# Patient Record
Sex: Male | Born: 1950 | Race: White | Hispanic: No | Marital: Married | Smoking: Never smoker
Health system: Southern US, Community
[De-identification: ages and names within clinical notes are randomized; demographics above are authoritative.]

## PROBLEM LIST (undated history)

## (undated) DIAGNOSIS — I712 Thoracic aortic aneurysm, without rupture: Secondary | ICD-10-CM

## (undated) DIAGNOSIS — K648 Other hemorrhoids: Secondary | ICD-10-CM

## (undated) DIAGNOSIS — M1990 Unspecified osteoarthritis, unspecified site: Secondary | ICD-10-CM

## (undated) DIAGNOSIS — N529 Male erectile dysfunction, unspecified: Secondary | ICD-10-CM

## (undated) DIAGNOSIS — H269 Unspecified cataract: Secondary | ICD-10-CM

## (undated) DIAGNOSIS — K5730 Diverticulosis of large intestine without perforation or abscess without bleeding: Secondary | ICD-10-CM

## (undated) DIAGNOSIS — I10 Essential (primary) hypertension: Secondary | ICD-10-CM

## (undated) DIAGNOSIS — I4891 Unspecified atrial fibrillation: Secondary | ICD-10-CM

## (undated) DIAGNOSIS — M19019 Primary osteoarthritis, unspecified shoulder: Secondary | ICD-10-CM

## (undated) DIAGNOSIS — N200 Calculus of kidney: Secondary | ICD-10-CM

## (undated) DIAGNOSIS — J61 Pneumoconiosis due to asbestos and other mineral fibers: Secondary | ICD-10-CM

## (undated) DIAGNOSIS — N492 Inflammatory disorders of scrotum: Secondary | ICD-10-CM

## (undated) DIAGNOSIS — R600 Localized edema: Secondary | ICD-10-CM

## (undated) DIAGNOSIS — I7121 Aneurysm of the ascending aorta, without rupture: Secondary | ICD-10-CM

## (undated) HISTORY — DX: Male erectile dysfunction, unspecified: N52.9

## (undated) HISTORY — DX: Diverticulosis of large intestine without perforation or abscess without bleeding: K57.30

## (undated) HISTORY — DX: Pneumoconiosis due to asbestos and other mineral fibers: J61

## (undated) HISTORY — PX: TONSILLECTOMY: SHX5217

## (undated) HISTORY — DX: Essential (primary) hypertension: I10

## (undated) HISTORY — DX: Calculus of kidney: N20.0

## (undated) HISTORY — DX: Inflammatory disorders of scrotum: N49.2

## (undated) HISTORY — DX: Unspecified atrial fibrillation: I48.91

## (undated) HISTORY — DX: Localized edema: R60.0

## (undated) HISTORY — DX: Other hemorrhoids: K64.8

---

## 1969-12-03 HISTORY — PX: CHOLECYSTECTOMY: SHX55

## 1989-08-03 HISTORY — PX: OTHER SURGICAL HISTORY: SHX169

## 1998-12-30 ENCOUNTER — Ambulatory Visit (HOSPITAL_COMMUNITY): Admission: RE | Admit: 1998-12-30 | Discharge: 1998-12-30 | Payer: Self-pay | Admitting: Internal Medicine

## 2002-01-31 DIAGNOSIS — N492 Inflammatory disorders of scrotum: Secondary | ICD-10-CM

## 2002-01-31 HISTORY — DX: Inflammatory disorders of scrotum: N49.2

## 2007-11-03 HISTORY — PX: OTHER SURGICAL HISTORY: SHX169

## 2008-01-05 ENCOUNTER — Ambulatory Visit: Payer: Self-pay | Admitting: Family Medicine

## 2008-01-05 DIAGNOSIS — L0390 Cellulitis, unspecified: Secondary | ICD-10-CM

## 2008-01-05 DIAGNOSIS — L0291 Cutaneous abscess, unspecified: Secondary | ICD-10-CM

## 2008-01-05 DIAGNOSIS — I872 Venous insufficiency (chronic) (peripheral): Secondary | ICD-10-CM

## 2008-01-05 DIAGNOSIS — I10 Essential (primary) hypertension: Secondary | ICD-10-CM

## 2008-01-06 ENCOUNTER — Encounter: Payer: Self-pay | Admitting: Family Medicine

## 2008-01-06 DIAGNOSIS — F528 Other sexual dysfunction not due to a substance or known physiological condition: Secondary | ICD-10-CM

## 2008-01-09 ENCOUNTER — Ambulatory Visit: Payer: Self-pay | Admitting: Family Medicine

## 2008-01-29 ENCOUNTER — Ambulatory Visit: Payer: Self-pay | Admitting: Family Medicine

## 2008-02-03 ENCOUNTER — Encounter: Payer: Self-pay | Admitting: Family Medicine

## 2008-02-13 ENCOUNTER — Telehealth: Payer: Self-pay | Admitting: Family Medicine

## 2008-03-16 ENCOUNTER — Ambulatory Visit: Payer: Self-pay | Admitting: Family Medicine

## 2008-03-19 ENCOUNTER — Telehealth: Payer: Self-pay | Admitting: Family Medicine

## 2008-03-26 ENCOUNTER — Telehealth: Payer: Self-pay | Admitting: Family Medicine

## 2008-04-12 ENCOUNTER — Ambulatory Visit: Payer: Self-pay | Admitting: Family Medicine

## 2008-04-27 ENCOUNTER — Ambulatory Visit: Payer: Self-pay | Admitting: Family Medicine

## 2008-04-27 ENCOUNTER — Encounter: Admission: RE | Admit: 2008-04-27 | Discharge: 2008-04-27 | Payer: Self-pay | Admitting: Family Medicine

## 2008-04-27 DIAGNOSIS — M25579 Pain in unspecified ankle and joints of unspecified foot: Secondary | ICD-10-CM

## 2008-04-29 LAB — CONVERTED CEMR LAB
ALT: 28 units/L (ref 0–53)
AST: 25 units/L (ref 0–37)
Cholesterol: 126 mg/dL (ref 0–200)
Creatinine, Ser: 0.93 mg/dL (ref 0.40–1.50)
HCT: 44 % (ref 39.0–52.0)
HDL: 40 mg/dL (ref >39)
MCV: 92.2 fL (ref 78.0–100.0)
Platelets: 249 10*3/uL (ref 150–400)
RDW: 13.1 % (ref 11.5–15.5)
TSH: 2.703 microintl units/mL (ref 0.350–5.50)
Total Bilirubin: 1.8 mg/dL — ABNORMAL HIGH (ref 0.3–1.2)
Total CHOL/HDL Ratio: 3.2
VLDL: 14 mg/dL (ref 0–40)

## 2008-05-10 ENCOUNTER — Ambulatory Visit: Payer: Self-pay | Admitting: Family Medicine

## 2008-05-10 DIAGNOSIS — E348 Other specified endocrine disorders: Secondary | ICD-10-CM | POA: Insufficient documentation

## 2008-05-25 ENCOUNTER — Telehealth: Payer: Self-pay | Admitting: Family Medicine

## 2008-05-26 ENCOUNTER — Ambulatory Visit: Payer: Self-pay | Admitting: Family Medicine

## 2008-05-26 DIAGNOSIS — I499 Cardiac arrhythmia, unspecified: Secondary | ICD-10-CM | POA: Insufficient documentation

## 2008-05-26 DIAGNOSIS — I498 Other specified cardiac arrhythmias: Secondary | ICD-10-CM

## 2008-05-27 ENCOUNTER — Encounter: Payer: Self-pay | Admitting: Family Medicine

## 2008-06-01 ENCOUNTER — Ambulatory Visit: Payer: Self-pay | Admitting: Family Medicine

## 2008-06-08 ENCOUNTER — Ambulatory Visit: Payer: Self-pay | Admitting: Family Medicine

## 2008-07-19 ENCOUNTER — Telehealth: Payer: Self-pay | Admitting: Family Medicine

## 2008-08-12 ENCOUNTER — Telehealth (INDEPENDENT_AMBULATORY_CARE_PROVIDER_SITE_OTHER): Payer: Self-pay | Admitting: *Deleted

## 2008-08-17 ENCOUNTER — Ambulatory Visit: Payer: Self-pay | Admitting: Family Medicine

## 2008-08-23 ENCOUNTER — Telehealth: Payer: Self-pay | Admitting: Family Medicine

## 2008-08-24 ENCOUNTER — Telehealth (INDEPENDENT_AMBULATORY_CARE_PROVIDER_SITE_OTHER): Payer: Self-pay | Admitting: *Deleted

## 2008-08-27 ENCOUNTER — Ambulatory Visit: Payer: Self-pay | Admitting: Family Medicine

## 2008-09-20 ENCOUNTER — Telehealth: Payer: Self-pay | Admitting: Family Medicine

## 2008-09-24 ENCOUNTER — Ambulatory Visit: Payer: Self-pay | Admitting: Family Medicine

## 2008-12-10 ENCOUNTER — Ambulatory Visit: Payer: Self-pay | Admitting: Family Medicine

## 2009-02-01 ENCOUNTER — Telehealth (INDEPENDENT_AMBULATORY_CARE_PROVIDER_SITE_OTHER): Payer: Self-pay | Admitting: Internal Medicine

## 2009-02-01 ENCOUNTER — Ambulatory Visit: Payer: Self-pay | Admitting: Family Medicine

## 2009-02-18 ENCOUNTER — Ambulatory Visit: Payer: Self-pay | Admitting: Family Medicine

## 2009-03-22 ENCOUNTER — Telehealth (INDEPENDENT_AMBULATORY_CARE_PROVIDER_SITE_OTHER): Payer: Self-pay | Admitting: *Deleted

## 2009-03-25 ENCOUNTER — Ambulatory Visit: Payer: Self-pay | Admitting: Family Medicine

## 2009-05-24 ENCOUNTER — Ambulatory Visit: Payer: Self-pay | Admitting: Family Medicine

## 2009-06-14 ENCOUNTER — Encounter: Payer: Self-pay | Admitting: Family Medicine

## 2009-06-15 ENCOUNTER — Ambulatory Visit: Payer: Self-pay | Admitting: Family Medicine

## 2009-06-15 LAB — CONVERTED CEMR LAB
Albumin: 3.8 g/dL (ref 3.5–5.2)
CO2: 24 meq/L (ref 19–32)
Calcium: 8.7 mg/dL (ref 8.4–10.5)
Chloride: 108 meq/L (ref 96–112)
Cholesterol: 117 mg/dL (ref 0–200)
Glucose, Bld: 108 mg/dL — ABNORMAL HIGH (ref 70–99)
Sodium: 144 meq/L (ref 135–145)
Total Bilirubin: 1.7 mg/dL — ABNORMAL HIGH (ref 0.3–1.2)
Total Protein: 6.4 g/dL (ref 6.0–8.3)
Triglycerides: 55 mg/dL (ref <150)
VLDL: 11 mg/dL (ref 0–40)

## 2009-11-07 ENCOUNTER — Ambulatory Visit: Payer: Self-pay | Admitting: Family Medicine

## 2009-11-14 ENCOUNTER — Ambulatory Visit: Payer: Self-pay | Admitting: Family Medicine

## 2009-11-22 ENCOUNTER — Ambulatory Visit: Payer: Self-pay | Admitting: Family Medicine

## 2010-03-07 ENCOUNTER — Ambulatory Visit: Payer: Self-pay | Admitting: Family Medicine

## 2010-03-08 ENCOUNTER — Telehealth: Payer: Self-pay | Admitting: Family Medicine

## 2010-03-10 ENCOUNTER — Encounter: Payer: Self-pay | Admitting: Family Medicine

## 2010-03-11 LAB — CONVERTED CEMR LAB
BUN: 22 mg/dL (ref 6–23)
CO2: 25 meq/L (ref 19–32)
Chloride: 107 meq/L (ref 96–112)
Glucose, Bld: 127 mg/dL — ABNORMAL HIGH (ref 70–99)
Potassium: 4.4 meq/L (ref 3.5–5.3)
Sodium: 140 meq/L (ref 135–145)

## 2010-07-27 ENCOUNTER — Telehealth (INDEPENDENT_AMBULATORY_CARE_PROVIDER_SITE_OTHER): Payer: Self-pay | Admitting: *Deleted

## 2010-07-27 ENCOUNTER — Ambulatory Visit: Payer: Self-pay | Admitting: Family Medicine

## 2010-07-31 ENCOUNTER — Telehealth: Payer: Self-pay | Admitting: Family Medicine

## 2010-09-11 ENCOUNTER — Telehealth: Payer: Self-pay | Admitting: Family Medicine

## 2010-10-18 ENCOUNTER — Ambulatory Visit: Payer: Self-pay | Admitting: Family Medicine

## 2010-10-19 ENCOUNTER — Encounter: Payer: Self-pay | Admitting: Family Medicine

## 2010-10-30 ENCOUNTER — Encounter (INDEPENDENT_AMBULATORY_CARE_PROVIDER_SITE_OTHER): Payer: Self-pay | Admitting: *Deleted

## 2011-01-02 ENCOUNTER — Encounter: Payer: Self-pay | Admitting: Family Medicine

## 2011-01-02 NOTE — Assessment & Plan Note (Signed)
Summary: Abscess, Venous stasis   Vital Signs:  Patient profile:   60 year old male Height:      73.3 inches Weight:      304 pounds Pulse rate:   60 / minute BP sitting:   129 / 69  (left arm) Cuff size:   large  Vitals Entered By: Kathlene November (March 07, 2010 3:24 PM) CC: red area on lower left side of abdomen present for 1 week   Primary Care Provider:  Nani Gasser MD  CC:  red area on lower left side of abdomen present for 1 week.  History of Present Illness: red area on lower left side of abdomen present for 1 week. Has drained some adn has squeezed it some. No fever.  Also his right leg has been very swollena nd now getting ulcerations. Thinks he may need an UNA boot again.  Has been taking his diuretic everyday.    Current Medications (verified): 1)  Fexofenadine Hcl 180 Mg  Tabs (Fexofenadine Hcl) .... Take 1 Tablet By Mouth Once A Day 2)  Furosemide 20 Mg  Tabs (Furosemide) .... Take 1 Tablet By Mouth Two Times A Day 3)  Tramadol Hcl 50 Mg  Tabs (Tramadol Hcl) .... Take 1 Tablet By Mouth Three Times A Day As Needed Pain 4)  Lisinopril 40 Mg  Tabs (Lisinopril) .... Take 1 Tablet By Mouth Once A Day 5)  Triamcinolone Acetonide 0.5 %  Crea (Triamcinolone Acetonide) .... Apply Two Times A Day To Affected Area 6)  Levitra 10 Mg  Tabs (Vardenafil Hcl) .... Take 1 Tablet By Mouth Once A Day 7)  Azor 10-40 Mg Tabs (Amlodipine-Olmesartan) .... Take 1 Tablet By Mouth Once A Day 8)  Coreg Cr 40 Mg Xr24h-Cap (Carvedilol Phosphate) .... Take 1 Tablet By Mouth Once A Day  Allergies (verified): 1)  ! Pcn  Comments:  Nurse/Medical Assistant: The patient's medications and allergies were reviewed with the patient and were updated in the Medication and Allergy Lists. Kathlene November (March 07, 2010 3:25 PM)  Physical Exam  General:  Well-developed,well-nourished,in no acute distress; alert,appropriate and cooperative throughout examination Msk:  Right leg wtih thick induration of  the lwer leg 1+ pitting edema adn a few approx 1cm ulceration.  Skin:  Right lower abdomen wiht 1 cm abscess with surrounding erythema about 10 cm around the border of eh abscess.    Impression & Recommendations:  Problem # 1:  ABSCESS, SKIN (ICD-682.9) I & Ds.  F/U wound car reveiwed. STart bactrim tonight. REmove packing 1 inch per day.  His updated medication list for this problem includes:    Bactrim Ds 800-160 Mg Tabs (Sulfamethoxazole-trimethoprim) .Marland Kitchen... Take 2 tablet by mouth two times a day for 10 days.  Orders: I&D Abscess, Simple / Single (10060)  Problem # 2:  UNSPECIFIED VENOUS INSUFFICIENCY (ICD-459.81) Will increase his diuretic but will need ti go in 2-3 days after increaseing to check his electrolytes to make sure stable. UNA boot applied today in the office. F/u in 1 week.  Orders: Henriette Combs, Application of ([address]) T-Basic Metabolic Panel [phone])  Complete Medication List: 1)  Fexofenadine Hcl 180 Mg Tabs (Fexofenadine hcl) .... Take 1 tablet by mouth once a day 2)  Furosemide 40 Mg Tabs (Furosemide) .... Take one a day or up to 2 x a day if needed for swelling. 3)  Tramadol Hcl 50 Mg Tabs (Tramadol hcl) .... Take 1 tablet by mouth three times a day as needed pain  4)  Lisinopril 40 Mg Tabs (Lisinopril) .... Take 1 tablet by mouth once a day 5)  Triamcinolone Acetonide 0.5 % Crea (Triamcinolone acetonide) .... Apply two times a day to affected area 6)  Levitra 10 Mg Tabs (Vardenafil hcl) .... Take 1 tablet by mouth once a day 7)  Azor 10-40 Mg Tabs (Amlodipine-olmesartan) .... Take 1 tablet by mouth once a day 8)  Coreg Cr 40 Mg Xr24h-cap (Carvedilol phosphate) .... Take 1 tablet by mouth once a day 9)  Bactrim Ds 800-160 Mg Tabs (Sulfamethoxazole-trimethoprim) .... Take 2 tablet by mouth two times a day for 10 days. 10)  Potassium Chloride Cr 10 Meq Cr-caps (Potassium chloride) .... Take 1 tablet by mouth once a day with fluid pill.  Patient Instructions: 1)   Increased your fluid pill but will need to start taking potassium pill with this. BOth sent to your pharmacy. Then recheck lab at the end of the week to make sure not losing too much potassium.   2)  Antibiotic for your abscess sent to the pharmacy.  3)  Follow up in one week to recheck your leg.  Prescriptions: POTASSIUM CHLORIDE CR 10 MEQ CR-CAPS (POTASSIUM CHLORIDE) Take 1 tablet by mouth once a day with fluid pill.  #30 x 0   Entered and Authorized by:   Nani Gasser MD   Signed by:   Nani Gasser MD on 03/07/2010   Method used:   Electronically to        [facility] (retail)       [address]       [place], [place]  [address]       Ph: [phone]       Fax: [phone]   RxID:   [id number] BACTRIM DS 800-160 MG TABS (SULFAMETHOXAZOLE-TRIMETHOPRIM) Take 2 tablet by mouth two times a day for 10 days.  #40 x 0   Entered and Authorized by:   Nani Gasser MD   Signed by:   Nani Gasser MD on 03/07/2010   Method used:   Electronically to        [facility] ([facility]ail)       [address]       [place], [place]  [address]       Ph: [phone]       Fax: [phone]   RxID:   [id number] FUROSEMIDE 40 MG TABS (FUROSEMIDE) Take one a day or up to 2 x a day if needed for swelling.  #60 x 1   Entered and Authorized by:   Nani Gasser MD   Signed by:   Nani Gasser MD on 03/07/2010   Method used:   Electronically to        [facility] ([facility]       [address]       [place], [place]  [address]       Ph: [phone]       Fax: [phone]   RxID:   [id number]    Procedure Note Last Tetanus: Tdap (06/15/2009)  Incision & Drainage: Date of onset: 03/02/2010 Indication: infected lesion  Procedure # 1: I & D with packing    Size (in cm): 2.0  x 3.0    Region: Left lower abdomen     Comment: Lidocaine with epi [id number]  Exp.03/2011    Instrument used: #11 blade    Anesthesia: 1% lidocaine  w/epinephrine  Cleaned and prepped with: sterile H20 and betadine Wound dressing:  bacitracin and bulky gauze dressing Additional Instructions: F/u wound care reviewed.

## 2011-01-02 NOTE — Assessment & Plan Note (Signed)
Summary: Legs are swollen, hurt and have sores   Vital Signs:  Patient profile:   60 year old male Height:      73.3 inches Weight:      285 pounds Pulse rate:   78 / minute BP sitting:   147 / 75  (left arm) Cuff size:   large  Vitals Entered By: Avon Gully CMA, Duncan Dull) (July 27, 2010 11:40 AM) CC: legs swollen    Primary Care Jerome Hicks:  Nani Gasser MD  CC:  legs swollen.  History of Present Illness: Has lost 20 lbs. Swelling worse as the day goes ont. 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  2 lesion on his left leg are drianing. Taking his lasix daily.   Current Medications (verified): 1)  Fexofenadine Hcl 180 Mg  Tabs (Fexofenadine Hcl) .... Take 1 Tablet By Mouth Once A Day 2)  Furosemide 40 Mg Tabs (Furosemide) .... Take One A Day or Up To 2 X A Day If Needed For Swelling. 3)  Tramadol Hcl 50 Mg  Tabs (Tramadol Hcl) .... Take 1 Tablet By Mouth Three Times A Day As Needed Pain 4)  Lisinopril 40 Mg  Tabs (Lisinopril) .... Take 1 Tablet By Mouth Once A Day 5)  Triamcinolone Acetonide 0.5 %  Crea (Triamcinolone Acetonide) .... Apply Two Times A Day To Affected Area 6)  Levitra 10 Mg  Tabs (Vardenafil Hcl) .... Take 1 Tablet By Mouth Once A Day 7)  Azor 10-40 Mg Tabs (Amlodipine-Olmesartan) .... Take 1 Tablet By Mouth Once A Day 8)  Coreg Cr 40 Mg Xr24h-Cap (Carvedilol Phosphate) .... Take 1 Tablet By Mouth Once A Day 9)  Potassium Chloride Cr 10 Meq Cr-Caps (Potassium Chloride) .... Take 1 Tablet By Mouth Once A Day With Fluid Pill.  Allergies (verified): 1)  ! Pcn  Comments:  Nurse/Medical Assistant: The patient's medications and allergies were reviewed with the patient and were updated in the Medication and Allergy Lists. Avon Gully CMA, Duncan Dull) (July 27, 2010 11:41 AM)  Physical Exam  General:  Well-developed,well-nourished,in no acute distress; alert,appropriate and cooperative  throughout examination Head:  Normocephalic and atraumatic without obvious abnormalities. No apparent alopecia or balding. Extremities:  2+ edema to belwoe the knee bilat. Has 2 draingin lesion on hisleft lower leg with spreading erythema.  No actual abscess to drain.    Impression & Recommendations:  Problem # 1:  CELLULITIS, LEG, LEFT (ICD-682.6) Will tx wtih bactrim.  Continue lasix and elevate legs as much as possible. Congratulated him on his weigh loss as this should help his venous stasis.  F/u if not resolving. If not healing then may need UNA boots.  Also discussed getting some compression socks since he really doesn't want to wear the compression stockings. He says he meay try them.  His updated medication list for this problem includes:    Bactrim Ds 800-160 Mg Tabs (Sulfamethoxazole-trimethoprim) .Marland Kitchen... Take 2 tablet by mouth two times a day for 10 days.  Problem # 2:  UNSPECIFIED VENOUS INSUFFICIENCY (ICD-459.81) Continue lasix and elevate legs as much as possible. Congratulated him on his weigh loss as this should help his venous stasis.  F/u if not resolving. Also discussed getting some compression socks since he really doesn't want to wear the compression stockings. He says he meay try them.   Problem # 3:  ESSENTIAL HYPERTENSION, BENIGN (ICD-401.1) Azor is now too expensive. Will try  changint to exforge. If still expensive then recommend change to losaratana and amlodipine separately.  His updated medication list for this problem includes:    Furosemide 40 Mg Tabs (Furosemide) .Marland Kitchen... Take one a day or up to 2 x a day if needed for swelling.    Lisinopril 40 Mg Tabs (Lisinopril) .Marland Kitchen... Take 1 tablet by mouth once a day    Exforge 10-160 Mg Tabs (Amlodipine besylate-valsartan) .Marland Kitchen... Take 1 tablet by mouth once a day    Coreg Cr 40 Mg Xr24h-cap (Carvedilol phosphate) .Marland Kitchen... Take 1 tablet by mouth once a day  Complete Medication List: 1)  Fexofenadine Hcl 180 Mg Tabs (Fexofenadine  hcl) .... Take 1 tablet by mouth once a day 2)  Furosemide 40 Mg Tabs (Furosemide) .... Take one a day or up to 2 x a day if needed for swelling. 3)  Tramadol Hcl 50 Mg Tabs (Tramadol hcl) .... Take 1 tablet by mouth three times a day as needed pain 4)  Lisinopril 40 Mg Tabs (Lisinopril) .... Take 1 tablet by mouth once a day 5)  Triamcinolone Acetonide 0.5 % Crea (Triamcinolone acetonide) .... Apply two times a day to affected area 6)  Levitra 10 Mg Tabs (Vardenafil hcl) .... Take 1 tablet by mouth once a day 7)  Exforge 10-160 Mg Tabs (Amlodipine besylate-valsartan) .... Take 1 tablet by mouth once a day 8)  Coreg Cr 40 Mg Xr24h-cap (Carvedilol phosphate) .... Take 1 tablet by mouth once a day 9)  Bactrim Ds 800-160 Mg Tabs (Sulfamethoxazole-trimethoprim) .... Take 2 tablet by mouth two times a day for 10 days. 10)  Potassium Chloride Cr 10 Meq Cr-caps (Potassium chloride) .... Take 1 tablet by mouth once a day with fluid pill.  Patient Instructions: 1)    Follow up for 2 montns for blood pressure.  2)  Call if the exforge is too expensive 3)  Can try the compression stockings at [facility].  Prescriptions: EXFORGE 10-160 MG TABS (AMLODIPINE BESYLATE-VALSARTAN) Take 1 tablet by mouth once a day  #30 x 0   Entered and Authorized by:   Nani Gasser MD   Signed by:   Nani Gasser MD on 07/27/2010   Method used:   Electronically to        [facility]* (retail)       [address] [place], [place]  [address]       Ph: [phone]       Fax: [phone]   RxID:   [id number] BACTRIM DS 800-160 MG TABS (SULFAMETHOXAZOLE-TRIMETHOPRIM) Take 2 tablet by mouth two times a day for 10 days.  #40 x 0   Entered and Authorized by:   Nani Gasser MD   Signed by:   Nani Gasser MD on 07/27/2010   Method used:   Electronically to        [facility]* (retail)       [address] [place], [place]  [address]       Ph: [phone]       Fax: [phone]    RxID:   [id number]

## 2011-01-02 NOTE — Letter (Signed)
Summary: Generic Letter  [facility]  [address]   [place], [place] [address]   Phone: [phone]  Fax: [phone]    10/30/2010  Jerome Hicks [address] [place], [place]  [address]  Dear Jerome Hicks,    We have been unable to reach you by phone. Your results showed that you have MRSA. Please start using your Hibiclens in the shower.       Sincerely,   Nani Gasser, MD

## 2011-01-02 NOTE — Progress Notes (Signed)
Summary: meds  Phone Note Call from Patient   Caller: Patient Summary of Call: Dr.Metheney   Call Back  [phone]  Patient was told to call back if the new meds that was called in...he said it cost to much and he want to know what to do.  Initial call taken by: Vanessa Swaziland,  July 31, 2010 1:42 PM  Follow-up for Phone Call        OK, split the med up so will have 2 rx.  Follow-up by: Nani Gasser MD,  July 31, 2010 2:16 PM  Additional Follow-up for Phone Call Additional follow up Details #1::        Pt notifeid med sent to pharmacy Additional Follow-up by: Kathlene November,  July 31, 2010 2:19 PM    New/Updated Medications: AMLODIPINE BESYLATE 10 MG TABS (AMLODIPINE BESYLATE) Take 1 tablet by mouth once a day LOSARTAN POTASSIUM 100 MG TABS (LOSARTAN POTASSIUM) Take 1 tablet by mouth once a day Prescriptions: LOSARTAN POTASSIUM 100 MG TABS (LOSARTAN POTASSIUM) Take 1 tablet by mouth once a day  #30 x 1   Entered and Authorized by:   Nani Gasser MD   Signed by:   Nani Gasser MD on 07/31/2010   Method used:   Electronically to        [facility]* (retail)       [address] [place], [place]  [address]       Ph: [phone]       Fax: [phone]   RxID:   [id number] AMLODIPINE BESYLATE 10 MG TABS (AMLODIPINE BESYLATE) Take 1 tablet by mouth once a day  #30 x 1   Entered and Authorized by:   Nani Gasser MD   Signed by:   Nani Gasser MD on 07/31/2010   Method used:   Electronically to        [facility]* (retail)       [address] [place], [place]  [address]       Ph: [phone]       Fax: [phone]   RxID:   [id number]

## 2011-01-02 NOTE — Progress Notes (Signed)
Summary: form  Phone Note Call from Patient Call back at [phone]   Caller: Daughter Summary of Call: called and states they dropped off a form for pt and it was not complete. no signature was on it wanted to know if there was any reason why it wasnt filled out or did it get overlooked Initial call taken by: Avon Gully CMA, Duncan Dull),  September 11, 2010 10:14 AM  Follow-up for Phone Call        I can't complete the form.  Follow-up by: Nani Gasser MD,  September 11, 2010 10:25 AM  Additional Follow-up for Phone Call Additional follow up Details #1::        this is the form that says on the front do not have your provider fill this out.called and left message for daughter Additional Follow-up by: Avon Gully CMA, Duncan Dull),  September 11, 2010 10:37 AM

## 2011-01-02 NOTE — Assessment & Plan Note (Signed)
Summary: Edema, abscess   Vital Signs:  Patient profile:   60 year old male Height:      73.3 inches Weight:      295 pounds Pulse rate:   70 / minute BP sitting:   138 / 68  (right arm) Cuff size:   large  Vitals Entered By: Avon Gully CMA, Duncan Dull) (October 18, 2010 8:47 AM) CC: MRSA rt inner leg   Primary Care Cyana Shook:  Nani Gasser MD  CC:  MRSA rt inner leg.  History of Present Illness: Legs have been much more swollen lately.  Has been taking his lasix two times a day and feels he has been getting a good response from it. Denies any fever. Now the skin is red and there are some lesions that are draining.  Teh area is tender.    Allergies: 1)  ! Pcn  Physical Exam  General:  Well-developed,well-nourished,in no acute distress; alert,appropriate and cooperative throughout examination Skin:  2+ pitting edema from the knees down.  His lower legs have a peau d'orange appearance and has some scattered pustules over this area.  A culture was obtained.    Impression & Recommendations:  Problem # 1:  UNSPECIFIED VENOUS INSUFFICIENCY (ICD-459.81) Assessment Deteriorated Give 80mg  of IM lasix today in the office. Increase oral lasix to three times a day for 4 days and then go back to two times a day REally watch the salt in your diet.  F/U in one week to recheck.   Problem # 2:  ABSCESS, SKIN (ICD-682.9) Assessment: Deteriorated  Culture was obtained. Tx with bactrim. Also given bactroban to use on areas of broken skin.  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  His updated medication list for this problem includes:    Bactrim Ds 800-160 Mg Tabs (Sulfamethoxazole-trimethoprim) .Marland Kitchen... Take 2 tablet by mouth two times a day for 10 days.  Orders: T-Culture,Abcess (87070/[id number])  Complete Medication List: 1)  Fexofenadine Hcl 180 Mg Tabs (Fexofenadine hcl) .... Take 1 tablet by mouth once a day 2)  Furosemide 40 Mg Tabs (Furosemide) .... Take one a day or  up to 2 x a day if needed for swelling. 3)  Tramadol Hcl 50 Mg Tabs (Tramadol hcl) .... Take 1 tablet by mouth three times a day as needed pain 4)  Lisinopril 40 Mg Tabs (Lisinopril) .... Take 1 tablet by mouth once a day 5)  Triamcinolone Acetonide 0.5 % Crea (Triamcinolone acetonide) .... Apply two times a day to affected area 6)  Levitra 10 Mg Tabs (Vardenafil hcl) .... Take 1 tablet by mouth once a day 7)  Amlodipine Besylate 10 Mg Tabs (Amlodipine besylate) .... Take 1 tablet by mouth once a day 8)  Coreg Cr 40 Mg Xr24h-cap (Carvedilol phosphate) .... Take 1 tablet by mouth once a day 9)  Bactrim Ds 800-160 Mg Tabs (Sulfamethoxazole-trimethoprim) .... Take 2 tablet by mouth two times a day for 10 days. 10)  Potassium Chloride Cr 10 Meq Cr-caps (Potassium chloride) .... Take 1 tablet by mouth once a day with fluid pill. 11)  Losartan Potassium 100 Mg Tabs (Losartan potassium) .... Take 1 tablet by mouth once a day 12)  Bactroban 2 % Oint (Mupirocin) .... Apply two times a day to affected area on skin  Other Orders: Admin of Therapeutic Inj ([facility] or [facility]) ([address]) Flu Vaccine 47yrs + ([address]) Admin 1st Vaccine ([address])  Patient Instructions: 1)   Increase oral lasix to three times a day for 4 days  and then go back to two times a day. 2)  REally what the salt in your diet.   3)  Call if not improving by Friday.  4)  Can use the ointment on teh areas of broken skin. 5)  Can also use ACE wraps for compression to help promote healing.  6)  Follow up in one week.  Prescriptions: BACTROBAN 2 % OINT (MUPIROCIN) Apply two times a day to affected area on skin  #1 tube x 1   Entered and Authorized by:   Nani Gasser MD   Signed by:   Nani Gasser MD on 10/18/2010   Method used:   Electronically to        [facility]* (retail)       [address] [place], [place]  [address]       Ph: [phone]       Fax: [phone]   RxID:   [id number] BACTRIM DS 800-160 MG TABS  (SULFAMETHOXAZOLE-TRIMETHOPRIM) Take 2 tablet by mouth two times a day for 10 days.  #40 x 0   Entered and Authorized by:   Nani Gasser MD   Signed by:   Nani Gasser MD on 10/18/2010   Method used:   Electronically to        [facility]* (retail)       [address] [place], [place]  [address]       Ph: [phone]       Fax: [phone]   RxID:   [id number]    Medication Administration  Injection # 1:    Medication: Furosemide- Lasix Injection    Route: IM    Site: RUOQ gluteus    Exp Date: 06/03/2011    Lot #: [id number]    Mfr: Hospira    Comments: 80 mg/ml given    Patient tolerated injection without complications    Given by: Avon Gully CMA, Duncan Dull) (October 18, 2010 8:49 AM)  Orders Added: 1)  T-Culture,Abcess [87070/[id number]] 2)  Admin of Therapeutic Inj ([facility] or [facility]) [[address]] 3)  Flu Vaccine 85yrs + [[address]] 4)  Admin 1st Vaccine [90471] 5)  Est. Patient Level IV [26948]   Immunizations Administered:  Influenza Vaccine # 1:    Vaccine Type: Fluvax 3+    Site: left deltoid    Mfr: GlaxoSmithKline    Dose: 0.5 ml    Route: IM    Given by: Sue Lush McCrimmon CMA, (AAMA)    Exp. Date: 06/02/2011    Lot #: [id number]    VIS given: 06/27/10 version given October 18, 2010.  Flu Vaccine Consent Questions:    Do you have a history of severe allergic reactions to this vaccine? no    Any prior history of allergic reactions to egg and/or gelatin? no    Do you have a sensitivity to the preservative Thimersol? no    Do you have a past history of Guillan-Barre Syndrome? no    Do you currently have an acute febrile illness? no    Have you ever had a severe reaction to latex? no    Vaccine information given and explained to patient? no   Immunizations Administered:  Influenza Vaccine # 1:    Vaccine Type: Fluvax 3+    Site: left deltoid    Mfr: GlaxoSmithKline    Dose: 0.5 ml    Route: IM    Given by: Avon Gully CMA, (AAMA)  Exp. Date: 06/02/2011    Lot #: [id number]    VIS given: 06/27/10 version given October 18, 2010.  Appended Document: Edema, abscess HPI: No trauma. No CP or SOB.

## 2011-01-02 NOTE — Progress Notes (Signed)
  Request recieved from [facility]DS sent to [facility]. Cala Bradford Mesiemore  July 27, 2010 8:44 AM

## 2011-01-02 NOTE — Progress Notes (Signed)
Summary: Remove packing?  Phone Note Call from Patient   Caller: Patient Summary of Call: Pt is doing well and having no problems. He would just like to know when he should remove packing.  Initial call taken by: Payton Spark CMA,  March 08, 2010 4:11 PM  Follow-up for Phone Call        REmove 1 inch a day until completely out.  Remind him to leave a tail on the string so he can easily pull on it the next day.  Follow-up by: Nani Gasser MD,  March 09, 2010 7:57 AM  Additional Follow-up for Phone Call Additional follow up Details #1::        Pt notified Additional Follow-up by: Kathlene November,  March 09, 2010 8:05 AM

## 2011-01-12 DIAGNOSIS — J61 Pneumoconiosis due to asbestos and other mineral fibers: Secondary | ICD-10-CM | POA: Insufficient documentation

## 2011-01-12 DIAGNOSIS — J984 Other disorders of lung: Secondary | ICD-10-CM | POA: Insufficient documentation

## 2011-01-30 NOTE — Letter (Signed)
Summary: [name]. Valorie Roosevelt, MD  [name]. Valorie Roosevelt, MD   Imported By: Maryln Gottron 01/22/2011 12:53:23  _____________________________________________________________________  External Attachment:    Type:   Image     Comment:   External Document

## 2011-03-16 ENCOUNTER — Encounter: Payer: Self-pay | Admitting: Family Medicine

## 2011-03-16 ENCOUNTER — Ambulatory Visit: Payer: Self-pay | Admitting: Family Medicine

## 2011-03-22 ENCOUNTER — Telehealth: Payer: Self-pay | Admitting: Family Medicine

## 2011-03-22 ENCOUNTER — Encounter: Payer: Self-pay | Admitting: Family Medicine

## 2011-03-22 ENCOUNTER — Ambulatory Visit (INDEPENDENT_AMBULATORY_CARE_PROVIDER_SITE_OTHER): Payer: Self-pay | Admitting: Family Medicine

## 2011-03-22 VITALS — BP 169/82 | HR 61 | Ht 73.3 in | Wt 290.0 lb

## 2011-03-22 DIAGNOSIS — E348 Other specified endocrine disorders: Secondary | ICD-10-CM

## 2011-03-22 DIAGNOSIS — L0390 Cellulitis, unspecified: Secondary | ICD-10-CM

## 2011-03-22 DIAGNOSIS — I878 Other specified disorders of veins: Secondary | ICD-10-CM

## 2011-03-22 DIAGNOSIS — I10 Essential (primary) hypertension: Secondary | ICD-10-CM

## 2011-03-22 DIAGNOSIS — L0291 Cutaneous abscess, unspecified: Secondary | ICD-10-CM

## 2011-03-22 DIAGNOSIS — I872 Venous insufficiency (chronic) (peripheral): Secondary | ICD-10-CM

## 2011-03-22 MED ORDER — AMLODIPINE-OLMESARTAN 10-40 MG PO TABS: 1.0000 | ORAL_TABLET | Freq: Every day | ORAL | Status: DC

## 2011-03-22 MED ORDER — TRAMADOL HCL 50 MG PO TABS: 50.0000 mg | ORAL_TABLET | Freq: Three times a day (TID) | ORAL | Status: DC | PRN

## 2011-03-22 MED ORDER — DOXAZOSIN MESYLATE 4 MG PO TABS: 4.0000 mg | ORAL_TABLET | Freq: Every day | ORAL | Status: DC

## 2011-03-22 MED ORDER — SULFAMETHOXAZOLE-TMP DS 800-160 MG PO TABS: 1.0000 | ORAL_TABLET | Freq: Two times a day (BID) | ORAL | Status: DC

## 2011-03-22 MED ORDER — FUROSEMIDE 40 MG PO TABS: 80.0000 mg | ORAL_TABLET | ORAL | Status: DC

## 2011-03-22 MED ORDER — POTASSIUM CHLORIDE ER 10 MEQ PO TBCR: 10.0000 meq | EXTENDED_RELEASE_TABLET | Freq: Every day | ORAL | Status: DC

## 2011-03-22 MED ORDER — CARVEDILOL PHOSPHATE ER 40 MG PO CP24: 40.0000 mg | ORAL_CAPSULE | Freq: Every day | ORAL | Status: DC

## 2011-03-22 NOTE — Telephone Encounter (Signed)
[facility] called request to know if Coreg medication can be changed to generic Coreg because the original request is too expensive.

## 2011-03-22 NOTE — Assessment & Plan Note (Addendum)
He is unsure about exactly what he is taking so I gave him an updated list today and he will call me tomorrow nad let me know exactly what he is taking so we can adjust his meds as he is clearly not well controlled today. Will have him f/u in one month for a CPE. Consider adding cardura.

## 2011-03-22 NOTE — Assessment & Plan Note (Signed)
Recheck. He has lost weight so hopefully this feels better.

## 2011-03-22 NOTE — Assessment & Plan Note (Signed)
Continue 80mg  of lasix daily. I don't feel his is volume overloaded. He is doing well and has actually lost weight.  Recommend ACE wrap for compression on the right lower leg for 10 days.

## 2011-03-22 NOTE — Telephone Encounter (Signed)
Yes Ok for generic.

## 2011-03-22 NOTE — Progress Notes (Signed)
  Subjective:    Patient ID: Jerome Hicks, male    DOB: 11-09-1951, 60 y.o.   MRN: [id number]  Hypertension This is a chronic problem. The current episode started more than 1 year ago. The problem has been gradually worsening since onset. Associated symptoms include peripheral edema. Pertinent negatives include no blurred vision, chest pain, palpitations or shortness of breath. There are no associated agents to hypertension. Risk factors for coronary artery disease include sedentary lifestyle, male gender and obesity. Past treatments include angiotensin blockers, diuretics and calcium channel blockers. The current treatment provides no improvement. There are no compliance problems.      RAsh and drainaing lesion on right lower legs for months  He says the leg is now very tender and sore  Also more swollen than his other legs.   + hx of mRSA.  Has been trying to lose weight. Using neosporin on it every other day for the last week.  Says he thinks it may be helping. No worsening sxs.  He is taking his meds including his lasix regularly.     Review of Systems  Eyes: Negative for blurred vision.  Respiratory: Negative for shortness of breath.   Cardiovascular: Negative for chest pain and palpitations.       Objective:   Physical Exam  Constitutional: He appears well-developed and well-nourished.  HENT:  Head: Normocephalic.  Cardiovascular: Normal rate, regular rhythm and normal heart sounds.   Pulmonary/Chest: Effort normal and breath sounds normal.  Musculoskeletal:       Right lower extremity with 1+ edema to the knee.  He has several white papules with surrounding erythema.  He left lower leg has 1+ edema to the mid -tibia with slight puprle discoloration.           Assessment & Plan:

## 2011-03-22 NOTE — Patient Instructions (Signed)
Stop th losartan and lisinopril  Take the Azor 10/40 and I will send over a new medication called cardura to help lower your blood pressure.

## 2011-03-23 ENCOUNTER — Telehealth: Payer: Self-pay | Admitting: *Deleted

## 2011-03-23 LAB — LIPID PANEL
HDL: 40 mg/dL (ref >39)
LDL Cholesterol: 74 mg/dL (ref 0–99)
Triglycerides: 46 mg/dL (ref <150)

## 2011-03-23 MED ORDER — CARVEDILOL 25 MG PO TABS: 25.0000 mg | ORAL_TABLET | Freq: Two times a day (BID) | ORAL | Status: DC

## 2011-03-23 NOTE — Telephone Encounter (Signed)
Outpatient: The wound culture was negative but please continue the antibiotic as well as wearing Ace wrap.

## 2011-03-23 NOTE — Telephone Encounter (Signed)
Change to Coreg 25mg  bid.

## 2011-03-23 NOTE — Telephone Encounter (Signed)
[facility] called and states the pt cannot afford coreg cr and the reg coreg comes in 12.5-25, 3.125 and 6.25 but not a 40mg  table which is what was rx'ed.Please [phone])

## 2011-03-24 LAB — COMPLETE METABOLIC PANEL WITH GFR
BUN: 12 mg/dL (ref 6–23)
CO2: 24 mEq/L (ref 19–32)
Calcium: 9.1 mg/dL (ref 8.4–10.5)
Chloride: 105 mEq/L (ref 96–112)
Creat: 0.9 mg/dL (ref 0.40–1.50)
GFR, Est African American: >60 mL/min (ref >60)
GFR, Est Non African American: >60 mL/min (ref >60)
Glucose, Bld: 116 mg/dL — ABNORMAL HIGH (ref 70–99)
Total Bilirubin: 2.2 mg/dL — ABNORMAL HIGH (ref 0.3–1.2)

## 2011-03-25 ENCOUNTER — Telehealth: Payer: Self-pay | Admitting: Family Medicine

## 2011-03-25 LAB — WOUND CULTURE
Gram Stain: NONE SEEN
Gram Stain: NONE SEEN
Gram Stain: NONE SEEN

## 2011-03-25 NOTE — Telephone Encounter (Signed)
Call pt: Wound grew out MRSA>

## 2011-03-26 NOTE — Telephone Encounter (Signed)
rx has been called in

## 2011-03-26 NOTE — Telephone Encounter (Signed)
rx has been changed to 25mg 

## 2011-03-26 NOTE — Telephone Encounter (Signed)
Pt notified via vm

## 2011-03-29 ENCOUNTER — Ambulatory Visit (INDEPENDENT_AMBULATORY_CARE_PROVIDER_SITE_OTHER): Payer: Self-pay | Admitting: Family Medicine

## 2011-03-29 DIAGNOSIS — R42 Dizziness and giddiness: Secondary | ICD-10-CM

## 2011-03-29 DIAGNOSIS — I10 Essential (primary) hypertension: Secondary | ICD-10-CM

## 2011-03-29 MED ORDER — MECLIZINE HCL 25 MG PO TABS: 25.0000 mg | ORAL_TABLET | Freq: Three times a day (TID) | ORAL | Status: DC | PRN

## 2011-03-29 MED ORDER — FUROSEMIDE 40 MG PO TABS: 40.0000 mg | ORAL_TABLET | ORAL | Status: DC

## 2011-03-29 NOTE — Patient Instructions (Signed)
Let me know if you are not better in 2 weeks Can start the exercises.   Skip your lasix today, then drop to only one a day.

## 2011-03-29 NOTE — Assessment & Plan Note (Signed)
Decrease Lasix to one tab and decrease Coreg to once a day.

## 2011-03-29 NOTE — Progress Notes (Signed)
  Subjective:    Patient ID: Jerome Hicks, male    DOB: 23-Nov-1951, 60 y.o.   MRN: [id number]  HPI It started about 5 days ago and feels "dizzy headed".  BEtter when he sits still. Worse when he moves. Yesterday broke out in a cold sweats. No fever or ear pain.  No sinus pressure or cold symptomsl. Tried an OTC product that he feels did help his sxs some.    Review of Systems     Objective:   Physical Exam  Constitutional: He is oriented to person, place, and time. He appears well-developed and well-nourished.  HENT:  Head: Normocephalic and atraumatic.  Right Ear: External ear normal.  Left Ear: External ear normal.  Nose: Nose normal.  Mouth/Throat: Oropharynx is clear and moist.  Eyes: Conjunctivae and EOM are normal. Pupils are equal, round, and reactive to light.  Neck: Neck supple. No thyromegaly present.  Cardiovascular: Normal rate, regular rhythm and normal heart sounds.   Pulmonary/Chest: Effort normal and breath sounds normal.  Musculoskeletal: He exhibits edema.  Lymphadenopathy:    He has no cervical adenopathy.  Neurological: He is oriented to person, place, and time. He has normal reflexes. No cranial nerve deficit. Coordination normal.       Positive Dix-Hallpike to the right  Skin: Skin is warm and dry.  Psychiatric: He has a normal mood and affect.          Assessment & Plan:  Vertigo. - reviewed dx.  Given H.O. Demonstrated exercises. Meclizine sent to pharm. Call if not better in 2 weeks.   BP is low. Dec lasix to once a day and dec coreg to once a day. Has f/u in 2 weeks.

## 2011-04-14 ENCOUNTER — Encounter: Payer: Self-pay | Admitting: Family Medicine

## 2011-04-16 ENCOUNTER — Ambulatory Visit (INDEPENDENT_AMBULATORY_CARE_PROVIDER_SITE_OTHER): Payer: Self-pay | Admitting: Family Medicine

## 2011-04-16 ENCOUNTER — Encounter: Payer: Self-pay | Admitting: Family Medicine

## 2011-04-16 DIAGNOSIS — L0291 Cutaneous abscess, unspecified: Secondary | ICD-10-CM

## 2011-04-16 DIAGNOSIS — Z0000 Encounter for general adult medical examination without abnormal findings: Secondary | ICD-10-CM

## 2011-04-16 DIAGNOSIS — L0390 Cellulitis, unspecified: Secondary | ICD-10-CM

## 2011-04-16 MED ORDER — CEPHALEXIN 500 MG PO CAPS: 500.0000 mg | ORAL_CAPSULE | Freq: Three times a day (TID) | ORAL | Status: AC

## 2011-04-16 NOTE — Assessment & Plan Note (Signed)
No active or draining wounds but ulcerations on her lower legs are just not healing. Will tx with keflex , home compression wraps and inc lasix ot 2 tabs daily for a couple of days an then drop back down to 1.  Continue to monitor salt in your diet.

## 2011-04-16 NOTE — Progress Notes (Signed)
Subjective:    Patient ID: Jerome Hicks, male    DOB: 1951-05-15, 60 y.o.   MRN: [id number]  HPI He has really cut back on his salt intake. He noticed his leg swelling has been worse for about a weeks.  He says his vertigo has resolved.     Review of Systems  Constitutional: Negative for fever, fatigue and unexpected weight change.  Respiratory: Negative for chest tightness, shortness of breath and wheezing.   Cardiovascular: Negative for chest pain.  Gastrointestinal: Negative for abdominal distention.  Musculoskeletal: Positive for joint swelling and arthralgias. Negative for back pain.  Neurological: Negative for dizziness and light-headedness.  Hematological: Negative for adenopathy.   BP 128/72  Ht 6' 1.3" (1.862 m)  Wt 297 lb (134.718 kg)  BMI 38.86 kg/m2    Allergies  Allergen Reactions  . Penicillins     Past Medical History  Diagnosis Date  . Kidney stones   . Asbestosis     get yearly PFT's  . ED (erectile dysfunction)     has been prescribed Levitra and Viagrain in the past  . Scrotal abscess 3-03  . Leg edema     recurrent cellulitis  . Internal hemorrhoids   . Sigmoid diverticulosis     Past Surgical History  Procedure Date  . Cholecystectomy 1971  . Kidney stones 1990's    Ureteroscopic stone extraction  .  mrsa infection in left groin requiring surgical debridement 12-08  . Tonsillectomy     History   Social History  . Marital Status: Married    Spouse Name: N/A    Number of Children: N/A  . Years of Education: N/A   Occupational History  . Not on file.   Social History Main Topics  . Smoking status: Never Smoker   . Smokeless tobacco: Not on file  . Alcohol Use: No  . Drug Use: No  . Sexually Active:    Other Topics Concern  . Not on file   Social History Narrative  . No narrative on file    Family History  Problem Relation Age of Onset  . Heart attack Mother 33  . Heart attack Father 46  . Heart attack Sister 2  .  Heart attack Brother 67    Current outpatient prescriptions:amLODipine-olmesartan (AZOR) 10-40 MG per tablet, Take 1 tablet by mouth daily., Disp: 30 tablet, Rfl: 1;  doxazosin (CARDURA) 4 MG tablet, Take 1 tablet (4 mg total) by mouth daily., Disp: 30 tablet, Rfl: 11;  furosemide (LASIX) 40 MG tablet, Take 1 tablet (40 mg total) by mouth as directed. Take one a day or up to 2 x a day prn for swelling, Disp: 90 tablet, Rfl: 3 meclizine (ANTIVERT) 25 MG tablet, Take 1 tablet (25 mg total) by mouth 3 (three) times daily as needed for dizziness or nausea., Disp: 30 tablet, Rfl: 1;  potassium chloride (K-DUR) 10 MEQ tablet, Take 1 tablet (10 mEq total) by mouth daily. Take w/ fluid pill, Disp: 30 tablet, Rfl: 11;  traMADol (ULTRAM) 50 MG tablet, Take 1 tablet (50 mg total) by mouth 3 (three) times daily as needed., Disp: 90 tablet, Rfl: 1 vardenafil (LEVITRA) 10 MG tablet, Take 10 mg by mouth daily.  , Disp: , Rfl: ;  DISCONTD: carvedilol (COREG) 25 MG tablet, Take 1 tablet (25 mg total) by mouth 2 (two) times daily., Disp: 60 tablet, Rfl: 11;  cephALEXin (KEFLEX) 500 MG capsule, Take 1 capsule (500 mg total) by mouth 3 (three) times  daily., Disp: 30 capsule, Rfl: 0     Objective:   Physical Exam  Constitutional: He is oriented to person, place, and time. He appears well-developed and well-nourished.  HENT:  Head: Normocephalic and atraumatic.  Right Ear: External ear normal.  Left Ear: External ear normal.  Nose: Nose normal.  Mouth/Throat: Oropharynx is clear and moist.  Eyes: Conjunctivae and EOM are normal. Pupils are equal, round, and reactive to light.  Neck: Normal range of motion. Neck supple.  Cardiovascular: Normal rate, regular rhythm and normal heart sounds.        No carotid or abdominal bruits.   Pulmonary/Chest: Effort normal and breath sounds normal.  Abdominal: Bowel sounds are normal. He exhibits no distension and no mass. There is no tenderness. There is no rebound and no  guarding.  Musculoskeletal: Normal range of motion. He exhibits no edema.  Neurological: He is alert and oriented to person, place, and time.  Skin: Skin is warm and dry.  Psychiatric: He has a normal mood and affect. His behavior is normal. Judgment and thought content normal.          Assessment & Plan:  CPE- Exam is normal today except for obesity.  He had done great with his weight loss and dietary changes. Hold the coreg and continue regular meds. Follow up in 2 months for repeat BP check. Already had labs drawn. Repeat in CMP in 6 months. Repeat lipids in 1 year.  Reviewed lab result with him. Colonoscopy, Tdap are up to date .  Discussed shingles vaccines. He agreed to it but we forgot to admin it before he left.   Still using his tramadol on average about twice  A day.

## 2011-04-16 NOTE — Patient Instructions (Signed)
Stop the coreg (carvedilol).  Recheck blood pressure in 2 months.

## 2011-06-18 ENCOUNTER — Encounter: Payer: Self-pay | Admitting: Family Medicine

## 2011-06-18 ENCOUNTER — Ambulatory Visit (INDEPENDENT_AMBULATORY_CARE_PROVIDER_SITE_OTHER): Payer: Self-pay | Admitting: Family Medicine

## 2011-06-18 VITALS — BP 152/90 | HR 62 | Ht 75.0 in | Wt 287.0 lb

## 2011-06-18 DIAGNOSIS — Z2911 Encounter for prophylactic immunotherapy for respiratory syncytial virus (RSV): Secondary | ICD-10-CM

## 2011-06-18 DIAGNOSIS — I10 Essential (primary) hypertension: Secondary | ICD-10-CM

## 2011-06-18 MED ORDER — CARVEDILOL 25 MG PO TABS: 25.0000 mg | ORAL_TABLET | Freq: Two times a day (BID) | ORAL | Status: DC

## 2011-06-18 MED ORDER — AMLODIPINE-OLMESARTAN 10-40 MG PO TABS: 1.0000 | ORAL_TABLET | Freq: Every day | ORAL | Status: DC

## 2011-06-18 NOTE — Progress Notes (Signed)
  Subjective:    Patient ID: Jerome Hicks, male    DOB: October 21, 1951, 60 y.o.   MRN: [id number]  HPI  He is still losing weight. Too meds this AM.  Working in his yard.  He has been trying to eat more healthfully. He has decreased his Lasix to once a day and only uses extra if he feels his leg swelling is worse.   Wants to know if have samples on the azor. She CP or dizziness, or HA.  Occ SOB from his asbetosis which is usually from climbing stairs. He is compliant with his medications.  Review of Systems     Objective:   Physical Exam  Constitutional: He is oriented to person, place, and time. He appears well-developed and well-nourished.  HENT:  Head: Normocephalic and atraumatic.  Eyes: Conjunctivae are normal. Pupils are equal, round, and reactive to light.  Cardiovascular: Normal rate, regular rhythm and normal heart sounds.   Pulmonary/Chest: Effort normal and breath sounds normal.  Musculoskeletal:       1+ ankle edema bilaterally.   Neurological: He is alert and oriented to person, place, and time.  Skin: Skin is warm and dry.  Psychiatric: He has a normal mood and affect.          Assessment & Plan:

## 2011-06-18 NOTE — Patient Instructions (Signed)
Restart your coreg (carvedilol).  Follow up in 2 months.

## 2011-06-18 NOTE — Assessment & Plan Note (Signed)
I would like him to restart the carvedilol. We stopped it when his last visit his blood pressure was so well controlled and he was losing weight fairly rapidly. I think he needs to restart it at this point and we will recheck his blood pressure in 4-6 weeks. Note he was given a shingles vaccine today.

## 2011-08-10 ENCOUNTER — Encounter: Payer: Self-pay | Admitting: Family Medicine

## 2011-08-20 ENCOUNTER — Ambulatory Visit (INDEPENDENT_AMBULATORY_CARE_PROVIDER_SITE_OTHER): Payer: Self-pay | Admitting: Family Medicine

## 2011-08-20 VITALS — BP 169/85 | HR 48 | Wt 293.0 lb

## 2011-08-20 DIAGNOSIS — Z23 Encounter for immunization: Secondary | ICD-10-CM

## 2011-08-20 MED ORDER — DOXAZOSIN MESYLATE 4 MG PO TABS: 4.0000 mg | ORAL_TABLET | Freq: Every day | ORAL | Status: DC

## 2011-08-20 MED ORDER — AMLODIPINE-OLMESARTAN 10-40 MG PO TABS: 1.0000 | ORAL_TABLET | Freq: Every day | ORAL | Status: DC

## 2011-08-20 NOTE — Progress Notes (Signed)
  Subjective:    Patient ID: Jerome Hicks, male    DOB: [birth year], 60 y.o.   MRN: [id number]  Hypertension This is a new problem. The current episode started more than 1 year ago. The problem is uncontrolled. Pertinent negatives include no chest pain or shortness of breath. There are no associated agents to hypertension. Risk factors for coronary artery disease include no known risk factors. Past treatments include angiotensin blockers, calcium channel blockers, diuretics and central alpha agonists. The current treatment provides moderate improvement. There are no compliance problems.   Ran out of Azor 2 weeks ago.      Review of Systems  Respiratory: Negative for shortness of breath.   Cardiovascular: Negative for chest pain.       Objective:   Physical Exam  Constitutional: He is oriented to person, place, and time. He appears well-developed and well-nourished.  HENT:  Head: Normocephalic and atraumatic.  Cardiovascular: Normal rate, regular rhythm and normal heart sounds.   Pulmonary/Chest: Effort normal and breath sounds normal.  Musculoskeletal: He exhibits edema.       1+ on the right, trace on the left. Overall looks much better.   Neurological: He is alert and oriented to person, place, and time.  Skin: Skin is warm and dry.  Psychiatric: He has a normal mood and affect. His behavior is normal.          Assessment & Plan:  HTN - Restart Azor. Samples given. His says his home BPs have been running in the 130/60s at home.  F.U in 4 months. LE edema looks better.    Flu vac given today.

## 2011-09-21 ENCOUNTER — Other Ambulatory Visit: Payer: Self-pay | Admitting: Family Medicine

## 2011-11-15 ENCOUNTER — Encounter: Payer: Self-pay | Admitting: Family Medicine

## 2011-11-20 ENCOUNTER — Ambulatory Visit (INDEPENDENT_AMBULATORY_CARE_PROVIDER_SITE_OTHER): Payer: Self-pay | Admitting: Family Medicine

## 2011-11-20 ENCOUNTER — Encounter: Payer: Self-pay | Admitting: Family Medicine

## 2011-11-20 DIAGNOSIS — I10 Essential (primary) hypertension: Secondary | ICD-10-CM

## 2011-11-20 DIAGNOSIS — M722 Plantar fascial fibromatosis: Secondary | ICD-10-CM

## 2011-11-20 MED ORDER — AMLODIPINE-OLMESARTAN 10-40 MG PO TABS: 1.0000 | ORAL_TABLET | Freq: Every day | ORAL | Status: DC

## 2011-11-20 NOTE — Progress Notes (Signed)
  Subjective:    Patient ID: Jerome Hicks, male    DOB: [birth year], 60 y.o.   MRN: [id number]  HPI Right heel pain x6 months. Worse in the a.m. when he first stands on it. He takes tramadol occasionally for his back and says he has been using this but has not really helped. He has not tried NSAID. He has a history of chronic venous stasis. He says that his lower extremity swelling has been a little worse lately so he has had to start his Lasix again. She says the pain and tenderness is rare that he'll. He tends to get better as the day progresses and then worse again at night. No known trauma.  Hypertension-he is doing well and taking his medications. He has managed to keep most of the weight off that he has lost. No chest pain or short of breath.   Review of Systems     Objective:   Physical Exam  Constitutional: He is oriented to person, place, and time. He appears well-developed and well-nourished.  HENT:  Head: Normocephalic and atraumatic.  Cardiovascular: Normal rate, regular rhythm and normal heart sounds.   Pulmonary/Chest: Effort normal and breath sounds normal.  Musculoskeletal: He exhibits edema.       2 + pittig ankel edema bilat.  Right ankel with NROM and strength 5/5. Tender over the base of teh calcaneus. No pain into the arch. No pain with dorsiflexion or plantar flexion. No erythema or bruising.   Neurological: He is alert and oriented to person, place, and time.  Skin: Skin is warm and dry.  Psychiatric: He has a normal mood and affect. His behavior is normal.          Assessment & Plan:  Plantar fasciitis versus heel spur. I gave him a handout on some gentle stretches to do. I also recommend an over-the-counter NSAID. He says he has ibuprofen at home. Encouraged him to take this with food and water and stop immediately if any GI upset. It is not a significant improvement in  4 weeks then please to call the office and we can refer him to podiatry for possible  injections and splinting. I did not perform an x-ray today.   Hypertension-well controlled today. Keep up the good work. Continue Lasix as needed for some extremity swelling. Continue to make sure he is eating a low salt diet. Hx of chronic venous stasis before starting amlodipine.

## 2011-12-20 ENCOUNTER — Other Ambulatory Visit: Payer: Self-pay | Admitting: Family Medicine

## 2012-02-26 ENCOUNTER — Other Ambulatory Visit: Payer: Self-pay | Admitting: Family Medicine

## 2012-04-16 ENCOUNTER — Other Ambulatory Visit: Payer: Self-pay | Admitting: Family Medicine

## 2012-04-16 NOTE — Telephone Encounter (Signed)
OK to refill. Needs to f/u for BP soon.

## 2012-04-16 NOTE — Telephone Encounter (Signed)
Please advise if we can refill 

## 2012-06-18 ENCOUNTER — Encounter: Payer: Self-pay | Admitting: Family Medicine

## 2012-06-18 LAB — PULMONARY FUNCTION TEST

## 2012-06-19 ENCOUNTER — Other Ambulatory Visit: Payer: Self-pay | Admitting: Family Medicine

## 2012-06-19 ENCOUNTER — Ambulatory Visit (INDEPENDENT_AMBULATORY_CARE_PROVIDER_SITE_OTHER): Payer: Self-pay | Admitting: Family Medicine

## 2012-06-19 ENCOUNTER — Encounter: Payer: Self-pay | Admitting: Family Medicine

## 2012-06-19 VITALS — BP 123/77 | HR 78 | Ht 75.0 in | Wt 297.0 lb

## 2012-06-19 DIAGNOSIS — Z6836 Body mass index (BMI) 36.0-36.9, adult: Secondary | ICD-10-CM

## 2012-06-19 DIAGNOSIS — Z0000 Encounter for general adult medical examination without abnormal findings: Secondary | ICD-10-CM

## 2012-06-19 DIAGNOSIS — I4891 Unspecified atrial fibrillation: Secondary | ICD-10-CM

## 2012-06-19 DIAGNOSIS — R0789 Other chest pain: Secondary | ICD-10-CM

## 2012-06-19 MED ORDER — WARFARIN SODIUM 5 MG PO TABS: 5.0000 mg | ORAL_TABLET | Freq: Every day | ORAL | Status: DC

## 2012-06-19 NOTE — Progress Notes (Signed)
Subjective:    Patient ID: Jerome Hicks, male    DOB: [birth year], 61 y.o.   MRN: [id number]  HPI Here for CPE. Today.  Has had ankle swelling recently.   Ulcer on the side of the tongue x 3-4 months.  Doesn't see sa dentist.  Got his yearly CT of chest and PFTs yesterday.  He has a history of pulmonary asbestosis.  Chest Pain - Says has noticed some SOB with walking on and off for about 6 months. Fredna Dow notices after about 10-20 feet. Occ will experience chest tightness in the center of his chest when this happens. No radiation of pain into the neck or to the arms. No numbness or tingling. If he stops and rest says he feels better in 5-10 min.   No diaphoresis.  Says occ feels dizzy when this happens.   He is not very active but does still met his own grass. He has no prior history of heart disease but does have a history of insulin resistance high blood pressure and obesity. He does take ASA 81mg  daily.     Review of Systems Comprehensive ROS is neg.    BP 123/77  Pulse 78  Ht 6\' 3"  (1.905 m)  Wt 297 lb (134.718 kg)  BMI 37.12 kg/m2  SpO2 94%    Allergies  Allergen Reactions  . Penicillins     Past Medical History  Diagnosis Date  . Kidney stones   . Asbestosis     get yearly PFT's  . ED (erectile dysfunction)     has been prescribed Levitra and Viagrain in the past  . Scrotal abscess 3-03  . Leg edema     recurrent cellulitis  . Internal hemorrhoids   . Sigmoid diverticulosis     Past Surgical History  Procedure Date  . Cholecystectomy 1971  . Kidney stones 1990's    Ureteroscopic stone extraction  .  mrsa infection in left groin requiring surgical debridement 12-08  . Tonsillectomy     History   Social History  . Marital Status: Widowed    Spouse Name: N/A    Number of Children: 2  . Years of Education: N/A   Occupational History  . On disability     Social History Main Topics  . Smoking status: Never Smoker   . Smokeless tobacco: Not on file  .  Alcohol Use: No  . Drug Use: No  . Sexually Active:    Other Topics Concern  . Not on file   Social History Narrative   Staying active    Family History  Problem Relation Age of Onset  . Heart attack Mother 20  . Heart attack Father 44  . Heart attack Sister 84  . Heart attack Brother 43  . Multiple sclerosis Daughter     Outpatient Encounter Prescriptions as of 06/19/2012  Medication Sig Dispense Refill  . amLODipine-olmesartan (AZOR) 10-40 MG per tablet Take 1 tablet by mouth daily.  30 tablet  6  . aspirin 81 MG tablet Take 81 mg by mouth daily.        Marland Kitchen doxazosin (CARDURA) 4 MG tablet Take 1 tablet (4 mg total) by mouth daily.  90 tablet  2  . furosemide (LASIX) 40 MG tablet Take 80 mg by mouth daily.      . meclizine (ANTIVERT) 25 MG tablet TAKE ONE TABLET BY MOUTH THREE TIMES DAILY AS NEEDED FOR DIZZINESS OR NAUSEA AS DIRECTED  30 tablet  0  .  potassium chloride (K-DUR) 10 MEQ tablet Take 1 tablet (10 mEq total) by mouth daily. Take w/ fluid pill  30 tablet  11  . traMADol (ULTRAM) 50 MG tablet TAKE ONE TABLET BY MOUTH THREE TIMES DAILY AS NEEDED  90 tablet  0  . vardenafil (LEVITRA) 10 MG tablet Take 10 mg by mouth daily.        Marland Kitchen DISCONTD: AZOR 10-40 MG per tablet TAKE 1 TABLET BY MOUTH EVERY DAY  90 tablet  0  . DISCONTD: furosemide (LASIX) 40 MG tablet Take 1 tablet (40 mg total) by mouth as directed. Take one a day or up to 2 x a day prn for swelling  90 tablet  3  . carvedilol (COREG) 25 MG tablet Take 1 tablet (25 mg total) by mouth 2 (two) times daily.  60 tablet  2          Objective:   Physical Exam  Constitutional: He is oriented to person, place, and time. He appears well-developed and well-nourished.  HENT:  Head: Normocephalic and atraumatic.  Right Ear: External ear normal.  Left Ear: External ear normal.  Nose: Nose normal.  Mouth/Throat: Oropharynx is clear and moist.  Eyes: Conjunctivae and EOM are normal. Pupils are equal, round, and reactive to  light.  Neck: Normal range of motion. Neck supple. No thyromegaly present.  Cardiovascular: Normal rate, regular rhythm, normal heart sounds and intact distal pulses.        No caortid or abodminal bruits.   Pulmonary/Chest: Effort normal and breath sounds normal.  Abdominal: Soft. Bowel sounds are normal. He exhibits no distension and no mass. There is no tenderness. There is no rebound and no guarding.  Musculoskeletal: Normal range of motion. He exhibits edema.       2+ ankle edema bilaterally.   Lymphadenopathy:    He has no cervical adenopathy.  Neurological: He is alert and oriented to person, place, and time. He has normal reflexes.  Skin: Skin is warm and dry.  Psychiatric: He has a normal mood and affect. His behavior is normal. Judgment and thought content normal.          Assessment & Plan:  CPE-  Start a regular exercise program and make sure you are eating a healthy diet Try to eat 4 servings of dairy a day  Your vaccines are up to date.  Due for escreening labs  Hypertension-well-controlled. Continue current regimen. Samples of Azor given.    Chest pain-could be coronary artery disease with his risk factors of high blood pressure, obesity impaired fasting glucose. We'll check complete metabolic panel, thyroid, CBC to rule out anemia. I also check cardiac enzymes. He's not having any active discomfort or pain or shortness of breath today. No history of heart disease. EKG today shows rate of 71 bpm, Normal axila.  Q wave in lead 3. A-fib.  Will refer to cards for further work up and evaluation.    Venous stasis-he has not been taking his Lasix daily I encouraged him to do so to help maintain low fluid levels around his ankles  Pulmonary asbestosis-he just saw the pulmonologist and had his yearly workup. I will keep an eye out for these results.  Paroxysmal Atrial fibrillation - Discussed dx and risks. It sounds like he may have had sxs for 6 months.  Since he is  symptomatic will start coumadin 5mg  and refer to cardiology. F/U in one week for coumadin check.

## 2012-06-19 NOTE — Patient Instructions (Addendum)
Start a regular exercise program and make sure you are eating a healthy diet Try to eat 4 servings of dairy a day  Your vaccines are up to date.  Start coumadin (blood thinner) once a day in the evening. Come in for coumadin clinic in one week.  We will call you with your lab results. If you don't here from [place] in about a week then please give [place] a call at [phone].

## 2012-06-20 ENCOUNTER — Encounter: Payer: Self-pay | Admitting: *Deleted

## 2012-06-20 LAB — CBC WITH DIFFERENTIAL/PLATELET
Eosinophils Absolute: 0.1 10*3/uL (ref 0.0–0.7)
Eosinophils Relative: 2 % (ref 0–5)
HCT: 41.4 % (ref 39.0–52.0)
Lymphocytes Relative: 29 % (ref 12–46)
Lymphs Abs: 2 10*3/uL (ref 0.7–4.0)
MCH: 30.2 pg (ref 26.0–34.0)
MCV: 90 fL (ref 78.0–100.0)
Monocytes Absolute: 0.6 10*3/uL (ref 0.1–1.0)
Monocytes Relative: 8 % (ref 3–12)
Platelets: 218 10*3/uL (ref 150–400)
RBC: 4.6 MIL/uL (ref 4.22–5.81)

## 2012-06-20 LAB — COMPLETE METABOLIC PANEL WITH GFR
BUN: 16 mg/dL (ref 6–23)
CO2: 26 mEq/L (ref 19–32)
Creat: 1.03 mg/dL (ref 0.50–1.35)
GFR, Est African American: >89 mL/min
GFR, Est Non African American: 77 mL/min
Glucose, Bld: 119 mg/dL — ABNORMAL HIGH (ref 70–99)
Total Bilirubin: 3.5 mg/dL — ABNORMAL HIGH (ref 0.3–1.2)

## 2012-06-20 LAB — TSH: TSH: 3.05 u[IU]/mL (ref 0.350–4.500)

## 2012-06-20 LAB — LIPID PANEL
Cholesterol: 116 mg/dL (ref 0–200)
HDL: 39 mg/dL — ABNORMAL LOW (ref >39)
Triglycerides: 61 mg/dL (ref <150)

## 2012-06-20 LAB — CK TOTAL AND CKMB (NOT AT ARMC)
CK, MB: 3.6 ng/mL (ref 0.3–4.0)
Relative Index: 1.7 (ref 0.0–2.5)

## 2012-06-24 ENCOUNTER — Encounter: Payer: Self-pay | Admitting: *Deleted

## 2012-06-25 ENCOUNTER — Encounter: Payer: Self-pay | Admitting: Cardiology

## 2012-06-25 ENCOUNTER — Ambulatory Visit (INDEPENDENT_AMBULATORY_CARE_PROVIDER_SITE_OTHER): Payer: Self-pay | Admitting: Cardiology

## 2012-06-25 VITALS — BP 138/83 | HR 84 | Wt 292.0 lb

## 2012-06-25 DIAGNOSIS — I10 Essential (primary) hypertension: Secondary | ICD-10-CM

## 2012-06-25 DIAGNOSIS — R0609 Other forms of dyspnea: Secondary | ICD-10-CM

## 2012-06-25 DIAGNOSIS — I4891 Unspecified atrial fibrillation: Secondary | ICD-10-CM | POA: Insufficient documentation

## 2012-06-25 DIAGNOSIS — R0600 Dyspnea, unspecified: Secondary | ICD-10-CM

## 2012-06-25 NOTE — Patient Instructions (Addendum)
Your physician recommends that you schedule a follow-up appointment in: 4 WEEKS WITH DR Jens Som IN [place]  STOP ASPIRIN

## 2012-06-25 NOTE — Assessment & Plan Note (Signed)
Blood pressure controlled. Continue present medications. 

## 2012-06-25 NOTE — Progress Notes (Signed)
HPI: 61 year old male with no prior cardiac history for evaluation of atrial fibrillation and dyspnea. The patient was recently seen for a routine examination and his electrocardiogram showed atrial fibrillation. Renal function, hemoglobin and TSH normal. Glucose mildly elevated. Patient does have dyspnea on exertion but there is no orthopnea or PND. He denies chest pain, palpitations or syncope. He has chronic pedal edema. He is unaware of his atrial fibrillation.  Current Outpatient Prescriptions  Medication Sig Dispense Refill  . amLODipine-olmesartan (AZOR) 10-40 MG per tablet Take 1 tablet by mouth daily.  30 tablet  6  . carvedilol (COREG) 25 MG tablet Take 1 tablet (25 mg total) by mouth 2 (two) times daily.  60 tablet  2  . doxazosin (CARDURA) 4 MG tablet Take 1 tablet (4 mg total) by mouth daily.  90 tablet  2  . furosemide (LASIX) 40 MG tablet Take 80 mg by mouth as needed.       . meclizine (ANTIVERT) 25 MG tablet TAKE ONE TABLET BY MOUTH THREE TIMES DAILY AS NEEDED FOR DIZZINESS OR NAUSEA AS DIRECTED  30 tablet  0  . potassium chloride (K-DUR) 10 MEQ tablet Take 10 mEq by mouth as needed. Take w/ fluid pill      . traMADol (ULTRAM) 50 MG tablet       . vardenafil (LEVITRA) 10 MG tablet Take 10 mg by mouth as needed.       . warfarin (COUMADIN) 5 MG tablet TAKE 1 TABLET BY MOUTH DAILY  90 tablet  0  . DISCONTD: potassium chloride (K-DUR) 10 MEQ tablet Take 1 tablet (10 mEq total) by mouth daily. Take w/ fluid pill  30 tablet  11    Allergies  Allergen Reactions  . Penicillins     Past Medical History  Diagnosis Date  . Kidney stones   . Asbestosis     get yearly PFT's  . ED (erectile dysfunction)     has been prescribed Levitra and Viagrain in the past  . Scrotal abscess 3-03  . Leg edema     recurrent cellulitis  . Internal hemorrhoids   . Sigmoid diverticulosis   . Atrial fibrillation   . Hypertension     Past Surgical History  Procedure Date  . Cholecystectomy  1971  . Kidney stones 1990's    Ureteroscopic stone extraction  .  mrsa infection in left groin requiring surgical debridement 12-08  . Tonsillectomy     History   Social History  . Marital Status: Widowed    Spouse Name: N/A    Number of Children: 2  . Years of Education: N/A   Occupational History  . On disability     Social History Main Topics  . Smoking status: Never Smoker   . Smokeless tobacco: Not on file  . Alcohol Use: No  . Drug Use: No  . Sexually Active:    Other Topics Concern  . Not on file   Social History Narrative   Staying active    Family History  Problem Relation Age of Onset  . Heart attack Mother 28  . Heart attack Father 46  . Heart attack Sister 9  . Heart attack Brother 43  . Multiple sclerosis Daughter     ROS: no fevers or chills, productive cough, hemoptysis, dysphasia, odynophagia, melena, hematochezia, dysuria, hematuria, rash, seizure activity, orthopnea, PND, claudication. Remaining systems are negative.  Physical Exam:   Blood pressure 138/83, pulse 84, weight 132.45 kg (292 lb).  General:  Well developed/well nourished in NAD Skin warm/dry Patient not depressed No peripheral clubbing Back-normal HEENT-normal/normal eyelids Neck supple/normal carotid upstroke bilaterally; no bruits; no JVD; no thyromegaly chest - CTA/ normal expansion CV - irregular/normal S1 and S2; no murmurs, rubs or gallops;  PMI nondisplaced Abdomen -NT/ND, no HSM, no mass, + bowel sounds, no bruit 2+ femoral pulses, no bruits Ext-2+ edema, varicosities noted that no chords, 2+ DP, chronic skin changes Neuro-grossly nonfocal  ECG 06/19/2012-atrial fibrillation at a rate of 71, nonspecific ST changes. Atrial fibrillation at a rate of 80. Nonspecific ST changes.

## 2012-06-25 NOTE — Assessment & Plan Note (Signed)
The patient does have dyspnea on exertion and multiple risk factors including strong family history of coronary disease. I have recommended a Lexiscan Myoview. He will defer this until he knows about his insurance. He will contact us when he is agreeable.

## 2012-06-25 NOTE — Assessment & Plan Note (Signed)
Patient has atrial fibrillation of unknown duration. He is relatively asymptomatic although certainly his rhythm disturbance could be contributing to dyspnea on exertion. However this does sound chronic. Plan to continue carvedilol for rate control. Note recent TSH normal. He has embolic risk factors of hypertension. His glucose is also mildly elevated on recent laboratories and he may be diabetic. I will discontinue aspirin and he will continue on Coumadin with goal INR 2-3. This is being followed by primary care. The patient does not have insurance at this point and cannot afford one of the newer anticoagulants. He needs an echocardiogram to evaluate LV function. He apparently has applied for Medicare and we'll find out on July 29 whether he has been successful. We will plan to proceed with the echocardiogram once we are clear that he has insurance as he states he cannot afford testing otherwise. I am not convinced we necessarily need to try to restore sinus rhythm as it is not clear to me that he is symptomatic and the duration of his atrial fibrillation is unknown. Rate control and anticoagulation may be best option.

## 2012-06-26 ENCOUNTER — Telehealth: Payer: Self-pay | Admitting: *Deleted

## 2012-06-26 ENCOUNTER — Ambulatory Visit (INDEPENDENT_AMBULATORY_CARE_PROVIDER_SITE_OTHER): Payer: Self-pay | Admitting: Family Medicine

## 2012-06-26 VITALS — BP 131/84 | HR 84

## 2012-06-26 DIAGNOSIS — E8881 Metabolic syndrome: Secondary | ICD-10-CM

## 2012-06-26 DIAGNOSIS — I4891 Unspecified atrial fibrillation: Secondary | ICD-10-CM

## 2012-06-26 NOTE — Telephone Encounter (Signed)
Pt seen by dr Jens Som in [place] on 06-25-12. He currently is waiting to get approval for insurance, he should find out next week. He is to call and let me know if he gets insurance. If he gets insurance coverage dr Jens Som wants the pt to have an echo for atrial fib and a lexiscan myoview for SOB. If he does not get insurance will discuss with dr Jens Som. Will wait to order and schedule after hearing from the pt.

## 2012-06-26 NOTE — Progress Notes (Signed)
  Subjective:    Patient ID: Jerome Hicks, male    DOB: 11-17-1951, 61 y.o.   MRN: [id number] INR and A1C check HPI    Review of Systems     Objective:   Physical Exam        Assessment & Plan:  Glucose was elevated on recent labs and he was fasting. Thus we did do an A1c today to make sure he is not diabetic. A1c which is 4.9 which is well within the normal range.

## 2012-07-03 ENCOUNTER — Ambulatory Visit (INDEPENDENT_AMBULATORY_CARE_PROVIDER_SITE_OTHER): Payer: Self-pay | Admitting: Family Medicine

## 2012-07-03 VITALS — BP 148/85 | HR 83

## 2012-07-03 DIAGNOSIS — I4891 Unspecified atrial fibrillation: Secondary | ICD-10-CM

## 2012-07-03 LAB — POCT INR: INR: 3.7

## 2012-07-03 NOTE — Progress Notes (Signed)
  Subjective:    Patient ID: Jerome Hicks, male    DOB: Apr 29, 1951, 61 y.o.   MRN: [id number]  HPI Pt denies any excessive bruising or bleeding. 5 mins spent with pt.    Review of Systems     Objective:   Physical Exam        Assessment & Plan:

## 2012-07-10 ENCOUNTER — Ambulatory Visit (INDEPENDENT_AMBULATORY_CARE_PROVIDER_SITE_OTHER): Payer: Self-pay | Admitting: Family Medicine

## 2012-07-10 VITALS — BP 131/81 | HR 91

## 2012-07-10 DIAGNOSIS — I4891 Unspecified atrial fibrillation: Secondary | ICD-10-CM

## 2012-07-10 LAB — POCT INR: INR: 3.1

## 2012-07-10 NOTE — Progress Notes (Signed)
  Subjective:    Patient ID: Jerome Hicks, male    DOB: 09/13/1951, 61 y.o.   MRN: [id number] Pt denies any excessive bruising or bleeding. 5 mins spent with pt.  HPI    Review of Systems     Objective:   Physical Exam        Assessment & Plan:   

## 2012-07-17 ENCOUNTER — Ambulatory Visit (INDEPENDENT_AMBULATORY_CARE_PROVIDER_SITE_OTHER): Payer: Self-pay | Admitting: Family Medicine

## 2012-07-17 VITALS — BP 139/86 | HR 76

## 2012-07-17 DIAGNOSIS — I4891 Unspecified atrial fibrillation: Secondary | ICD-10-CM

## 2012-07-17 LAB — POCT INR: INR: 2.1

## 2012-07-17 NOTE — Progress Notes (Signed)
Pt informed

## 2012-07-17 NOTE — Progress Notes (Signed)
  Subjective:    Patient ID: Jerome Hicks, male    DOB: 08/07/1951, 61 y.o.   MRN: [id number] Pt denies any excessive bruising or bleeding. 5 mins spent with pt.  HPI    Review of Systems     Objective:   Physical Exam        Assessment & Plan:   

## 2012-07-23 ENCOUNTER — Encounter: Payer: Self-pay | Admitting: Cardiology

## 2012-07-23 NOTE — Progress Notes (Signed)
   HPI: 61 year old male for fu of atrial fibrillation and dyspnea. The patient was previously seen for a routine examination and his electrocardiogram showed atrial fibrillation. Renal function, hemoglobin and TSH normal. Glucose mildly elevated. When I saw him in July of 2013 we recommended a nuclear study and echocardiogram but the patient declined because he could not afford the studies and had no insurance. He stated he would proceed when able. Since we last saw him,   Current Outpatient Prescriptions  Medication Sig Dispense Refill  . amLODipine-olmesartan (AZOR) 10-40 MG per tablet Take 1 tablet by mouth daily.  30 tablet  6  . carvedilol (COREG) 25 MG tablet Take 1 tablet (25 mg total) by mouth 2 (two) times daily.  60 tablet  2  . doxazosin (CARDURA) 4 MG tablet Take 1 tablet (4 mg total) by mouth daily.  90 tablet  2  . furosemide (LASIX) 40 MG tablet Take 80 mg by mouth as needed.       . meclizine (ANTIVERT) 25 MG tablet TAKE ONE TABLET BY MOUTH THREE TIMES DAILY AS NEEDED FOR DIZZINESS OR NAUSEA AS DIRECTED  30 tablet  0  . potassium chloride (K-DUR) 10 MEQ tablet Take 10 mEq by mouth as needed. Take w/ fluid pill      . traMADol (ULTRAM) 50 MG tablet       . vardenafil (LEVITRA) 10 MG tablet Take 10 mg by mouth as needed.       . warfarin (COUMADIN) 5 MG tablet TAKE 1 TABLET BY MOUTH DAILY  90 tablet  0     Past Medical History  Diagnosis Date  . Kidney stones   . Asbestosis     get yearly PFT's  . ED (erectile dysfunction)     has been prescribed Levitra and Viagrain in the past  . Scrotal abscess 3-03  . Leg edema     recurrent cellulitis  . Internal hemorrhoids   . Sigmoid diverticulosis   . Atrial fibrillation   . Hypertension     Past Surgical History  Procedure Date  . Cholecystectomy 1971  . Kidney stones 1990's    Ureteroscopic stone extraction  .  mrsa infection in left groin requiring surgical debridement 12-08  . Tonsillectomy     History   Social  History  . Marital Status: Widowed    Spouse Name: N/A    Number of Children: 2  . Years of Education: N/A   Occupational History  . On disability     Social History Main Topics  . Smoking status: Never Smoker   . Smokeless tobacco: Not on file  . Alcohol Use: No  . Drug Use: No  . Sexually Active:    Other Topics Concern  . Not on file   Social History Narrative   Staying active    ROS: no fevers or chills, productive cough, hemoptysis, dysphasia, odynophagia, melena, hematochezia, dysuria, hematuria, rash, seizure activity, orthopnea, PND, pedal edema, claudication. Remaining systems are negative.  Physical Exam: Well-developed well-nourished in no acute distress.  Skin is warm and dry.  HEENT is normal.  Neck is supple. No thyromegaly.  Chest is clear to auscultation with normal expansion.  Cardiovascular exam is regular rate and rhythm.  Abdominal exam nontender or distended. No masses palpated. Extremities show no edema. neuro grossly intact  ECG     This encounter was created in error - please disregard.

## 2012-07-25 NOTE — Telephone Encounter (Signed)
Spoke with pt, he has not gotten insurance yet. He will call to schedule testing once insurance issues get settled

## 2012-07-25 NOTE — Telephone Encounter (Signed)
Patient returning nurse call, he can be reached at hm# °

## 2012-07-25 NOTE — Telephone Encounter (Signed)
Left message for pt to call.

## 2012-07-31 ENCOUNTER — Ambulatory Visit (INDEPENDENT_AMBULATORY_CARE_PROVIDER_SITE_OTHER): Payer: Self-pay | Admitting: Family Medicine

## 2012-07-31 VITALS — BP 134/85 | HR 85

## 2012-07-31 DIAGNOSIS — I4891 Unspecified atrial fibrillation: Secondary | ICD-10-CM

## 2012-07-31 NOTE — Patient Instructions (Signed)
Warfarin, Questions and Answers Warfarin (Coumadin) is a prescription medication that delays normal blood clotting. This is called an "anticoagulant" or "blood thinner." These medications do not actually cause the blood to become less thick, only less likely to clot. Normally, when body tissues are cut or damaged, the blood clots in order to prevent blood loss. Some clots form when they are not supposed to and may travel through the bloodstream and become lodged in smaller blood vessels. Blockage of blood flow can cause serious problems including a stroke (when a blood vessel leading to a portion of the brain is blocked) or a heart attack (when a blood vessel leading to the heart is blocked). WHO SHOULD USE WARFARIN?  Warfarin is prescribed for individuals who have a risk for developing harmful blood clots. People at risk include individuals with a mechanical heart valve, individuals with the irregular heart rhythm called atrial fibrillation, and individuals with certain clotting disorders.   Warfarin is also used in individuals who have a history of developing harmful clots, including individuals who have had a stroke, heart attack, a clot which has traveled to the lung (pulmonary embolism), or a blood clot in the leg (deep venous thrombosis or DVT).   Warfarin may be used to prevent the growth of an existing clot, such as a DVT.  HOW IS THE EFFECT OF WARFARIN MONITORED? The goal of warfarin therapy is to lessen the clotting tendency of blood, but not to prevent clotting completely. Therefore, the anticoagulation effect of warfarin must be carefully watched using laboratory blood tests.  The test most commonly used to measure the effects of warfarin is the prothrombin time ("protime", or PT).   The PT is also used to compute a value known as the International Normalized Ratio (INR).   The longer it takes the blood to clot, the higher the PT and INR. Your caregiver will tell you what your "target"  INR range is. In most cases, the target range will be 2 to 3, although other ranges may be chosen. If, at any time, your INR is below the target range, there is a risk of clotting. If, on the other hand, your INR is above the target range, there is an increased risk of bleeding.  When warfarin is first prescribed, a higher "loading" dose may be given so that an effective blood level of the medication is reached quickly. The loading dose is then adjusted downward until a maintenance dose (a dose which is enough to keep the INR where it should be) is reached. Once you are on a stable maintenance dose, the PT and INR are watched less often, usually once every 2 to 4 weeks. The warfarin dose may be changed at times in response to a changing INR or to medical changes that call for an increase or decrease in warfarin therapy. It is important to keep all laboratory and caregiver follow-up appointments! Not keeping appointments could result in a chronic or permanent injury, pain, or disability. WHAT ARE THE SIDE EFFECTS OF WARFARIN?  Excessive blood thinning can cause bleeding (hemorrhage) from any part of the body. Individuals on warfarin should report any falls or accidents, or any symptoms of bleeding or unusual bruising. Signs of unusual bleeding may include bleeding from the gums, blood in the urine, bloody or dark stool, a nosebleed, coughing up blood, or vomiting blood. Because the risk of bleeding increases as the INR rises above the desired limit, the INR is closely watched during warfarin therapy. Adjustments are  made as needed to keep the INR at an acceptable level (within the target range).   Other body systems can be affected by warfarin. In addition to any signs of bleeding, patients should report skin rash or irritation, unusual fever, continual nausea or stomach upset, severe pain in the joints or back, swelling or pain at an injection site, or symptoms of a stroke.  IS WARFARIN SAFE TO USE DURING  PREGNANCY?  Warfarin is generally not advised during pregnancy, at least during the first trimester, due to an increased risk of birth defects. A woman who becomes pregnant or plans to become pregnant while on warfarin therapy should notify the caregiver immediately.   Although warfarin does not pass into breast milk, a woman who wishes to breastfeed while taking warfarin should also consult with her caregiver.  ARE THERE SPECIAL PRECAUTIONS TO FOLLOW WHILE TAKING WARFARIN? Follow the dosage schedule carefully. Warfarin should be taken exactly as directed. A missed or extra dose requires a call to the prescribing caregiver for advice. Do not change the dose of warfarin on your own to make up for missed or extra doses. It is very important to take warfarin as directed since bleeding or blood clots could result in chronic or permanent injury, pain, or disability. Warfarin tablets come in different strengths. Each tablet strength is usually a different color, with the amount of warfarin (in milligrams) clearly printed on the tablet. You should immediately report to the pharmacist or caregiver any prescription which is different than the previous one, to make sure that you are taking the correct dose. Avoid situations that cause bleeding. You may have a tendency to bleed more easily than usual while taking warfarin. Some simple changes which can limit bleeding are:  Use a softer toothbrush.   Floss with waxed floss rather than unwaxed floss.   Shave with an electric razor rather than a blade.   Limit use of sharp objects.   Avoid potentially harmful activities (such as contact sports).  Medical conditions. Medical conditions which increase your clotting time include:  Diarrhea.   Worsening of heart failure.   Fever.   Worsening of liver function.  Alcohol use. Chronic use of alcohol affects the body's ability to handle warfarin. You should avoid alcohol or consume it only in very limited  quantities. General alcohol intake guidelines are 1 drink for women and 2 drinks for men per day. (1 drink = 5 ounces of wine, 12 ounces of beer or 1 ounces of liquor). Other medications. A number of drugs can interact with warfarin. Some of the most common over-the-counter pain relievers (acetaminophen, aspirin, and nonsteroidal anti-inflammatory medications) make the anticoagulant effects of warfarin stronger. If these medicines are taken, your caregiver may need to adjust the dose of warfarin. Talk to your caregiver if you have problems or questions. Dietary considerations. Some foods and supplements may interfere with warfarin's effectiveness. Examples appear below. Once you are stabilized on a warfarin regimen, no major dietary changes should be made without consulting your caregiver. Vitamin K. Foods high in vitamin K can cause warfarin to be less effective. You should avoid eating very large amounts of foods known to be high in vitamin K. The serving size for foods high in vitamin K are  cup cooked and 1 cup raw, unless otherwise noted, including:  Beef liver (3.5 ounces).   Garbanzo beans.   Kale.   Spinach.   Nettle greens.   Swiss chard.   Pork liver (3.5 ounces).  Broccoli.   Cabbage.   Watercress.   Soybeans.   Endive.   Green tea (made with  ounce dried tea).   Brussels sprouts.   Cauliflower.   Parsley (1 Tablespoon).   Collard greens.   Seaweed (limit 2 sheets).   Turnip greens.   Soybean oil.   Green peas.  Eating large amounts of these foods may seriously reduce how well a dose of warfarin works. It is not necessary to avoid these foods, but do not change the amount of vitamin K-containing foods you currently eat. Changing to a diet low in foods containing vitamin K may lead to an excessive warfarin effect. Do not drink herbal teas containing coumarin while taking this medication. IS IT SAFE TO USE SUPPLEMENTS WHILE TAKING WARFARIN?  Vitamin E.  Vitamin E may increase the anticoagulant effects of warfarin. You should consult your caregiver before adding or changing a dose of vitamin E or any other vitamin.   Check other supplements such as multivitamins and calcium supplements. These may contain vitamin K.  FOR MORE INFORMATION Your caregiver is the best resource for important information related to your particular case. Because every person is different, it is important that your situation is looked at by someone who knows you as a whole person and also knows the specifics of your condition. Document Released: 11/19/2005 Document Revised: 11/08/2011 Document Reviewed: 02/11/2007 [facility] Patient Information 2012 [place], [place].

## 2012-07-31 NOTE — Progress Notes (Signed)
  Subjective:    Patient ID: Jerome Hicks, male    DOB: 1951-04-11, 61 y.o.   MRN: [id number] Pt denies any excessive bruising or bleeding. 5 mins spent with pt.  HPI    Review of Systems     Objective:   Physical Exam        Assessment & Plan:

## 2012-08-07 ENCOUNTER — Ambulatory Visit (INDEPENDENT_AMBULATORY_CARE_PROVIDER_SITE_OTHER): Payer: Self-pay | Admitting: Family Medicine

## 2012-08-07 VITALS — BP 156/95 | HR 75

## 2012-08-07 DIAGNOSIS — I4891 Unspecified atrial fibrillation: Secondary | ICD-10-CM

## 2012-08-07 LAB — POCT INR: INR: 2.1

## 2012-08-07 NOTE — Progress Notes (Signed)
Pt informed and appt scheduled.

## 2012-08-07 NOTE — Progress Notes (Signed)
  Subjective:    Patient ID: Jerome Hicks, male    DOB: 12/24/1950, 61 y.o.   MRN: [id number] Pt denies any excessive bruising or bleeding. 5 mins spent with pt.  HPI    Review of Systems     Objective:   Physical Exam        Assessment & Plan:   

## 2012-08-21 ENCOUNTER — Ambulatory Visit (INDEPENDENT_AMBULATORY_CARE_PROVIDER_SITE_OTHER): Payer: Self-pay | Admitting: Family Medicine

## 2012-08-21 DIAGNOSIS — Z298 Encounter for other specified prophylactic measures: Secondary | ICD-10-CM

## 2012-08-21 DIAGNOSIS — Z2989 Encounter for other specified prophylactic measures: Secondary | ICD-10-CM

## 2012-08-21 DIAGNOSIS — I4891 Unspecified atrial fibrillation: Secondary | ICD-10-CM

## 2012-08-21 DIAGNOSIS — Z23 Encounter for immunization: Secondary | ICD-10-CM

## 2012-08-21 LAB — POCT INR: INR: 2.4

## 2012-08-21 NOTE — Progress Notes (Signed)
  Subjective:    Patient ID: Jerome Hicks, male    DOB: [birth year], 61 y.o.   MRN: [id number]  HPI   Pt denies any excessive bruising or bleeding. 5 mins spent with pt.Pt also here for flu vaccine. Review of Systems     Objective:   Physical Exam        Assessment & Plan:

## 2012-08-21 NOTE — Progress Notes (Signed)
Pt informed

## 2012-08-22 ENCOUNTER — Encounter: Payer: Self-pay | Admitting: Family Medicine

## 2012-09-04 ENCOUNTER — Ambulatory Visit: Payer: Self-pay | Admitting: Family Medicine

## 2012-09-04 ENCOUNTER — Encounter: Payer: Self-pay | Admitting: Family Medicine

## 2012-09-04 ENCOUNTER — Ambulatory Visit (INDEPENDENT_AMBULATORY_CARE_PROVIDER_SITE_OTHER): Payer: Self-pay | Admitting: Family Medicine

## 2012-09-04 VITALS — BP 130/75 | HR 86 | Ht 75.0 in | Wt 298.0 lb

## 2012-09-04 DIAGNOSIS — I4891 Unspecified atrial fibrillation: Secondary | ICD-10-CM

## 2012-09-04 DIAGNOSIS — L0390 Cellulitis, unspecified: Secondary | ICD-10-CM

## 2012-09-04 DIAGNOSIS — I10 Essential (primary) hypertension: Secondary | ICD-10-CM

## 2012-09-04 DIAGNOSIS — R209 Unspecified disturbances of skin sensation: Secondary | ICD-10-CM

## 2012-09-04 DIAGNOSIS — L0291 Cutaneous abscess, unspecified: Secondary | ICD-10-CM

## 2012-09-04 DIAGNOSIS — R200 Anesthesia of skin: Secondary | ICD-10-CM

## 2012-09-04 DIAGNOSIS — E8881 Metabolic syndrome: Secondary | ICD-10-CM

## 2012-09-04 LAB — POCT GLYCOSYLATED HEMOGLOBIN (HGB A1C): Hemoglobin A1C: 4.8

## 2012-09-04 MED ORDER — SULFAMETHOXAZOLE-TMP DS 800-160 MG PO TABS: 1.0000 | ORAL_TABLET | Freq: Two times a day (BID) | ORAL | Status: AC

## 2012-09-04 NOTE — Progress Notes (Signed)
  Subjective:    Patient ID: Jerome Hicks, male    DOB: Jul 08, 1951, 61 y.o.   MRN: [id number]  HPI IFG- Fasting sugars in AM No hypoglcyemic events.  Weight is stable. No regular exercise  HTN - No CP or SOB.  Has been veyr active.    Af-fib. Here for coumadin check.    He also noticed a lot of redness on his right lower leg. He also has some scabs. Typically they do stay swollen and that has been stable it has not worsened but the redness has gotten worse and is worried about getting infected with MRSA again. He has had recurrent MRSA.  Review of Systems     Objective:   Physical Exam  Constitutional: He is oriented to person, place, and time. He appears well-developed and well-nourished.  HENT:  Head: Normocephalic and atraumatic.  Cardiovascular: Normal rate, regular rhythm and normal heart sounds.   Pulmonary/Chest: Effort normal and breath sounds normal.  Neurological: He is alert and oriented to person, place, and time.  Skin: Skin is warm and dry.  Psychiatric: He has a normal mood and affect. His behavior is normal.    1+ pitting edema of the lower extremities. He does have significant erythema around the right lower extremity with some scabs. No active oozing or drainage. Some mild erythema also on the left partially.      Assessment & Plan:  Elevated glucose - WE checked his A1c and is it completely normal. No sign of IFG. Can stop checking sugars at home. IFG removed from problem list.  HTN - repeat blood pressure was fantastic. It also sounds like his home blood pressures are well controlled. Continue current regimen. Followup in 6 months.  Cellulitis on right lower extremity-it is mild sore to go ahead and treat with Bactrim. Call if not significantly improved. May need to wear compression stockings. The he likely not. Also has chronic venous stasis.   Atrial fibrillation-see Coumadin flow sheet for adjustments to Coumadin.  He also notes numbness in the 4th and  5th digits on the left hand. Started a couple of weeks ago. Comes an dgoes.  No know triggers or alleviating factos. Will occ take IBU.  He said he did wake up a couple weeks ago with a sore neck but hasn't really bothered him since then. I encouraged him to keep an eye on it. Acute and inflammatory to a minimum because of his blood pressure and if it is not going away the next 3-4 weeks and please let me know and we'll consider further evaluation with an EMG study. Especially since he's not having any significant neck pain or elbow pain.  Vaccine is up-to-date.

## 2012-09-11 ENCOUNTER — Ambulatory Visit: Payer: Self-pay

## 2012-10-02 ENCOUNTER — Ambulatory Visit (INDEPENDENT_AMBULATORY_CARE_PROVIDER_SITE_OTHER): Payer: Self-pay | Admitting: Family Medicine

## 2012-10-02 VITALS — BP 148/88 | HR 83

## 2012-10-02 DIAGNOSIS — I4891 Unspecified atrial fibrillation: Secondary | ICD-10-CM

## 2012-10-03 ENCOUNTER — Telehealth: Payer: Self-pay | Admitting: *Deleted

## 2012-10-03 NOTE — Telephone Encounter (Signed)
Left message on vm with dr. instructions

## 2012-10-24 ENCOUNTER — Other Ambulatory Visit: Payer: Self-pay | Admitting: *Deleted

## 2012-10-24 MED ORDER — WARFARIN SODIUM 5 MG PO TABS: 5.0000 mg | ORAL_TABLET | Freq: Every day | ORAL | Status: DC

## 2012-11-03 ENCOUNTER — Ambulatory Visit (INDEPENDENT_AMBULATORY_CARE_PROVIDER_SITE_OTHER): Payer: Self-pay | Admitting: Family Medicine

## 2012-11-03 VITALS — BP 130/82 | HR 77

## 2012-11-03 DIAGNOSIS — I4891 Unspecified atrial fibrillation: Secondary | ICD-10-CM

## 2012-11-03 LAB — POCT INR: INR: 2.9

## 2012-11-04 ENCOUNTER — Telehealth: Payer: Self-pay | Admitting: *Deleted

## 2012-11-04 NOTE — Telephone Encounter (Signed)
Pt notified of coumadin instructions and advised to come for recheck in 2 weeks

## 2012-11-11 ENCOUNTER — Encounter: Payer: Self-pay | Admitting: Family Medicine

## 2012-11-11 ENCOUNTER — Ambulatory Visit (INDEPENDENT_AMBULATORY_CARE_PROVIDER_SITE_OTHER): Payer: Self-pay | Admitting: Family Medicine

## 2012-11-11 VITALS — BP 141/83 | HR 88 | Wt 299.0 lb

## 2012-11-11 DIAGNOSIS — I4891 Unspecified atrial fibrillation: Secondary | ICD-10-CM

## 2012-11-11 DIAGNOSIS — L0390 Cellulitis, unspecified: Secondary | ICD-10-CM

## 2012-11-11 MED ORDER — SULFAMETHOXAZOLE-TRIMETHOPRIM 800-160 MG PO TABS: ORAL_TABLET | ORAL | Status: AC

## 2012-11-11 MED ORDER — LIDOCAINE HCL 1 % IJ SOLN
250.0000 mg | Freq: Once | INTRAMUSCULAR | Status: AC
  Administered 2012-11-11: 250 mg via INTRAMUSCULAR

## 2012-11-11 NOTE — Progress Notes (Signed)
CC: Jerome Hicks is a 61 y.o. male is here for MRSA and Coagulation Disorder   Subjective: HPI:  INR Concerns, 2.9 last week: Coumadin 5mg  tabs Take whole tab on Tuesday, Thursday, and Saturday. Half tab all other days. INR 2.4 today.  Denies bleeding, bruising, nor recent missed doses. Denies motor or sensory disturbances, shortness of breath.   On Friday noticed redness in the medial right knee, progressing to a sharp pain with slight radiation up the proximal medial thigh. Pain is worsened with palpation, no pain with movement of the knee or walking or bearing weight. Sensation in appearance are identical to her MRSA infection in the groin months ago. No interventions as of yet. He denies fevers, chills, nausea, vomiting, groin pain, nor unilateral leg swelling.April 2012 wound culture grew out MRSA sensitive to [employer].    Review Of Systems Outlined In HPI  Past Medical History  Diagnosis Date  . Kidney stones   . Asbestosis     get yearly PFT's  . ED (erectile dysfunction)     has been prescribed Levitra and Viagrain in the past  . Scrotal abscess 3-03  . Leg edema     recurrent cellulitis  . Internal hemorrhoids   . Sigmoid diverticulosis   . Atrial fibrillation   . Hypertension      Family History  Problem Relation Age of Onset  . Heart attack Mother 75  . Heart attack Father 58  . Heart attack Sister 29  . Heart attack Brother 43  . Multiple sclerosis Daughter      History  Substance Use Topics  . Smoking status: Never Smoker   . Smokeless tobacco: Not on file  . Alcohol Use: No     Objective: Filed Vitals:   11/11/12 0827  BP: 141/83  Pulse: 88    General: Alert and Oriented, No Acute Distress HEENT: Pupils equal, round, reactive to light. Conjunctivae clear.  Moist mucous membranes, Neck supple without palpable lymphadenopathy nor abnormal masses. Lungs: Comfortable work of breathing, no accessory muscle use  Cardiac:  Regular rate and rhythm. Extremities: 1+ nonpitting ankle/shin edema  Strong peripheral pulses.  Mental Status: No depression, anxiety, nor agitation. Skin: Warm and dry. There is a teardrop sized patch of erythema measuring 4 cm x 2 cm on the left medial knee, with a central pustule and blackness.  Assessment & Plan: [name] was seen today for mrsa and coagulation disorder.  Diagnoses and associated orders for this visit:  Atrial fibrillation - POCT INR  Cellulitis - cefTRIAXone (ROCEPHIN) 250 mg in lidocaine (XYLOCAINE) 1 % IM only syringe; Inject 1 mL (250 mg total) into the muscle once. - sulfamethoxazole-trimethoprim (SEPTRA DS) 800-160 MG per tablet; One by mouth twice a day for ten days.    Performed bedside ultrasound which did not show any pus or fluid loculation requiring drainage, patient requested the lesion be unroofed which was on the 25-gauge needle after prepping the site with Betadine. No discharge or purulence was expressed after unroofing the lesion. He received a dose of Rocephin, will start Bactrim, outline the periphery of erythema and if enlarging after 48 hours I've asked him to call the office as soon as possible for antibiotic change. He's not interested in doxycycline right now as Bactrim will be much cheaper and has worked in the past. Discussed use of warm compresses to 4 times a day Atrial fibrillation:: INR therapeutic no change to regimen return in 4 weeks for repeat INR.  25 minutes  spent face-to-face during visit today of which at least 50% was counseling or coordinating care.   Return if symptoms worsen or fail to improve.

## 2012-12-09 ENCOUNTER — Ambulatory Visit: Payer: Self-pay

## 2012-12-18 ENCOUNTER — Other Ambulatory Visit: Payer: Self-pay | Admitting: *Deleted

## 2012-12-18 MED ORDER — DOXAZOSIN MESYLATE 4 MG PO TABS: 4.0000 mg | ORAL_TABLET | Freq: Every day | ORAL | Status: DC

## 2012-12-18 MED ORDER — CARVEDILOL 25 MG PO TABS: 25.0000 mg | ORAL_TABLET | Freq: Two times a day (BID) | ORAL | Status: DC

## 2012-12-18 MED ORDER — TRAMADOL HCL 50 MG PO TABS: 50.0000 mg | ORAL_TABLET | Freq: Three times a day (TID) | ORAL | Status: DC | PRN

## 2012-12-29 ENCOUNTER — Ambulatory Visit: Payer: Self-pay | Admitting: Family Medicine

## 2012-12-29 ENCOUNTER — Encounter: Payer: Self-pay | Admitting: Family Medicine

## 2012-12-29 ENCOUNTER — Ambulatory Visit (INDEPENDENT_AMBULATORY_CARE_PROVIDER_SITE_OTHER): Payer: Self-pay | Admitting: Family Medicine

## 2012-12-29 VITALS — BP 133/84 | HR 78 | Wt 298.0 lb

## 2012-12-29 DIAGNOSIS — I4891 Unspecified atrial fibrillation: Secondary | ICD-10-CM

## 2012-12-29 DIAGNOSIS — R220 Localized swelling, mass and lump, head: Secondary | ICD-10-CM

## 2012-12-29 DIAGNOSIS — A4902 Methicillin resistant Staphylococcus aureus infection, unspecified site: Secondary | ICD-10-CM

## 2012-12-29 MED ORDER — SULFAMETHOXAZOLE-TRIMETHOPRIM 800-160 MG PO TABS: 2.0000 | ORAL_TABLET | Freq: Two times a day (BID) | ORAL | Status: DC

## 2012-12-29 NOTE — Progress Notes (Signed)
  Subjective:    Patient ID: Jerome Hicks, male    DOB: [birth year], 62 y.o.   MRN: [id number]  HPI MRSA in nose- his symptoms started 4 days ago on Friday. He noticed a small tender bump on his right nostril. He said he started getting pain towards his gums and upper lip and noticed that the swelling. Today he has swelling over the right jaw line and right facial cheeks. Tender to touch. He denies any actual bone pain except right below his nose. He denies any tooth pain. No problems swallowing. He says he just feels achy all over. Not sure of having any fevers. 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 No other worsening or alleviating symptoms. He has not been really taking anything for pain control.   Review of Systems     Objective:   Physical Exam  Constitutional: He is oriented to person, place, and time. He appears well-developed and well-nourished.  HENT:       Right facial cheek with sig swelling and tenderness. No erythema.  There is a small approximately half centimeter abscess in the right nostril. No active drainage.  Neck: Neck supple. No thyromegaly present.  Lymphadenopathy:    He has no cervical adenopathy.  Neurological: He is alert and oriented to person, place, and time.  Skin: Skin is warm and dry.  Psychiatric: He has a normal mood and affect. His behavior is normal.    He does have a lot of facial hair which make it difficult to get a good exam.     Assessment & Plan:  Small abscess in right nostril-it's really not a good location to lance. We'll go ahead and place him on Bactrim double-stranded 2 tabs the morning 2 tabs in the evening. He is to call in a couple days it is not noticing a prescription that. Also the swelling in the right side of the face gets worse then please call the office back. I see no erythema but it is swollen and tender. He denies any actual tooth or dental pain.  Atrial fibrillation-see anticoagulation flowsheet. I want him to  the antibiotic will treat with his Coumadin. Recommend repeat INR check in 2 weeks.

## 2013-01-12 ENCOUNTER — Ambulatory Visit (INDEPENDENT_AMBULATORY_CARE_PROVIDER_SITE_OTHER): Payer: Self-pay | Admitting: Family Medicine

## 2013-01-12 VITALS — BP 137/79 | HR 62

## 2013-01-12 DIAGNOSIS — I4891 Unspecified atrial fibrillation: Secondary | ICD-10-CM

## 2013-01-12 LAB — POCT INR: INR: 2.4

## 2013-01-12 NOTE — Progress Notes (Signed)
  Subjective:    Patient ID: Jerome Hicks, male    DOB: [birth year], 62 y.o.   MRN: [id number] INR-2.4.  Pt could not remember his dosage.  He has it written down at home.  HPI    Review of Systems     Objective:   Physical Exam        Assessment & Plan:

## 2013-01-13 NOTE — Progress Notes (Signed)
  Subjective:    Patient ID: Jerome Hicks, male    DOB: 1951/11/24, 62 y.o.   MRN: [id number]  HPI    Review of Systems     Objective:   Physical Exam        Assessment & Plan:  Pt notified of coumadin instructions. Verbalized understanding. KG LPN

## 2013-01-27 ENCOUNTER — Ambulatory Visit (INDEPENDENT_AMBULATORY_CARE_PROVIDER_SITE_OTHER): Payer: Self-pay | Admitting: Family Medicine

## 2013-01-27 VITALS — BP 129/75 | HR 90

## 2013-01-27 DIAGNOSIS — I4891 Unspecified atrial fibrillation: Secondary | ICD-10-CM

## 2013-01-27 LAB — POCT INR: INR: 2.2

## 2013-01-27 NOTE — Progress Notes (Signed)
  Subjective:    Patient ID: MCCLAIN SHALL, male    DOB: 1951/04/30, 62 y.o.   MRN: [id number]  HPI    Review of Systems     Objective:   Physical Exam        Assessment & Plan:  Patient notified to continue same dose of Coumadin and recheck 4 weeks. Sent to schedule appt. Barry Dienes, LPN

## 2013-02-23 ENCOUNTER — Ambulatory Visit (INDEPENDENT_AMBULATORY_CARE_PROVIDER_SITE_OTHER): Payer: Self-pay | Admitting: Family Medicine

## 2013-02-23 VITALS — BP 140/85 | HR 84

## 2013-02-23 DIAGNOSIS — I4891 Unspecified atrial fibrillation: Secondary | ICD-10-CM

## 2013-02-23 LAB — POCT INR: INR: 2.8

## 2013-02-23 NOTE — Progress Notes (Signed)
  Subjective:    Patient ID: Jerome Hicks, male    DOB: Jul 16, 1951, 61 y.o.   MRN: [id number]  HPI    Review of Systems     Objective:   Physical Exam        Assessment & Plan:  Patient advised to continue same dose and recheck in 4 weeks.

## 2013-02-24 ENCOUNTER — Ambulatory Visit: Payer: Self-pay

## 2013-03-23 ENCOUNTER — Telehealth: Payer: Self-pay | Admitting: *Deleted

## 2013-03-23 ENCOUNTER — Ambulatory Visit: Payer: Self-pay | Admitting: Family Medicine

## 2013-03-23 ENCOUNTER — Ambulatory Visit (INDEPENDENT_AMBULATORY_CARE_PROVIDER_SITE_OTHER): Payer: Self-pay | Admitting: Family Medicine

## 2013-03-23 VITALS — BP 151/82 | HR 87

## 2013-03-23 DIAGNOSIS — I4891 Unspecified atrial fibrillation: Secondary | ICD-10-CM

## 2013-03-23 LAB — POCT INR: INR: 2.4

## 2013-03-23 NOTE — Progress Notes (Signed)
No bleeding or bruising.Heath Gold'

## 2013-03-23 NOTE — Telephone Encounter (Signed)
Called pt and lvm with the following instructions:  Same dose. Take whole tab on Tuesday, Thursday, and Saturday. Half tab all other days. Recheck in 4 weeks

## 2013-03-26 ENCOUNTER — Encounter: Payer: Self-pay | Admitting: Family Medicine

## 2013-03-26 ENCOUNTER — Ambulatory Visit (INDEPENDENT_AMBULATORY_CARE_PROVIDER_SITE_OTHER): Payer: Self-pay | Admitting: Family Medicine

## 2013-03-26 VITALS — BP 136/82 | HR 75 | Wt 294.0 lb

## 2013-03-26 DIAGNOSIS — I878 Other specified disorders of veins: Secondary | ICD-10-CM

## 2013-03-26 DIAGNOSIS — L0390 Cellulitis, unspecified: Secondary | ICD-10-CM

## 2013-03-26 DIAGNOSIS — I10 Essential (primary) hypertension: Secondary | ICD-10-CM

## 2013-03-26 DIAGNOSIS — A4902 Methicillin resistant Staphylococcus aureus infection, unspecified site: Secondary | ICD-10-CM

## 2013-03-26 DIAGNOSIS — I8310 Varicose veins of unspecified lower extremity with inflammation: Secondary | ICD-10-CM

## 2013-03-26 DIAGNOSIS — I872 Venous insufficiency (chronic) (peripheral): Secondary | ICD-10-CM

## 2013-03-26 DIAGNOSIS — B9562 Methicillin resistant Staphylococcus aureus infection as the cause of diseases classified elsewhere: Secondary | ICD-10-CM

## 2013-03-26 LAB — BASIC METABOLIC PANEL
CO2: 27 mEq/L (ref 19–32)
Calcium: 9 mg/dL (ref 8.4–10.5)
Creat: 0.81 mg/dL (ref 0.50–1.35)

## 2013-03-26 MED ORDER — MUPIROCIN 2 % EX OINT: TOPICAL_OINTMENT | Freq: Two times a day (BID) | CUTANEOUS | Status: DC

## 2013-03-26 MED ORDER — SULFAMETHOXAZOLE-TRIMETHOPRIM 800-160 MG PO TABS: 2.0000 | ORAL_TABLET | Freq: Two times a day (BID) | ORAL | Status: DC

## 2013-03-26 MED ORDER — TRIAMCINOLONE ACETONIDE 0.5 % EX OINT: TOPICAL_OINTMENT | Freq: Two times a day (BID) | CUTANEOUS | Status: DC

## 2013-03-26 NOTE — Progress Notes (Signed)
  Subjective:    Patient ID: Jerome Hicks, male    DOB: 1951-04-14, 62 y.o.   MRN: [id number]  HPI Lesion on bilateral thighs and , right posterior lower leg, left buttocks.    Has been SOB recently and has been more tired recently. No change in swelling.  Does have history of asbestosis.  Will get SOB with mowing his yard.  Feels better after 10 -15 min. No CP with it. Due for CT and pulm function studies in June.  He does have a -fib.   HTN-  Pt denies chest pain, SOB, dizziness, or heart palpitations.  Taking meds as directed w/o problems.  Denies medication side effects.    Review of Systems     Objective:   Physical Exam  Constitutional: He is oriented to person, place, and time. He appears well-developed and well-nourished.  HENT:  Head: Normocephalic and atraumatic.  Neck: Neck supple. No thyromegaly present.  Cardiovascular: Normal rate, regular rhythm and normal heart sounds.   No carotid brutis. Radial pulses 2+   Pulmonary/Chest: Effort normal and breath sounds normal.  Musculoskeletal: He exhibits edema.  1+ pitting edema on thr right LE, trace on the left.   Lymphadenopathy:    He has no cervical adenopathy.  Neurological: He is alert and oriented to person, place, and time.  Skin: Skin is warm and dry.  Large pustule with surrounding erythema on the post right calf.  1 lesion on each inner thigh that looks like it is starting to heal with a scab in the center.  He has venous stasis dermatitis on both LE extemities. He is weeping serous fluid on the right leg and has about 3 ulcerated areas on teh front where it looks like the scabs have been removed.    Psychiatric: He has a normal mood and affect. His behavior is normal.          Assessment & Plan:  MRSA - nothing to I & D today  Some cellulitis around the one on posterior right lower leg. Will start oral bactrim.  Call if suddenly getting worse or not better in a week. Also given a prescription for Bactroban  ointment. We did fill this for him about 2 years ago but he ran out quite some time ago. Encouraged him to use this immediately when he first sees a new lesion occur.   Venous stasis - Really needs compression stocking. He is Re: on Lasix Him he really needs compression to help keep the fluid in. He says it's like liquid in the morning but as the day goes on they get more and more swollen. Also continued to make sure watching salt intake. Continue to work on weight loss as this will make a big improvement in his swelling.  Venous stasis dermatitis-given a prescription for topical steroid cream this typically does help. Recommend use for short period time and then get a scan a break.  HTN - WEll controlled.  Continue current regimen. Continue to work on weight loss and diet. Due for BMP since he is on Lasix chronically.

## 2013-03-27 NOTE — Progress Notes (Signed)
Quick Note:  All labs are normal. ______ 

## 2013-04-21 ENCOUNTER — Telehealth: Payer: Self-pay | Admitting: *Deleted

## 2013-04-21 ENCOUNTER — Ambulatory Visit (INDEPENDENT_AMBULATORY_CARE_PROVIDER_SITE_OTHER): Payer: Self-pay | Admitting: Sports Medicine

## 2013-04-21 VITALS — BP 137/83 | HR 88 | Wt 296.0 lb

## 2013-04-21 DIAGNOSIS — I4891 Unspecified atrial fibrillation: Secondary | ICD-10-CM

## 2013-04-21 LAB — POCT INR: INR: 2.2

## 2013-04-21 NOTE — Progress Notes (Signed)
I was present for all essential parts of this visit and procedure.  Thomas J. Thekkekandam, M.D.   

## 2013-04-21 NOTE — Telephone Encounter (Signed)
Same dose. Take whole tab on Tuesday, Thursday, and Saturday. Half tab all other days. Recheck in 4 week.    Laureen Ochs, Viann Shove

## 2013-05-25 ENCOUNTER — Encounter: Payer: Self-pay | Admitting: Family Medicine

## 2013-05-26 ENCOUNTER — Encounter: Payer: Self-pay | Admitting: Family Medicine

## 2013-05-26 ENCOUNTER — Ambulatory Visit (INDEPENDENT_AMBULATORY_CARE_PROVIDER_SITE_OTHER): Payer: Medicare HMO | Admitting: Family Medicine

## 2013-05-26 ENCOUNTER — Telehealth: Payer: Self-pay | Admitting: Family Medicine

## 2013-05-26 VITALS — BP 148/86 | HR 74 | Ht 75.0 in | Wt 295.0 lb

## 2013-05-26 DIAGNOSIS — I10 Essential (primary) hypertension: Secondary | ICD-10-CM

## 2013-05-26 DIAGNOSIS — Z0000 Encounter for general adult medical examination without abnormal findings: Secondary | ICD-10-CM

## 2013-05-26 DIAGNOSIS — IMO0001 Reserved for inherently not codable concepts without codable children: Secondary | ICD-10-CM

## 2013-05-26 DIAGNOSIS — L97909 Non-pressure chronic ulcer of unspecified part of unspecified lower leg with unspecified severity: Secondary | ICD-10-CM

## 2013-05-26 DIAGNOSIS — I4891 Unspecified atrial fibrillation: Secondary | ICD-10-CM

## 2013-05-26 DIAGNOSIS — K148 Other diseases of tongue: Secondary | ICD-10-CM

## 2013-05-26 LAB — LIPID PANEL
Cholesterol: 115 mg/dL (ref 0–200)
HDL: 44 mg/dL (ref >39)
Total CHOL/HDL Ratio: 2.6 Ratio

## 2013-05-26 LAB — HEMOGLOBIN A1C: Mean Plasma Glucose: 94 mg/dL (ref <117)

## 2013-05-26 MED ORDER — FUROSEMIDE 40 MG PO TABS: 80.0000 mg | ORAL_TABLET | ORAL | Status: DC | PRN

## 2013-05-26 MED ORDER — VARDENAFIL HCL 10 MG PO TABS: 10.0000 mg | ORAL_TABLET | ORAL | Status: DC | PRN

## 2013-05-26 MED ORDER — POTASSIUM CHLORIDE ER 10 MEQ PO TBCR: 10.0000 meq | EXTENDED_RELEASE_TABLET | ORAL | Status: DC | PRN

## 2013-05-26 MED ORDER — AMLODIPINE-OLMESARTAN 10-40 MG PO TABS: 1.0000 | ORAL_TABLET | Freq: Every day | ORAL | Status: DC

## 2013-05-26 MED ORDER — TRAMADOL HCL 50 MG PO TABS: 50.0000 mg | ORAL_TABLET | Freq: Three times a day (TID) | ORAL | Status: DC | PRN

## 2013-05-26 MED ORDER — CARVEDILOL 25 MG PO TABS: 25.0000 mg | ORAL_TABLET | Freq: Two times a day (BID) | ORAL | Status: DC

## 2013-05-26 MED ORDER — DOXAZOSIN MESYLATE 4 MG PO TABS: 4.0000 mg | ORAL_TABLET | Freq: Every day | ORAL | Status: DC

## 2013-05-26 NOTE — Progress Notes (Signed)
Subjective:    Jerome Hicks is a 62 y.o. male who presents for Medicare Annual/Subsequent preventive examination.   Preventive Screening-Counseling & Management  Tobacco History  Smoking status  . Never Smoker   Smokeless tobacco  . Not on file    Problems Prior to Visit 1. lesion on left side of tongue has been there for over a year. It feels sore and says that also that part of his tongue feels numb. He does chew tobacco. No trauma or injury. He does not think it's gotten larger in the last year. 2. he has several ulcerations on his right lower leg on top of his venous stasis. We have had to wrap these in the past with inability to. He is requesting to have another  On his right lower leg.  Current Problems (verified) Patient Active Problem List   Diagnosis Date Noted  . Atrial fibrillation 06/25/2012  . BMI 36.0-36.9,adult 06/19/2012  . 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 RESTRICTIVE LUNG DISEASE 01/12/2011  . BRADYCARDIA 05/26/2008  . ERECTILE DYSFUNCTION 01/06/2008  . ESSENTIAL HYPERTENSION, BENIGN 01/05/2008  . UNSPECIFIED VENOUS INSUFFICIENCY 01/05/2008    Medications Prior to Visit Current Outpatient Prescriptions on File Prior to Visit  Medication Sig Dispense Refill  . amLODipine-olmesartan (AZOR) 10-40 MG per tablet Take 1 tablet by mouth daily.  30 tablet  6  . carvedilol (COREG) 25 MG tablet Take 1 tablet (25 mg total) by mouth 2 (two) times daily.  60 tablet  3  . doxazosin (CARDURA) 4 MG tablet Take 1 tablet (4 mg total) by mouth daily.  30 tablet  3  . furosemide (LASIX) 40 MG tablet Take 80 mg by mouth as needed.       . meclizine (ANTIVERT) 25 MG tablet TAKE ONE TABLET BY MOUTH THREE TIMES DAILY AS NEEDED FOR DIZZINESS OR NAUSEA AS DIRECTED  30 tablet  0  . potassium chloride (K-DUR) 10 MEQ tablet Take 10 mEq by mouth as needed. Take w/ fluid pill      . traMADol (ULTRAM) 50 MG tablet Take 1 tablet (50 mg total) by mouth every 8 (eight) hours as needed  for pain.  90 tablet  0  . vardenafil (LEVITRA) 10 MG tablet Take 10 mg by mouth as needed.       . warfarin (COUMADIN) 5 MG tablet Take 1 tablet (5 mg total) by mouth daily.  90 tablet  5   No current facility-administered medications on file prior to visit.    Current Medications (verified) Current Outpatient Prescriptions  Medication Sig Dispense Refill  . amLODipine-olmesartan (AZOR) 10-40 MG per tablet Take 1 tablet by mouth daily.  30 tablet  6  . carvedilol (COREG) 25 MG tablet Take 1 tablet (25 mg total) by mouth 2 (two) times daily.  60 tablet  3  . doxazosin (CARDURA) 4 MG tablet Take 1 tablet (4 mg total) by mouth daily.  30 tablet  3  . furosemide (LASIX) 40 MG tablet Take 80 mg by mouth as needed.       . meclizine (ANTIVERT) 25 MG tablet TAKE ONE TABLET BY MOUTH THREE TIMES DAILY AS NEEDED FOR DIZZINESS OR NAUSEA AS DIRECTED  30 tablet  0  . potassium chloride (K-DUR) 10 MEQ tablet Take 10 mEq by mouth as needed. Take w/ fluid pill      . traMADol (ULTRAM) 50 MG tablet Take 1 tablet (50 mg total) by mouth every 8 (eight) hours as needed for pain.  90  tablet  0  . vardenafil (LEVITRA) 10 MG tablet Take 10 mg by mouth as needed.       . warfarin (COUMADIN) 5 MG tablet Take 1 tablet (5 mg total) by mouth daily.  90 tablet  5   No current facility-administered medications for this visit.     Allergies (verified) Penicillins   PAST HISTORY  Family History Family History  Problem Relation Age of Onset  . Heart attack Mother 39  . Heart attack Father 47  . Heart attack Sister 76  . Heart attack Brother 43  . Multiple sclerosis Daughter     Social History History  Substance Use Topics  . Smoking status: Never Smoker   . Smokeless tobacco: Not on file  . Alcohol Use: No    Are there smokers in your home (other than you)?  No  Risk Factors Current exercise habits: very physically active  Dietary issues discussed: none   Cardiac risk factors: advanced age (older  than 58 for men, 55 for women), hypertension, male gender and obesity (BMI >= 30 kg/m2).  Depression Screen (Note: if answer to either of the following is "Yes", a more complete depression screening is indicated)   Q1: Over the past two weeks, have you felt down, depressed or hopeless? No  Q2: Over the past two weeks, have you felt little interest or pleasure in doing things? No  Have you lost interest or pleasure in daily life? No  Do you often feel hopeless? No  Do you cry easily over simple problems? No  Activities of Daily Living In your present state of health, do you have any difficulty performing the following activities?:  Driving? No Managing money?  No Feeding yourself? No Getting from bed to chair? No Climbing a flight of stairs? Yes Preparing food and eating?: No Bathing or showering? No Getting dressed: No Getting to the toilet? No Using the toilet:No Moving around from place to place: No In the past year have you fallen or had a near fall?:No   Are you sexually active?  No  Do you have more than one partner?  No  Hearing Difficulties: Yes Do you often ask people to speak up or repeat themselves? Yes Do you experience ringing or noises in your ears? Yes Do you have difficulty understanding soft or whispered voices? Yes   Do you feel that you have a problem with memory? No  Do you often misplace items? No  Do you feel safe at home?  Yes  Cognitive Testing  Alert? Yes  Normal Appearance?Yes  Oriented to person? Yes  Place? Yes   Time? Yes  Recall of three objects?  Yes  Can perform simple calculations? Yes  Displays appropriate judgment?Yes  Can read the correct time from a watch face?Yes   Advanced Directives have been discussed with the patient? Yes   List the Names of Other Physician/Practitioners you currently use: 1.    Indicate any recent Medical Services you may have received from other than [facility] providers in the past year (date may be  approximate).  Immunization History  Administered Date(s) Administered  . Influenza Split 08/21/2012  . Influenza Whole 08/27/2008, 11/07/2009, 10/18/2010, 08/20/2011  . Td 12/03/1994, 06/15/2009  . Zoster 06/18/2011    Screening Tests Health Maintenance  Topic Date Due  . Influenza Vaccine  08/03/2013  . Colonoscopy  05/06/2016  . Tetanus/tdap  06/16/2019  . Zostavax  Completed    All answers were reviewed with the patient and  necessary referrals were made:  METHENEY,CATHERINE, MD   05/26/2013   History reviewed: allergies, current medications, past family history, past medical history, past social history, past surgical history and problem list  Review of Systems A comprehensive review of systems was negative.    Objective:     Vision by Snellen chart: right eye:20/20, left eye:20/25 Blood pressure 148/86, pulse 74, weight 295 lb (133.811 kg). Body mass index is 36.87 kg/(m^2).  BP 148/86  Pulse 74  Ht 6\' 3"  (1.905 m)  Wt 295 lb (133.811 kg)  BMI 36.87 kg/m2  General Appearance:    Alert, cooperative, no distress, appears stated age, obese  Head:    Normocephalic, without obvious abnormality, atraumatic  Eyes:    PERRL, conjunctiva/corneas clear, EOM's intact both eyes       Ears:    Normal TM's and external ear canals, both ears  Nose:   Nares normal, septum midline, mucosa normal, no drainage    or sinus tenderness  Throat:   Lips, mucosa, and tongue normal; teeth and gums normal  Neck:   Supple, symmetrical, trachea midline, no adenopathy;       thyroid:  No enlargement/tenderness/nodules; no carotid   bruit or JVD  Back:     Symmetric, no curvature, ROM normal, no CVA tenderness  Lungs:     Clear to auscultation bilaterally, respirations unlabored  Chest wall:    No tenderness or deformity  Heart:    Regular rate and rhythm, S1 and S2 normal, no murmur, rub   or gallop  Abdomen:     Soft, non-tender, bowel sounds active all four quadrants,    no masses, no  organomegaly  Genitalia:    Not perfromed  Rectal:    Not performed   Extremities:   Extremities  With 2+ edema, worse on the right. 2 ulcers on right lower leg over the shin.  Approx 2 cm round each with shallow base.  Not active infection, cellulitis or oozing from wounds.   Pulses:   2+ and symmetric all extremities  Skin:   Skin color, texture, turgor normal, no rashes or lesions  Lymph nodes:   Cervical, supraclavicular, and axillary nodes normal  Neurologic:   CNII-XII intact. Normal strength, sensation and reflexes      throughout       Assessment:     Annual Wellness Exam      Plan:     During the course of the visit the patient was educated and counseled about appropriate screening and preventive services including:    Needs UTD eye exam  Tongue lesion-will refer to ENT for further evaluation. Because he is a tobacco user I really think this should be biopsied to make sure that it's not oral cancer.  Venous stasis ulcers-will wrap was in a boot today. Remove in 3 days. Needs to start wearing compression stocking again.    Hypertension-will check CMP, fasting lipid panel and hemoglobin A1c. Blood pressure slightly elevated today.  Encouraged him to get a full eye exam sometime this year.  EKG shows rate of 80 bpm, afib.    Diet review for nutrition referral? Yes ____  Not Indicated __x_   Patient Instructions (the written plan) was given to the patient.  Medicare Attestation I have personally reviewed: The patient's medical and social history Their use of alcohol, tobacco or illicit drugs Their current medications and supplements The patient's functional ability including ADLs,fall risks, home safety risks, cognitive, and hearing and visual impairment Diet  and physical activities Evidence for depression or mood disorders  The patient's weight, height, BMI, and visual acuity have been recorded in the chart.  I have made referrals, counseling, and provided  education to the patient based on review of the above and I have provided the patient with a written personalized care plan for preventive services.     METHENEY,CATHERINE, MD   05/26/2013

## 2013-05-26 NOTE — Telephone Encounter (Signed)
Call lab and see if they can add a PSA to his blood work that was drawn this morning.

## 2013-05-26 NOTE — Patient Instructions (Addendum)
Try get an  Eye exam this year.   Tongue lesion-will refer to ENT for further evaluation. There are 3 new drugs on the market that can be used for atrial fibrillation so help thin the blood. They are Pradaxa, Xarelto, and Eliquis. He can certainly check with your insurance company to see if these are covered or not. If they are in you would like to try one then we can always send it through to the pharmacy to find out from its the cost would be. I will see back in 6 months to recheck your blood pressure. We will call you with the lab results once they're available. He can remove the wrappings from your Hyndman boot after 3 days.

## 2013-05-27 LAB — COMPLETE METABOLIC PANEL WITH GFR
Alkaline Phosphatase: 73 U/L (ref 39–117)
BUN: 13 mg/dL (ref 6–23)
CO2: 27 mEq/L (ref 19–32)
GFR, Est African American: >89 mL/min
GFR, Est Non African American: >89 mL/min
Glucose, Bld: 119 mg/dL — ABNORMAL HIGH (ref 70–99)
Total Bilirubin: 2.4 mg/dL — ABNORMAL HIGH (ref 0.3–1.2)

## 2013-05-28 ENCOUNTER — Encounter: Payer: Self-pay | Admitting: Family Medicine

## 2013-05-28 DIAGNOSIS — N183 Chronic kidney disease, stage 3 unspecified: Secondary | ICD-10-CM | POA: Insufficient documentation

## 2013-05-29 ENCOUNTER — Ambulatory Visit (INDEPENDENT_AMBULATORY_CARE_PROVIDER_SITE_OTHER): Payer: Medicare HMO | Admitting: Family Medicine

## 2013-05-29 DIAGNOSIS — IMO0001 Reserved for inherently not codable concepts without codable children: Secondary | ICD-10-CM

## 2013-05-29 DIAGNOSIS — I872 Venous insufficiency (chronic) (peripheral): Secondary | ICD-10-CM

## 2013-05-29 DIAGNOSIS — I83009 Varicose veins of unspecified lower extremity with ulcer of unspecified site: Secondary | ICD-10-CM

## 2013-05-29 NOTE — Progress Notes (Addendum)
  Subjective:    Patient ID: Jerome Hicks, male    DOB: 10/07/1951, 62 y.o.   MRN: [id number]  HPI Here to remove and replace Unna boot today. He denies any pain, fever, chills. The Unna boot was unwrapped and the gauze was discarded. The wound was evaluated and appears to be more shallow and is healing well. No surrounding erythema, induration or drainage. Patient tolerated 3 wrap well. Remove in 3 days. F/u if not completely healing.     Review of Systems     Objective:   Physical Exam  2 ulcers on right lower leg over the shin. Approx 1.5 cm round each with shallow base. Not active infection, cellulitis or oozing from wounds.        Assessment & Plan:  Venous stasis ulcers. New Unna boot was placed today. Can remove after 3 days. If the wound is not completely healed then please return for repeat Unna boot.

## 2013-06-01 ENCOUNTER — Ambulatory Visit (INDEPENDENT_AMBULATORY_CARE_PROVIDER_SITE_OTHER): Payer: Medicare HMO | Admitting: Family Medicine

## 2013-06-01 ENCOUNTER — Encounter: Payer: Self-pay | Admitting: Family Medicine

## 2013-06-01 VITALS — BP 115/73 | HR 84 | Wt 290.0 lb

## 2013-06-01 DIAGNOSIS — B9562 Methicillin resistant Staphylococcus aureus infection as the cause of diseases classified elsewhere: Secondary | ICD-10-CM

## 2013-06-01 DIAGNOSIS — A4902 Methicillin resistant Staphylococcus aureus infection, unspecified site: Secondary | ICD-10-CM

## 2013-06-01 DIAGNOSIS — L0390 Cellulitis, unspecified: Secondary | ICD-10-CM

## 2013-06-01 MED ORDER — SULFAMETHOXAZOLE-TMP DS 800-160 MG PO TABS: 2.0000 | ORAL_TABLET | Freq: Two times a day (BID) | ORAL | Status: DC

## 2013-06-01 NOTE — Addendum Note (Signed)
Addended by: Deno Etienne on: 06/01/2013 04:07 PM   Modules accepted: Orders

## 2013-06-01 NOTE — Progress Notes (Signed)
  Subjective:    Patient ID: Jerome Hicks, male    DOB: [birth year], 62 y.o.   MRN: [id number]  HPI Here for possible mrsa. Has a new pustule that popped upp on posterior right upper thigh. Verytender and painful. No fever, aches or chills. Hx of recurrent MRSA.  Says looked much worse this AM.    Review of Systems     Objective:   Physical Exam  Constitutional: He appears well-developed and well-nourished.  HENT:  Head: Normocephalic and atraumatic.  Skin: Skin is warm and dry. Rash noted.     He has a 4 x 5 cm area of bright red erythema. Surrounding that he has a 10 x 20 cm area of lighter erythema. It was a small pustule in the center. The surface was removed and pus was easily obtained for culture. No underlying abscess.  Psychiatric: He has a normal mood and affect. His behavior is normal.          Assessment & Plan:  MRSA cellulitis - will treat with Bactrim 2 tabs twice a day. Is not noticing some improvement within 48 hours then please come back in a moderately take a look at. If he experiences fever chills or feels like he is getting worse and come in or call sooner. Culture obtained. We'll call with results are available.

## 2013-06-01 NOTE — Patient Instructions (Addendum)
Use Hibiclens scrub for 5 days and then continue twice a week.   Call if the wound is not going down over the next 48 hours.

## 2013-06-04 LAB — WOUND CULTURE

## 2013-06-09 ENCOUNTER — Encounter: Payer: Self-pay | Admitting: *Deleted

## 2013-06-23 ENCOUNTER — Ambulatory Visit (INDEPENDENT_AMBULATORY_CARE_PROVIDER_SITE_OTHER): Payer: Medicare HMO | Admitting: Family Medicine

## 2013-06-23 ENCOUNTER — Encounter: Payer: Self-pay | Admitting: *Deleted

## 2013-06-23 VITALS — BP 141/83 | HR 78 | Wt 292.0 lb

## 2013-06-23 DIAGNOSIS — I4891 Unspecified atrial fibrillation: Secondary | ICD-10-CM

## 2013-06-23 NOTE — Progress Notes (Signed)
  Subjective:    Patient ID: Jerome Hicks, male    DOB: 02/19/1951, 62 y.o.   MRN: [id number] Left detailed message on vm with new dosing instructions. Donne Anon, CMA HPI    Review of Systems     Objective:   Physical Exam        Assessment & Plan:

## 2013-07-06 ENCOUNTER — Encounter: Payer: Self-pay | Admitting: Sports Medicine

## 2013-07-06 ENCOUNTER — Ambulatory Visit (INDEPENDENT_AMBULATORY_CARE_PROVIDER_SITE_OTHER): Payer: Medicare HMO | Admitting: Family Medicine

## 2013-07-06 VITALS — BP 135/80 | HR 83 | Wt 293.0 lb

## 2013-07-06 DIAGNOSIS — I4891 Unspecified atrial fibrillation: Secondary | ICD-10-CM

## 2013-07-06 LAB — POCT INR: INR: 2.8

## 2013-07-06 NOTE — Progress Notes (Signed)
Pt.notified

## 2013-07-06 NOTE — Patient Instructions (Addendum)
Pt.notified

## 2013-07-20 ENCOUNTER — Telehealth: Payer: Self-pay | Admitting: Family Medicine

## 2013-07-20 ENCOUNTER — Ambulatory Visit (INDEPENDENT_AMBULATORY_CARE_PROVIDER_SITE_OTHER): Payer: Medicare HMO | Admitting: Family Medicine

## 2013-07-20 VITALS — BP 136/72 | HR 58 | Wt 292.0 lb

## 2013-07-20 DIAGNOSIS — I4891 Unspecified atrial fibrillation: Secondary | ICD-10-CM

## 2013-07-20 LAB — POCT INR
INR: 2.6
INR: 2.6

## 2013-07-20 NOTE — Progress Notes (Signed)
Left message on patient vm to call be back regarding coumadin dosage.

## 2013-07-20 NOTE — Progress Notes (Signed)
Patient informed of coumadin dosage.

## 2013-07-20 NOTE — Telephone Encounter (Signed)
Patient returned your call.

## 2013-08-04 ENCOUNTER — Encounter: Payer: Self-pay | Admitting: Family Medicine

## 2013-08-04 ENCOUNTER — Ambulatory Visit (INDEPENDENT_AMBULATORY_CARE_PROVIDER_SITE_OTHER): Payer: Medicare HMO | Admitting: Family Medicine

## 2013-08-04 ENCOUNTER — Encounter: Payer: Self-pay | Admitting: *Deleted

## 2013-08-04 VITALS — BP 144/84 | HR 70

## 2013-08-04 DIAGNOSIS — I4891 Unspecified atrial fibrillation: Secondary | ICD-10-CM

## 2013-08-04 LAB — POCT INR: INR: 2.9

## 2013-08-04 NOTE — Progress Notes (Signed)
  Subjective:    Patient ID: Jerome Hicks, male    DOB: [birth year], 62 y.o.   MRN: [id number] Pt notified of dosage instructions. HPI    Review of Systems     Objective:   Physical Exam        Assessment & Plan:

## 2013-08-25 ENCOUNTER — Encounter: Payer: Self-pay | Admitting: *Deleted

## 2013-08-25 ENCOUNTER — Ambulatory Visit (INDEPENDENT_AMBULATORY_CARE_PROVIDER_SITE_OTHER): Payer: Medicare HMO | Admitting: Family Medicine

## 2013-08-25 ENCOUNTER — Telehealth: Payer: Self-pay | Admitting: *Deleted

## 2013-08-25 VITALS — BP 146/91 | HR 80

## 2013-08-25 DIAGNOSIS — I4891 Unspecified atrial fibrillation: Secondary | ICD-10-CM

## 2013-08-25 DIAGNOSIS — Z23 Encounter for immunization: Secondary | ICD-10-CM

## 2013-08-25 LAB — POCT INR: INR: 2.7

## 2013-08-25 MED ORDER — POTASSIUM CHLORIDE ER 10 MEQ PO TBCR: 10.0000 meq | EXTENDED_RELEASE_TABLET | ORAL | Status: DC | PRN

## 2013-08-25 MED ORDER — LOSARTAN POTASSIUM 100 MG PO TABS: 100.0000 mg | ORAL_TABLET | Freq: Every day | ORAL | Status: DC

## 2013-08-25 MED ORDER — AMLODIPINE BESYLATE 10 MG PO TABS: 10.0000 mg | ORAL_TABLET | Freq: Every day | ORAL | Status: DC

## 2013-08-25 MED ORDER — TRAMADOL HCL 50 MG PO TABS: 50.0000 mg | ORAL_TABLET | Freq: Three times a day (TID) | ORAL | Status: DC | PRN

## 2013-08-25 MED ORDER — CARVEDILOL 25 MG PO TABS: 25.0000 mg | ORAL_TABLET | Freq: Two times a day (BID) | ORAL | Status: DC

## 2013-08-25 MED ORDER — WARFARIN SODIUM 5 MG PO TABS: 5.0000 mg | ORAL_TABLET | Freq: Every day | ORAL | Status: DC

## 2013-08-25 MED ORDER — MECLIZINE HCL 25 MG PO TABS: ORAL_TABLET | ORAL | Status: DC

## 2013-08-25 MED ORDER — DOXAZOSIN MESYLATE 4 MG PO TABS: 4.0000 mg | ORAL_TABLET | Freq: Every day | ORAL | Status: DC

## 2013-08-25 MED ORDER — FUROSEMIDE 40 MG PO TABS: 80.0000 mg | ORAL_TABLET | ORAL | Status: DC | PRN

## 2013-08-25 NOTE — Progress Notes (Signed)
  Subjective:    Patient ID: Jerome Hicks, male    DOB: [birth year], 62 y.o.   MRN: [id number] Pt notified of dosing instructions & to return in 3 weeks. Donne Anon, CMA HPI    Review of Systems     Objective:   Physical Exam        Assessment & Plan:

## 2013-08-25 NOTE — Telephone Encounter (Signed)
P came in office requesting all his meds be sent to [facility] as he can get a lot of them free and cheaper also states Dr. Linford Arnold took him off of the Azor and put him on Amlodipine 10mg  and Losartan 100mg  daily.  Per Dr. Linford Arnold this is correct. Printed all rx's and faxed to [facility] at [phone]

## 2013-09-04 ENCOUNTER — Telehealth: Payer: Self-pay | Admitting: *Deleted

## 2013-09-04 NOTE — Telephone Encounter (Signed)
Pt called and wanted to know which meds would be replacing his Azor. I told him that the Amlodipine 10 mg once daily and losartan 100 mg once daily are to be taken instead and I also told him NOT to take the amlodipine and losartan with Azor. To finish taking the Azor and then take the replacement meds. He voiced understanding and agreed. Pt asked that I called him back and left this on his VM to remind him of this change.Loralee Pacas [place]

## 2013-09-15 ENCOUNTER — Encounter: Payer: Self-pay | Admitting: *Deleted

## 2013-09-15 ENCOUNTER — Ambulatory Visit (INDEPENDENT_AMBULATORY_CARE_PROVIDER_SITE_OTHER): Payer: Medicare HMO | Admitting: Family Medicine

## 2013-09-15 VITALS — BP 146/91 | HR 74 | Wt 309.0 lb

## 2013-09-15 DIAGNOSIS — R531 Weakness: Secondary | ICD-10-CM

## 2013-09-15 DIAGNOSIS — I4891 Unspecified atrial fibrillation: Secondary | ICD-10-CM

## 2013-09-15 DIAGNOSIS — R5381 Other malaise: Secondary | ICD-10-CM

## 2013-09-15 LAB — CBC
HCT: 37.6 % — ABNORMAL LOW (ref 39.0–52.0)
Hemoglobin: 13.3 g/dL (ref 13.0–17.0)
MCHC: 35.4 g/dL (ref 30.0–36.0)
MCV: 88.1 fL (ref 78.0–100.0)
Platelets: 216 10*3/uL (ref 150–400)
RBC: 4.27 MIL/uL (ref 4.22–5.81)
RDW: 12.9 % (ref 11.5–15.5)
WBC: 6.8 10*3/uL (ref 4.0–10.5)

## 2013-09-15 LAB — COMPLETE METABOLIC PANEL WITH GFR
Albumin: 3.6 g/dL (ref 3.5–5.2)
Alkaline Phosphatase: 58 U/L (ref 39–117)
BUN: 15 mg/dL (ref 6–23)
CO2: 28 mEq/L (ref 19–32)
Chloride: 107 mEq/L (ref 96–112)
Creat: 0.84 mg/dL (ref 0.50–1.35)
GFR, Est African American: >89 mL/min
GFR, Est Non African American: >89 mL/min
Glucose, Bld: 114 mg/dL — ABNORMAL HIGH (ref 70–99)
Potassium: 4 mEq/L (ref 3.5–5.3)
Sodium: 141 mEq/L (ref 135–145)
Total Bilirubin: 2.2 mg/dL — ABNORMAL HIGH (ref 0.3–1.2)

## 2013-09-15 MED ORDER — ERYTHROMYCIN 5 MG/GM OP OINT: TOPICAL_OINTMENT | Freq: Four times a day (QID) | OPHTHALMIC | Status: DC

## 2013-09-15 NOTE — Progress Notes (Signed)
  Subjective:    Patient ID: Jerome Hicks, male    DOB: 1951/03/31, 62 y.o.   MRN: [id number]  HPI    Review of Systems     Objective:   Physical Exam        Assessment & Plan:  Right eye is pink and swollen. No pain or vison changes. Started a couple of days ago.  Using warm cmpressed. No trauma or injury. Thought had a stye.  Will call in erythro eye onitment.   Also feeling fatidue and weak. BP looks goodl Will check CBC nd CMP Nani Gasser, MD

## 2013-09-15 NOTE — Progress Notes (Signed)
Patient was in office for coumadin check. It was 3.1. Rhonda Cunningham,CMA

## 2013-09-16 ENCOUNTER — Telehealth: Payer: Self-pay | Admitting: Family Medicine

## 2013-09-16 ENCOUNTER — Other Ambulatory Visit: Payer: Self-pay | Admitting: *Deleted

## 2013-09-16 DIAGNOSIS — R5381 Other malaise: Secondary | ICD-10-CM

## 2013-09-16 NOTE — Telephone Encounter (Signed)
Pt informed and will have this done.Jerome Hicks [place]

## 2013-09-16 NOTE — Telephone Encounter (Signed)
Call patient: When he comes back in for his next Coumadin check I would like to update him with a pneumonia vaccine. Does remind him to ask when he comes back in next time

## 2013-09-29 ENCOUNTER — Telehealth: Payer: Self-pay

## 2013-09-29 ENCOUNTER — Ambulatory Visit (INDEPENDENT_AMBULATORY_CARE_PROVIDER_SITE_OTHER): Payer: Medicare HMO | Admitting: Family Medicine

## 2013-09-29 ENCOUNTER — Encounter: Payer: Self-pay | Admitting: *Deleted

## 2013-09-29 ENCOUNTER — Other Ambulatory Visit: Payer: Self-pay

## 2013-09-29 VITALS — BP 116/60 | HR 65 | Wt 306.0 lb

## 2013-09-29 DIAGNOSIS — I4891 Unspecified atrial fibrillation: Secondary | ICD-10-CM

## 2013-09-29 DIAGNOSIS — Z23 Encounter for immunization: Secondary | ICD-10-CM

## 2013-09-29 LAB — POCT INR: INR: 3.5

## 2013-09-29 MED ORDER — SILDENAFIL CITRATE 100 MG PO TABS: 100.0000 mg | ORAL_TABLET | Freq: Every day | ORAL | Status: DC | PRN

## 2013-09-29 NOTE — Progress Notes (Signed)
Spoke to patient gave him the correct coumadin dosage. Rhonda Cunningham,CMA

## 2013-09-29 NOTE — Progress Notes (Signed)
Subjective:     Patient ID: Jerome Hicks, male   DOB: 1951/06/20, 62 y.o.   MRN: [id number]  HPI  Patient was in office for INR check. INR was 3.5. Vielka Klinedinst,CMA   Review of Systems     Objective:   Physical Exam     Assessment:      Plan:

## 2013-09-29 NOTE — Telephone Encounter (Deleted)
Refill request for Viagra  

## 2013-09-29 NOTE — Addendum Note (Signed)
Addended by: Nani Gasser D on: 09/29/2013 05:24 PM   Modules accepted: Orders

## 2013-09-29 NOTE — Telephone Encounter (Signed)
Patient called stated that he went to the pharmacy to pick up his Rx and [place] was called in he request a Rx for Viagra sent to [facility]. Taytum Wheller,CMA

## 2013-09-29 NOTE — Telephone Encounter (Signed)
Ok sent rx so can get his free 3 tabs. He alrady has the coupon card

## 2013-09-30 ENCOUNTER — Telehealth: Payer: Self-pay | Admitting: Family Medicine

## 2013-09-30 NOTE — Telephone Encounter (Signed)
Pt called. Pt states wrong script was called and wants a call back from the nurse.

## 2013-09-30 NOTE — Telephone Encounter (Signed)
Patient notified. Rhonda Cunningham,CMA  

## 2013-09-30 NOTE — Telephone Encounter (Signed)
Pt stated that he was supposed to get viagra and cialis was sent I told him that viagra was sent yesterday.Loralee Pacas [place]

## 2013-10-13 ENCOUNTER — Ambulatory Visit (INDEPENDENT_AMBULATORY_CARE_PROVIDER_SITE_OTHER): Payer: Medicare HMO | Admitting: Family Medicine

## 2013-10-13 VITALS — BP 118/73 | HR 73

## 2013-10-13 DIAGNOSIS — I4891 Unspecified atrial fibrillation: Secondary | ICD-10-CM

## 2013-10-13 LAB — POCT INR: INR: 3

## 2013-10-13 NOTE — Progress Notes (Signed)
Pt.notified

## 2013-11-02 ENCOUNTER — Encounter: Payer: Self-pay | Admitting: *Deleted

## 2013-11-02 ENCOUNTER — Ambulatory Visit (INDEPENDENT_AMBULATORY_CARE_PROVIDER_SITE_OTHER): Payer: Medicare HMO | Admitting: Family Medicine

## 2013-11-02 ENCOUNTER — Ambulatory Visit: Payer: Medicare HMO | Admitting: Family Medicine

## 2013-11-02 VITALS — BP 108/70 | HR 79

## 2013-11-02 DIAGNOSIS — I4891 Unspecified atrial fibrillation: Secondary | ICD-10-CM

## 2013-11-02 NOTE — Progress Notes (Signed)
   Subjective:    Patient ID: Jerome Hicks, male    DOB: September 25, 1951, 62 y.o.   MRN: [id number]  HPI See anticoagulation flowsheet for adjustments to his Coumadin today.   Review of Systems     Objective:   Physical Exam        Assessment & Plan:

## 2013-11-02 NOTE — Progress Notes (Signed)
Patient was in the office for coumadin check. INR was 2.1. There were no negative findings. Jerome Hicks,CMA

## 2013-11-02 NOTE — Progress Notes (Signed)
Spoke to patient gave him coumadin dose changes. Rhonda Cunningham,CMA

## 2013-11-16 ENCOUNTER — Ambulatory Visit: Payer: Medicare HMO

## 2013-11-30 ENCOUNTER — Encounter: Payer: Self-pay | Admitting: Physician Assistant

## 2013-11-30 ENCOUNTER — Ambulatory Visit: Payer: Medicare HMO | Admitting: Family Medicine

## 2013-11-30 ENCOUNTER — Ambulatory Visit (INDEPENDENT_AMBULATORY_CARE_PROVIDER_SITE_OTHER): Payer: Medicare HMO | Admitting: Physician Assistant

## 2013-11-30 VITALS — BP 101/74 | HR 93 | Wt 275.0 lb

## 2013-11-30 DIAGNOSIS — R609 Edema, unspecified: Secondary | ICD-10-CM

## 2013-11-30 DIAGNOSIS — R600 Localized edema: Secondary | ICD-10-CM

## 2013-11-30 DIAGNOSIS — N179 Acute kidney failure, unspecified: Secondary | ICD-10-CM

## 2013-11-30 DIAGNOSIS — A419 Sepsis, unspecified organism: Secondary | ICD-10-CM

## 2013-11-30 DIAGNOSIS — R599 Enlarged lymph nodes, unspecified: Secondary | ICD-10-CM

## 2013-11-30 DIAGNOSIS — I959 Hypotension, unspecified: Secondary | ICD-10-CM

## 2013-11-30 DIAGNOSIS — I4891 Unspecified atrial fibrillation: Secondary | ICD-10-CM

## 2013-11-30 LAB — CBC WITH DIFFERENTIAL/PLATELET
Basophils Absolute: 0.1 10*3/uL (ref 0.0–0.1)
Eosinophils Relative: 2 % (ref 0–5)
Lymphocytes Relative: 15 % (ref 12–46)
MCV: 89.3 fL (ref 78.0–100.0)
Neutro Abs: 5.9 10*3/uL (ref 1.7–7.7)
Neutrophils Relative %: 71 % (ref 43–77)
Platelets: 425 10*3/uL — ABNORMAL HIGH (ref 150–400)
RDW: 13.2 % (ref 11.5–15.5)
WBC: 8.2 10*3/uL (ref 4.0–10.5)

## 2013-11-30 NOTE — Progress Notes (Signed)
Subjective:    Patient ID: Jerome Hicks, male    DOB: December 11, 1950, 62 y.o.   MRN: [id number]  HPI Patient is a 62 year old male who presents to the clinic to followup after hospital discharge on 11/27/2013. Patient presented to hospital with bilateral lower extremity edema. He was diagnosed with severe sepsis, hypotension, acute renal failure, cellulitis, ongoing A. Fib. Patient was treated with IV antibiotics as well as diuretics to decrease fluid and medication changes. While in hospital he was evaluated by cardiologist and ruled out congestive heart failure or any issues with heart. Last blood pressure was 118/67 with a pulse of 76 and a pulse ox of 95%. he was told to stop tramadol and Norvasc. Patient does have multiple followup appointments with cardiology, infectious disease, lymphedema clinic and vascular surgery. Medications were changed in system to be up today with changes completed in hospital. Patient does feel like his legs are decreasing with fluid daily. He continues to go to the bathroom about every 15-30 minutes being on Lasix. He continues to feel a little dizzy but is much improved from hospital visit. He denies any chest pains. He has significant bilateral leg pain with right leg being worse than left. He continues to put cream on the legs and try to keep wrapped. He has a nurse that comes by and manages his wounds couple times a week. He is due to see when clinic where they will discuss other options to feel well. He continues to receive IV Rocephin antibiotics. Nurse comes in today to capture more cultures.   Review of Systems     Objective:   Physical Exam  Constitutional: He is oriented to person, place, and time.  Pt seemed lethargic with no energy.   HENT:  Head: Normocephalic and atraumatic.  Cardiovascular: Normal rate, regular rhythm and normal heart sounds.   Pulmonary/Chest: Effort normal and breath sounds normal. No respiratory distress. He has no wheezes. He  has no rales. He exhibits no tenderness.  Neurological: He is alert and oriented to person, place, and time.  Skin: There is pallor.  Face pale.   Psychiatric: He has a normal mood and affect.          Assessment & Plan:  Severe sepsis/hypotension/acute renal failure- I will continue to decrease blood pressure medicine. The kidneys to 3.125 mg twice a day continue on Cozaar 25 mg daily Will check CMP and CBC today. Pr is being followed by infectious disease for sepsis and blood cultures are being obtained. Follow up in 1 week with Dr. Linford Arnold.  Bilateral lower extremity edema- reassured pt that since cardiologist worked up heart and found no problems. reassuring that when we get infection and swelling down you have a good prognosis. Continue to use clotrimazole daily. Will let wound clinic manage treatment of skin. Seems like lasix daily is making good progress. Weight is 8 lbs down from discharge. Do not want to make any changes today since BP is so low. Don't want to increase diruretic and decrease blood pressure. Discussed salt restriction to 1.5g daily and no more than 42 oz of fluid a day. Weigh daily and call with any change of weight greater than 3lbs of gain in one day. Keep feet elevated when seated but walk for 10 minutes every hour with walker.Follow up in 1 week with Dr. Linford Arnold.Discussed with Dr. Linford Arnold since pt is in intense pain can start back on tramadol every 8 hours as needed for pain.   Chronic A.  Fib-in hospital patient was decreased to 2.5 mg daily Coumadin. INR today was 2.2. No adjustment needed but would like to recheck in 1 week.   Inguinal lymphnode enlargement- did not palpate today. Will get repeat CT for evaluation in 4 week per recommendation.

## 2013-11-30 NOTE — Patient Instructions (Addendum)
Carvedilol(coreg) 1/2 tablet of 6.25 twice a day. Follow up in one week.   Continue on lasix 40mg  daily. Continue a low salt diet no more than 1500mg . Keep fluid intake to 42oz daily.   Keep wound care and infectious disease appt.   CT scan end of January for inguinal lymphnodes.

## 2013-12-01 DIAGNOSIS — A401 Sepsis due to streptococcus, group B: Secondary | ICD-10-CM | POA: Insufficient documentation

## 2013-12-01 DIAGNOSIS — I872 Venous insufficiency (chronic) (peripheral): Secondary | ICD-10-CM | POA: Insufficient documentation

## 2013-12-01 HISTORY — DX: Sepsis due to Streptococcus, group B: A40.1

## 2013-12-01 LAB — COMPLETE METABOLIC PANEL WITH GFR
ALT: 44 U/L (ref 0–53)
AST: 33 U/L (ref 0–37)
Albumin: 2.7 g/dL — ABNORMAL LOW (ref 3.5–5.2)
Calcium: 8.2 mg/dL — ABNORMAL LOW (ref 8.4–10.5)
Chloride: 101 mEq/L (ref 96–112)
Creat: 0.86 mg/dL (ref 0.50–1.35)
Glucose, Bld: 153 mg/dL — ABNORMAL HIGH (ref 70–99)
Potassium: 4.3 mEq/L (ref 3.5–5.3)
Sodium: 135 mEq/L (ref 135–145)
Total Bilirubin: 1.4 mg/dL — ABNORMAL HIGH (ref 0.3–1.2)
Total Protein: 7.1 g/dL (ref 6.0–8.3)

## 2013-12-02 DIAGNOSIS — I959 Hypotension, unspecified: Secondary | ICD-10-CM | POA: Insufficient documentation

## 2013-12-02 DIAGNOSIS — R600 Localized edema: Secondary | ICD-10-CM | POA: Insufficient documentation

## 2013-12-02 DIAGNOSIS — A419 Sepsis, unspecified organism: Secondary | ICD-10-CM | POA: Insufficient documentation

## 2013-12-02 DIAGNOSIS — R599 Enlarged lymph nodes, unspecified: Secondary | ICD-10-CM | POA: Insufficient documentation

## 2013-12-02 DIAGNOSIS — N179 Acute kidney failure, unspecified: Secondary | ICD-10-CM | POA: Insufficient documentation

## 2013-12-02 MED ORDER — CARVEDILOL 3.125 MG PO TABS: 3.1250 mg | ORAL_TABLET | Freq: Two times a day (BID) | ORAL | Status: DC

## 2013-12-07 ENCOUNTER — Ambulatory Visit: Payer: Medicare HMO | Admitting: Family Medicine

## 2013-12-10 ENCOUNTER — Ambulatory Visit: Payer: Medicare HMO | Admitting: Family Medicine

## 2013-12-10 ENCOUNTER — Ambulatory Visit: Payer: Self-pay | Admitting: Family Medicine

## 2013-12-10 ENCOUNTER — Ambulatory Visit (INDEPENDENT_AMBULATORY_CARE_PROVIDER_SITE_OTHER): Payer: Medicare HMO | Admitting: Family Medicine

## 2013-12-10 ENCOUNTER — Encounter: Payer: Self-pay | Admitting: Family Medicine

## 2013-12-10 ENCOUNTER — Other Ambulatory Visit: Payer: Self-pay | Admitting: *Deleted

## 2013-12-10 VITALS — BP 99/66 | HR 94 | Temp 97.7°F | Wt 272.0 lb

## 2013-12-10 DIAGNOSIS — N183 Chronic kidney disease, stage 3 unspecified: Secondary | ICD-10-CM

## 2013-12-10 DIAGNOSIS — I10 Essential (primary) hypertension: Secondary | ICD-10-CM

## 2013-12-10 DIAGNOSIS — R600 Localized edema: Secondary | ICD-10-CM

## 2013-12-10 DIAGNOSIS — J984 Other disorders of lung: Secondary | ICD-10-CM

## 2013-12-10 DIAGNOSIS — I959 Hypotension, unspecified: Secondary | ICD-10-CM

## 2013-12-10 DIAGNOSIS — N179 Acute kidney failure, unspecified: Secondary | ICD-10-CM

## 2013-12-10 DIAGNOSIS — R609 Edema, unspecified: Secondary | ICD-10-CM

## 2013-12-10 DIAGNOSIS — I4891 Unspecified atrial fibrillation: Secondary | ICD-10-CM

## 2013-12-10 LAB — POCT INR: INR: 2.4

## 2013-12-10 MED ORDER — FUROSEMIDE 40 MG PO TABS: 80.0000 mg | ORAL_TABLET | ORAL | Status: DC | PRN

## 2013-12-10 MED ORDER — TRAMADOL HCL 50 MG PO TABS: 50.0000 mg | ORAL_TABLET | Freq: Three times a day (TID) | ORAL | Status: DC | PRN

## 2013-12-10 MED ORDER — LOSARTAN POTASSIUM 25 MG PO TABS: 25.0000 mg | ORAL_TABLET | Freq: Every day | ORAL | Status: DC

## 2013-12-10 MED ORDER — DOXAZOSIN MESYLATE 4 MG PO TABS: 4.0000 mg | ORAL_TABLET | Freq: Every day | ORAL | Status: DC

## 2013-12-10 MED ORDER — POTASSIUM CHLORIDE ER 10 MEQ PO TBCR: 10.0000 meq | EXTENDED_RELEASE_TABLET | ORAL | Status: DC | PRN

## 2013-12-10 MED ORDER — AMBULATORY NON FORMULARY MEDICATION: Status: DC

## 2013-12-10 MED ORDER — MECLIZINE HCL 25 MG PO TABS
ORAL_TABLET | ORAL | Status: DC
Start: 2013-12-10 — End: 2014-07-06

## 2013-12-10 NOTE — Patient Instructions (Signed)
Stopped the losartan for now since her blood pressure is low.

## 2013-12-10 NOTE — Progress Notes (Signed)
   Subjective:    Patient ID: Jerome Hicks, male    DOB: 1951-07-03, 63 y.o.   MRN: [id number]  HPI Lower leg edema- improved overall. Going to wound center. Has UNNA boot on now. Had them changed yesterday.   Acute renal failure - renal function went up during hospitalization. But repeat 1 week ago had gone back to baseline.   HTN- BP was low so Coreg was reduced to 3.125mg .  Has been dizzy, coming and going.  Normal appetite.  Has had a dry ticking cough. No phelgm.  No fever, chills, or sweats.    A fib- see coumadin flow sheet.   Status post sepsis secondary to wound infection the leg. He is going to the wound care center and just completed his oral antibiotics. He was discharged home with oxygen. He says he quit wearing it about 3 days ago because he felt like he didn't need it anymore.  Has a vascular appt on 1/19th for evaluation for his legs for PVD?  Review of Systems     Objective:   Physical Exam  Constitutional: He is oriented to person, place, and time. He appears well-developed and well-nourished.  HENT:  Head: Normocephalic and atraumatic.  Cardiovascular: Normal rate, regular rhythm and normal heart sounds.   Pulmonary/Chest: Effort normal and breath sounds normal.  Musculoskeletal: He exhibits edema.  Neurological: He is alert and oriented to person, place, and time.  Skin: Skin is warm and dry.  Psychiatric: He has a normal mood and affect. His behavior is normal.          Assessment & Plan:  Lower leg edema-weight is down 5 pounds over the last 5 days with current Lasix regimen.  Recheck kidneys and potassium in 2 weeks.   Acute renal failure-last creatinine was back to baseline. Recheck in 1 mo.   Hypertension-blood pressure still low today. Hold losartan. F/U in 2 weeks.   Atrial fibrillation-see anticoagulation flowsheet.  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 He quit wearing it 3 days ago. We will to walk test today to see if we  can discontinue the home oxygen or not. At rest he is 94%. Ambulating maintains same level. Will call [facility] and discontinue his oxygen.

## 2013-12-14 ENCOUNTER — Telehealth: Payer: Self-pay | Admitting: Family Medicine

## 2013-12-14 NOTE — Telephone Encounter (Signed)
Patient called and states he thought Dr.Metheney had already faxed a D/C oxygen order over to [facility] but they still have not picked up the oxygen and he needs this done TODAY because he is getting charged DAILY for this. Their fax number is [phone]. Please make sure they pick up today. Thanks

## 2013-12-14 NOTE — Telephone Encounter (Signed)
Pt informed that this was done last week. I will resend.Jerome Hicks

## 2013-12-14 NOTE — Telephone Encounter (Signed)
Jerome Hicks please call to make sure they have received it. It has been faxed 3 seprated times. I can't believe they haven't received at least one out of the three.

## 2013-12-15 ENCOUNTER — Telehealth: Payer: Self-pay | Admitting: *Deleted

## 2013-12-15 MED ORDER — AMBULATORY NON FORMULARY MEDICATION: Status: DC

## 2013-12-15 NOTE — Telephone Encounter (Signed)
Let call pt and see if he can find a label or sticker on his tank.

## 2013-12-15 NOTE — Telephone Encounter (Signed)
Simona Huh with [facility] called and wanted to let you know that he received a D/C order for O2 for this patient and they do not supply this patient with his O2.  Need to send D/C order to who supplies it

## 2013-12-15 NOTE — Telephone Encounter (Signed)
Nope already done and he says he can't find one and that he thought it was [facility].  I asked [facility] if they may know who supplied it and they do not know

## 2013-12-15 NOTE — Telephone Encounter (Signed)
Home health was originally who ordered his supplies, while he was in the hospital at [facility]. We may need to call them to find out who they ordered the supplies from.

## 2013-12-15 NOTE — Telephone Encounter (Signed)
Called and pt found number on tank [phone]- this is [facility] so I faxed an order to D/C O2 to their office @ [phone]. Clemetine Marker, LPN

## 2013-12-24 ENCOUNTER — Encounter: Payer: Self-pay | Admitting: Family Medicine

## 2013-12-24 ENCOUNTER — Ambulatory Visit (INDEPENDENT_AMBULATORY_CARE_PROVIDER_SITE_OTHER): Payer: Medicare HMO | Admitting: Family Medicine

## 2013-12-24 ENCOUNTER — Ambulatory Visit: Payer: Self-pay | Admitting: Family Medicine

## 2013-12-24 ENCOUNTER — Telehealth: Payer: Self-pay | Admitting: Family Medicine

## 2013-12-24 VITALS — BP 120/68 | HR 100 | Temp 97.9°F | Wt 270.0 lb

## 2013-12-24 DIAGNOSIS — J984 Other disorders of lung: Secondary | ICD-10-CM

## 2013-12-24 DIAGNOSIS — I4891 Unspecified atrial fibrillation: Secondary | ICD-10-CM

## 2013-12-24 DIAGNOSIS — R3911 Hesitancy of micturition: Secondary | ICD-10-CM

## 2013-12-24 DIAGNOSIS — I10 Essential (primary) hypertension: Secondary | ICD-10-CM

## 2013-12-24 DIAGNOSIS — R609 Edema, unspecified: Secondary | ICD-10-CM

## 2013-12-24 LAB — POCT URINALYSIS DIPSTICK
Blood, UA: NEGATIVE
Glucose, UA: NEGATIVE
Ketones, UA: 15
Leukocytes, UA: NEGATIVE
Nitrite, UA: POSITIVE
PROTEIN UA: 100
Urobilinogen, UA: 1
pH, UA: 5.5

## 2013-12-24 LAB — POCT INR: INR: 1.9

## 2013-12-24 NOTE — Telephone Encounter (Signed)
Call pt: they did send culture on pustule at the wound center. WE did get notes. Report for culture won't be available until Monday or Tuesday. Call [facility]) on Monday to see if they have final report back or not.

## 2013-12-24 NOTE — Progress Notes (Signed)
Subjective:    Patient ID: Jerome Hicks, male    DOB: February 19, 1951, 63 y.o.   MRN: [id number]  HPI Dependent edema-he continues to take his Lasix. On January 9 his weight was 272. He brought in his weights from home he has been weighing himself daily. Today he is down to 268. That is a drop of 4 pounds. He's also been following his blood pressures daily. These have been primarily running 712 systolic of 458.  Diastolic has been running in the 60s to 70s. And he did have 2 low blood pressures with a systolic of 98. He has been holding his losartan.    Restrictive lung disease-the last time he was here we did a walk test to see if he started his oxygen here. He no longer needed it. We have tried multiple times to contact his home health agency to discontinue his oxygen.  Atrial fibrillation-no chest pain or shortness of breath. He is taking his Coumadin as prescribed. He is taking a whole tab 2 days a week and half a tab the rest of the days the week. He has not had any easy bruising or bleeding.  HTN-still holding his losartan.    Has noticed hard time emptying his bladder x 1 week. Says not sure if related to her blader. Says will start to urinate but then will stop and start. Does take a diuretic and this increases his urination for 3-4 hours.  He is taking his cardura. Doesn't wake up to urinate at night.   Venous [facility] Post has been going to wound care clinic and is making some great strides. He still has intermittent on today. May also discussed putting her on antibiotics. He says it wasn't ever know today so that we can decide if we are going to do that or not. Review of Systems     Objective:   Physical Exam  Constitutional: He is oriented to person, place, and time. He appears well-developed and well-nourished.  HENT:  Head: Normocephalic and atraumatic.  Cardiovascular: Normal rate, regular rhythm and normal heart sounds.   Pulmonary/Chest: Effort normal and breath sounds normal.   Neurological: He is alert and oriented to person, place, and time.  Skin: Skin is warm and dry.  Psychiatric: He has a normal mood and affect. His behavior is normal.       Assessment & Plan:  Dependent edema-improving slightly. Its place he also works on losing weight will help as well. Typically when he is lighter he has less problems with the edema and swelling. Will continue Lasix once a day. Updated chart. This seems to be controlling his weight is actually down 4 pounds from when I saw him 2 weeks ago. Continue work on low-salt diet and regular exercise. He's been walking. He wants to know when he can go back to the gym. I wanted at the notes from the wound care clinic first. They have discussed potentially putting him on a round of antibiotics I want to take a look at that before at home if okay to go back to the gym. It is okay for him to start driving today.  Atrial fibrillation-see intake regulation flowsheet for adjustments to his Coumadin. His INR is subtherapeutic today.  Rstrictive Lung disease-off oxygen and doing well. He denies any shortness of breath.  Urinary hesitancy-will check a urinalysis today as well as a BUN and creatinine. Suspect this could be from his prostate. We'll also check a PSA at the last one was  done about a year and half ago. Symptoms started 2 weeks ago.

## 2013-12-25 LAB — PSA: PSA: 1.34 ng/mL (ref <=4.00)

## 2013-12-25 LAB — BASIC METABOLIC PANEL WITH GFR
BUN: 13 mg/dL (ref 6–23)
CO2: 30 mEq/L (ref 19–32)
Calcium: 8.9 mg/dL (ref 8.4–10.5)
Chloride: 102 mEq/L (ref 96–112)
Creat: 1.02 mg/dL (ref 0.50–1.35)
GFR, Est African American: >89 mL/min
GFR, Est Non African American: 78 mL/min
Glucose, Bld: 148 mg/dL — ABNORMAL HIGH (ref 70–99)
Potassium: 4.2 mEq/L (ref 3.5–5.3)
Sodium: 139 mEq/L (ref 135–145)

## 2013-12-25 NOTE — Telephone Encounter (Signed)
Called pt called and informed him that results are not back at this time. Told him that I would try back on Monday.Jerome Hicks [place]

## 2013-12-26 LAB — URINE CULTURE
Colony Count: NO GROWTH
Organism ID, Bacteria: NO GROWTH

## 2013-12-27 NOTE — Progress Notes (Signed)
Quick Note:  All labs are normal. ______ 

## 2013-12-28 NOTE — Telephone Encounter (Signed)
Sounds good. Let check INR on Wednesay so we can make adjustment before the weekend if needed.

## 2013-12-28 NOTE — Telephone Encounter (Signed)
Pt called and stated that he was given an Abx for MRSA through wound care and was told to have his INR monitored closely he wanted to know what you thought he should do. Please advise.Audelia Hives [place]

## 2013-12-28 NOTE — Telephone Encounter (Signed)
Called and informed pt to come in to have INR checked on wed. Pt wanted to know the affect of the abx on the INR. He was unsure of the name he will bring this in on wed.Audelia Hives [place]

## 2013-12-30 ENCOUNTER — Ambulatory Visit (INDEPENDENT_AMBULATORY_CARE_PROVIDER_SITE_OTHER): Payer: Medicare HMO | Admitting: Family Medicine

## 2013-12-30 VITALS — BP 132/85 | HR 84 | Ht 75.0 in | Wt 280.0 lb

## 2013-12-30 DIAGNOSIS — I4891 Unspecified atrial fibrillation: Secondary | ICD-10-CM

## 2013-12-30 LAB — POCT INR: INR: 2.3

## 2013-12-30 NOTE — Progress Notes (Signed)
Patient was in office for coumadin check INR was 2.3 patient denied any bleeding, or bruising. Rhonda Cunningham,CMA

## 2014-01-01 ENCOUNTER — Telehealth: Payer: Self-pay | Admitting: Physician Assistant

## 2014-01-01 DIAGNOSIS — R590 Localized enlarged lymph nodes: Secondary | ICD-10-CM

## 2014-01-01 NOTE — Telephone Encounter (Signed)
Call pt: have reminder that need follow up CT of pelvis to evaluate inguinal lymphnode enlarge. I placed order today

## 2014-01-01 NOTE — Telephone Encounter (Signed)
Message copied by Donella Stade on Fri Jan 01, 2014  3:47 PM ------      Message from: Jerome Hicks      Created: Wed Dec 02, 2013 12:14 PM       CT of abdomin and pelvis for inguinal lymphnode enlargement. ------

## 2014-01-04 NOTE — Telephone Encounter (Signed)
Pt.notified

## 2014-01-06 ENCOUNTER — Telehealth: Payer: Self-pay | Admitting: *Deleted

## 2014-01-06 NOTE — Telephone Encounter (Signed)
PA obtained for CT ABD/Pelvis w/o contrast. Auth # [id number] per Jackelyn Poling at [facility].  Oscar La, LPN

## 2014-01-07 ENCOUNTER — Ambulatory Visit: Payer: Medicare HMO

## 2014-01-08 ENCOUNTER — Telehealth: Payer: Self-pay | Admitting: *Deleted

## 2014-01-08 MED ORDER — DOXAZOSIN MESYLATE 4 MG PO TABS: 4.0000 mg | ORAL_TABLET | Freq: Every day | ORAL | Status: DC

## 2014-01-08 MED ORDER — CARVEDILOL 3.125 MG PO TABS: 3.1250 mg | ORAL_TABLET | Freq: Two times a day (BID) | ORAL | Status: DC

## 2014-01-08 MED ORDER — WARFARIN SODIUM 5 MG PO TABS: 2.5000 mg | ORAL_TABLET | Freq: Every day | ORAL | Status: DC

## 2014-01-08 NOTE — Telephone Encounter (Signed)
Increase dose. Take whole tab on Saturday and Monday and Wednesday. Half tab all other days. Recheck in 2 week.

## 2014-01-14 ENCOUNTER — Ambulatory Visit: Payer: Medicare HMO | Admitting: Family Medicine

## 2014-01-18 ENCOUNTER — Ambulatory Visit (INDEPENDENT_AMBULATORY_CARE_PROVIDER_SITE_OTHER): Payer: Medicare HMO | Admitting: Physician Assistant

## 2014-01-18 ENCOUNTER — Ambulatory Visit: Payer: Medicare HMO | Admitting: Family Medicine

## 2014-01-18 ENCOUNTER — Encounter: Payer: Self-pay | Admitting: *Deleted

## 2014-01-18 VITALS — BP 129/79 | HR 77 | Wt 287.0 lb

## 2014-01-18 DIAGNOSIS — I4891 Unspecified atrial fibrillation: Secondary | ICD-10-CM

## 2014-01-18 LAB — POCT INR: INR: 2.5

## 2014-01-18 NOTE — Progress Notes (Signed)
Pt here for INR check no missed doses, diet changes,bruising,bleeding,CP,SOB . 

## 2014-02-04 ENCOUNTER — Ambulatory Visit: Payer: Self-pay | Admitting: Family Medicine

## 2014-02-04 ENCOUNTER — Ambulatory Visit (INDEPENDENT_AMBULATORY_CARE_PROVIDER_SITE_OTHER): Payer: Medicare HMO | Admitting: Family Medicine

## 2014-02-04 ENCOUNTER — Encounter: Payer: Self-pay | Admitting: Family Medicine

## 2014-02-04 VITALS — BP 126/78 | HR 84 | Wt 283.0 lb

## 2014-02-04 DIAGNOSIS — I4891 Unspecified atrial fibrillation: Secondary | ICD-10-CM

## 2014-02-04 DIAGNOSIS — I10 Essential (primary) hypertension: Secondary | ICD-10-CM

## 2014-02-04 DIAGNOSIS — R200 Anesthesia of skin: Secondary | ICD-10-CM

## 2014-02-04 DIAGNOSIS — R609 Edema, unspecified: Secondary | ICD-10-CM

## 2014-02-04 DIAGNOSIS — N529 Male erectile dysfunction, unspecified: Secondary | ICD-10-CM

## 2014-02-04 DIAGNOSIS — R209 Unspecified disturbances of skin sensation: Secondary | ICD-10-CM

## 2014-02-04 LAB — POCT INR: INR: 2.4

## 2014-02-04 NOTE — Patient Instructions (Signed)
We could consider changing you from coumadin to Pradaxa for your atrial fibrillation

## 2014-02-04 NOTE — Progress Notes (Signed)
INR check performed during OV no missed doses, diet changes,bruising,bleeding,CP,SOB.Audelia Hives [place]

## 2014-02-04 NOTE — Progress Notes (Signed)
   Subjective:    Patient ID: Jerome Hicks, male    DOB: Aug 24, 1951, 63 y.o.   MRN: [id number]  HPI HTN-  Pt denies chest pain, SOB, dizziness, or heart palpitations.  Taking meds as directed w/o problems.  Denies medication side effects.  Swelling is under great control. He is wearing his compression stockings today.  A-fib - no excessive bleeding or bruising. Has been taking medications consistently..    5th digit numbness bilat that is constant. Started while he was in the hospital getting IV antibiotic therapy for cellulitis of his legs. He says he woke up with it one morning. The doctor there thought maybe he had slept on his neck wrong. She denies any trauma or injury. He wonders if this could be a medication side effect.  No neck ro elbow pain. He would like to see a Administrator. No weakness with grip strength. . Joints ache with cold weather.   Review of Systems     Objective:   Physical Exam  Constitutional: He is oriented to person, place, and time. He appears well-developed and well-nourished.  HENT:  Head: Normocephalic and atraumatic.  Neck: Neck supple. No thyromegaly present.  Cardiovascular: Normal rate, regular rhythm and normal heart sounds.   Pulmonary/Chest: Effort normal and breath sounds normal.  Musculoskeletal: He exhibits edema.  Trace edema of the ankles and feet bilaterally.  Lymphadenopathy:    He has no cervical adenopathy.  Neurological: He is alert and oriented to person, place, and time.  Skin: Skin is warm and dry.  Psychiatric: He has a normal mood and affect. His behavior is normal.          Assessment & Plan:  HTN - Well controlled.  Continue current regimen. F/U in 6 months. Lower extremity edema is well-controlled today.  A-fib. See anticoagulation flowsheet.    ED - He would like to have his testosterone checked.  Would like samples today.   Finger numbness - would like ot see a chiropracter first before additional workup. Since it is  bilateral and is affecting both fifth digits suspect this could be coming from his neck. He's not having any shoulder or elbow pain or neck pain. If the chiropractor is not helpful then consider starting with x-rays of the cervical spine.

## 2014-02-05 ENCOUNTER — Other Ambulatory Visit: Payer: Self-pay | Admitting: *Deleted

## 2014-02-05 DIAGNOSIS — F528 Other sexual dysfunction not due to a substance or known physiological condition: Secondary | ICD-10-CM

## 2014-02-05 LAB — TESTOSTERONE, FREE, TOTAL, SHBG
SEX HORMONE BINDING: 41 nmol/L (ref 13–71)
Testosterone, Free: 49.8 pg/mL (ref 47.0–244.0)
Testosterone-% Free: 1.7 % (ref 1.6–2.9)
Testosterone: 291 ng/dL — ABNORMAL LOW (ref 300–890)

## 2014-02-08 ENCOUNTER — Ambulatory Visit: Payer: Medicare HMO | Admitting: Family Medicine

## 2014-02-15 ENCOUNTER — Telehealth: Payer: Self-pay | Admitting: *Deleted

## 2014-02-15 DIAGNOSIS — M9981 Other biomechanical lesions of cervical region: Secondary | ICD-10-CM

## 2014-02-15 DIAGNOSIS — M5412 Radiculopathy, cervical region: Secondary | ICD-10-CM

## 2014-02-15 DIAGNOSIS — IMO0001 Reserved for inherently not codable concepts without codable children: Secondary | ICD-10-CM

## 2014-02-15 NOTE — Telephone Encounter (Signed)
Called from [facility] would like to get x-rays done here due to cost/insurace. They will fax over the [facility] notes for this .Audelia Hives [place]

## 2014-02-17 NOTE — Telephone Encounter (Signed)
Please copy chiropractic and see if they will fax that over. The patient called about 2 days ago and I have kept looking for the report but have not seen at come across my desk yet.... Please Call and make sure they have the correct fax number.

## 2014-02-22 NOTE — Addendum Note (Signed)
Addended by: [name] on: 02/22/2014 11:53 AM   Modules accepted: Orders

## 2014-02-25 ENCOUNTER — Ambulatory Visit (INDEPENDENT_AMBULATORY_CARE_PROVIDER_SITE_OTHER): Payer: Medicare HMO

## 2014-02-25 ENCOUNTER — Ambulatory Visit (INDEPENDENT_AMBULATORY_CARE_PROVIDER_SITE_OTHER): Payer: Medicare HMO | Admitting: Family Medicine

## 2014-02-25 ENCOUNTER — Encounter: Payer: Self-pay | Admitting: *Deleted

## 2014-02-25 VITALS — BP 139/85 | HR 81

## 2014-02-25 DIAGNOSIS — F528 Other sexual dysfunction not due to a substance or known physiological condition: Secondary | ICD-10-CM

## 2014-02-25 DIAGNOSIS — IMO0001 Reserved for inherently not codable concepts without codable children: Secondary | ICD-10-CM

## 2014-02-25 DIAGNOSIS — I4891 Unspecified atrial fibrillation: Secondary | ICD-10-CM

## 2014-02-25 DIAGNOSIS — M5412 Radiculopathy, cervical region: Secondary | ICD-10-CM

## 2014-02-25 DIAGNOSIS — M9981 Other biomechanical lesions of cervical region: Secondary | ICD-10-CM

## 2014-02-25 DIAGNOSIS — M47812 Spondylosis without myelopathy or radiculopathy, cervical region: Secondary | ICD-10-CM

## 2014-02-25 LAB — POCT INR: INR: 1.7

## 2014-02-25 NOTE — Progress Notes (Signed)
   Subjective:    Patient ID: Jerome Hicks, male    DOB: August 17, 1951, 63 y.o.   MRN: [id number] Pt notified of new dosing instructions & was transferred to scheduling for a 1 week recheck. Beatris Ship, CMA HPI    Review of Systems     Objective:   Physical Exam        Assessment & Plan:

## 2014-02-26 ENCOUNTER — Encounter: Payer: Self-pay | Admitting: Family Medicine

## 2014-02-26 DIAGNOSIS — E291 Testicular hypofunction: Secondary | ICD-10-CM | POA: Insufficient documentation

## 2014-02-26 LAB — TESTOSTERONE: TESTOSTERONE: 284 ng/dL — AB (ref 300–890)

## 2014-03-01 ENCOUNTER — Ambulatory Visit (INDEPENDENT_AMBULATORY_CARE_PROVIDER_SITE_OTHER): Payer: Medicare HMO

## 2014-03-01 ENCOUNTER — Other Ambulatory Visit: Payer: Self-pay | Admitting: *Deleted

## 2014-03-01 DIAGNOSIS — R209 Unspecified disturbances of skin sensation: Secondary | ICD-10-CM

## 2014-03-01 DIAGNOSIS — M9981 Other biomechanical lesions of cervical region: Secondary | ICD-10-CM

## 2014-03-01 DIAGNOSIS — IMO0001 Reserved for inherently not codable concepts without codable children: Secondary | ICD-10-CM

## 2014-03-01 DIAGNOSIS — M5412 Radiculopathy, cervical region: Secondary | ICD-10-CM

## 2014-03-04 ENCOUNTER — Ambulatory Visit (INDEPENDENT_AMBULATORY_CARE_PROVIDER_SITE_OTHER): Payer: Medicare HMO | Admitting: Family Medicine

## 2014-03-04 ENCOUNTER — Encounter: Payer: Self-pay | Admitting: *Deleted

## 2014-03-04 ENCOUNTER — Telehealth: Payer: Self-pay | Admitting: *Deleted

## 2014-03-04 VITALS — BP 141/78 | HR 78

## 2014-03-04 DIAGNOSIS — I4891 Unspecified atrial fibrillation: Secondary | ICD-10-CM

## 2014-03-04 LAB — POCT INR: INR: 2.6

## 2014-03-04 NOTE — Telephone Encounter (Signed)
Please tell him that he will have to schedule an office visit so that we can discuss the risks and benefits of hormone replacement therapy. This has been documented before we can start it for patient safety but we could certainly consider the AndroGel which is what I think he is most interested in.

## 2014-03-04 NOTE — Telephone Encounter (Signed)
Pt notified & was transferred to scheduling. 

## 2014-03-04 NOTE — Telephone Encounter (Signed)
Pt was in for an INR check this am & stated that we were suppose to send over testosterone for him.  I don't see it in his chart, even in historical meds.

## 2014-03-04 NOTE — Progress Notes (Signed)
   Subjective:    Patient ID: Jerome Hicks, male    DOB: 1951-07-31, 63 y.o.   MRN: [id number] Pt notified of dosing instructions. Beatris Ship, CMA HPI    Review of Systems     Objective:   Physical Exam        Assessment & Plan:

## 2014-03-18 ENCOUNTER — Encounter: Payer: Self-pay | Admitting: Family Medicine

## 2014-03-18 ENCOUNTER — Ambulatory Visit: Payer: Self-pay | Admitting: Family Medicine

## 2014-03-18 ENCOUNTER — Ambulatory Visit (INDEPENDENT_AMBULATORY_CARE_PROVIDER_SITE_OTHER): Payer: Medicare HMO | Admitting: Family Medicine

## 2014-03-18 VITALS — BP 131/74 | HR 88 | Wt 290.0 lb

## 2014-03-18 DIAGNOSIS — I4891 Unspecified atrial fibrillation: Secondary | ICD-10-CM

## 2014-03-18 DIAGNOSIS — E291 Testicular hypofunction: Secondary | ICD-10-CM

## 2014-03-18 LAB — POCT INR: INR: 2.8

## 2014-03-18 MED ORDER — TESTOSTERONE 40.5 MG/2.5GM (1.62%) TD GEL: 2.0000 | Freq: Every morning | TRANSDERMAL | Status: DC

## 2014-03-18 NOTE — Progress Notes (Signed)
   Subjective:    Patient ID: Jerome Hicks, male    DOB: [birth year], 63 y.o.   MRN: [id number]  HPI Here to f/u on low testosterone. Had 2 consistant levles that were low. He is interested in testosterone replacement therapy. Please see previous note.  Afib - no bleeding or easy bruising. He did have his Coumadin check. Discussed that his dose last time.   Review of Systems     Objective:   Physical Exam  Constitutional: He is oriented to person, place, and time. He appears well-developed and well-nourished.  HENT:  Head: Normocephalic and atraumatic.  Cardiovascular: Normal rate, regular rhythm and normal heart sounds.   Pulmonary/Chest: Effort normal and breath sounds normal.  Neurological: He is alert and oriented to person, place, and time.  Skin: Skin is warm and dry.  Psychiatric: He has a normal mood and affect. His behavior is normal.          Assessment & Plan:  Hypogonadism - we discussed different treatment options. Also discussed the risks and benefits of the medication. This includes increased risk of heart disease et Ronney Asters. It is not showing clear clinical benefit on a medication that should be discontinued. I showed him the different demise for AndroGel, Fortesta, Axiron. AndroGel looks like it's better covered on his insurance plan. We'll send him prescription. He'll see me back in a month. We will followup on his levels and adjust the dose as needed. Also warned about the need to follow the PSA on this medication and potential to make a prostate cancer growth. He understands the risks and would like to start hormone replacement therapy. Also reviewed it with him that typically testosterone replacement does not improve erectile dysfunction.  Afib- See anticoag flowsheet for adjustments to Coumadin.  Time spent 20 minutes, graded as of the time spent counseling about hypogonadism and treatment options.

## 2014-03-18 NOTE — Progress Notes (Signed)
Pt here for INR check no missed doses, diet changes,bruising,bleeding,CP,SOB.Jerome Hicks

## 2014-03-24 ENCOUNTER — Telehealth: Payer: Self-pay | Admitting: *Deleted

## 2014-03-24 DIAGNOSIS — R200 Anesthesia of skin: Secondary | ICD-10-CM

## 2014-03-24 NOTE — Telephone Encounter (Signed)
Pt stated that he could not afford the testosterone and pt stated that he called his insurance company about the anticoagulation med and thinks that they will pay for the one that begins with a "P"? Please advise.Jerome Hicks

## 2014-03-25 NOTE — Telephone Encounter (Signed)
Pradaxa?  Doe he want me to go ahead and send to his pharmacy and have them run it through for cost.

## 2014-03-25 NOTE — Telephone Encounter (Signed)
Called and lvm with medication name and recommendation.[name]

## 2014-03-30 MED ORDER — DABIGATRAN ETEXILATE MESYLATE 150 MG PO CAPS: 150.0000 mg | ORAL_CAPSULE | Freq: Two times a day (BID) | ORAL | Status: DC

## 2014-03-30 NOTE — Telephone Encounter (Signed)
Pt called and informed that Dr. Romilda Garret would like to have MRI done. Pt reports that the numbness has been going on since December 2014. Also the Pradaxa was not sent to his pharmacy and he is ok with this.Jerome Hicks

## 2014-03-30 NOTE — Telephone Encounter (Signed)
He wanted to stop his Coumadin and then come in to make sure that his INR is under 2.0 before he starts the Prozac so. I went ahead and sent a prescription to his pharmacy so he can check on coverage and make sure that it's going to be an affordable option.

## 2014-03-30 NOTE — Telephone Encounter (Signed)
Pt notified.Jerome Hicks

## 2014-03-30 NOTE — Telephone Encounter (Signed)
Please call patient: We got notes from Dr. Romilda Garret office. Because he still has persistent hand numbness they recommend further evaluation. He can certainly place an order for an MRI for further evaluation of his neck. Please document how long he's been having symptoms in total. Let me know if she's okay with MRI.

## 2014-04-01 ENCOUNTER — Telehealth: Payer: Self-pay | Admitting: *Deleted

## 2014-04-01 NOTE — Telephone Encounter (Signed)
PA obtained for MRI Cervical Spine w/o contrast. Auth # [id number].  Oscar La, LPN

## 2014-04-06 ENCOUNTER — Ambulatory Visit (HOSPITAL_BASED_OUTPATIENT_CLINIC_OR_DEPARTMENT_OTHER)
Admission: RE | Admit: 2014-04-06 | Discharge: 2014-04-06 | Disposition: A | Discharge status: Home / Self Care | Payer: Medicare HMO | Source: Ambulatory Visit | Attending: Family Medicine | Admitting: Family Medicine

## 2014-04-06 DIAGNOSIS — M47812 Spondylosis without myelopathy or radiculopathy, cervical region: Secondary | ICD-10-CM | POA: Insufficient documentation

## 2014-04-06 DIAGNOSIS — M129 Arthropathy, unspecified: Secondary | ICD-10-CM | POA: Insufficient documentation

## 2014-04-06 DIAGNOSIS — R200 Anesthesia of skin: Secondary | ICD-10-CM

## 2014-04-08 ENCOUNTER — Other Ambulatory Visit: Payer: Self-pay | Admitting: Family Medicine

## 2014-04-08 DIAGNOSIS — M5412 Radiculopathy, cervical region: Secondary | ICD-10-CM

## 2014-04-15 ENCOUNTER — Encounter: Payer: Self-pay | Admitting: Family Medicine

## 2014-04-15 ENCOUNTER — Telehealth: Payer: Self-pay | Admitting: *Deleted

## 2014-04-15 ENCOUNTER — Ambulatory Visit (INDEPENDENT_AMBULATORY_CARE_PROVIDER_SITE_OTHER): Payer: Medicare HMO | Admitting: Family Medicine

## 2014-04-15 ENCOUNTER — Ambulatory Visit: Payer: Self-pay | Admitting: Family Medicine

## 2014-04-15 VITALS — BP 142/88 | HR 79 | Wt 293.0 lb

## 2014-04-15 DIAGNOSIS — E291 Testicular hypofunction: Secondary | ICD-10-CM

## 2014-04-15 DIAGNOSIS — I4891 Unspecified atrial fibrillation: Secondary | ICD-10-CM

## 2014-04-15 DIAGNOSIS — M5010 Cervical disc disorder with radiculopathy, unspecified cervical region: Secondary | ICD-10-CM

## 2014-04-15 DIAGNOSIS — R5383 Other fatigue: Secondary | ICD-10-CM

## 2014-04-15 DIAGNOSIS — R5381 Other malaise: Secondary | ICD-10-CM

## 2014-04-15 DIAGNOSIS — M5412 Radiculopathy, cervical region: Secondary | ICD-10-CM

## 2014-04-15 LAB — COMPLETE METABOLIC PANEL WITH GFR
ALBUMIN: 4 g/dL (ref 3.5–5.2)
ALT: 26 U/L (ref 0–53)
AST: 30 U/L (ref 0–37)
Alkaline Phosphatase: 61 U/L (ref 39–117)
BUN: 15 mg/dL (ref 6–23)
CALCIUM: 9 mg/dL (ref 8.4–10.5)
CHLORIDE: 104 meq/L (ref 96–112)
CO2: 27 meq/L (ref 19–32)
Creat: 0.85 mg/dL (ref 0.50–1.35)
Glucose, Bld: 110 mg/dL — ABNORMAL HIGH (ref 70–99)
POTASSIUM: 4.2 meq/L (ref 3.5–5.3)
SODIUM: 140 meq/L (ref 135–145)
TOTAL PROTEIN: 6.7 g/dL (ref 6.0–8.3)
Total Bilirubin: 3.4 mg/dL — ABNORMAL HIGH (ref 0.2–1.2)

## 2014-04-15 LAB — CBC WITH DIFFERENTIAL/PLATELET
Basophils Absolute: 0 10*3/uL (ref 0.0–0.1)
Basophils Relative: 0 % (ref 0–1)
Eosinophils Absolute: 0.1 10*3/uL (ref 0.0–0.7)
Eosinophils Relative: 2 % (ref 0–5)
HEMATOCRIT: 40.1 % (ref 39.0–52.0)
HEMOGLOBIN: 14.1 g/dL (ref 13.0–17.0)
Lymphocytes Relative: 35 % (ref 12–46)
Lymphs Abs: 2.4 10*3/uL (ref 0.7–4.0)
MCH: 31.7 pg (ref 26.0–34.0)
MCHC: 35.2 g/dL (ref 30.0–36.0)
MCV: 90.1 fL (ref 78.0–100.0)
MONO ABS: 0.5 10*3/uL (ref 0.1–1.0)
MONOS PCT: 7 % (ref 3–12)
NEUTROS ABS: 3.8 10*3/uL (ref 1.7–7.7)
Neutrophils Relative %: 56 % (ref 43–77)
Platelets: 228 10*3/uL (ref 150–400)
RBC: 4.45 MIL/uL (ref 4.22–5.81)
RDW: 12.9 % (ref 11.5–15.5)
WBC: 6.8 10*3/uL (ref 4.0–10.5)

## 2014-04-15 LAB — POCT INR: INR: 2.7

## 2014-04-15 LAB — VITAMIN B12: Vitamin B-12: 254 pg/mL (ref 211–911)

## 2014-04-15 LAB — FERRITIN: Ferritin: 198 ng/mL (ref 22–322)

## 2014-04-15 LAB — TSH: TSH: 2.365 u[IU]/mL (ref 0.350–4.500)

## 2014-04-15 MED ORDER — TESTOSTERONE CYPIONATE 200 MG/ML IM SOLN
200.0000 mg | Freq: Once | INTRAMUSCULAR | Status: AC
  Administered 2014-04-15: 200 mg via INTRAMUSCULAR

## 2014-04-15 NOTE — Progress Notes (Signed)
Pt here for INR check no missed doses, diet changes,bruising,bleeding,CP,SOB.[name]

## 2014-04-15 NOTE — Progress Notes (Deleted)
Subjective:     Patient ID: Jerome Hicks, male   DOB: December 24, 1950, 63 y.o.   MRN: [id number]  HPI   Review of Systems     Objective:   Physical Exam     Assessment:     ***    Plan:     ***

## 2014-04-15 NOTE — Telephone Encounter (Signed)
Pt came in and gave a # to call his insurance company so that a tier exception can be done for Pradaxa. [phone] will forward to [facility].to f/u on this.Jerome Hicks

## 2014-04-15 NOTE — Progress Notes (Signed)
   Subjective:    Patient ID: Jerome Hicks, male    DOB: [birth year], 63 y.o.   MRN: [id number]  HPI Extreme fatigue lately. No CP or no change in SOB.  No blood in the urine or stool., no swollen LN.  no recent upper respiratory infections, colds or other viruses. No fever chills or sweats. He denies snoring and says he sleeps well. He denis any other sxs.   Hypogonadism-he was unable to afford the AndroGel. It was $85 for 1 bottle. Wants to discuss alternative options.  Atrial fibrillation-he would really like to get off Coumadin has taken to Pradaxa with his insurance. It will require prior authorization so we'll need to call them. Review of Systems     Objective:   Physical Exam  Constitutional: He is oriented to person, place, and time. He appears well-developed and well-nourished.  HENT:  Head: Normocephalic and atraumatic.  Right Ear: External ear normal.  Left Ear: External ear normal.  Nose: Nose normal.  Mouth/Throat: Oropharynx is clear and moist.  TMs and canals are clear.   Eyes: Conjunctivae and EOM are normal. Pupils are equal, round, and reactive to light.  Neck: Neck supple. No thyromegaly present.  Cardiovascular: Normal rate and normal heart sounds.   Pulmonary/Chest: Effort normal and breath sounds normal.  Musculoskeletal:  Edema is under good control overall. Right ankle slightly larger than the left. He does have his compression stockings on today which is fantastic.  Lymphadenopathy:    He has no cervical adenopathy.  Neurological: He is alert and oriented to person, place, and time.  Skin: Skin is warm and dry.  Psychiatric: He has a normal mood and affect.          Assessment & Plan:  Extreme fatigue-unclear etiology at this point. It seems to be more recent. He was worried it is related to medications but none of his medications or new. We'll check a CBC and evaluate for anemia and thyroid problems.  Hypogonadism-we could certainly try  injections. We'll give first injection here today. Sometimes with Medicare this is more cost effective than buying the topical testosterone.  Bilateral hand numbness/cervical disc disease-he has an appointment with orthopedist on Monday. I printed out copies of his radiology reports and he is to go downstairs to get copies of the images burn on CD to take with him to his appointment.  Atrial fibrillation-see Coumadin flow sheet. We will call to try to get medication authorized to cover for atrial fibrillation.  Followup in late June early July for wellness exam.

## 2014-04-15 NOTE — Telephone Encounter (Signed)
Tier exception faxed.  Oscar La, LPN

## 2014-04-15 NOTE — Telephone Encounter (Signed)
pradaxa is covered but it needs tier exception form which is being faxed by insurance.  Oscar La, LPN

## 2014-04-19 ENCOUNTER — Encounter: Payer: Self-pay | Admitting: Family Medicine

## 2014-05-13 ENCOUNTER — Ambulatory Visit: Payer: Self-pay | Admitting: Family Medicine

## 2014-05-13 ENCOUNTER — Ambulatory Visit (INDEPENDENT_AMBULATORY_CARE_PROVIDER_SITE_OTHER): Payer: Commercial Managed Care - HMO | Admitting: Family Medicine

## 2014-05-13 DIAGNOSIS — E291 Testicular hypofunction: Secondary | ICD-10-CM

## 2014-05-13 DIAGNOSIS — I4891 Unspecified atrial fibrillation: Secondary | ICD-10-CM

## 2014-05-13 LAB — POCT INR: INR: 3

## 2014-05-13 MED ORDER — LISINOPRIL 10 MG PO TABS: 10.0000 mg | ORAL_TABLET | Freq: Every day | ORAL | Status: DC

## 2014-05-13 MED ORDER — TESTOSTERONE CYPIONATE 200 MG/ML IM SOLN: 200.0000 mg | Freq: Once | INTRAMUSCULAR | Status: DC

## 2014-05-13 NOTE — Progress Notes (Signed)
.  Pt here for INR check no missed doses, diet changes,bruising,bleeding,CP,SOB.Jerome Hicks [name]   

## 2014-05-13 NOTE — Progress Notes (Signed)
   Subjective:    Patient ID: Jerome Hicks, male    DOB: 05-16-1951, 63 y.o.   MRN: [id number]  HPI Here today for coumadin check and for testosterone injection.     Review of Systems     Objective:   Physical Exam        Assessment & Plan:  See coumadin flowsheet.  Also here for a testosterone injection.

## 2014-05-13 NOTE — Progress Notes (Deleted)
Pt here for testosterone injection no SOB,CP or mood swings. Injection tolerated well given RUOQ.Audelia Hives [place]

## 2014-05-13 NOTE — Progress Notes (Signed)
Pt here for testosterone injection no SOB,CP or mood swings. Injection tolerated well given in RUOQ.Audelia Hives [place]

## 2014-05-19 ENCOUNTER — Other Ambulatory Visit: Payer: Self-pay | Admitting: *Deleted

## 2014-05-19 MED ORDER — DABIGATRAN ETEXILATE MESYLATE 150 MG PO CAPS: 150.0000 mg | ORAL_CAPSULE | Freq: Two times a day (BID) | ORAL | Status: DC

## 2014-05-28 ENCOUNTER — Telehealth: Payer: Self-pay | Admitting: *Deleted

## 2014-05-28 NOTE — Telephone Encounter (Signed)
Pt told pt that he is to take pradaxa BID and to d/c coumadin. And he will need to d/c pradaxa for 3 days before surgery. Called pt back and told him that he should stay on coumadin and he will need to be off of it for 5 days prior to his surgery and after he has had surgery he can begin pradaxa.Jerome Hicks

## 2014-06-08 ENCOUNTER — Ambulatory Visit (INDEPENDENT_AMBULATORY_CARE_PROVIDER_SITE_OTHER): Payer: Commercial Managed Care - HMO | Admitting: Family Medicine

## 2014-06-08 ENCOUNTER — Encounter: Payer: Self-pay | Admitting: Family Medicine

## 2014-06-08 VITALS — BP 136/79 | HR 68 | Wt 298.0 lb

## 2014-06-08 DIAGNOSIS — J984 Other disorders of lung: Secondary | ICD-10-CM

## 2014-06-08 DIAGNOSIS — I4891 Unspecified atrial fibrillation: Secondary | ICD-10-CM

## 2014-06-08 DIAGNOSIS — I10 Essential (primary) hypertension: Secondary | ICD-10-CM

## 2014-06-08 DIAGNOSIS — Z0000 Encounter for general adult medical examination without abnormal findings: Secondary | ICD-10-CM

## 2014-06-08 DIAGNOSIS — N183 Chronic kidney disease, stage 3 unspecified: Secondary | ICD-10-CM

## 2014-06-08 DIAGNOSIS — E291 Testicular hypofunction: Secondary | ICD-10-CM

## 2014-06-08 LAB — COMPLETE METABOLIC PANEL WITH GFR
ALT: 22 U/L (ref 0–53)
AST: 23 U/L (ref 0–37)
Albumin: 4.3 g/dL (ref 3.5–5.2)
Alkaline Phosphatase: 62 U/L (ref 39–117)
BILIRUBIN TOTAL: 3.5 mg/dL — AB (ref 0.2–1.2)
BUN: 19 mg/dL (ref 6–23)
CO2: 30 meq/L (ref 19–32)
Calcium: 9.3 mg/dL (ref 8.4–10.5)
Chloride: 104 mEq/L (ref 96–112)
Creat: 0.92 mg/dL (ref 0.50–1.35)
GFR, EST NON AFRICAN AMERICAN: 88 mL/min
GFR, Est African American: >89 mL/min
GLUCOSE: 117 mg/dL — AB (ref 70–99)
POTASSIUM: 4.4 meq/L (ref 3.5–5.3)
SODIUM: 140 meq/L (ref 135–145)
TOTAL PROTEIN: 6.9 g/dL (ref 6.0–8.3)

## 2014-06-08 LAB — HEMOGLOBIN A1C
Hgb A1c MFr Bld: 5.1 % (ref <5.7)
Mean Plasma Glucose: 100 mg/dL (ref <117)

## 2014-06-08 LAB — LIPID PANEL
Cholesterol: 123 mg/dL (ref 0–200)
HDL: 42 mg/dL (ref >39)
LDL CALC: 69 mg/dL (ref 0–99)
Total CHOL/HDL Ratio: 2.9 Ratio
Triglycerides: 58 mg/dL (ref <150)
VLDL: 12 mg/dL (ref 0–40)

## 2014-06-08 NOTE — Progress Notes (Signed)
Subjective:    Jerome Hicks is a 63 y.o. male who presents for Medicare Annual/Subsequent preventive examination.   Preventive Screening-Counseling & Management  Tobacco History  Smoking status  . Never Smoker   Smokeless tobacco  . Not on file    Problems Prior to Visit 1. Off coumadin for surgery that is scheduled tomorrow.    Current Problems (verified) Patient Active Problem List   Diagnosis Date Noted  . Hypogonadism male 02/26/2014  . Lower leg edema 12/02/2013  . Sepsis 12/02/2013  . Hypotension, unspecified 12/02/2013  . Acute renal failure 12/02/2013  . Lymph node enlargement 12/02/2013  . CKD (chronic kidney disease) stage 3, GFR 30-59 ml/min 05/28/2013  . Atrial fibrillation 06/25/2012  . BMI 36.0-36.9,adult 06/19/2012  . 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 RESTRICTIVE LUNG DISEASE 01/12/2011  . ERECTILE DYSFUNCTION 01/06/2008  . ESSENTIAL HYPERTENSION, BENIGN 01/05/2008  . UNSPECIFIED VENOUS INSUFFICIENCY 01/05/2008    Medications Prior to Visit Current Outpatient Prescriptions on File Prior to Visit  Medication Sig Dispense Refill  . AMBULATORY NON FORMULARY MEDICATION Medication Name: request to discontinue oxygen. please come pick up O2 tanks.  1 each  0  . carvedilol (COREG) 3.125 MG tablet Take 1 tablet (3.125 mg total) by mouth 2 (two) times daily with a meal.  180 tablet  2  . clotrimazole-betamethasone (LOTRISONE) cream Apply 1 application topically.      . dabigatran (PRADAXA) 150 MG CAPS capsule Take 1 capsule (150 mg total) by mouth 2 (two) times daily.  60 capsule  11  . doxazosin (CARDURA) 4 MG tablet Take 1 tablet (4 mg total) by mouth daily.  90 tablet  2  . furosemide (LASIX) 40 MG tablet Take 80 mg by mouth daily. Take 1 tablet daily      . lisinopril (PRINIVIL,ZESTRIL) 10 MG tablet Take 1 tablet (10 mg total) by mouth daily.  90 tablet  3  . meclizine (ANTIVERT) 25 MG tablet TAKE ONE TABLET BY MOUTH THREE TIMES DAILY AS NEEDED FOR  DIZZINESS OR NAUSEA AS DIRECTED  90 tablet  0  . potassium chloride (K-DUR) 10 MEQ tablet Take 1 tablet (10 mEq total) by mouth as needed. Take w/ fluid pill  90 tablet  1  . traMADol (ULTRAM) 50 MG tablet Take 1 tablet (50 mg total) by mouth every 8 (eight) hours as needed.  270 tablet  1  . triamcinolone ointment (KENALOG) 0.1 %       . warfarin (COUMADIN) 5 MG tablet        No current facility-administered medications on file prior to visit.    Current Medications (verified) Current Outpatient Prescriptions  Medication Sig Dispense Refill  . AMBULATORY NON FORMULARY MEDICATION Medication Name: request to discontinue oxygen. please come pick up O2 tanks.  1 each  0  . carvedilol (COREG) 3.125 MG tablet Take 1 tablet (3.125 mg total) by mouth 2 (two) times daily with a meal.  180 tablet  2  . clotrimazole-betamethasone (LOTRISONE) cream Apply 1 application topically.      . dabigatran (PRADAXA) 150 MG CAPS capsule Take 1 capsule (150 mg total) by mouth 2 (two) times daily.  60 capsule  11  . doxazosin (CARDURA) 4 MG tablet Take 1 tablet (4 mg total) by mouth daily.  90 tablet  2  . furosemide (LASIX) 40 MG tablet Take 80 mg by mouth daily. Take 1 tablet daily      . lisinopril (PRINIVIL,ZESTRIL) 10 MG tablet Take 1 tablet (10  mg total) by mouth daily.  90 tablet  3  . meclizine (ANTIVERT) 25 MG tablet TAKE ONE TABLET BY MOUTH THREE TIMES DAILY AS NEEDED FOR DIZZINESS OR NAUSEA AS DIRECTED  90 tablet  0  . potassium chloride (K-DUR) 10 MEQ tablet Take 1 tablet (10 mEq total) by mouth as needed. Take w/ fluid pill  90 tablet  1  . traMADol (ULTRAM) 50 MG tablet Take 1 tablet (50 mg total) by mouth every 8 (eight) hours as needed.  270 tablet  1  . triamcinolone ointment (KENALOG) 0.1 %       . warfarin (COUMADIN) 5 MG tablet        No current facility-administered medications for this visit.     Allergies (verified) Penicillins   PAST HISTORY  Family History Family History  Problem  Relation Age of Onset  . Heart attack Mother 2  . Heart attack Father 23  . Heart attack Sister 37  . Heart attack Brother 108  . Multiple sclerosis Daughter     Social History History  Substance Use Topics  . Smoking status: Never Smoker   . Smokeless tobacco: Not on file  . Alcohol Use: No    Are there smokers in your home (other than you)?  No  Risk Factors Current exercise habits: The patient does not participate in regular exercise at present.  Dietary issues discussed: None   Cardiac risk factors: advanced age (older than 17 for men, 50 for women), hypertension, male gender, obesity (BMI >= 30 kg/m2) and sedentary lifestyle.  Depression Screen (Note: if answer to either of the following is "Yes", a more complete depression screening is indicated)   Q1: Over the past two weeks, have you felt down, depressed or hopeless? No  Q2: Over the past two weeks, have you felt little interest or pleasure in doing things? No  Have you lost interest or pleasure in daily life? No  Do you often feel hopeless? No  Do you cry easily over simple problems? No  Activities of Daily Living In your present state of health, do you have any difficulty performing the following activities?:  Driving? No Managing money?  No Feeding yourself? No Getting from bed to chair? No Climbing a flight of stairs? No Preparing food and eating?: No Bathing or showering? No Getting dressed: No Getting to the toilet? No Using the toilet:No Moving around from place to place: No In the past year have you fallen or had a near fall?:No   Are you sexually active?  No  Do you have more than one partner?  No  Hearing Difficulties: No Do you often ask people to speak up or repeat themselves? No Do you experience ringing or noises in your ears? No Do you have difficulty understanding soft or whispered voices? No   Do you feel that you have a problem with memory? No  Do you often misplace items? No  Do you  feel safe at home?  No  Cognitive Testing  Alert? Yes  Normal Appearance? Yes  Oriented to person? Yes  Place? Yes   Time? Yes  Recall of three objects?  Yes  Can perform simple calculations? Yes  Displays appropriate judgment?Yes  Can read the correct time from a watch face?Yes   Advanced Directives have been discussed with the patient? Yes   List the Names of Other Physician/Practitioners you currently use: 1.    Indicate any recent Medical Services you may have received from other than  [facility] providers in the past year (date may be approximate).  Immunization History  Administered Date(s) Administered  . Influenza Split 08/21/2012  . Influenza Whole 08/27/2008, 11/07/2009, 10/18/2010, 08/20/2011  . Influenza,inj,Quad PF,36+ Mos 08/25/2013  . Pneumococcal Conjugate-13 09/29/2013  . Td 12/03/1994, 06/15/2009  . Zoster 06/18/2011    Screening Tests Health Maintenance  Topic Date Due  . Influenza Vaccine  07/03/2014  . Colonoscopy  05/06/2016  . Tetanus/tdap  06/16/2019  . Zostavax  Completed    All answers were reviewed with the patient and necessary referrals were made:  Caliph Borowiak, MD   06/08/2014   History reviewed: allergies, current medications, past family history, past medical history, past social history, past surgical history and problem list  Review of Systems A comprehensive review of systems was negative.    Objective:     Vision by Snellen chart: eye exam is UTD.  There were no vitals taken for this visit. There is no weight on file to calculate BMI.  BP 136/79  Pulse 68  Wt 298 lb (135.172 kg)  General Appearance:    Alert, cooperative, no distress, appears stated age  Head:    Normocephalic, without obvious abnormality, atraumatic  Eyes:    PERRL, conjunctiva/corneas clear, EOM's intact, both eyes       Ears:    Normal TM's and external ear canals, both ears  Nose:   Nares normal, septum midline, mucosa normal, no drainage    or sinus  tenderness  Throat:   Lips, mucosa, and tongue normal; teeth and gums normal  Neck:   Supple, symmetrical, trachea midline, no adenopathy;       thyroid:  No enlargement/tenderness/nodules; no carotid   bruit or JVD  Back:     Symmetric, no curvature, ROM normal, no CVA tenderness  Lungs:     Clear to auscultation bilaterally, respirations unlabored  Chest wall:    No tenderness or deformity  Heart:    Regular rate and rhythm, S1 and S2 normal, no murmur, rub   or gallop  Abdomen:     Soft, non-tender, bowel sounds active all four quadrants,    no masses, no organomegaly  Genitalia:    Not perfromed  Rectal:    Not performed  Extremities:   Extremities normal, atraumatic, no cyanosis or edema  Pulses:   2+ and symmetric all extremities  Skin:   Skin color, texture, turgor normal, no rashes or lesions  Lymph nodes:   Cervical, supraclavicular, and axillary nodes normal  Neurologic:   CNII-XII intact. Normal strength, sensation and reflexes      throughout       Assessment:    Medicare Annual Wellness Exam      Plan:     During the course of the visit the patient was educated and counseled about appropriate screening and preventive services including:    Prostate cancer screening  Due for labwork. Slip printed today.  Vaccines are up to date.    Due for pneumonia vaccine next November  Prostate Ca screening - check PSA.    Diet review for nutrition referral? Yes ____  Not Indicated _X_   Patient Instructions (the written plan) was given to the patient.  Medicare Attestation I have personally reviewed: The patient's medical and social history Their use of alcohol, tobacco or illicit drugs Their current medications and supplements The patient's functional ability including ADLs,fall risks, home safety risks, cognitive, and hearing and visual impairment Diet and physical activities Evidence for depression or  mood disorders  The patient's weight, height, BMI, and  visual acuity have been recorded in the chart.  I have made referrals, counseling, and provided education to the patient based on review of the above and I have provided the patient with a written personalized care plan for preventive services.     Miri Jose, MD   06/08/2014

## 2014-06-08 NOTE — Patient Instructions (Signed)
Keep up a regular exercise program and make sure you are eating a healthy diet Try to eat 4 servings of dairy a day, or if you are lactose intolerant take a calcium with vitamin D daily.  Your vaccines are up to date.   

## 2014-06-09 LAB — POCT INR: INR: 1

## 2014-06-09 LAB — PSA: PSA: 1.6 ng/mL (ref <=4.00)

## 2014-06-09 MED ORDER — TRAMADOL HCL 50 MG PO TABS: 50.0000 mg | ORAL_TABLET | Freq: Three times a day (TID) | ORAL | Status: DC | PRN

## 2014-06-09 MED ORDER — POTASSIUM CHLORIDE ER 10 MEQ PO TBCR: 10.0000 meq | EXTENDED_RELEASE_TABLET | ORAL | Status: DC | PRN

## 2014-06-09 MED ORDER — TESTOSTERONE CYPIONATE 200 MG/ML IM SOLN
200.0000 mg | Freq: Once | INTRAMUSCULAR | Status: AC
  Administered 2014-06-08: 200 mg via INTRAMUSCULAR

## 2014-06-09 NOTE — Addendum Note (Signed)
Addended by: [name] on: 06/09/2014 09:43 AM   Modules accepted: Orders, Medications

## 2014-06-10 ENCOUNTER — Other Ambulatory Visit: Payer: Self-pay | Admitting: Family Medicine

## 2014-06-10 DIAGNOSIS — R17 Unspecified jaundice: Secondary | ICD-10-CM

## 2014-06-15 ENCOUNTER — Ambulatory Visit (INDEPENDENT_AMBULATORY_CARE_PROVIDER_SITE_OTHER): Payer: Commercial Managed Care - HMO

## 2014-06-15 DIAGNOSIS — R17 Unspecified jaundice: Secondary | ICD-10-CM

## 2014-06-16 ENCOUNTER — Other Ambulatory Visit: Payer: Self-pay | Admitting: Family Medicine

## 2014-06-16 DIAGNOSIS — R17 Unspecified jaundice: Secondary | ICD-10-CM

## 2014-06-18 ENCOUNTER — Telehealth: Payer: Self-pay | Admitting: *Deleted

## 2014-06-18 NOTE — Telephone Encounter (Signed)
MRI approval via fax from [place] [id number] valid 06/17/14 thru 12/18/14. Margette Fast, CMA

## 2014-06-26 ENCOUNTER — Ambulatory Visit (HOSPITAL_BASED_OUTPATIENT_CLINIC_OR_DEPARTMENT_OTHER)
Admission: RE | Admit: 2014-06-26 | Discharge: 2014-06-26 | Disposition: A | Discharge status: Home / Self Care | Payer: Medicare HMO | Source: Ambulatory Visit | Attending: Family Medicine | Admitting: Family Medicine

## 2014-06-26 DIAGNOSIS — R17 Unspecified jaundice: Secondary | ICD-10-CM

## 2014-06-26 MED ORDER — GADOBENATE DIMEGLUMINE 529 MG/ML IV SOLN: 20.0000 mL | Freq: Once | INTRAVENOUS | Status: AC | PRN

## 2014-07-06 ENCOUNTER — Ambulatory Visit (INDEPENDENT_AMBULATORY_CARE_PROVIDER_SITE_OTHER): Payer: Commercial Managed Care - HMO | Admitting: Family Medicine

## 2014-07-06 ENCOUNTER — Other Ambulatory Visit: Payer: Self-pay | Admitting: *Deleted

## 2014-07-06 ENCOUNTER — Other Ambulatory Visit: Payer: Self-pay

## 2014-07-06 VITALS — BP 128/83 | HR 76 | Temp 98.2°F | Ht 75.0 in | Wt 300.0 lb

## 2014-07-06 DIAGNOSIS — E291 Testicular hypofunction: Secondary | ICD-10-CM

## 2014-07-06 MED ORDER — CARVEDILOL 3.125 MG PO TABS: 3.1250 mg | ORAL_TABLET | Freq: Two times a day (BID) | ORAL | Status: DC

## 2014-07-06 MED ORDER — MECLIZINE HCL 25 MG PO TABS: ORAL_TABLET | ORAL | Status: DC

## 2014-07-06 MED ORDER — DOXAZOSIN MESYLATE 4 MG PO TABS: 4.0000 mg | ORAL_TABLET | Freq: Every day | ORAL | Status: DC

## 2014-07-06 MED ORDER — TESTOSTERONE CYPIONATE 200 MG/ML IM SOLN
200.0000 mg | Freq: Once | INTRAMUSCULAR | Status: AC
  Administered 2014-07-06: 200 mg via INTRAMUSCULAR

## 2014-07-06 MED ORDER — FUROSEMIDE 40 MG PO TABS: 80.0000 mg | ORAL_TABLET | Freq: Every day | ORAL | Status: DC

## 2014-07-06 NOTE — Progress Notes (Signed)
   Subjective:    Patient ID: Jerome Hicks, male    DOB: 1951-07-23, 63 y.o.   MRN: [id number]  HPI    Review of Systems     Objective:   Physical Exam        Assessment & Plan:  Pt came in today for his testosterone injection and tolerated well. He received 200 mg in the RUOQ. Pt denies chest pain, SOB, ha, palpitations, dizziness or mood changes./ Estil Daft

## 2014-07-08 DIAGNOSIS — S91209A Unspecified open wound of unspecified toe(s) with damage to nail, initial encounter: Secondary | ICD-10-CM | POA: Insufficient documentation

## 2014-07-20 ENCOUNTER — Ambulatory Visit (INDEPENDENT_AMBULATORY_CARE_PROVIDER_SITE_OTHER): Payer: Commercial Managed Care - HMO | Admitting: Family Medicine

## 2014-07-20 VITALS — BP 138/89 | HR 76 | Ht 75.0 in | Wt 302.0 lb

## 2014-07-20 DIAGNOSIS — E291 Testicular hypofunction: Secondary | ICD-10-CM

## 2014-07-20 MED ORDER — TESTOSTERONE CYPIONATE 200 MG/ML IM SOLN
200.0000 mg | Freq: Once | INTRAMUSCULAR | Status: AC
  Administered 2014-07-20: 200 mg via INTRAMUSCULAR

## 2014-07-20 NOTE — Progress Notes (Signed)
   Subjective:    Patient ID: Jerome Hicks, male    DOB: 1950-12-30, 63 y.o.   MRN: [id number]  HPI  Here for testosterone imjection   Review of Systems     Objective:   Physical Exam        Assessment & Plan:

## 2014-08-05 ENCOUNTER — Encounter: Payer: Self-pay | Admitting: Family Medicine

## 2014-08-05 ENCOUNTER — Ambulatory Visit (INDEPENDENT_AMBULATORY_CARE_PROVIDER_SITE_OTHER): Payer: Commercial Managed Care - HMO | Admitting: Family Medicine

## 2014-08-05 VITALS — BP 132/70 | HR 72 | Ht 75.0 in | Wt 306.0 lb

## 2014-08-05 DIAGNOSIS — Z23 Encounter for immunization: Secondary | ICD-10-CM

## 2014-08-05 DIAGNOSIS — R42 Dizziness and giddiness: Secondary | ICD-10-CM

## 2014-08-05 DIAGNOSIS — E291 Testicular hypofunction: Secondary | ICD-10-CM

## 2014-08-05 MED ORDER — TESTOSTERONE CYPIONATE 200 MG/ML IM SOLN
200.0000 mg | Freq: Once | INTRAMUSCULAR | Status: AC
  Administered 2014-08-05: 200 mg via INTRAMUSCULAR

## 2014-08-05 NOTE — Progress Notes (Signed)
Pt here for testosterone injection no SOB,CP or mood swings. Injection tolerated well given in LUOQ pt to RTC in 2 weeks.Audelia Hives [place]

## 2014-08-05 NOTE — Progress Notes (Signed)
Subjective:    Patient ID: Jerome Hicks, male    DOB: December 08, 1950, 63 y.o.   MRN: [id number]  HPI pt reports that he had a dizzy spell last thursday that followed with a headache he stated that he was on the computer when this happened. Says was sittin down adn felt sweaty and then lightheaded. Lasted 5-10 minutes.  Checked BP was 123/70.  NO change or increase in swelling.  Has happened again since then.  No CP or palpitations.   Review of Systems  BP 132/70  Pulse 72  Ht 6\' 3"  (1.905 m)  Wt 306 lb (138.801 kg)  BMI 38.25 kg/m2  SpO2 96%    Allergies  Allergen Reactions  . Penicillins     Past Medical History  Diagnosis Date  . Kidney stones   . 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 ED (erectile dysfunction)     has been prescribed Levitra and Viagrain in the past  . Scrotal abscess 3-03  . Leg edema     recurrent cellulitis  . Internal hemorrhoids   . Sigmoid diverticulosis   . Atrial fibrillation   . Hypertension     Past Surgical History  Procedure Laterality Date  . Cholecystectomy  1971  . Kidney stones  1990's    Ureteroscopic stone extraction  .  mrsa infection in left groin requiring surgical debridement  12-08  . Tonsillectomy      History   Social History  . Marital Status: Widowed    Spouse Name: N/A    Number of Children: 2  . Years of Education: N/A   Occupational History  . On disability     Social History Main Topics  . Smoking status: Never Smoker   . Smokeless tobacco: Not on file  . Alcohol Use: No  . Drug Use: No  . Sexual Activity:    Other Topics Concern  . Not on file   Social History Narrative   Staying active    Family History  Problem Relation Age of Onset  . Heart attack Mother 17  . Heart attack Father 67  . Heart attack Sister 51  . Heart attack Brother 2  . Multiple sclerosis Daughter     Outpatient Encounter Prescriptions as of 08/05/2014  Medication Sig  . AMBULATORY NON FORMULARY MEDICATION  Medication Name: request to discontinue oxygen. please come pick up O2 tanks.  . carvedilol (COREG) 3.125 MG tablet Take 1 tablet (3.125 mg total) by mouth 2 (two) times daily with a meal.  . clotrimazole-betamethasone (LOTRISONE) cream Apply 1 application topically.  . dabigatran (PRADAXA) 150 MG CAPS capsule Take 1 capsule (150 mg total) by mouth 2 (two) times daily.  Marland Kitchen doxazosin (CARDURA) 4 MG tablet Take 1 tablet (4 mg total) by mouth daily.  . furosemide (LASIX) 40 MG tablet Take 2 tablets (80 mg total) by mouth daily. Take 1 tablet daily  . lisinopril (PRINIVIL,ZESTRIL) 10 MG tablet Take 1 tablet (10 mg total) by mouth daily.  . meclizine (ANTIVERT) 25 MG tablet TAKE ONE TABLET BY MOUTH THREE TIMES DAILY AS NEEDED FOR DIZZINESS OR NAUSEA AS DIRECTED  . oxyCODONE (OXY IR/ROXICODONE) 5 MG immediate release tablet   . potassium chloride (K-DUR) 10 MEQ tablet Take 1 tablet (10 mEq total) by mouth as needed. Take w/ fluid pill  . traMADol (ULTRAM) 50 MG tablet Take 1 tablet (50 mg total) by mouth every 8 (eight) hours as needed.  . triamcinolone ointment (  KENALOG) 0.1 %   . [EXPIRED] testosterone cypionate (DEPOTESTOTERONE CYPIONATE) injection 200 mg           Objective:   Physical Exam  Constitutional: He is oriented to person, place, and time. He appears well-developed and well-nourished.  HENT:  Head: Normocephalic and atraumatic.  Right Ear: External ear normal.  Left Ear: External ear normal.  Nose: Nose normal.  Mouth/Throat: Oropharynx is clear and moist.  TMs and canals are clear.   Eyes: Conjunctivae and EOM are normal. Pupils are equal, round, and reactive to light.  Neck: Neck supple. No thyromegaly present.  Cardiovascular: Normal rate and normal heart sounds.   Pulses:      Carotid pulses are 0 on the right side, and 0 on the left side. No carotid bruits.   Pulmonary/Chest: Effort normal and breath sounds normal.  Lymphadenopathy:    He has no cervical adenopathy.   Neurological: He is alert and oriented to person, place, and time.  Skin: Skin is warm and dry.  Psychiatric: He has a normal mood and affect.          Assessment & Plan:  Dizziness-unclear etiology at this point. His blood pressure immediately after was normal. He had plenty of fluid a drink and does not feel like he was dehydrated. He had eaten earlier that morning but had not eaten lunch so certainly could consider a hypoglycemic event that he is not diabetic and has never had this before. He was not having any chest pain or palpitations at that time so consider cardiac but still less likely. If symptoms recur then please let me know and consider cardiac monitor. Has had atrial fibrillation. He is in normal sinus rhythm today. Just had labs about a month ago they're all fairly normal. And fluid status is stable. Orthostatics are normal as well.   I noticed his weight has gone up a few pounds in a short period time. Recommend Inc lasix to 2 tabs for next couple of days to try get a couple pounds of fluid off..   Elevated bilirubin. Discussed results of MRP with him. Results were normal.  Given flu shot today.

## 2014-08-19 ENCOUNTER — Encounter: Payer: Self-pay | Admitting: *Deleted

## 2014-08-19 ENCOUNTER — Ambulatory Visit (INDEPENDENT_AMBULATORY_CARE_PROVIDER_SITE_OTHER): Payer: Commercial Managed Care - HMO | Admitting: Family Medicine

## 2014-08-19 VITALS — BP 135/86 | HR 90 | Ht 74.0 in | Wt 301.0 lb

## 2014-08-19 DIAGNOSIS — E291 Testicular hypofunction: Secondary | ICD-10-CM

## 2014-08-19 DIAGNOSIS — R609 Edema, unspecified: Secondary | ICD-10-CM

## 2014-08-19 DIAGNOSIS — R17 Unspecified jaundice: Secondary | ICD-10-CM

## 2014-08-19 LAB — HEPATIC FUNCTION PANEL
ALT: 19 U/L (ref 0–53)
AST: 21 U/L (ref 0–37)
Albumin: 3.6 g/dL (ref 3.5–5.2)
Alkaline Phosphatase: 55 U/L (ref 39–117)
BILIRUBIN DIRECT: 0.5 mg/dL — AB (ref 0.0–0.3)
BILIRUBIN INDIRECT: 2.5 mg/dL — AB (ref 0.2–1.2)
BILIRUBIN TOTAL: 3 mg/dL — AB (ref 0.2–1.2)
Total Protein: 6.1 g/dL (ref 6.0–8.3)

## 2014-08-19 MED ORDER — FUROSEMIDE 10 MG/ML IJ SOLN
80.0000 mg | Freq: Once | INTRAMUSCULAR | Status: AC
  Administered 2014-08-19: 80 mg via INTRAMUSCULAR

## 2014-08-19 MED ORDER — TESTOSTERONE CYPIONATE 200 MG/ML IM SOLN
200.0000 mg | Freq: Once | INTRAMUSCULAR | Status: AC
  Administered 2014-08-19: 200 mg via INTRAMUSCULAR

## 2014-08-19 NOTE — Progress Notes (Signed)
   Subjective:    Patient ID: Jerome Hicks, male    DOB: [birth year], 63 y.o.   MRN: [id number]  HPI Pt reports today for testosterone which he rec'd in his RUOQ without complication. Pt reports that he doesn't feel as if the injections are helping him as he doesn't feel any better than he did before. He denies CP, SOB or mood swings. Margette Fast, CMA     Review of Systems     Objective:   Physical Exam        Assessment & Plan:  He did report that his legs have been a little bit more swollen even though his weight is not up. Has been wearing his compression stockings. Given IM 80 mg Lasix today. Also the nurse felt that he had a little bit of jaundice appearance. Will check liver enzymes today.  Beatrice Lecher, MD

## 2014-08-24 ENCOUNTER — Encounter: Payer: Self-pay | Admitting: Family Medicine

## 2014-09-03 ENCOUNTER — Other Ambulatory Visit: Payer: Self-pay | Admitting: *Deleted

## 2014-09-03 ENCOUNTER — Ambulatory Visit (INDEPENDENT_AMBULATORY_CARE_PROVIDER_SITE_OTHER): Payer: Commercial Managed Care - HMO | Admitting: Family Medicine

## 2014-09-03 VITALS — BP 117/73 | HR 82 | Resp 16 | Wt 300.0 lb

## 2014-09-03 DIAGNOSIS — E291 Testicular hypofunction: Secondary | ICD-10-CM

## 2014-09-03 MED ORDER — DABIGATRAN ETEXILATE MESYLATE 150 MG PO CAPS: 150.0000 mg | ORAL_CAPSULE | Freq: Two times a day (BID) | ORAL | Status: DC

## 2014-09-03 MED ORDER — TESTOSTERONE CYPIONATE 200 MG/ML IM SOLN
200.0000 mg | Freq: Once | INTRAMUSCULAR | Status: AC
  Administered 2014-09-03: 200 mg via INTRAMUSCULAR

## 2014-09-03 NOTE — Progress Notes (Signed)
   Subjective:    Patient ID: Jerome Hicks, male    DOB: 1951-11-07, 63 y.o.   MRN: [id number]  HPI Patient is here for testosterone injection. Denies chest pain; shortness of breath within his normal; no headaches; no negative mood changes.    Review of Systems     Objective:   Physical Exam        Assessment & Plan:  Patient tolerated injection well without complications.

## 2014-09-17 ENCOUNTER — Ambulatory Visit (INDEPENDENT_AMBULATORY_CARE_PROVIDER_SITE_OTHER): Payer: Commercial Managed Care - HMO | Admitting: Family Medicine

## 2014-09-17 VITALS — BP 127/81 | HR 75 | Temp 98.0°F | Ht 74.0 in | Wt 301.0 lb

## 2014-09-17 DIAGNOSIS — E291 Testicular hypofunction: Secondary | ICD-10-CM

## 2014-09-17 MED ORDER — TESTOSTERONE CYPIONATE 200 MG/ML IM SOLN
200.0000 mg | Freq: Once | INTRAMUSCULAR | Status: AC
  Administered 2014-09-17: 200 mg via INTRAMUSCULAR

## 2014-09-17 NOTE — Progress Notes (Signed)
   Subjective:    Patient ID: Jerome Hicks, male    DOB: 1951/05/13, 63 y.o.   MRN: [id number]  HPI Jerome Hicks reports to office today for testosterone injection which he rec'd in his RUOQ without complication. He denies CP, SOB, mood swings or headaches and has no other GU concerns at this time. Jerome Hicks, CMA    Review of Systems     Objective:   Physical Exam        Assessment & Plan:  Reminded to schedule 2 wk follow up for next injection.

## 2014-10-01 ENCOUNTER — Ambulatory Visit (INDEPENDENT_AMBULATORY_CARE_PROVIDER_SITE_OTHER): Payer: Commercial Managed Care - HMO | Admitting: Family Medicine

## 2014-10-01 VITALS — BP 117/74 | HR 80 | Wt 300.0 lb

## 2014-10-01 DIAGNOSIS — E291 Testicular hypofunction: Secondary | ICD-10-CM

## 2014-10-01 MED ORDER — TESTOSTERONE CYPIONATE 200 MG/ML IM SOLN
200.0000 mg | Freq: Once | INTRAMUSCULAR | Status: AC
  Administered 2014-10-01: 200 mg via INTRAMUSCULAR

## 2014-10-01 NOTE — Progress Notes (Signed)
   Subjective:    Patient ID: Jerome Hicks, male    DOB: 11/01/1951, 63 y.o.   MRN: [id number]  HPI  Jerome Hicks is here for a testosterone injection. Denies chest pain, shortness of breath, headaches or mood changes.   Review of Systems     Objective:   Physical Exam        Assessment & Plan:  Patient tolerated injection well without complications. Patient advised to schedule next injection 14 days from today.  

## 2014-10-14 ENCOUNTER — Ambulatory Visit: Payer: Commercial Managed Care - HMO

## 2014-10-15 ENCOUNTER — Ambulatory Visit (INDEPENDENT_AMBULATORY_CARE_PROVIDER_SITE_OTHER): Payer: Commercial Managed Care - HMO | Admitting: Family Medicine

## 2014-10-15 VITALS — BP 108/70 | HR 78

## 2014-10-15 DIAGNOSIS — E291 Testicular hypofunction: Secondary | ICD-10-CM

## 2014-10-15 MED ORDER — TESTOSTERONE CYPIONATE 200 MG/ML IM SOLN
200.0000 mg | Freq: Once | INTRAMUSCULAR | Status: AC
  Administered 2014-10-15: 200 mg via INTRAMUSCULAR

## 2014-10-15 NOTE — Progress Notes (Signed)
   Subjective:    Patient ID: Jerome Hicks, male    DOB: 01/13/1951, 63 y.o.   MRN: [id number]  HPI  Jerome Hicks is here for a testosterone injection. Denies chest pain, shortness of breath, headaches or mood changes.   Review of Systems     Objective:   Physical Exam        Assessment & Plan:  Patient tolerated injection well without complications. Patient advised to schedule next injection 14 days from today.  

## 2014-10-27 ENCOUNTER — Ambulatory Visit (INDEPENDENT_AMBULATORY_CARE_PROVIDER_SITE_OTHER): Payer: Commercial Managed Care - HMO | Admitting: Family Medicine

## 2014-10-27 VITALS — BP 133/76 | HR 88 | Wt 299.0 lb

## 2014-10-27 DIAGNOSIS — E291 Testicular hypofunction: Secondary | ICD-10-CM

## 2014-10-27 MED ORDER — TESTOSTERONE CYPIONATE 200 MG/ML IM SOLN
200.0000 mg | Freq: Once | INTRAMUSCULAR | Status: AC
  Administered 2014-10-27: 200 mg via INTRAMUSCULAR

## 2014-10-27 NOTE — Progress Notes (Signed)
   Subjective:    Patient ID: Jerome Hicks, male    DOB: 11/23/1951, 63 y.o.   MRN: [id number]  HPI  Jerome Hicks is here for a testosterone injection. Denies chest pain, shortness of breath, headaches or mood changes.   Review of Systems     Objective:   Physical Exam        Assessment & Plan:  Patient tolerated injection well without complications. Patient advised to schedule next injection 14 days from today.  

## 2014-10-29 ENCOUNTER — Ambulatory Visit: Payer: Commercial Managed Care - HMO

## 2014-11-03 ENCOUNTER — Other Ambulatory Visit: Payer: Self-pay | Admitting: Family Medicine

## 2014-11-03 ENCOUNTER — Telehealth: Payer: Self-pay | Admitting: Family Medicine

## 2014-11-03 NOTE — Telephone Encounter (Signed)
Patient needs his medication sent to mail order asap (furosmic??? 40mg ).  Also has ? About pradaxa if you could contact him.  Thank you

## 2014-11-04 NOTE — Telephone Encounter (Signed)
Medication sent.

## 2014-11-08 NOTE — Telephone Encounter (Addendum)
Called to find out if there is a PA that is needed for this med or if his insurance is no longer covering this med for him. I was told that this was on a separate order but would be expedited now so that it will get delivered faster. Also had to clarify his furosemide to [employer] pharmacist)  indicate that he takes 1 tab PO BID PRN.Audelia Hives [employer]

## 2014-11-08 NOTE — Telephone Encounter (Signed)
Pt informed and he stated that he would have to pay a co-pay of $300 to get this. He would like to continue on this med if possible. He stated that he does not want to go back to taking coumadin. I told him that I have not seen a PA for this med and would ask Roselyn Reef or Levada Dy if a PA was started for this and that I would forward this to Dr. Madilyn Fireman for advice.Audelia Hives [place]

## 2014-11-09 NOTE — Telephone Encounter (Signed)
Called pt and informed him that we have 3wks of samples and a savings card. If his insurance doesn't take the savings card then we will need to find some other med that he can take that doesn't require weekly testing.Audelia Hives [place]

## 2014-11-09 NOTE — Telephone Encounter (Signed)
We have about 3 weeks of samples. We alos have a coupon if he is not Medicare.

## 2014-11-10 ENCOUNTER — Ambulatory Visit (INDEPENDENT_AMBULATORY_CARE_PROVIDER_SITE_OTHER): Payer: Commercial Managed Care - HMO | Admitting: Family Medicine

## 2014-11-10 VITALS — BP 129/86 | HR 73 | Wt 301.0 lb

## 2014-11-10 DIAGNOSIS — E291 Testicular hypofunction: Secondary | ICD-10-CM

## 2014-11-10 MED ORDER — TESTOSTERONE CYPIONATE 200 MG/ML IM SOLN
200.0000 mg | Freq: Once | INTRAMUSCULAR | Status: AC
  Administered 2014-11-10: 200 mg via INTRAMUSCULAR

## 2014-11-10 NOTE — Progress Notes (Signed)
   Subjective:    Patient ID: Jerome Hicks, male    DOB: 03/18/1951, 63 y.o.   MRN: [id number]  HPI  Dennise L Weiand is here for a testosterone injection. Denies chest pain, shortness of breath, headaches or mood changes.   Review of Systems     Objective:   Physical Exam        Assessment & Plan:  Patient tolerated injection well without complications. Patient advised to schedule next injection 14 days from today.  

## 2014-11-24 ENCOUNTER — Ambulatory Visit (INDEPENDENT_AMBULATORY_CARE_PROVIDER_SITE_OTHER): Payer: Commercial Managed Care - HMO | Admitting: Family Medicine

## 2014-11-24 VITALS — BP 124/76 | HR 82 | Wt 296.0 lb

## 2014-11-24 DIAGNOSIS — E291 Testicular hypofunction: Secondary | ICD-10-CM

## 2014-11-24 MED ORDER — TESTOSTERONE CYPIONATE 200 MG/ML IM SOLN
200.0000 mg | Freq: Once | INTRAMUSCULAR | Status: AC
  Administered 2014-11-24: 200 mg via INTRAMUSCULAR

## 2014-11-24 NOTE — Progress Notes (Signed)
   Subjective:    Patient ID: Jerome Hicks, male    DOB: January 07, 1951, 63 y.o.   MRN: [id number]  HPI  ORA MCNATT is here for a testosterone injection. Denies chest pain, shortness of breath, headaches or mood changes.   Review of Systems     Objective:   Physical Exam        Assessment & Plan:  Patient tolerated injection well without complications. Patient advised to schedule next injection 14 days from today.

## 2014-12-07 ENCOUNTER — Ambulatory Visit (INDEPENDENT_AMBULATORY_CARE_PROVIDER_SITE_OTHER): Payer: Commercial Managed Care - HMO | Admitting: Family Medicine

## 2014-12-07 ENCOUNTER — Encounter: Payer: Self-pay | Admitting: Family Medicine

## 2014-12-07 VITALS — BP 124/84 | HR 100 | Wt 301.0 lb

## 2014-12-07 DIAGNOSIS — F528 Other sexual dysfunction not due to a substance or known physiological condition: Secondary | ICD-10-CM

## 2014-12-07 DIAGNOSIS — I10 Essential (primary) hypertension: Secondary | ICD-10-CM | POA: Diagnosis not present

## 2014-12-07 DIAGNOSIS — I482 Chronic atrial fibrillation, unspecified: Secondary | ICD-10-CM

## 2014-12-07 DIAGNOSIS — E291 Testicular hypofunction: Secondary | ICD-10-CM

## 2014-12-07 DIAGNOSIS — N183 Chronic kidney disease, stage 3 unspecified: Secondary | ICD-10-CM

## 2014-12-07 LAB — COMPLETE METABOLIC PANEL WITH GFR
ALBUMIN: 3.8 g/dL (ref 3.5–5.2)
ALT: 26 U/L (ref 0–53)
AST: 21 U/L (ref 0–37)
Alkaline Phosphatase: 59 U/L (ref 39–117)
BUN: 13 mg/dL (ref 6–23)
CALCIUM: 8.7 mg/dL (ref 8.4–10.5)
CHLORIDE: 106 meq/L (ref 96–112)
CO2: 26 mEq/L (ref 19–32)
CREATININE: 0.97 mg/dL (ref 0.50–1.35)
GFR, Est African American: >89 mL/min
GFR, Est Non African American: 82 mL/min
Glucose, Bld: 112 mg/dL — ABNORMAL HIGH (ref 70–99)
Potassium: 4.1 mEq/L (ref 3.5–5.3)
Sodium: 140 mEq/L (ref 135–145)
Total Bilirubin: 3.7 mg/dL — ABNORMAL HIGH (ref 0.2–1.2)
Total Protein: 6.4 g/dL (ref 6.0–8.3)

## 2014-12-07 MED ORDER — TESTOSTERONE CYPIONATE 200 MG/ML IM SOLN
200.0000 mg | Freq: Once | INTRAMUSCULAR | Status: AC
  Administered 2014-12-07: 200 mg via INTRAMUSCULAR

## 2014-12-07 NOTE — Patient Instructions (Signed)
Erectile Dysfunction Erectile dysfunction is the inability to get or sustain a good enough erection to have sexual intercourse. Erectile dysfunction may involve:  Inability to get an erection.  Lack of enough hardness to allow penetration.  Loss of the erection before sex is finished.  Premature ejaculation. CAUSES  Certain drugs, such as:  Pain relievers.  Antihistamines.  Antidepressants.  Blood pressure medicines.  Water pills (diuretics).  Ulcer medicines.  Muscle relaxants.  Illegal drugs.  Excessive drinking.  Psychological causes, such as:  Anxiety.  Depression.  Sadness.  Exhaustion.  Performance fear.  Stress.  Physical causes, such as:  Artery problems. This may include diabetes, smoking, liver disease, or atherosclerosis.  High blood pressure.  Hormonal problems, such as low testosterone.  Obesity.  Nerve problems. This may include back or pelvic injuries, diabetes mellitus, multiple sclerosis, or Parkinson disease. SYMPTOMS  Inability to get an erection.  Lack of enough hardness to allow penetration.  Loss of the erection before sex is finished.  Premature ejaculation.  Normal erections at some times, but with frequent unsatisfactory episodes.  Orgasms that are not satisfactory in sensation or frequency.  Low sexual satisfaction in either partner because of erection problems.  A curved penis occurring with erection. The curve may cause pain or may be too curved to allow for intercourse.  Never having nighttime erections. DIAGNOSIS Your caregiver can often diagnose this condition by:  Performing a physical exam to find other diseases or specific problems with the penis.  Asking you detailed questions about the problem.  Performing blood tests to check for diabetes mellitus or to measure hormone levels.  Performing urine tests to find other underlying health conditions.  Performing an ultrasound exam to check for  scarring.  Performing a test to check blood flow to the penis.  Doing a sleep study at home to measure nighttime erections. TREATMENT   You may be prescribed medicines by mouth.  You may be given medicine injections into the penis.  You may be prescribed a vacuum pump with a ring.  Penile implant surgery may be performed. You may receive:  An inflatable implant.  A semirigid implant.  Blood vessel surgery may be performed. HOME CARE INSTRUCTIONS  If you are prescribed oral medicine, you should take the medicine as prescribed. Do not increase the dosage without first discussing it with your physician.  If you are using self-injections, be careful to avoid any veins that are on the surface of the penis. Apply pressure to the injection site for 5 minutes.  If you are using a vacuum pump, make sure you have read the instructions before using it. Discuss any questions with your physician before taking the pump home. SEEK MEDICAL CARE IF:  You experience pain that is not responsive to the pain medicine you have been prescribed.  You experience nausea or vomiting. SEEK IMMEDIATE MEDICAL CARE IF:   When taking oral or injectable medications, you experience an erection that lasts longer than 4 hours. If your physician is unavailable, go to the nearest emergency room for evaluation. An erection that lasts much longer than 4 hours can result in permanent damage to your penis.  You have pain that is severe.  You develop redness, severe pain, or severe swelling of your penis.  You have redness spreading up into your groin or lower abdomen.  You are unable to pass your urine. Document Released: 11/16/2000 Document Revised: 07/22/2013 Document Reviewed: 04/23/2013 [facility] Patient Information 2015 [place], [place]. This information is not  intended to replace advice given to you by your health care provider. Make sure you discuss any questions you have with your health care provider.

## 2014-12-07 NOTE — Progress Notes (Signed)
   Subjective:    Patient ID: Jerome Hicks, male    DOB: [birth year], 64 y.o.   MRN: [id number]  HPI Hypertension- Pt denies chest pain, SOB, dizziness, or heart palpitations.  Taking meds as directed w/o problems.  Denies medication side effects.    F/U afib - On pradaxa and doing well. No easy bruising or bleeding. He denies any swelling. No shortness of breath. No chest pain.  CKD 3 - left kidney check was 6 months ago. Due to repeat today. Denies any changes in urination pattern.  Hypogonadism - he does feel he has more energy and helps with his sleep.  He feels that it hasn't his mood as well. It has not helped with ED.   ED - he says he has tried the Cialis 20 mg and the Viagra 100 mg and doesn't get any response at all. He also says the testosterone supplementation has not helped his erectile dysfunction either. Review of Systems     Objective:   Physical Exam  Constitutional: He is oriented to person, place, and time. He appears well-developed and well-nourished.  HENT:  Head: Normocephalic and atraumatic.  Cardiovascular: Normal rate, regular rhythm and normal heart sounds.   Pulmonary/Chest: Effort normal and breath sounds normal.  Neurological: He is alert and oriented to person, place, and time.  Skin: Skin is warm and dry.  Psychiatric: He has a normal mood and affect. His behavior is normal.          Assessment & Plan:  HTN- well-controlled on current regimen. Follow-up in 6 months.  atrial fibrillation-stable. Doing well on Pradaxa. No chest pain shortness of breath. No lower extremity swelling.  CKD 3-due to repeat BUN/creatinine today. Discussed that we need to check this every 4-6 months regularly.   Hypogonadism-discussed the pros and cons of continued testosterone therapy. He does feel the benefits and would like to continue it. PSA is up-to-date.  Erectile dysfunction-he is not getting any benefit from the ED drugs. Recommend referral to urology for  further evaluation and to discuss potential treatment options. Suspected it is, nation of factors including long-standing hypertension, medications, age etc.

## 2014-12-14 ENCOUNTER — Other Ambulatory Visit: Payer: Self-pay | Admitting: Family Medicine

## 2014-12-21 ENCOUNTER — Other Ambulatory Visit: Payer: Self-pay | Admitting: *Deleted

## 2014-12-21 ENCOUNTER — Ambulatory Visit (INDEPENDENT_AMBULATORY_CARE_PROVIDER_SITE_OTHER): Payer: Commercial Managed Care - HMO | Admitting: Family Medicine

## 2014-12-21 VITALS — BP 132/81 | HR 85 | Wt 293.0 lb

## 2014-12-21 DIAGNOSIS — E291 Testicular hypofunction: Secondary | ICD-10-CM

## 2014-12-21 MED ORDER — TRAMADOL HCL 50 MG PO TABS: 50.0000 mg | ORAL_TABLET | Freq: Three times a day (TID) | ORAL | Status: DC | PRN

## 2014-12-21 MED ORDER — TESTOSTERONE CYPIONATE 200 MG/ML IM SOLN
200.0000 mg | Freq: Once | INTRAMUSCULAR | Status: AC
  Administered 2014-12-21: 200 mg via INTRAMUSCULAR

## 2014-12-21 NOTE — Progress Notes (Signed)
   Subjective:    Patient ID: Jerome Hicks, male    DOB: 1951/01/31, 64 y.o.   MRN: [id number]  HPI  Jerome Hicks is here for a testosterone injection. Denies chest pain, shortness of breath, headaches or mood changes.   Review of Systems     Objective:   Physical Exam        Assessment & Plan:  Patient tolerated injection well without complications. Patient advised to schedule next injection 14 days from today.

## 2014-12-23 ENCOUNTER — Other Ambulatory Visit: Payer: Self-pay | Admitting: *Deleted

## 2014-12-23 MED ORDER — POTASSIUM CHLORIDE CRYS ER 10 MEQ PO TBCR: EXTENDED_RELEASE_TABLET | ORAL | Status: DC

## 2014-12-31 ENCOUNTER — Telehealth: Payer: Self-pay | Admitting: Family Medicine

## 2014-12-31 NOTE — Telephone Encounter (Signed)
Refills were sent to [facility] on 12/23/2014. I called patient and he states he received his medications today.

## 2014-12-31 NOTE — Telephone Encounter (Signed)
Patient states that his prescription for Potassium needs to be sent in to the pharmacy and he's been out for 2 weeks and he also needs his Pradaxa refilled. Call patient and let him know the status on this please. Thanks, [employer]

## 2015-01-04 ENCOUNTER — Ambulatory Visit (INDEPENDENT_AMBULATORY_CARE_PROVIDER_SITE_OTHER): Payer: Commercial Managed Care - HMO | Admitting: Family Medicine

## 2015-01-04 VITALS — BP 130/80 | HR 92 | Resp 16 | Wt 303.0 lb

## 2015-01-04 DIAGNOSIS — E291 Testicular hypofunction: Secondary | ICD-10-CM

## 2015-01-04 MED ORDER — TESTOSTERONE CYPIONATE 200 MG/ML IM SOLN
200.0000 mg | INTRAMUSCULAR | Status: DC
  Administered 2015-01-04: 200 mg via INTRAMUSCULAR

## 2015-01-04 NOTE — Progress Notes (Signed)
   Subjective:    Patient ID: Jerome Hicks, male    DOB: 1951/04/02, 64 y.o.   MRN: [id number]  HPI Patient is here for a testosterone injection. Denies chest pain, shortness of breath, headaches or mood changes.     Review of Systems     Objective:   Physical Exam        Assessment & Plan:  Patient tolerated injection well without complications. Patient advised to schedule his next injection for 2 weeks from today.

## 2015-01-13 DIAGNOSIS — I8312 Varicose veins of left lower extremity with inflammation: Secondary | ICD-10-CM | POA: Diagnosis not present

## 2015-01-13 DIAGNOSIS — I872 Venous insufficiency (chronic) (peripheral): Secondary | ICD-10-CM | POA: Diagnosis not present

## 2015-01-13 DIAGNOSIS — I8311 Varicose veins of right lower extremity with inflammation: Secondary | ICD-10-CM | POA: Diagnosis not present

## 2015-01-19 ENCOUNTER — Ambulatory Visit (INDEPENDENT_AMBULATORY_CARE_PROVIDER_SITE_OTHER): Payer: Commercial Managed Care - HMO | Admitting: Family Medicine

## 2015-01-19 VITALS — BP 124/84 | HR 95 | Resp 16 | Wt 300.0 lb

## 2015-01-19 DIAGNOSIS — E291 Testicular hypofunction: Secondary | ICD-10-CM

## 2015-01-19 MED ORDER — TESTOSTERONE CYPIONATE 200 MG/ML IM SOLN
200.0000 mg | Freq: Once | INTRAMUSCULAR | Status: AC
  Administered 2015-01-19: 200 mg via INTRAMUSCULAR

## 2015-01-19 NOTE — Progress Notes (Signed)
   Subjective:    Patient ID: Jerome Hicks, male    DOB: 18-Nov-1951, 64 y.o.   MRN: [id number]  HPIPatient here for testosterone injection. Denies chest pain, shortness of breath, headaches, or severe mood changes.    Review of Systems     Objective:   Physical Exam        Assessment & Plan:  Patient tolerated injection well without complications. Patient advised to make next injection appointment for 2 weeks from today.

## 2015-02-02 ENCOUNTER — Ambulatory Visit (INDEPENDENT_AMBULATORY_CARE_PROVIDER_SITE_OTHER): Payer: Commercial Managed Care - HMO | Admitting: Family Medicine

## 2015-02-02 VITALS — BP 147/92 | HR 76

## 2015-02-02 DIAGNOSIS — E291 Testicular hypofunction: Secondary | ICD-10-CM | POA: Diagnosis not present

## 2015-02-02 MED ORDER — DABIGATRAN ETEXILATE MESYLATE 150 MG PO CAPS: 150.0000 mg | ORAL_CAPSULE | Freq: Two times a day (BID) | ORAL | Status: DC

## 2015-02-02 MED ORDER — TESTOSTERONE CYPIONATE 200 MG/ML IM SOLN
200.0000 mg | Freq: Once | INTRAMUSCULAR | Status: AC
  Administered 2015-02-02: 200 mg via INTRAMUSCULAR

## 2015-02-02 NOTE — Progress Notes (Signed)
   Subjective:    Patient ID: Jerome Hicks, male    DOB: November 09, 1951, 64 y.o.   MRN: [id number]  HPI  IWAO SHAMBLIN is here for a testosterone injection. Denies chest pain, shortness of breath, headaches or mood changes.   Review of Systems     Objective:   Physical Exam        Assessment & Plan:  Patient tolerated injection well without complications. Patient advised to schedule next injection 14 days from today.

## 2015-02-16 ENCOUNTER — Ambulatory Visit (INDEPENDENT_AMBULATORY_CARE_PROVIDER_SITE_OTHER): Payer: Commercial Managed Care - HMO | Admitting: Family Medicine

## 2015-02-16 VITALS — BP 134/84 | HR 75 | Wt 295.0 lb

## 2015-02-16 DIAGNOSIS — E291 Testicular hypofunction: Secondary | ICD-10-CM

## 2015-02-16 MED ORDER — TESTOSTERONE CYPIONATE 200 MG/ML IM SOLN
200.0000 mg | Freq: Once | INTRAMUSCULAR | Status: AC
  Administered 2015-02-16: 200 mg via INTRAMUSCULAR

## 2015-02-16 NOTE — Progress Notes (Signed)
   Subjective:    Patient ID: Jerome Hicks, male    DOB: [birth year], 64 y.o.   MRN: [id number]  HPI  Jerome Hicks is here for a testosterone injection.   Review of Systems     Objective:   Physical Exam        Assessment & Plan:  Patient tolerated injection well without complications. Patient advised to schedule next injection 14 days from today.

## 2015-02-18 ENCOUNTER — Telehealth: Payer: Self-pay

## 2015-02-18 NOTE — Telephone Encounter (Signed)
Jerome Hicks stated he has not received the Pradaxa from [facility] mail order pharmacy. I called [employer] and the problem is he has to pay for his cost and that is $ 500. I tried to call patient, no answer. He did mention the other day that he could not afford the medication if the cost was $500. Please advise.

## 2015-02-18 NOTE — Telephone Encounter (Signed)
[employer] was unable to tell me why the cost was $ 500 dollars. Arnell states she just cannot pay out $ 500.

## 2015-02-18 NOTE — Telephone Encounter (Signed)
Does he have a deductible? Or is it just the fact that this particular anticoagulant is going up in price?

## 2015-02-21 MED ORDER — APIXABAN 5 MG PO TABS: 5.0000 mg | ORAL_TABLET | Freq: Two times a day (BID) | ORAL | Status: DC

## 2015-02-21 NOTE — Telephone Encounter (Signed)
I sent a 30 day supply of Eliquis to [facility] so we could check the price.  Let call and see how much it will be. If much cheaper then he can switch from pradaxa to Eliquis and we can then send to [facility].

## 2015-02-22 ENCOUNTER — Other Ambulatory Visit: Payer: Self-pay | Admitting: *Deleted

## 2015-02-22 MED ORDER — APIXABAN 5 MG PO TABS: 5.0000 mg | ORAL_TABLET | Freq: Two times a day (BID) | ORAL | Status: DC

## 2015-02-22 NOTE — Telephone Encounter (Signed)
Pt informed.Jerome Hicks [name]  

## 2015-03-02 ENCOUNTER — Ambulatory Visit: Payer: Commercial Managed Care - HMO

## 2015-03-08 ENCOUNTER — Ambulatory Visit (INDEPENDENT_AMBULATORY_CARE_PROVIDER_SITE_OTHER): Payer: Commercial Managed Care - HMO | Admitting: Physician Assistant

## 2015-03-08 VITALS — BP 108/66 | HR 80

## 2015-03-08 DIAGNOSIS — E291 Testicular hypofunction: Secondary | ICD-10-CM | POA: Diagnosis not present

## 2015-03-08 MED ORDER — TESTOSTERONE CYPIONATE 200 MG/ML IM SOLN
200.0000 mg | Freq: Once | INTRAMUSCULAR | Status: AC
  Administered 2015-03-08: 200 mg via INTRAMUSCULAR

## 2015-03-08 NOTE — Progress Notes (Signed)
   Subjective:    Patient ID: Jerome Hicks, male    DOB: 1951-06-18, 64 y.o.   MRN: [id number]  HPI Patient came into office today for testosterone injection. Denies chest pain, shortness of breath, headaches and problems associated with taking this medication. Patient states he has had no abnornal mood swings.    Review of Systems     Objective:   Physical Exam        Assessment & Plan:  Patient tolerated injection in LUOQ well without complications. Patient advised to schedule his next injection for 2 weeks from today.

## 2015-03-15 ENCOUNTER — Telehealth: Payer: Self-pay | Admitting: Family Medicine

## 2015-03-15 ENCOUNTER — Other Ambulatory Visit: Payer: Self-pay | Admitting: *Deleted

## 2015-03-15 NOTE — Telephone Encounter (Signed)
Patient called and is requesting Jerome Hicks call him as soon as possible today about his meds? [phone]

## 2015-03-15 NOTE — Telephone Encounter (Signed)
Pt stated that a fax was sent from his insurance company about the Eliquis (PA). I told him that I would check with either Amber or Kelsie. He also stated that they told him that he was out of his meds and that we needed to send refills. I informed him that they should be sending the request for his refills because this makes it easier for [place] to know exactly what he needs. I will check on the meds that are to be refilled and send them to his pharmacy .Audelia Hives [place]

## 2015-03-17 ENCOUNTER — Telehealth: Payer: Self-pay | Admitting: Family Medicine

## 2015-03-17 NOTE — Telephone Encounter (Signed)
Received fax from [facility] for Pa on eliquis filled out form, had Dr. Madilyn Fireman sign it and faxed it back waiting on auth. - CF

## 2015-03-21 NOTE — Telephone Encounter (Signed)
Received fax from [facility] and got approval for tier exception on Eliquis 5mg  tablets.

## 2015-03-23 ENCOUNTER — Ambulatory Visit (INDEPENDENT_AMBULATORY_CARE_PROVIDER_SITE_OTHER): Payer: Commercial Managed Care - HMO | Admitting: Family Medicine

## 2015-03-23 ENCOUNTER — Other Ambulatory Visit: Payer: Self-pay | Admitting: *Deleted

## 2015-03-23 DIAGNOSIS — E291 Testicular hypofunction: Secondary | ICD-10-CM | POA: Diagnosis not present

## 2015-03-23 MED ORDER — DOXAZOSIN MESYLATE 4 MG PO TABS: 4.0000 mg | ORAL_TABLET | Freq: Every day | ORAL | Status: DC

## 2015-03-23 MED ORDER — LISINOPRIL 10 MG PO TABS: 10.0000 mg | ORAL_TABLET | Freq: Every day | ORAL | Status: DC

## 2015-03-23 MED ORDER — TESTOSTERONE CYPIONATE 200 MG/ML IM SOLN
200.0000 mg | Freq: Once | INTRAMUSCULAR | Status: AC
  Administered 2015-03-23: 200 mg via INTRAMUSCULAR

## 2015-03-23 NOTE — Progress Notes (Signed)
   Subjective:    Patient ID: Jerome Hicks, male    DOB: [birth year], 64 y.o.   MRN: [id number]  HPI  Jerome Hicks is here for a testosterone injection. Denies chest pain, shortness of breath, headaches or mood changes.   Review of Systems     Objective:   Physical Exam        Assessment & Plan:  Patient tolerated injection well without complications. Patient advised to schedule next injection 14 days from today.

## 2015-04-07 ENCOUNTER — Ambulatory Visit: Payer: Commercial Managed Care - HMO | Admitting: Family Medicine

## 2015-04-11 ENCOUNTER — Ambulatory Visit (INDEPENDENT_AMBULATORY_CARE_PROVIDER_SITE_OTHER): Payer: Commercial Managed Care - HMO | Admitting: Family Medicine

## 2015-04-11 ENCOUNTER — Encounter: Payer: Self-pay | Admitting: Family Medicine

## 2015-04-11 VITALS — BP 121/85 | HR 80 | Wt 294.0 lb

## 2015-04-11 DIAGNOSIS — E291 Testicular hypofunction: Secondary | ICD-10-CM | POA: Diagnosis not present

## 2015-04-11 DIAGNOSIS — I482 Chronic atrial fibrillation, unspecified: Secondary | ICD-10-CM

## 2015-04-11 DIAGNOSIS — I10 Essential (primary) hypertension: Secondary | ICD-10-CM

## 2015-04-11 DIAGNOSIS — M7501 Adhesive capsulitis of right shoulder: Secondary | ICD-10-CM

## 2015-04-11 DIAGNOSIS — I872 Venous insufficiency (chronic) (peripheral): Secondary | ICD-10-CM

## 2015-04-11 MED ORDER — TESTOSTERONE CYPIONATE 200 MG/ML IM SOLN: 200.0000 mg | Freq: Once | INTRAMUSCULAR | Status: DC

## 2015-04-11 NOTE — Patient Instructions (Signed)
Adhesive Capsulitis  Sometimes the shoulder becomes stiff and is painful to move. Some people say it feels as if the shoulder is frozen in place. Because of this, the condition is called "frozen shoulder." Its medical name is adhesive capsulitis.   The shoulder joint is made up of strong connective tissue that attaches the ball of the humerus to the shallow shoulder socket. This strong connective tissue is called the joint capsule. This tissue can become stiff and swollen. That is when adhesive capsulitis sets in.  CAUSES   It is not always clear just what the cause adhesive capsulitis. Possibilities include:  · Injury to the shoulder joint.  · Strain. This is a repetitive injury brought about by overuse.  · Lack of use. Perhaps your arm or hand was otherwise injured. It might have been in a sling for awhile. Or perhaps you were not using it to avoid pain.  · Referred pain. This is a sort of trick the body plays. You feel pain in the shoulder. But, the pain actually comes from an injury somewhere else in the body.  · Long-standing health problems. Several diseases can cause adhesive capsulitis. They include diabetes, heart disease, stroke, thyroid problems, rheumatoid arthritis and lung disease.  · Being a women older than 40. Anyone can develop adhesive capsulitis but it is most common in women in this age group.  SYMPTOMS   · Pain.  ¨ It occurs when the arm is moved.  ¨ Parts of the shoulder might hurt if they are touched.  ¨ Pain is worse at night or when resting.  · Soreness. It might not be strong enough to be called pain. But, the shoulder aches.  · The shoulder does not move freely.  · Muscle spasms.  · Trouble sleeping because of shoulder ache or pain.  DIAGNOSIS   To decide if you have adhesive capsulitis, your healthcare provider will probably:  · Ask about symptoms you have noticed.  · Ask about your history of joint pain and anything that might have caused the pain.  · Ask about your overall  health.  · Use hands to feel your shoulder and neck.  · Ask you to move your shoulder in specific directions. This may indicate the origin of the pain.  · Order imaging tests; pictures of the shoulder. They help pinpoint the source of the problem. An X-ray might be used. For more detail, an MRI is often used. An MRI details the tendons, muscles and ligaments as well as the joint.  TREATMENT   Adhesive capsulitis can be treated several ways. Most treatments can be done in a clinic or in your healthcare provider's office. Be sure to discuss the different options with your caregiver. They include:  · Physical therapy. You will work on specific exercises to get your shoulder moving again. The exercises usually involve stretching. A physical therapist (a caregiver with special training) can show you what to do and what not to do. The exercises will need to be done daily.  · Medication.  ¨ Over-the-counter medicines may relieve pain and inflammation (the body's way of reacting to injury or infection).  ¨ Corticosteroids. These are stronger drugs to reduce pain and inflammation. They are given by injection (shots) into the shoulder joint. Frequent treatment is not recommended.  ¨ Muscle relaxants. Medication may be prescribed to ease muscle spasms.  · Treatment of underlying conditions. This means treating another condition that is causing your shoulder problem. This might be a rotator   cuff (tendon) problem  · Shoulder manipulation. The shoulder will be moved by your healthcare provider. You would be under general anesthesia (given a drug that puts you to sleep). You would not feel anything. Sometimes the joint will be injected with salt water (saline) at high pressure to break down internal scarring in the joint capsule.  · Surgery. This is rarely needed. It may be suggested in advanced cases after all other treatment has failed.  PROGNOSIS   In time, most people recover from adhesive capsulitis. Sometimes, however, the  pain goes away but full movement of the shoulder does not return.   HOME CARE INSTRUCTIONS   · Take any pain medications recommended by your healthcare provider. Follow the directions carefully.  · If you have physical therapy, follow through with the therapist's suggestions. Be sure you understand the exercises you will be doing. You should understand:  ¨ How often the exercises should be done.  ¨ How many times each exercise should be repeated.  ¨ How long they should be done.  ¨ What other activities you should do, or not do.  ¨ That you should warm up before doing any exercise. Just 5 to 10 minutes will help. Small, gentle movements should get your shoulder ready for more.  · Avoid high-demand exercise that involves your shoulder such as throwing. This type of exercise can make pain worse.  · Consider using cold packs. Cold may ease swelling and pain. Ask your healthcare provider if a cold pack might help you. If so, get directions on how and when to use them.  SEEK MEDICAL CARE IF:   · You have any questions about your medications.  · Your pain continues to increase.  Document Released: 09/16/2009 Document Revised: 02/11/2012 Document Reviewed: 09/16/2009  ExitCare® Patient Information ©2015 [employer]. This information is not intended to replace advice given to you by your health care provider. Make sure you discuss any questions you have with your health care provider.

## 2015-04-11 NOTE — Progress Notes (Signed)
Patient ID: Jerome Hicks, male   DOB: 1951/09/01, 64 y.o.   MRN: [id number]  Patient came in for f/u visit also received his testosterone injection. Patient tolerated injection well RUOQ no complaints. Will make appointment for next 2 weeks.

## 2015-04-11 NOTE — Progress Notes (Signed)
   Subjective:    Patient ID: Jerome Hicks, male    DOB: 07/06/1951, 64 y.o.   MRN: [id number]  HPI Hypertension- Pt denies chest pain, SOB, dizziness, or heart palpitations.  Taking meds as directed w/o problems.  Denies medication side effects.    2 months of difficulty raising the right shoulder . No pain or injury that he remembers. He says he is only able to raise it to about 90. No worsening or alleviating factors.   Afib- taking Eliquis daily without any side effects or palms. No easy bruising or bleeding. He wears compression stockings daily. He's not within today so I could see his legs. He feels like his swelling has been well controlled.  Hypogonadism-doing well with testosterone injections. He's been getting them every 2 weeks. No change in mood. He does feel like it helps with muscle strength and energy level.  Review of Systems     Objective:   Physical Exam  Constitutional: He is oriented to person, place, and time. He appears well-developed and well-nourished.  HENT:  Head: Normocephalic and atraumatic.  Cardiovascular: Normal rate, regular rhythm and normal heart sounds.   Pulmonary/Chest: Effort normal and breath sounds normal.  Musculoskeletal:  Right shoulder is tender just above the before meals joint. Nontender over the before meals joint, clavicle or scapula. He is able to raise his arm to about 110. He is unable to reach his low back with internal rotation. Empty can test is normal. Strength with flexion extension at the shoulder elbow and wrist are 5 out of 5.  Neurological: He is alert and oriented to person, place, and time.  Skin: Skin is warm and dry.  1+ pitting edema on the right lower extremity and trace pitting edema from the knee down on the left.  Psychiatric: He has a normal mood and affect. His behavior is normal.          Assessment & Plan:  HTN- Well controlled.  Continue current regimen.  Right frozen shoulder - Refer to sport med, Dr.  Darene Lamer for further treatment options.  He is not in any pain.   Afib - doing well on Eliquis.  Continue current regimen.  Hypogonadism-given testosterone injection today.  Venous stasis-stable. Continue compression stockings.

## 2015-04-14 ENCOUNTER — Ambulatory Visit (INDEPENDENT_AMBULATORY_CARE_PROVIDER_SITE_OTHER): Payer: Commercial Managed Care - HMO

## 2015-04-14 ENCOUNTER — Ambulatory Visit (INDEPENDENT_AMBULATORY_CARE_PROVIDER_SITE_OTHER): Payer: Commercial Managed Care - HMO | Admitting: Sports Medicine

## 2015-04-14 ENCOUNTER — Encounter: Payer: Self-pay | Admitting: Sports Medicine

## 2015-04-14 VITALS — BP 117/74 | HR 95 | Ht 75.0 in | Wt 289.0 lb

## 2015-04-14 DIAGNOSIS — M19011 Primary osteoarthritis, right shoulder: Secondary | ICD-10-CM | POA: Diagnosis not present

## 2015-04-14 DIAGNOSIS — M25511 Pain in right shoulder: Secondary | ICD-10-CM

## 2015-04-14 DIAGNOSIS — M19019 Primary osteoarthritis, unspecified shoulder: Secondary | ICD-10-CM | POA: Insufficient documentation

## 2015-04-14 NOTE — Assessment & Plan Note (Signed)
Most likely impingement related, his motion is more than expected for adhesive capsulitis. Subacromial injection today, he is unable to sleep with his pain. He does have some symptoms referable to the glenohumeral joint, so we performed an additional injection into the glenohumeral joint. Formal physical therapy, x-rays, unable to use NSAIDs as he is on a blood thinner may use tylenol, return to see me in 4-6 weeks.

## 2015-04-14 NOTE — Progress Notes (Signed)
   Subjective:    I'm seeing this patient as a consultation for:  Dr. Madilyn Fireman  CC: Right shoulder pain  HPI: Jerome Hicks is a pleasant 64 year old male who comes in with a several week history of increasing pain over the deltoid of his right shoulder, worse with overhead activities, no trauma. He does tend to carry large logs overhead. Symptoms are severe, persistent and are keeping him from sleeping.  Past medical history, Surgical history, Family history not pertinant except as noted below, Social history, Allergies, and medications have been entered into the medical record, reviewed, and no changes needed.   Review of Systems: No headache, visual changes, nausea, vomiting, diarrhea, constipation, dizziness, abdominal pain, skin rash, fevers, chills, night sweats, weight loss, swollen lymph nodes, body aches, joint swelling, muscle aches, chest pain, shortness of breath, mood changes, visual or auditory hallucinations.   Objective:   General: Well Developed, well nourished, and in no acute distress.  Neuro/Psych: Alert and oriented x3, extra-ocular muscles intact, able to move all 4 extremities, sensation grossly intact. Skin: Warm and dry, no rashes noted.  Respiratory: Not using accessory muscles, speaking in full sentences, trachea midline.  Cardiovascular: Pulses palpable, no extremity edema. Abdomen: Does not appear distended. Right Shoulder: Inspection reveals no abnormalities, atrophy or asymmetry. Palpation is normal with no tenderness over AC joint or bicipital groove. ROM is full in all planes. Rotator cuff strength normal throughout. positive Neer and Hawkin's tests, empty can. Speeds and Yergason's tests normal. No labral pathology noted with negative Obrien's, positive crank, negative clunk, and good stability. Normal scapular function observed. No painful arc and no drop arm sign. No apprehension sign.  Procedure: Real-time Ultrasound Guided Injection of right subacromial  bursa Device: GE Logiq E  Verbal informed consent obtained.  Time-out conducted.  Noted no overlying erythema, induration, or other signs of local infection.  Skin prepped in a sterile fashion.  Local anesthesia: Topical Ethyl chloride.  With sterile technique and under real time ultrasound guidance:  1 mL kenalog 40, 4 mL lidocaine injected over an intact appearing supraspinatus Completed without difficulty  Pain immediately resolved suggesting accurate placement of the medication.  Advised to call if fevers/chills, erythema, induration, drainage, or persistent bleeding.  Images permanently stored and available for review in the ultrasound unit.  Impression: Technically successful ultrasound guided injection.  Procedure: Real-time Ultrasound Guided Injection of right glenohumeral joint Device: GE Logiq E  Verbal informed consent obtained.  Time-out conducted.  Noted no overlying erythema, induration, or other signs of local infection.  Skin prepped in a sterile fashion.  Local anesthesia: Topical Ethyl chloride.  With sterile technique and under real time ultrasound guidance:  Spinal needle advanced into the glenohumeral joint taking care to avoid the labrum, 1 mL kenalog 40, 4 mL lidocaine injected easily. Completed without difficulty  Pain immediately resolved suggesting accurate placement of the medication.  Advised to call if fevers/chills, erythema, induration, drainage, or persistent bleeding.  Images permanently stored and available for review in the ultrasound unit.  Impression: Technically successful ultrasound guided injection.  X-rays are reviewed and show glenohumeral and acromioclavicular degenerative changes.  Impression and Recommendations:   This case required medical decision making of moderate complexity.

## 2015-04-18 ENCOUNTER — Ambulatory Visit (INDEPENDENT_AMBULATORY_CARE_PROVIDER_SITE_OTHER): Payer: Commercial Managed Care - HMO | Admitting: Physical Therapy

## 2015-04-18 ENCOUNTER — Encounter: Payer: Self-pay | Admitting: Physical Therapy

## 2015-04-18 DIAGNOSIS — M25511 Pain in right shoulder: Secondary | ICD-10-CM

## 2015-04-18 DIAGNOSIS — R29898 Other symptoms and signs involving the musculoskeletal system: Secondary | ICD-10-CM | POA: Diagnosis not present

## 2015-04-18 DIAGNOSIS — M25611 Stiffness of right shoulder, not elsewhere classified: Secondary | ICD-10-CM | POA: Diagnosis not present

## 2015-04-18 NOTE — Therapy (Signed)
[place], [place], [address] Phone: [phone]   Fax:  [phone]  Physical Therapy Evaluation  Patient Details  Name: Jerome Hicks MRN: [id number] Date of Birth: 1951/05/13 Referring Provider:  Silverio Decamp,*  Encounter Date: 04/18/2015      PT End of Session - 04/18/15 0709    Visit Number 1   Number of Visits 8   Date for PT Re-Evaluation 05/16/15   PT Start Time 0713   PT Stop Time 0754   PT Time Calculation (min) 41 min   Activity Tolerance Patient tolerated treatment well      Past Medical History  Diagnosis Date  . Kidney stones   . 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 ED (erectile dysfunction)     has been prescribed Levitra and Viagrain in the past  . Scrotal abscess 3-03  . Leg edema     recurrent cellulitis  . Internal hemorrhoids   . Sigmoid diverticulosis   . Atrial fibrillation   . Hypertension     Past Surgical History  Procedure Laterality Date  . Cholecystectomy  1971  . Kidney stones  1990's    Ureteroscopic stone extraction  .  mrsa infection in left groin requiring surgical debridement  12-08  . Tonsillectomy      There were no vitals filed for this visit.  Visit Diagnosis:  Pain in joint, shoulder region, right - Plan: PT plan of care cert/re-cert  Weakness of shoulder - Plan: PT plan of care cert/re-cert  Stiffness of shoulder joint, right - Plan: PT plan of care cert/re-cert      Subjective Assessment - 04/18/15 0713    Subjective Pt reports a couple month h/o Rt shoulder pain, had an injection last week and it feels better and is able to move better.    Patient Stated Goals split wood, move and stack it without pain.  Reach behind back.    Currently in Pain? No/denies            [facility] PT Assessment - 04/18/15 0001    Assessment   Medical Diagnosis Rt shoulder pain   Onset Date 03/19/15   Next MD Visit 5 more weeks   Precautions   Precautions None   Balance Screen   Has the patient fallen in the past 6 months No   Has the patient had a decrease in activity level because of a fear of falling?  No   Is the patient reluctant to leave their home because of a fear of falling?  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   Observation/Other Assessments   Focus on Therapeutic Outcomes (FOTO)  48% limited   Posture/Postural Control   Posture/Postural Control --   Postural Limitations Rounded Shoulders;Forward head   ROM / Strength   AROM / PROM / Strength AROM;Strength   AROM   Overall AROM  Within functional limits for tasks performed  pain with reaching overhead, behind  head and  back   AROM Assessment Site Shoulder   Right/Left Shoulder Right   Right Shoulder Internal Rotation 80 Degrees   Right Shoulder External Rotation 30 Degrees   Strength   Overall Strength Comments mid trap Rt 4+/5, Lt 5/5, low trap 4-/5 bilat.    Strength Assessment Site Shoulder   Right/Left  Shoulder Right   Right Shoulder Flexion --  5-/5   Right Shoulder ABduction --  5-/5   Right Shoulder Internal Rotation 5/5   Right Shoulder External Rotation 4+/5   Flexibility   Soft Tissue Assessment /Muscle Length --  tightness in Rt shoulder rotators                   [facility] Adult PT Treatment/Exercise - 04/18/15 0001    Exercises   Exercises Shoulder   Shoulder Exercises: Standing   External Rotation Strengthening;Right;15 reps   Theraband Level (Shoulder External Rotation) Level 2 (Red)  green difficult   Internal Rotation Strengthening;Right;15 reps   Theraband Level (Shoulder Internal Rotation) Level 3 (Green)   Flexion AROM;Right;15 reps   Shoulder Exercises: Stretch   Other Shoulder Stretches Unilateral chest stretch at doorway- unable to follow VC well. Switched to bilateral UE chest  stretch x 30 sec x 2 reps -able to return demo well.    Manual Therapy   Manual Therapy Joint mobilization   Joint Mobilization Rt shoulder grade III ER   Pt with 45 degrees ER after mobs                PT Education - 04/18/15 0802    Education provided Yes   Education Details HEP   Person(s) Educated Patient   Methods Handout;Explanation   Comprehension Verbalized understanding;Returned demonstration             PT Long Term Goals - 04/18/15 0710    PT LONG TERM GOAL #1   Title I with HEP   Time 4   Period Weeks   Status New   PT LONG TERM GOAL #2   Title increase Rt shoulder ER =/> 70 degrees to allow him to get his hand behind his head without pain   Time 4   Period Weeks   Status New   PT LONG TERM GOAL #3   Title increase strength Rt shoulder =/> 5/5 throughout   Time 4   Period Weeks   Status New   PT LONG TERM GOAL #4   Title improve FOTO =/< CJ level    Time 4   Period Weeks   Status New               Plan - 04/18/15 0746    Clinical Impression Statement 64 y/o male, very active working outside and cutting/stacking wood. Developed Rt shoulder pain and presents with impingement type symptoms.  He has limited shoulder rotation and some weakness also.  Patient should respond well to therapy   Pt will benefit from skilled therapeutic intervention in order to improve on the following deficits Impaired UE functional use;Pain;Decreased strength   Rehab Potential Excellent   PT Frequency 2x / week   PT Duration 4 weeks   PT Treatment/Interventions Patient/family education;Moist Heat;Cryotherapy;Electrical Stimulation;Manual techniques;Ultrasound;Therapeutic exercise   PT Next Visit Plan Rt shoulder mob for rotation, RTC strengthening   Consulted and Agree with Plan of Care Patient         Problem List Patient Active Problem List   Diagnosis Date Noted  . Right shoulder pain 04/14/2015  . 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 Hypogonadism male  02/26/2014  . Lower leg edema 12/02/2013  . Acute renal failure 12/02/2013  . Lymph node enlargement 12/02/2013  . CKD (chronic kidney disease) stage 3, GFR 30-59 ml/min 05/28/2013  . Atrial fibrillation 06/25/2012  . BMI 36.0-36.9,adult 06/19/2012  . 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  RESTRICTIVE LUNG DISEASE 01/12/2011  . ERECTILE DYSFUNCTION 01/06/2008  . ESSENTIAL HYPERTENSION, BENIGN 01/05/2008  . Venous (peripheral) insufficiency 01/05/2008    Jeral Pinch, PT 04/18/2015, 8:02 AM  [facility] [place], [place], [address] Phone: [phone]   Fax:  [phone]

## 2015-04-18 NOTE — Patient Instructions (Addendum)
  Rotation: External (Single Arm)   Side toward anchor in shoulder width stance with elbow bent to 90, arm across mid-section. Thumb up, pull arm away from body, keeping elbow bent. Repeat _10_ times per set. Repeat with other arm. Do _2-3_ sets per session. Do _3_ sessions per week. Anchor Height: Waist    Rotation: Internal (Single Arm)   Side toward anchor in shoulder width stance with elbow bent to 90, forearm away from body. Thumb up, pull arm across body keeping elbow bent. Repeat _10_ times per set. Repeat with other arm. Do _2-3_ sets per session. Do _3_ sessions per week. Anchor Height: Waist  Active ROM Flexion   One arm straight, at side of chair, make fist, thumb up. Then slowly raise arm to 90 degrees. Attempt to keep shoulder blades together. Hold _0__ seconds. Repeat _10__ times Right arm. Do _2__ sessions per day.   CHEST: Doorway, Bilateral - Standing   Standing in doorway, place hands on wall with elbows bent at shoulder height. Lean forward. Hold ___ seconds. ___ reps per set, ___ sets per day, ___ days per week  Copyright  VHI. All rights reserved.     [facility] [place], [place] [address]  [phone] (office) [phone] (fax)  Copyright  VHI. All rights reserved.

## 2015-04-20 ENCOUNTER — Ambulatory Visit (INDEPENDENT_AMBULATORY_CARE_PROVIDER_SITE_OTHER): Payer: Commercial Managed Care - HMO | Admitting: Physical Therapy

## 2015-04-20 ENCOUNTER — Encounter: Payer: Self-pay | Admitting: Physical Therapy

## 2015-04-20 DIAGNOSIS — R29898 Other symptoms and signs involving the musculoskeletal system: Secondary | ICD-10-CM | POA: Diagnosis not present

## 2015-04-20 DIAGNOSIS — M25611 Stiffness of right shoulder, not elsewhere classified: Secondary | ICD-10-CM | POA: Diagnosis not present

## 2015-04-20 NOTE — Therapy (Signed)
[place], [place], [address] Phone: [phone]   Fax:  [phone]  Physical Therapy Treatment  Patient Details  Name: Jerome Hicks MRN: [id number] Date of Birth: 01/14/1951 Referring Provider:  Silverio Decamp,*  Encounter Date: 04/20/2015      PT End of Session - 04/20/15 0729    Visit Number 2   Number of Visits 8   Date for PT Re-Evaluation 05/16/15   PT Start Time 0711   PT Stop Time 0740   PT Time Calculation (min) 29 min      Past Medical History  Diagnosis Date  . Kidney stones   . 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 ED (erectile dysfunction)     has been prescribed Levitra and Viagrain in the past  . Scrotal abscess 3-03  . Leg edema     recurrent cellulitis  . Internal hemorrhoids   . Sigmoid diverticulosis   . Atrial fibrillation   . Hypertension     Past Surgical History  Procedure Laterality Date  . Cholecystectomy  1971  . Kidney stones  1990's    Ureteroscopic stone extraction  .  mrsa infection in left groin requiring surgical debridement  12-08  . Tonsillectomy      There were no vitals filed for this visit.  Visit Diagnosis:  Weakness of shoulder  Stiffness of shoulder joint, right      Subjective Assessment - 04/20/15 0713    Subjective the hardest exercise for me was the doorway stretch, after the first 30 sec it was better   Currently in Pain? No/denies                         [facility] Adult PT Treatment/Exercise - 04/20/15 0001    Shoulder Exercises: Seated   Other Seated Exercises full can to 90 degrees with 1# wts   Shoulder Exercises: Standing   Horizontal ABduction Both;Theraband;15 reps   Theraband Level (Shoulder Horizontal ABduction) Level 4 (Blue)   Internal Rotation Strengthening;Right;15 reps;Theraband   Theraband Level (Shoulder Internal Rotation) Level 3 (Green)   Shoulder Exercises: ROM/Strengthening   UBE (Upper Arm Bike) L3x 2'/2'   Manual Therapy   Manual Therapy Joint mobilization   Joint Mobilization Rt shoulder, grade III posterior jt mobs, stretching into ER                     PT Long Term Goals - 04/18/15 0710    PT LONG TERM GOAL #1   Title I with HEP   Time 4   Period Weeks   Status New   PT LONG TERM GOAL #2   Title increase Rt shoulder ER =/> 70 degrees to allow him to get his hand behind his head without pain   Time 4   Period Weeks   Status New   PT LONG TERM GOAL #3   Title increase strength Rt shoulder =/> 5/5 throughout   Time 4   Period Weeks   Status New   PT LONG TERM GOAL #4   Title improve FOTO =/< CJ level    Time 4   Period Weeks   Status New               Plan - 04/20/15 7048    Clinical Impression Statement Pt demo improved Rt shoulder ER with manual therapy, doing well with HEP   Pt will  benefit from skilled therapeutic intervention in order to improve on the following deficits Impaired UE functional use;Pain;Decreased strength   Rehab Potential Excellent   PT Frequency 2x / week   PT Duration 4 weeks   PT Treatment/Interventions Patient/family education;Moist Heat;Cryotherapy;Electrical Stimulation;Manual techniques;Ultrasound;Therapeutic exercise   PT Next Visit Plan Rt shoulder mob for rotation, RTC strengthening   Consulted and Agree with Plan of Care Patient        Problem List Patient Active Problem List   Diagnosis Date Noted  . Right shoulder pain 04/14/2015  . 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 Hypogonadism male 02/26/2014  . Lower leg edema 12/02/2013  . Acute renal failure 12/02/2013  . Lymph node enlargement 12/02/2013  . CKD (chronic kidney disease) stage 3, GFR 30-59 ml/min 05/28/2013  . Atrial fibrillation 06/25/2012  . BMI 36.0-36.9,adult 06/19/2012  . 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 RESTRICTIVE LUNG DISEASE 01/12/2011  . ERECTILE DYSFUNCTION 01/06/2008  . ESSENTIAL HYPERTENSION, BENIGN  01/05/2008  . Venous (peripheral) insufficiency 01/05/2008    Jeral Pinch, PT 04/20/2015, 8:58 AM  [facility] [place], [place], [address] Phone: [phone]   Fax:  [phone]

## 2015-04-21 ENCOUNTER — Other Ambulatory Visit: Payer: Self-pay | Admitting: Family Medicine

## 2015-04-21 MED ORDER — APIXABAN 5 MG PO TABS: 5.0000 mg | ORAL_TABLET | Freq: Two times a day (BID) | ORAL | Status: DC

## 2015-04-21 NOTE — Telephone Encounter (Signed)
Patient called and informed the Rx for Eliquis is cheaper at [facility] than that [facility]. Sent over Rx to [facility].

## 2015-04-25 ENCOUNTER — Ambulatory Visit (INDEPENDENT_AMBULATORY_CARE_PROVIDER_SITE_OTHER): Payer: Commercial Managed Care - HMO | Admitting: Physical Therapy

## 2015-04-25 DIAGNOSIS — M25511 Pain in right shoulder: Secondary | ICD-10-CM | POA: Diagnosis not present

## 2015-04-25 DIAGNOSIS — M25611 Stiffness of right shoulder, not elsewhere classified: Secondary | ICD-10-CM | POA: Diagnosis not present

## 2015-04-25 DIAGNOSIS — R29898 Other symptoms and signs involving the musculoskeletal system: Secondary | ICD-10-CM

## 2015-04-25 NOTE — Therapy (Signed)
[place], [place], [address] Phone: [phone]   Fax:  [phone]  Physical Therapy Treatment  Patient Details  Name: Jerome Hicks MRN: [id number] Date of Birth: 05-19-1951 Referring Provider:  Silverio Decamp,*  Encounter Date: 04/25/2015      PT End of Session - 04/25/15 0703    Visit Number 3   Number of Visits 8   Date for PT Re-Evaluation 05/16/15   PT Start Time 0701   PT Stop Time 0736   PT Time Calculation (min) 35 min      Past Medical History  Diagnosis Date  . Kidney stones   . 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 ED (erectile dysfunction)     has been prescribed Levitra and Viagrain in the past  . Scrotal abscess 3-03  . Leg edema     recurrent cellulitis  . Internal hemorrhoids   . Sigmoid diverticulosis   . Atrial fibrillation   . Hypertension     Past Surgical History  Procedure Laterality Date  . Cholecystectomy  1971  . Kidney stones  1990's    Ureteroscopic stone extraction  .  mrsa infection in left groin requiring surgical debridement  12-08  . Tonsillectomy      There were no vitals filed for this visit.  Visit Diagnosis:  Stiffness of shoulder joint, right  Weakness of shoulder  Pain in joint, shoulder region, right      Subjective Assessment - 04/25/15 0703    Subjective Pt reports he was able to stack wood without difficulty. Reports he has been compliant with HEP.    Currently in Pain? No/denies            [facility] PT Assessment - 04/25/15 0001    Assessment   Medical Diagnosis Rt shoulder pain   Onset Date/Surgical Date 03/19/15   ROM / Strength   AROM / PROM / Strength AROM   AROM   AROM Assessment Site Shoulder   Right/Left Shoulder Right   Right Shoulder Internal Rotation 70 Degrees   Right Shoulder External Rotation 55 Degrees                     [facility] Adult PT Treatment/Exercise - 04/25/15 0001    Shoulder Exercises: Supine   Protraction --   Theraband Level (Shoulder Protraction) --   Horizontal ABduction Strengthening;Both;10 reps;Theraband   Theraband Level (Shoulder Horizontal ABduction) Level 4 (Blue)   Other Supine Exercises D2 flex (sash- thumb up) with red band x 10 x 2 sets with RUE.    Shoulder Exercises: Sidelying   External Rotation Strengthening;Right;10 reps;Weights  2 sets   External Rotation Weight (lbs) 3#   Shoulder Exercises: Standing   Other Standing Exercises --   Shoulder Exercises: ROM/Strengthening   UBE (Upper Arm Bike) L3x 3'/2'   Shoulder Exercises: Stretch   Other Shoulder Stretches Doorway stretch with BUE x 30 sec x 2    Other Shoulder Stretches shoulder ext stretch with towel assist    Manual Therapy   Manual Therapy Joint mobilization;Soft tissue mobilization;Myofascial release   Manual therapy comments --  Oscillations into Rt shoulder ER   Joint Mobilization Rt shoulder, grade III posterior jt mobs, stretching into ER   Myofascial Release Rt pec release                 PT Education - 04/25/15 0738    Education provided Yes  Education Details HEP - issued paper copy of scapular theraband unattached (hor abd and D2 Flex)    Person(s) Educated Patient   Methods Explanation;Handout   Comprehension Verbalized understanding;Returned demonstration             PT Long Term Goals - 04/18/15 0710    PT LONG TERM GOAL #1   Title I with HEP   Time 4   Period Weeks   Status New   PT LONG TERM GOAL #2   Title increase Rt shoulder ER =/> 70 degrees to allow him to get his hand behind his head without pain   Time 4   Period Weeks   Status New   PT LONG TERM GOAL #3   Title increase strength Rt shoulder =/> 5/5 throughout   Time 4   Period Weeks   Status New   PT LONG TERM GOAL #4   Title improve FOTO =/< CJ level    Time 4   Period Weeks   Status New               Plan - 04/25/15 1159    Clinical Impression  Statement Pt demo improved Rt shoulder ER this visit.  Pt tolerated all new exercises with minimal/no increase in pain in Rt shoulder. Making progress towards goals.    Pt will benefit from skilled therapeutic intervention in order to improve on the following deficits Impaired UE functional use;Pain;Decreased strength   Rehab Potential Excellent   PT Frequency 2x / week   PT Duration 4 weeks   PT Treatment/Interventions Patient/family education;Moist Heat;Cryotherapy;Electrical Stimulation;Manual techniques;Ultrasound;Therapeutic exercise   PT Next Visit Plan Continue Rt shoulder mob for rotation, RTC strengthening   Consulted and Agree with Plan of Care Patient        Problem List Patient Active Problem List   Diagnosis Date Noted  . Right shoulder pain 04/14/2015  . 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 Hypogonadism male 02/26/2014  . Lower leg edema 12/02/2013  . Acute renal failure 12/02/2013  . Lymph node enlargement 12/02/2013  . CKD (chronic kidney disease) stage 3, GFR 30-59 ml/min 05/28/2013  . Atrial fibrillation 06/25/2012  . BMI 36.0-36.9,adult 06/19/2012  . 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 RESTRICTIVE LUNG DISEASE 01/12/2011  . ERECTILE DYSFUNCTION 01/06/2008  . ESSENTIAL HYPERTENSION, BENIGN 01/05/2008  . Venous (peripheral) insufficiency 01/05/2008   Kerin Perna, PTA 04/25/2015 12:05 PM   [place], [place], [address] Phone: [phone]   Fax:  [phone]

## 2015-04-27 ENCOUNTER — Ambulatory Visit (INDEPENDENT_AMBULATORY_CARE_PROVIDER_SITE_OTHER): Payer: Commercial Managed Care - HMO | Admitting: Physical Therapy

## 2015-04-27 DIAGNOSIS — M25611 Stiffness of right shoulder, not elsewhere classified: Secondary | ICD-10-CM

## 2015-04-27 DIAGNOSIS — M25511 Pain in right shoulder: Secondary | ICD-10-CM

## 2015-04-27 DIAGNOSIS — R29898 Other symptoms and signs involving the musculoskeletal system: Secondary | ICD-10-CM | POA: Diagnosis not present

## 2015-04-27 NOTE — Therapy (Signed)
[place] [address] [place], [place], [address] Phone: [phone]   Fax:  [phone]  Physical Therapy Treatment  Patient Details  Name: Jerome Hicks MRN: [id number] Date of Birth: [birth year] Referring Provider:  Silverio Decamp,*  Encounter Date: 04/27/2015      PT End of Session - 04/27/15 0705    Visit Number 4   Number of Visits 8   Date for PT Re-Evaluation 05/16/15   PT Start Time 0704   PT Stop Time 3295   PT Time Calculation (min) 40 min   Activity Tolerance Patient tolerated treatment well;No increased pain      Past Medical History  Diagnosis Date  . Kidney stones   . 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 ED (erectile dysfunction)     has been prescribed Levitra and Viagrain in the past  . Scrotal abscess 3-03  . Leg edema     recurrent cellulitis  . Internal hemorrhoids   . Sigmoid diverticulosis   . Atrial fibrillation   . Hypertension     Past Surgical History  Procedure Laterality Date  . Cholecystectomy  1971  . Kidney stones  1990's    Ureteroscopic stone extraction  .  mrsa infection in left groin requiring surgical debridement  12-08  . Tonsillectomy      There were no vitals filed for this visit.  Visit Diagnosis:  Stiffness of shoulder joint, right  Weakness of shoulder  Pain in joint, shoulder region, right      Subjective Assessment - 04/27/15 0707    Subjective "[place] you're doing, I think its working"    Currently in Pain? No/denies            [facility] PT Assessment - 04/27/15 0001    Assessment   Medical Diagnosis Rt shoulder pain   Onset Date/Surgical Date 03/19/15   AROM   AROM Assessment Site Shoulder   Right/Left Shoulder Right   Right Shoulder External Rotation 62 Degrees  after manual therapy                     [facility] Adult PT Treatment/Exercise - 04/27/15 0001    Shoulder Exercises: Supine   Other Supine Exercises  D2 flex (sash- thumb up) with green band x 10 x 2 sets with RUE.    Shoulder Exercises: Seated   Row Both;Theraband;10 reps  2 sets. Blue band    Shoulder Exercises: Sidelying   External Rotation Strengthening;Right;10 reps;Weights  3 sets   External Rotation Weight (lbs) 4#   Shoulder Exercises: Standing   Horizontal ABduction Both;20 reps;Theraband;Strengthening   Theraband Level (Shoulder Horizontal ABduction) Level 4 (Blue)   Shoulder Exercises: ROM/Strengthening   UBE (Upper Arm Bike) L4: 2 min each way   Shoulder Exercises: Stretch   Other Shoulder Stretches IR towel stretch x 2 min; doorway stretch 30 sec x 2, 2 sets   Manual Therapy   Manual Therapy Joint mobilization;Passive ROM   Joint Mobilization Rt shoulder inf and post GH grade III mobs,  abduction with ER oscillations   Passive ROM into ER, sustained and oscillations                      PT Long Term Goals - 04/18/15 0710    PT LONG TERM GOAL #1   Title I with HEP   Time 4   Period Weeks   Status New   PT  LONG TERM GOAL #2   Title increase Rt shoulder ER =/> 70 degrees to allow him to get his hand behind his head without pain   Time 4   Period Weeks   Status New   PT LONG TERM GOAL #3   Title increase strength Rt shoulder =/> 5/5 throughout   Time 4   Period Weeks   Status New   PT LONG TERM GOAL #4   Title improve FOTO =/< CJ level    Time 4   Period Weeks   Status New               Plan - 04/27/15 0749    Clinical Impression Statement Pt demo improved Rt shoulder ER after manual therapy (increased to 62 degrees).  Pt tolerated increased resistance for ther ex without increased resistance. Progressing towards goals.    Pt will benefit from skilled therapeutic intervention in order to improve on the following deficits Impaired UE functional use;Pain;Decreased strength   Rehab Potential Excellent   PT Frequency 2x / week   PT Duration 4 weeks   PT Treatment/Interventions  Patient/family education;Moist Heat;Cryotherapy;Electrical Stimulation;Manual techniques;Ultrasound;Therapeutic exercise   PT Next Visit Plan Continue Rt shoulder mob for rotation, RTC strengthening   Consulted and Agree with Plan of Care Patient        Problem List Patient Active Problem List   Diagnosis Date Noted  . Right shoulder pain 04/14/2015  . 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 Hypogonadism male 02/26/2014  . Lower leg edema 12/02/2013  . Acute renal failure 12/02/2013  . Lymph node enlargement 12/02/2013  . CKD (chronic kidney disease) stage 3, GFR 30-59 ml/min 05/28/2013  . Atrial fibrillation 06/25/2012  . BMI 36.0-36.9,adult 06/19/2012  . 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 RESTRICTIVE LUNG DISEASE 01/12/2011  . ERECTILE DYSFUNCTION 01/06/2008  . ESSENTIAL HYPERTENSION, BENIGN 01/05/2008  . Venous (peripheral) insufficiency 01/05/2008    Kerin Perna, PTA 04/27/2015 7:55 AM  [facility] [place], [place], [address] Phone: [phone]   Fax:  [phone]

## 2015-04-29 ENCOUNTER — Ambulatory Visit (INDEPENDENT_AMBULATORY_CARE_PROVIDER_SITE_OTHER): Payer: Commercial Managed Care - HMO | Admitting: Family Medicine

## 2015-04-29 VITALS — BP 116/73 | HR 102 | Wt 274.0 lb

## 2015-04-29 DIAGNOSIS — E291 Testicular hypofunction: Secondary | ICD-10-CM

## 2015-04-29 MED ORDER — TESTOSTERONE CYPIONATE 200 MG/ML IM SOLN
200.0000 mg | Freq: Once | INTRAMUSCULAR | Status: AC
  Administered 2015-04-29: 200 mg via INTRAMUSCULAR

## 2015-04-29 NOTE — Progress Notes (Signed)
   Subjective:    Patient ID: Jerome Hicks, male    DOB: 05-08-1951, 64 y.o.   MRN: [id number]  HPI  Patient came into office today for testosterone injection. Denies chest pain, shortness of breath, headaches and problems associated with taking this medication. Patient states he has had no abnornal mood swings.   Review of Systems     Objective:   Physical Exam        Assessment & Plan:  Patient tolerated injection in [place] well without complications. Patient advised to schedule his next injection for 2 weeks from today.

## 2015-05-01 NOTE — Progress Notes (Signed)
   Subjective:    Patient ID: Jerome Hicks, male    DOB: 09/19/1951, 64 y.o.   MRN: [id number]  HPI    Review of Systems     Objective:   Physical Exam        Assessment & Plan:  AGree with below.  Beatrice Lecher, MD

## 2015-05-04 ENCOUNTER — Ambulatory Visit (INDEPENDENT_AMBULATORY_CARE_PROVIDER_SITE_OTHER): Payer: Commercial Managed Care - HMO | Admitting: Physical Therapy

## 2015-05-04 DIAGNOSIS — R29898 Other symptoms and signs involving the musculoskeletal system: Secondary | ICD-10-CM | POA: Diagnosis not present

## 2015-05-04 DIAGNOSIS — M25511 Pain in right shoulder: Secondary | ICD-10-CM | POA: Diagnosis not present

## 2015-05-04 DIAGNOSIS — M25611 Stiffness of right shoulder, not elsewhere classified: Secondary | ICD-10-CM | POA: Diagnosis not present

## 2015-05-04 NOTE — Therapy (Signed)
[place], [place], [address] Phone: [phone]   Fax:  [phone]  Physical Therapy Treatment  Patient Details  Name: Jerome Hicks MRN: [id number] Date of Birth: 14-Mar-1951 Referring Provider:  Silverio Decamp,*  Encounter Date: 05/04/2015      PT End of Session - 05/04/15 0705    Visit Number 5   Number of Visits 8   Date for PT Re-Evaluation 05/16/15   PT Start Time 0704   PT Stop Time 0750   PT Time Calculation (min) 46 min      Past Medical History  Diagnosis Date  . Kidney stones   . 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 ED (erectile dysfunction)     has been prescribed Levitra and Viagrain in the past  . Scrotal abscess 3-03  . Leg edema     recurrent cellulitis  . Internal hemorrhoids   . Sigmoid diverticulosis   . Atrial fibrillation   . Hypertension     Past Surgical History  Procedure Laterality Date  . Cholecystectomy  1971  . Kidney stones  1990's    Ureteroscopic stone extraction  .  mrsa infection in left groin requiring surgical debridement  12-08  . Tonsillectomy      There were no vitals filed for this visit.  Visit Diagnosis:  Stiffness of shoulder joint, right  Weakness of shoulder  Pain in joint, shoulder region, right      Subjective Assessment - 05/04/15 0706    Subjective "I can notice a big difference since I started coming"    Currently in Pain? No/denies            [facility] PT Assessment - 05/04/15 0001    Assessment   Medical Diagnosis Rt shoulder pain   Onset Date/Surgical Date 03/19/15   ROM / Strength   AROM / PROM / Strength AROM;PROM;Strength   AROM   AROM Assessment Site Shoulder   Right/Left Shoulder Right   Right Shoulder External Rotation 54 Degrees  66 deg after manual   PROM   PROM Assessment Site Shoulder   Right/Left Shoulder Right   Right Shoulder External Rotation 80 Degrees  after manual therapy   Strength   Strength Assessment Site Shoulder   Right/Left Shoulder Right   Right Shoulder Flexion 5/5   Right Shoulder ABduction 5/5   Right Shoulder Internal Rotation 5/5  ~4" difference with arm behind back   Right Shoulder External Rotation --  5-/5                     [facility] Adult PT Treatment/Exercise - 05/04/15 0001    Shoulder Exercises: Supine   Other Supine Exercises D2 flex (sash- thumb up) with blue band x 10 x 2 sets with RUE.    Shoulder Exercises: Seated   Row Strengthening;Both;20 reps;Theraband   Theraband Level (Shoulder Row) Level 4 (Blue)   Shoulder Exercises: Standing   Horizontal ABduction Both;Strengthening;20 reps;Theraband   Theraband Level (Shoulder Horizontal ABduction) Level 4 (Blue)   External Rotation Strengthening;Both;20 reps;Theraband   Theraband Level (Shoulder External Rotation) Level 4 (Blue)   Other Standing Exercises IR towel stretch with RUE - 2 min    Other Standing Exercises full can with 3# to 90 deg. x 10 rep. x 2 sets 2nd set with 4#)   Shoulder Exercises: ROM/Strengthening   UBE (Upper Arm Bike) L5: 2 min each way   Manual Therapy  Manual Therapy Joint mobilization;Passive ROM;Myofascial release   Joint Mobilization Rt shoulder inf and post GH grade III mobs,  abduction with ER oscillations   Myofascial Release Rt pec release    Passive ROM into ER, sustained and oscillations                      PT Long Term Goals - 05/04/15 8242    PT LONG TERM GOAL #1   Title I with HEP   Time 4   Period Weeks   Status On-going   PT LONG TERM GOAL #2   Title increase Rt shoulder ER =/> 70 degrees to allow him to get his hand behind his head without pain   Time 4   Period Weeks   Status Partially Met   PT LONG TERM GOAL #3   Title increase strength Rt shoulder =/> 5/5 throughout   Time 4   Period Weeks   Status Partially Met   PT LONG TERM GOAL #4   Title improve FOTO =/< CJ level    Time 4   Period Weeks    Status On-going               Plan - 05/04/15 0754    Clinical Impression Statement Pt demo improved Rt ROM this visit (ER AROM to 66 degrees).  Pt tolerated all exercises without increase in pain. Pt can place Rt hand behind head without pain, partially meeting LTG#2.    Pt will benefit from skilled therapeutic intervention in order to improve on the following deficits Impaired UE functional use;Pain;Decreased strength   Rehab Potential Excellent   PT Frequency 2x / week   PT Duration 4 weeks   PT Treatment/Interventions Patient/family education;Moist Heat;Cryotherapy;Electrical Stimulation;Manual techniques;Ultrasound;Therapeutic exercise   PT Next Visit Plan Continue per POC.    Consulted and Agree with Plan of Care Patient        Problem List Patient Active Problem List   Diagnosis Date Noted  . Right shoulder pain 04/14/2015  . 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 Hypogonadism male 02/26/2014  . Lower leg edema 12/02/2013  . Acute renal failure 12/02/2013  . Lymph node enlargement 12/02/2013  . CKD (chronic kidney disease) stage 3, GFR 30-59 ml/min 05/28/2013  . Atrial fibrillation 06/25/2012  . BMI 36.0-36.9,adult 06/19/2012  . 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 RESTRICTIVE LUNG DISEASE 01/12/2011  . ERECTILE DYSFUNCTION 01/06/2008  . ESSENTIAL HYPERTENSION, BENIGN 01/05/2008  . Venous (peripheral) insufficiency 01/05/2008    Kerin Perna, PTA 05/04/2015 8:17 AM  [facility] [place], [place], [address] Phone: [phone]   Fax:  [phone]

## 2015-05-06 ENCOUNTER — Ambulatory Visit (INDEPENDENT_AMBULATORY_CARE_PROVIDER_SITE_OTHER): Payer: Commercial Managed Care - HMO | Admitting: Rehabilitative and Restorative Service Providers"

## 2015-05-06 DIAGNOSIS — R29898 Other symptoms and signs involving the musculoskeletal system: Secondary | ICD-10-CM

## 2015-05-06 DIAGNOSIS — M25611 Stiffness of right shoulder, not elsewhere classified: Secondary | ICD-10-CM

## 2015-05-06 DIAGNOSIS — M25511 Pain in right shoulder: Secondary | ICD-10-CM | POA: Diagnosis not present

## 2015-05-06 NOTE — Patient Instructions (Signed)
Scapula Adduction With Pectorals, Low   Stand in doorframe with palms against frame and arms at 45. Lean forward and squeeze shoulder blades. Hold _30__ seconds. Repeat _3__ times per session. Do _3__ sessions per day.  Copyright  VHI. All rights reserved.    Scapula Adduction With Pectorals, Mid-Range   Stand in doorframe with palms against frame and arms at 90. Lean forward and squeeze shoulder blades. Hold _30__ seconds. Repeat _3__ times per session. Do _3__ sessions per day. \Scapula Adduction With Pectorals, High   Stand in doorframe with palms against frame and arms at 120. Lean forward and squeeze shoulder blades. Hold _30__ seconds. Repeat _3__ times per session. Do _3__ sessions per day.  Copyright  VHI. All rights reserved.

## 2015-05-06 NOTE — Therapy (Signed)
[place], [place], [address] Phone: [phone]   Fax:  [phone]  Physical Therapy Treatment  Patient Details  Name: Jerome Hicks MRN: [id number] Date of Birth: [birth year] Referring Provider:  Silverio Decamp,*  Encounter Date: 05/06/2015      PT End of Session - 05/06/15 0807    Visit Number 6   Number of Visits 8   Date for PT Re-Evaluation 05/16/15   PT Start Time 0807   PT Stop Time 3888   PT Time Calculation (min) 48 min   Activity Tolerance Patient tolerated treatment well;No increased pain      Past Medical History  Diagnosis Date  . Kidney stones   . 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 ED (erectile dysfunction)     has been prescribed Levitra and Viagrain in the past  . Scrotal abscess 3-03  . Leg edema     recurrent cellulitis  . Internal hemorrhoids   . Sigmoid diverticulosis   . Atrial fibrillation   . Hypertension     Past Surgical History  Procedure Laterality Date  . Cholecystectomy  1971  . Kidney stones  1990's    Ureteroscopic stone extraction  .  mrsa infection in left groin requiring surgical debridement  12-08  . Tonsillectomy      There were no vitals filed for this visit.  Visit Diagnosis:  Stiffness of shoulder joint, right  Weakness of shoulder  Pain in joint, shoulder region, right      Subjective Assessment - 05/06/15 0808    Subjective Shoulder "feels good". He still has some pain at times.   Patient Stated Goals split wood, move and stack it without pain.  Reach behind back.    Currently in Pain? No/denies           [facility] Adult PT Treatment/Exercise - 05/06/15 0001    Shoulder Exercises: Supine   Horizontal ABduction Strengthening;Both;10 reps   Theraband Level (Shoulder Horizontal ABduction) Level 4 (Blue)   Other Supine Exercises D2 flex (sash- thumb up) with blue band x 10 x 2 sets with RUE.    Shoulder Exercises:  Seated   Other Seated Exercises pulley 10 reps 10 sec hold   Shoulder Exercises: Standing   Horizontal ABduction Strengthening;20 reps;Theraband;Right   Theraband Level (Shoulder Horizontal ABduction) Level 3 (Green)   External Rotation Strengthening;10 reps;Theraband;Right   Theraband Level (Shoulder External Rotation) Level 3 (Green)   Other Standing Exercises IR towel stretch with RUE - 2 min    Shoulder Exercises: Stretch   Other Shoulder Stretches doorway pec stretch 3 positions - x3 reps 20-30 sec hold    Manual Therapy   Manual Therapy Joint mobilization;Passive ROM;Myofascial release   Myofascial Release Rt pec release    Passive ROM into ER, sustained and oscillations/ shd flexion stretch supine          PT Education - 05/06/15 0825    Education provided Yes   Education Details Added two additional positions for doorway stretch; supine thoracic extension on foam roll x 5 minutes   Person(s) Educated Patient   Methods Explanation;Demonstration;Tactile cues;Verbal cues;Handout   Comprehension Verbalized understanding;Returned demonstration;Verbal cues required             PT Long Term Goals - 05/04/15 2800    PT LONG TERM GOAL #1   Title I with HEP   Time 4   Period Weeks   Status On-going  PT LONG TERM GOAL #2   Title increase Rt shoulder ER =/> 70 degrees to allow him to get his hand behind his head without pain   Time 4   Period Weeks   Status Partially Met   PT LONG TERM GOAL #3   Title increase strength Rt shoulder =/> 5/5 throughout   Time 4   Period Weeks   Status Partially Met   PT LONG TERM GOAL #4   Title improve FOTO =/< CJ level    Time 4   Period Weeks   Status On-going            Plan - 05/06/15 0910    Clinical Impression Statement Continued improvement Rt ER 70 degrees. Improved mobility noted with doorway stretch.    Pt will benefit from skilled therapeutic intervention in order to improve on the following deficits Impaired UE  functional use;Pain;Decreased strength   Rehab Potential Excellent   PT Frequency 2x / week   PT Duration 4 weeks   PT Treatment/Interventions Patient/family education;Moist Heat;Cryotherapy;Electrical Stimulation;Manual techniques;Ultrasound;Therapeutic exercise   PT Next Visit Plan Continue per POC. working on posterior shoulder girdle strengthening and pec stretch   Consulted and Agree with Plan of Care Patient        Problem List Patient Active Problem List   Diagnosis Date Noted  . Right shoulder pain 04/14/2015  . 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 Hypogonadism male 02/26/2014  . Lower leg edema 12/02/2013  . Acute renal failure 12/02/2013  . Lymph node enlargement 12/02/2013  . CKD (chronic kidney disease) stage 3, GFR 30-59 ml/min 05/28/2013  . Atrial fibrillation 06/25/2012  . BMI 36.0-36.9,adult 06/19/2012  . 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 RESTRICTIVE LUNG DISEASE 01/12/2011  . ERECTILE DYSFUNCTION 01/06/2008  . ESSENTIAL HYPERTENSION, BENIGN 01/05/2008  . Venous (peripheral) insufficiency 01/05/2008    Bryan Goin Nilda Simmer, PT, MPH 05/06/2015, 9:14 AM  [facility] [place], [place], [address] Phone: [phone]   Fax:  [phone]

## 2015-05-09 ENCOUNTER — Ambulatory Visit (INDEPENDENT_AMBULATORY_CARE_PROVIDER_SITE_OTHER): Payer: Commercial Managed Care - HMO | Admitting: Rehabilitative and Restorative Service Providers"

## 2015-05-09 DIAGNOSIS — R29898 Other symptoms and signs involving the musculoskeletal system: Secondary | ICD-10-CM

## 2015-05-09 DIAGNOSIS — M25611 Stiffness of right shoulder, not elsewhere classified: Secondary | ICD-10-CM

## 2015-05-09 DIAGNOSIS — M25511 Pain in right shoulder: Secondary | ICD-10-CM | POA: Diagnosis not present

## 2015-05-09 NOTE — Patient Instructions (Signed)
Caudal Rotation: Hip Roll, Crossed Legs - Supine  Don't have to cross legs Lie with legs crossed and slightly elevated. Tighten stomach and lower knees out to right side, rotating hips and trunk. Keep stomach tight for return. Repeat _3___ times per set. Do 1____ sets per session. Do _1-2___ sessions per week.  Copyright  VHI. All rights reserved.

## 2015-05-09 NOTE — Therapy (Signed)
[place], [place], [address] Phone: [phone]   Fax:  [phone]  Physical Therapy Treatment  Patient Details  Name: Jerome Hicks MRN: [id number] Date of Birth: 1951-01-02 Referring Provider:  Silverio Decamp,*  Encounter Date: 05/09/2015      PT End of Session - 05/09/15 0706    Visit Number 7   Number of Visits 8   Date for PT Re-Evaluation 05/16/15   PT Start Time 0706   PT Stop Time 0751   PT Time Calculation (min) 45 min   Activity Tolerance --      Past Medical History  Diagnosis Date  . Kidney stones   . 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 ED (erectile dysfunction)     has been prescribed Levitra and Viagrain in the past  . Scrotal abscess 3-03  . Leg edema     recurrent cellulitis  . Internal hemorrhoids   . Sigmoid diverticulosis   . Atrial fibrillation   . Hypertension     Past Surgical History  Procedure Laterality Date  . Cholecystectomy  1971  . Kidney stones  1990's    Ureteroscopic stone extraction  .  mrsa infection in left groin requiring surgical debridement  12-08  . Tonsillectomy      There were no vitals filed for this visit.  Visit Diagnosis:  Stiffness of shoulder joint, right  Weakness of shoulder  Pain in joint, shoulder region, right      Subjective Assessment - 05/09/15 0709    Subjective Shoulder "fells good" - working on exercises at home.    Patient Stated Goals split wood, move and stack it without pain.  Reach behind back.    Currently in Pain? No/denies            [facility] Adult PT Treatment/Exercise - 05/09/15 0001    Shoulder Exercises: Supine   Other Supine Exercises supine stretch over foam roll 3-4 min   Shoulder Exercises: Seated   Other Seated Exercises pulley 10 reps 10 sec hold   Shoulder Exercises: Standing   Horizontal ABduction Strengthening;20 reps;Theraband;Right   External Rotation Strengthening;10  reps;Theraband;Right   Other Standing Exercises IR towel stretch with RUE - 2 min    Shoulder Exercises: Therapy Ball   Other Therapy Ball Exercises Stepping under exercise ball to stretch into flexion 5 reps 20 sec hold    Shoulder Exercises: Isometric Strengthening   Extension Theraband  shoulder extension standing 3x10 reps   Shoulder Exercises: Stretch   Other Shoulder Stretches doorway pec stretch 3 positions - x3 reps 20-30 sec hold    Other Shoulder Stretches IR stretch with strap 3 reps 20 sec hold   Shoulder Exercises: Body Blade   Other Body Blade Exercises scaption with scap squeeze 2 reps x 1 min ea           PT Education - 05/09/15 0758    Education provided Yes   Education Details Added stepping under exercise ball to stretch into shoulder flexion; added trunk rotatioin supine for stretch through trunk and anterior shoulder; continued education re the improtance of consistent exercises at home   Person(s) Educated Patient   Methods Explanation;Demonstration;Tactile cues;Verbal cues;Handout   Comprehension Verbalized understanding;Returned demonstration;Verbal cues required            PT Long Term Goals - 05/09/15 0719    PT LONG TERM GOAL #1   Title I with HEP  Time 4   Status Achieved   PT LONG TERM GOAL #2   Title increase Rt shoulder ER =/> 70 degrees to allow him to get his hand behind his head without pain   Period Weeks   Status Partially Met   PT LONG TERM GOAL #3   Title increase strength Rt shoulder =/> 5/5 throughout   Time 4   Period Weeks   Status Partially Met   PT LONG TERM GOAL #4   Title improve FOTO =/< CJ level    Time 4   Period Weeks   Status On-going             Plan - 05/09/15 8718    Clinical Impression Statement Improving mobility. Activity level continues to increase. Little to no pain. Working on exercises. Will be traveling the end of this week. To cointinue exercises and moniter progress. Wiull schedule one  additional visit in ~1 week to reassess for discharge.    Pt will benefit from skilled therapeutic intervention in order to improve on the following deficits Impaired UE functional use;Pain;Decreased strength   Rehab Potential Excellent   PT Frequency 2x / week   PT Duration 4 weeks   PT Treatment/Interventions Patient/family education;Moist Heat;Cryotherapy;Electrical Stimulation;Manual techniques;Ultrasound;Therapeutic exercise   PT Next Visit Plan Continue per POC. working on posterior shoulder girdle strengthening and pec stretch; re-evaluate for discharge.   PT Home Exercise Plan HEP - encouraged patient to continue with home exercise program on a regular basis.   Consulted and Agree with Plan of Care Patient        Problem List Patient Active Problem List   Diagnosis Date Noted  . Right shoulder pain 04/14/2015  . 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 Hypogonadism male 02/26/2014  . Lower leg edema 12/02/2013  . Acute renal failure 12/02/2013  . Lymph node enlargement 12/02/2013  . CKD (chronic kidney disease) stage 3, GFR 30-59 ml/min 05/28/2013  . Atrial fibrillation 06/25/2012  . BMI 36.0-36.9,adult 06/19/2012  . 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 RESTRICTIVE LUNG DISEASE 01/12/2011  . ERECTILE DYSFUNCTION 01/06/2008  . ESSENTIAL HYPERTENSION, BENIGN 01/05/2008  . Venous (peripheral) insufficiency 01/05/2008    Maely Clements Nilda Simmer, PT, MPH 05/09/2015, 8:04 AM  [facility] [place], [place], [address] Phone: [phone]   Fax:  [phone]

## 2015-05-13 ENCOUNTER — Ambulatory Visit: Payer: Commercial Managed Care - HMO

## 2015-05-16 ENCOUNTER — Ambulatory Visit (INDEPENDENT_AMBULATORY_CARE_PROVIDER_SITE_OTHER): Payer: Commercial Managed Care - HMO | Admitting: Rehabilitative and Restorative Service Providers"

## 2015-05-16 ENCOUNTER — Ambulatory Visit (INDEPENDENT_AMBULATORY_CARE_PROVIDER_SITE_OTHER): Payer: Commercial Managed Care - HMO | Admitting: Family Medicine

## 2015-05-16 ENCOUNTER — Encounter: Payer: Self-pay | Admitting: Rehabilitative and Restorative Service Providers"

## 2015-05-16 VITALS — BP 137/77 | HR 80 | Wt 293.0 lb

## 2015-05-16 DIAGNOSIS — E291 Testicular hypofunction: Secondary | ICD-10-CM | POA: Diagnosis not present

## 2015-05-16 DIAGNOSIS — M25611 Stiffness of right shoulder, not elsewhere classified: Secondary | ICD-10-CM

## 2015-05-16 DIAGNOSIS — M25511 Pain in right shoulder: Secondary | ICD-10-CM

## 2015-05-16 DIAGNOSIS — R29898 Other symptoms and signs involving the musculoskeletal system: Secondary | ICD-10-CM

## 2015-05-16 MED ORDER — TESTOSTERONE CYPIONATE 200 MG/ML IM SOLN
200.0000 mg | Freq: Once | INTRAMUSCULAR | Status: AC
  Administered 2015-05-16: 200 mg via INTRAMUSCULAR

## 2015-05-16 NOTE — Progress Notes (Signed)
   Subjective:    Patient ID: Jerome Hicks, male    DOB: 1951-06-27, 64 y.o.   MRN: [id number]  HPI Patient came into office today for testosterone injection. Denies chest pain, shortness of breath, headaches and problems associated with taking this medication. Patient states he has had no abnornal mood swings.    Review of Systems     Objective:   Physical Exam        Assessment & Plan:  Patient tolerated injection in [place] well without complications. Patient advised to schedule his next injection for 2 weeks from today.

## 2015-05-16 NOTE — Progress Notes (Signed)
   Subjective:    Patient ID: Jerome Hicks, male    DOB: 1951-08-20, 64 y.o.   MRN: [id number]  HPI    Review of Systems     Objective:   Physical Exam        Assessment & Plan:  Agree with below.   Beatrice Lecher, MD

## 2015-05-16 NOTE — Patient Instructions (Signed)
Encouraged patient to continue with I HEP to prevent recurrent problems

## 2015-05-16 NOTE — Therapy (Signed)
[place], [place], [address] Phone: [phone]   Fax:  [phone]  Physical Therapy Treatment  Patient Details  Name: Jerome Hicks MRN: [id number] Date of Birth: Apr 13, 1951 Referring Provider:  Silverio Decamp,*  Encounter Date: 05/16/2015      PT End of Session - 05/16/15 0707    Visit Number 8   Number of Visits 8   Date for PT Re-Evaluation 05/16/15   PT Start Time 0704   PT Stop Time 0740   PT Time Calculation (min) 36 min   Activity Tolerance --      Past Medical History  Diagnosis Date  . Kidney stones   . 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 ED (erectile dysfunction)     has been prescribed Levitra and Viagrain in the past  . Scrotal abscess 3-03  . Leg edema     recurrent cellulitis  . Internal hemorrhoids   . Sigmoid diverticulosis   . Atrial fibrillation   . Hypertension     Past Surgical History  Procedure Laterality Date  . Cholecystectomy  1971  . Kidney stones  1990's    Ureteroscopic stone extraction  .  mrsa infection in left groin requiring surgical debridement  12-08  . Tonsillectomy      There were no vitals filed for this visit.  Visit Diagnosis:  Stiffness of shoulder joint, right  Weakness of shoulder  Pain in joint, shoulder region, right      Subjective Assessment - 05/16/15 0708    Subjective Jerome Hicks reports that his shoulder is feeling good. He is not having pain and he is doing his exercises at home.   Patient Stated Goals split wood, move and stack it without pain.  Reach behind back. All accomplished!   Currently in Pain? No/denies            [facility] PT Assessment - 05/16/15 0001    Assessment   Medical Diagnosis Rt shoulder pain   Onset Date/Surgical Date 03/19/15   ROM / Strength   AROM / PROM / Strength AROM;Strength   AROM   AROM Assessment Site Shoulder   Right/Left Shoulder Right   Right Shoulder External  Rotation 64 Degrees   PROM   PROM Assessment Site Shoulder   Right/Left Shoulder Right   Right Shoulder External Rotation 80 Degrees   Strength   Strength Assessment Site Shoulder   Right/Left Shoulder Right   Right Shoulder Flexion 5/5   Right Shoulder Extension 5/5   Right Shoulder ABduction 5/5   Right Shoulder Internal Rotation 5/5   Right Shoulder External Rotation 5/5             [facility] Adult PT Treatment/Exercise - 05/16/15 0001    Shoulder Exercises: Supine   Horizontal ABduction Strengthening   Theraband Level (Shoulder Horizontal ABduction) Level 4 (Blue)  10 x2   Other Supine Exercises supine stretch over foam roll 3-4 min   Shoulder Exercises: Seated   Row Strengthening   Theraband Level (Shoulder Row) Level 3 (Green)  10x2   Other Seated Exercises pulley 10 reps 10 sec hold   Shoulder Exercises: Standing   Horizontal ABduction Strengthening;20 reps;Theraband;Right   Theraband Level (Shoulder Horizontal ABduction) Level 3 (Green)  10x2   External Rotation Strengthening;10 reps;Theraband;Right   Theraband Level (Shoulder External Rotation) --  10x2   Other Standing Exercises IR towel stretch with RUE - 2 min    Shoulder Exercises:  Therapy Ball   Other Therapy Ball Exercises Stepping under exercise ball to stretch into flexion 5 reps 20 sec hold    Shoulder Exercises: ROM/Strengthening   UBE (Upper Arm Bike) L5 2 min each way    Shoulder Exercises: Isometric Strengthening   Extension Theraband  shoulder extension standing 3x10 reps   Shoulder Exercises: Stretch   Other Shoulder Stretches doorway pec stretch 3 positions - x3 reps 20-30 sec hold    Other Shoulder Stretches IR stretch with strap 3 reps 20 sec hold   Shoulder Exercises: Body Blade   Other Body Blade Exercises scaption with scap squeeze 2 reps x 1 min ea   Manual Therapy   Myofascial Release Rt pec release work             PT Education - 05/16/15 0744    Education provided Yes    Education Details Encouraged patient to continue with HEP to prevent recurrent problems.    Person(s) Educated Patient   Methods Explanation   Comprehension Verbalized understanding             PT Long Term Goals - 05/16/15 0749    PT LONG TERM GOAL #1   Title I with HEP   Time 4   Period Weeks   Status Achieved   PT LONG TERM GOAL #2   Title increase Rt shoulder ER =/> 70 degrees to allow him to get his hand behind his head without pain   Time 4   Period Weeks   Status Achieved   PT LONG TERM GOAL #3   Title increase strength Rt shoulder =/> 5/5 throughout   Period Weeks   Status Achieved   PT LONG TERM GOAL #4   Title improve FOTO =/< CJ level    Time 4   Period Weeks   Status Achieved              Plan - 05/16/15 0745    Clinical Impression Statement Jerome Hicks has made excellent progress with rehab and home program. He has no pain and has accomplished stated goals. He needs to continue with HEP and was encouraged several times to do so. Jerome Hicks has some persistent tightness through the pecs/anterior shoulder and needs to continue stretching anterior chest/shoulder and strengehening posterior shoulder girdle to prevent additional problems.    Pt will benefit from skilled therapeutic intervention in order to improve on the following deficits Impaired UE functional use;Pain;Decreased strength   Rehab Potential Excellent   PT Frequency 2x / week   PT Duration 4 weeks   PT Treatment/Interventions Patient/family education;Moist Heat;Cryotherapy;Electrical Stimulation;Manual techniques;Ultrasound;Therapeutic exercise   PT Next Visit Plan D/C to I HEP   PT Home Exercise Plan HEP - encouraged patient to continue with home exercise program on a regular basis.   Consulted and Agree with Plan of Care Patient        Problem List Patient Active Problem List   Diagnosis Date Noted  . Right shoulder pain 04/14/2015  . 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 Hypogonadism male  02/26/2014  . Lower leg edema 12/02/2013  . Acute renal failure 12/02/2013  . Lymph node enlargement 12/02/2013  . CKD (chronic kidney disease) stage 3, GFR 30-59 ml/min 05/28/2013  . Atrial fibrillation 06/25/2012  . BMI 36.0-36.9,adult 06/19/2012  . 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 RESTRICTIVE LUNG DISEASE 01/12/2011  . ERECTILE DYSFUNCTION 01/06/2008  . ESSENTIAL HYPERTENSION, BENIGN 01/05/2008  . Venous (peripheral) insufficiency 01/05/2008    Amaiyah Nordhoff Nilda Simmer, PT, MPH  05/16/2015, 7:54 AM  [facility] [place], [place], [address] Phone: [phone]   Fax:  [phone]   PHYSICAL THERAPY DISCHARGE SUMMARY  Visits from Start of Care: 8  Current functional level related to goals / functional outcomes: Excellent outcome with goals of therapy accomplished. Jerome Hicks continues to have some anterior shoulder/pec tightness and has been strongly encouraged to continue with his HEP to prevent recurrent problems.    Remaining deficits: Tight shoulder ER at 90 degrees shoulder flexion    Education / Equipment: HEP  Plan: Patient agrees to discharge.  Patient goals were met. Patient is being discharged due to meeting the stated rehab goals.  ?????

## 2015-05-19 ENCOUNTER — Other Ambulatory Visit: Payer: Self-pay | Admitting: Family Medicine

## 2015-05-19 MED ORDER — POTASSIUM CHLORIDE CRYS ER 10 MEQ PO TBCR: EXTENDED_RELEASE_TABLET | ORAL | Status: DC

## 2015-05-19 MED ORDER — FUROSEMIDE 40 MG PO TABS: ORAL_TABLET | ORAL | Status: DC

## 2015-05-19 MED ORDER — TRAMADOL HCL 50 MG PO TABS: 50.0000 mg | ORAL_TABLET | Freq: Three times a day (TID) | ORAL | Status: DC | PRN

## 2015-05-26 ENCOUNTER — Encounter: Payer: Self-pay | Admitting: Sports Medicine

## 2015-05-26 ENCOUNTER — Ambulatory Visit (INDEPENDENT_AMBULATORY_CARE_PROVIDER_SITE_OTHER): Payer: Commercial Managed Care - HMO | Admitting: Sports Medicine

## 2015-05-26 VITALS — BP 133/80 | HR 89 | Ht 75.0 in | Wt 291.0 lb

## 2015-05-26 DIAGNOSIS — M25511 Pain in right shoulder: Secondary | ICD-10-CM

## 2015-05-26 NOTE — Assessment & Plan Note (Signed)
Completely resolved after glenohumeral, subacromial injections, and rotator cuff physical therapy. Advised avoid overhead activities if possible, and continue at least every other day home rehabilitation regimen. Next line return to see me as needed.

## 2015-05-26 NOTE — Progress Notes (Signed)
  Subjective:    CC: Follow-up  HPI: Right shoulder pain: Patient had both subacromial and glenohumeral type pain at the last visit, subacromial and glenohumeral injections were performed, he has now been through formal physical therapy predominantly for rotator cuff strengthening, and he returns completely pain-free, able to sleep through the night.  Past medical history, Surgical history, Family history not pertinant except as noted below, Social history, Allergies, and medications have been entered into the medical record, reviewed, and no changes needed.   Review of Systems: No fevers, chills, night sweats, weight loss, chest pain, or shortness of breath.   Objective:    General: Well Developed, well nourished, and in no acute distress.  Neuro: Alert and oriented x3, extra-ocular muscles intact, sensation grossly intact.  HEENT: Normocephalic, atraumatic, pupils equal round reactive to light, neck supple, no masses, no lymphadenopathy, thyroid nonpalpable.  Skin: Warm and dry, no rashes. Cardiac: Regular rate and rhythm, no murmurs rubs or gallops, no lower extremity edema.  Respiratory: Clear to auscultation bilaterally. Not using accessory muscles, speaking in full sentences. Right Shoulder: Inspection reveals no abnormalities, atrophy or asymmetry. Palpation is normal with no tenderness over AC joint or bicipital groove. ROM is full in all planes. Rotator cuff strength normal throughout. No signs of impingement with negative Neer and Hawkin's tests, empty can. Speeds and Yergason's tests normal. No labral pathology noted with negative Obrien's, negative crank, negative clunk, and good stability. Normal scapular function observed. No painful arc and no drop arm sign. No apprehension sign  Impression and Recommendations:

## 2015-05-31 ENCOUNTER — Ambulatory Visit (INDEPENDENT_AMBULATORY_CARE_PROVIDER_SITE_OTHER): Payer: Commercial Managed Care - HMO | Admitting: Sports Medicine

## 2015-05-31 VITALS — BP 126/84 | HR 73

## 2015-05-31 DIAGNOSIS — E291 Testicular hypofunction: Secondary | ICD-10-CM

## 2015-05-31 MED ORDER — TESTOSTERONE CYPIONATE 200 MG/ML IM SOLN
200.0000 mg | Freq: Once | INTRAMUSCULAR | Status: AC
  Administered 2015-05-31: 200 mg via INTRAMUSCULAR

## 2015-05-31 NOTE — Assessment & Plan Note (Signed)
Testosterone injection as above. 

## 2015-05-31 NOTE — Progress Notes (Signed)
   Subjective:    Patient ID: Jerome Hicks, male    DOB: [birth year], 64 y.o.   MRN: [id number]  HPI Patient came into office today for testosterone injection. Denies chest pain, shortness of breath, headaches and problems associated with taking this medication. Patient states he has had no abnornal mood swings.    Review of Systems     Objective:   Physical Exam        Assessment & Plan:  Patient tolerated injection LOUQ well without complications. Patient advised to schedule his next injection for 2 weeks from today.

## 2015-06-09 ENCOUNTER — Encounter: Payer: Commercial Managed Care - HMO | Admitting: Family Medicine

## 2015-06-14 ENCOUNTER — Ambulatory Visit (INDEPENDENT_AMBULATORY_CARE_PROVIDER_SITE_OTHER): Payer: Commercial Managed Care - HMO | Admitting: Family Medicine

## 2015-06-14 VITALS — BP 149/97 | HR 86 | Wt 290.0 lb

## 2015-06-14 DIAGNOSIS — E291 Testicular hypofunction: Secondary | ICD-10-CM | POA: Diagnosis not present

## 2015-06-14 MED ORDER — TESTOSTERONE CYPIONATE 200 MG/ML IM SOLN
200.0000 mg | Freq: Once | INTRAMUSCULAR | Status: AC
  Administered 2015-06-14: 200 mg via INTRAMUSCULAR

## 2015-06-14 NOTE — Progress Notes (Signed)
   Subjective:    Patient ID: Jerome Hicks, male    DOB: 08-Sep-1951, 64 y.o.   MRN: [id number]  HPI  Patient came into office today for testosterone injection. Denies chest pain, shortness of breath, headaches and problems associated with taking this medication. Patient states he has had no abnornal mood swings. Patient does report he has been feeling tired lately, he has a physical with his PCP scheduled and he will discuss this with her at that time.   Review of Systems     Objective:   Physical Exam        Assessment & Plan:  Patient tolerated injection in [place] well without complications. Patient advised to keep his f/u scheduled with PCP and will be able to get his next injection at that time.

## 2015-07-01 ENCOUNTER — Ambulatory Visit (INDEPENDENT_AMBULATORY_CARE_PROVIDER_SITE_OTHER): Payer: Commercial Managed Care - HMO | Admitting: Family Medicine

## 2015-07-01 ENCOUNTER — Encounter: Payer: Self-pay | Admitting: Family Medicine

## 2015-07-01 VITALS — BP 121/76 | HR 88 | Ht 75.0 in | Wt 288.0 lb

## 2015-07-01 DIAGNOSIS — I10 Essential (primary) hypertension: Secondary | ICD-10-CM | POA: Diagnosis not present

## 2015-07-01 DIAGNOSIS — E291 Testicular hypofunction: Secondary | ICD-10-CM | POA: Diagnosis not present

## 2015-07-01 DIAGNOSIS — Z114 Encounter for screening for human immunodeficiency virus [HIV]: Secondary | ICD-10-CM

## 2015-07-01 DIAGNOSIS — Z1159 Encounter for screening for other viral diseases: Secondary | ICD-10-CM | POA: Diagnosis not present

## 2015-07-01 DIAGNOSIS — Z0000 Encounter for general adult medical examination without abnormal findings: Secondary | ICD-10-CM | POA: Diagnosis not present

## 2015-07-01 DIAGNOSIS — Z125 Encounter for screening for malignant neoplasm of prostate: Secondary | ICD-10-CM

## 2015-07-01 MED ORDER — TESTOSTERONE CYPIONATE 200 MG/ML IM SOLN
200.0000 mg | Freq: Once | INTRAMUSCULAR | Status: AC
  Administered 2015-07-01: 200 mg via INTRAMUSCULAR

## 2015-07-01 MED ORDER — CARVEDILOL 3.125 MG PO TABS: 3.1250 mg | ORAL_TABLET | Freq: Two times a day (BID) | ORAL | Status: DC

## 2015-07-01 MED ORDER — IMIQUIMOD 5 % EX CREA: TOPICAL_CREAM | Freq: Every day | CUTANEOUS | Status: DC

## 2015-07-01 NOTE — Patient Instructions (Signed)
Keep up a regular exercise program and make sure you are eating a healthy diet Try to eat 4 servings of dairy a day, or if you are lactose intolerant take a calcium with vitamin D daily.  Your vaccines are up to date.   

## 2015-07-01 NOTE — Progress Notes (Signed)
Subjective:    Jerome Hicks is a 64 y.o. male who presents for Medicare Annual/Subsequent preventive examination.   Preventive Screening-Counseling & Management  Tobacco History  Smoking status  . Never Smoker   Smokeless tobacco  . Not on file    Problems Prior to Visit 1.   Current Problems (verified) Patient Active Problem List   Diagnosis Date Noted  . Right shoulder pain 04/14/2015  . 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 Hypogonadism male 02/26/2014  . Lower leg edema 12/02/2013  . Acute renal failure 12/02/2013  . Lymph node enlargement 12/02/2013  . CKD (chronic kidney disease) stage 3, GFR 30-59 ml/min 05/28/2013  . Atrial fibrillation 06/25/2012  . BMI 36.0-36.9,adult 06/19/2012  . 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 RESTRICTIVE LUNG DISEASE 01/12/2011  . ERECTILE DYSFUNCTION 01/06/2008  . ESSENTIAL HYPERTENSION, BENIGN 01/05/2008  . Venous (peripheral) insufficiency 01/05/2008    Medications Prior to Visit Current Outpatient Prescriptions on File Prior to Visit  Medication Sig Dispense Refill  . apixaban (ELIQUIS) 5 MG TABS tablet Take 1 tablet (5 mg total) by mouth 2 (two) times daily. 180 tablet 3  . carvedilol (COREG) 3.125 MG tablet Take 1 tablet (3.125 mg total) by mouth 2 (two) times daily with a meal. 180 tablet 2  . clotrimazole-betamethasone (LOTRISONE) cream Apply 1 application topically.    . doxazosin (CARDURA) 4 MG tablet Take 1 tablet (4 mg total) by mouth daily. 90 tablet 1  . furosemide (LASIX) 40 MG tablet TAKE 2 TABLETS (80MG ) AS NEEDED (FLUID) 180 tablet 1  . lisinopril (PRINIVIL,ZESTRIL) 10 MG tablet Take 1 tablet (10 mg total) by mouth daily. 90 tablet 1  . meclizine (ANTIVERT) 25 MG tablet TAKE ONE TABLET BY MOUTH THREE TIMES DAILY AS NEEDED FOR DIZZINESS OR NAUSEA AS DIRECTED 90 tablet 0  . potassium chloride (K-DUR,KLOR-CON) 10 MEQ tablet TAKE 1 TABLET AS NEEDED. 90 tablet 1  . testosterone cypionate (DEPOTESTOTERONE CYPIONATE) 200 MG/ML  injection Inject 200 mg into the muscle every 14 (fourteen) days.    . traMADol (ULTRAM) 50 MG tablet Take 1 tablet (50 mg total) by mouth every 8 (eight) hours as needed. 270 tablet 1  . triamcinolone ointment (KENALOG) 0.1 %      No current facility-administered medications on file prior to visit.    Current Medications (verified) Current Outpatient Prescriptions  Medication Sig Dispense Refill  . apixaban (ELIQUIS) 5 MG TABS tablet Take 1 tablet (5 mg total) by mouth 2 (two) times daily. 180 tablet 3  . carvedilol (COREG) 3.125 MG tablet Take 1 tablet (3.125 mg total) by mouth 2 (two) times daily with a meal. 180 tablet 2  . clotrimazole-betamethasone (LOTRISONE) cream Apply 1 application topically.    . doxazosin (CARDURA) 4 MG tablet Take 1 tablet (4 mg total) by mouth daily. 90 tablet 1  . furosemide (LASIX) 40 MG tablet TAKE 2 TABLETS (80MG ) AS NEEDED (FLUID) 180 tablet 1  . lisinopril (PRINIVIL,ZESTRIL) 10 MG tablet Take 1 tablet (10 mg total) by mouth daily. 90 tablet 1  . meclizine (ANTIVERT) 25 MG tablet TAKE ONE TABLET BY MOUTH THREE TIMES DAILY AS NEEDED FOR DIZZINESS OR NAUSEA AS DIRECTED 90 tablet 0  . potassium chloride (K-DUR,KLOR-CON) 10 MEQ tablet TAKE 1 TABLET AS NEEDED. 90 tablet 1  . testosterone cypionate (DEPOTESTOTERONE CYPIONATE) 200 MG/ML injection Inject 200 mg into the muscle every 14 (fourteen) days.    . traMADol (ULTRAM) 50 MG tablet Take 1 tablet (50 mg total) by mouth every  8 (eight) hours as needed. 270 tablet 1  . triamcinolone ointment (KENALOG) 0.1 %      No current facility-administered medications for this visit.     Allergies (verified) Penicillins   PAST HISTORY  Family History Family History  Problem Relation Age of Onset  . Heart attack Mother 12  . Heart attack Father 41  . Heart attack Sister 20  . Heart attack Brother 10  . Multiple sclerosis Daughter     Social History History  Substance Use Topics  . Smoking status: Never  Smoker   . Smokeless tobacco: Not on file  . Alcohol Use: No    Are there smokers in your home (other than you)?  No  Risk Factors Current exercise habits: The patient does not participate in regular exercise at present.  Dietary issues discussed: None, he is obese   Cardiac risk factors: advanced age (older than 42 for men, 19 for women), hypertension, obesity (BMI >= 30 kg/m2) and sedentary lifestyle.  Depression Screen (Note: if answer to either of the following is "Yes", a more complete depression screening is indicated)   Q1: Over the past two weeks, have you felt down, depressed or hopeless? No  Q2: Over the past two weeks, have you felt little interest or pleasure in doing things? No  Have you lost interest or pleasure in daily life? No  Do you often feel hopeless? No  Do you cry easily over simple problems? No  Activities of Daily Living In your present state of health, do you have any difficulty performing the following activities?:  Driving? No Managing money?  No Feeding yourself? No Getting from bed to chair? No Climbing a flight of stairs? Yes Preparing food and eating?: No Bathing or showering? No Getting dressed: No Getting to the toilet? No Using the toilet:No Moving around from place to place: No In the past year have you fallen or had a near fall?:No   Are you sexually active?  No  Do you have more than one partner?  No  Hearing Difficulties: Yes Do you often ask people to speak up or repeat themselves? No Do you experience ringing or noises in your ears? No Do you have difficulty understanding soft or whispered voices? No   Do you feel that you have a problem with memory? No  Do you often misplace items? No  Do you feel safe at home?  Yes  Cognitive Testing  Alert? Yes  Normal Appearance?Yes  Oriented to person? Yes  Place? Yes   Time? Yes  Recall of three objects?  Yes  Can perform simple calculations? Yes  Displays appropriate  judgment?Yes  Can read the correct time from a watch face?Yes   Advanced Directives have been discussed with the patient? Yes   List the Names of Other Physician/Practitioners you currently use: 1.    Indicate any recent Medical Services you may have received from other than [facility] providers in the past year (date may be approximate).  Immunization History  Administered Date(s) Administered  . Influenza Split 08/21/2012  . Influenza Whole 08/27/2008, 11/07/2009, 10/18/2010, 08/20/2011  . Influenza,inj,Quad PF,36+ Mos 08/25/2013, 08/05/2014  . Pneumococcal Conjugate-13 09/29/2013  . Td 12/03/1994, 06/15/2009  . Zoster 06/18/2011    Screening Tests Health Maintenance  Topic Date Due  . Hepatitis C Screening  23-Mar-1951  . HIV Screening  03/22/1965  . PNA vac Low Risk Adult (2 of 2 - PPSV23) 03/23/2015  . INFLUENZA VACCINE  07/04/2015  . COLONOSCOPY  05/06/2016  . TETANUS/TDAP  06/16/2019  . ZOSTAVAX  Completed    All answers were reviewed with the patient and necessary referrals were made:  METHENEY,CATHERINE, MD   07/01/2015   History reviewed: allergies, current medications, past family history, past medical history, past social history, past surgical history and problem list  Review of Systems A comprehensive review of systems was negative.    Objective:     Vision by Snellen chart: right eye:20/25, left eye:20/25 Blood pressure 121/76, pulse 88, height 6\' 3"  (1.905 m), weight 288 lb (130.636 kg). Body mass index is 36 kg/(m^2).  BP 121/76 mmHg  Pulse 88  Ht 6\' 3"  (1.905 m)  Wt 288 lb (130.636 kg)  BMI 36.00 kg/m2 General appearance: alert, cooperative and appears stated age Head: Normocephalic, without obvious abnormality, atraumatic Eyes: conj clear, EOMI, PEELRA Ears: normal TM's and external ear canals both ears Nose: Nares normal. Septum midline. Mucosa normal. No drainage or sinus tenderness. Throat: lips, mucosa, and tongue normal; teeth and gums  normal Neck: no adenopathy, no carotid bruit, no JVD, supple, symmetrical, trachea midline and thyroid not enlarged, symmetric, no tenderness/mass/nodules Back: symmetric, no curvature. ROM normal. No CVA tenderness. Lungs: clear to auscultation bilaterally Chest wall: no tenderness Heart: regular rate and rhythm, S1, S2 normal, no murmur, click, rub or gallop Abdomen: soft, non-tender; bowel sounds normal; no masses,  no organomegaly Extremities: extremities normal, atraumatic, no cyanosis or edema Pulses: 2+ and symmetric Skin: Skin color, texture, turgor normal. No rashes or lesions Lymph nodes: Cervical adenopathy: nl  and Supraclavicular adenopathy: nl Neurologic: Alert and oriented X 3, normal strength and tone. Normal symmetric reflexes. Normal coordination and gait     Assessment:     Medicare Wellness Exam       Plan:     During the course of the visit the patient was educated and counseled about appropriate screening and preventive services including:    Pneumococcal vaccine  -discussed today.     Has lost energy for the last few months.  Occ feels SOB.  Says feels like hs not strength.  He has PFTs schedule next month.  Says it comes and goes. No hx of DM.    Needs to schedule an eye exam.  Will place referral.   Hypogonadism-given testosterone injection today. Follow-up in 2 weeks.  Diet review for nutrition referral? Yes ____  Not Indicated __x_   Patient Instructions (the written plan) was given to the patient.  Medicare Attestation I have personally reviewed: The patient's medical and social history Their use of alcohol, tobacco or illicit drugs Their current medications and supplements The patient's functional ability including ADLs,fall risks, home safety risks, cognitive, and hearing and visual impairment Diet and physical activities Evidence for depression or mood disorders  The patient's weight, height, BMI, and visual acuity have been recorded in  the chart.  I have made referrals, counseling, and provided education to the patient based on review of the above and I have provided the patient with a written personalized care plan for preventive services.     METHENEY,CATHERINE, MD   07/01/2015

## 2015-07-02 LAB — COMPLETE METABOLIC PANEL WITH GFR
ALBUMIN: 3.9 g/dL (ref 3.6–5.1)
ALK PHOS: 57 U/L (ref 40–115)
ALT: 21 U/L (ref 9–46)
AST: 24 U/L (ref 10–35)
BILIRUBIN TOTAL: 3.9 mg/dL — AB (ref 0.2–1.2)
BUN: 17 mg/dL (ref 7–25)
CHLORIDE: 101 mmol/L (ref 98–110)
CO2: 27 mmol/L (ref 20–31)
Calcium: 9 mg/dL (ref 8.6–10.3)
Creat: 1.15 mg/dL (ref 0.70–1.25)
GFR, EST NON AFRICAN AMERICAN: 66 mL/min (ref >=60)
GFR, Est African American: 77 mL/min (ref >=60)
GLUCOSE: 85 mg/dL (ref 65–99)
Potassium: 4.4 mmol/L (ref 3.5–5.3)
Sodium: 138 mmol/L (ref 135–146)
Total Protein: 6.3 g/dL (ref 6.1–8.1)

## 2015-07-02 LAB — LIPID PANEL
Cholesterol: 101 mg/dL — ABNORMAL LOW (ref 125–200)
HDL: 32 mg/dL — AB (ref >=40)
LDL Cholesterol: 60 mg/dL (ref <130)
TRIGLYCERIDES: 47 mg/dL (ref <150)
Total CHOL/HDL Ratio: 3.2 Ratio (ref <=5.0)
VLDL: 9 mg/dL (ref <30)

## 2015-07-02 LAB — PSA: PSA: 2.15 ng/mL (ref <=4.00)

## 2015-07-02 LAB — HEPATITIS C ANTIBODY: HCV Ab: NEGATIVE

## 2015-07-02 LAB — HIV ANTIBODY (ROUTINE TESTING W REFLEX): HIV: NONREACTIVE

## 2015-07-14 ENCOUNTER — Ambulatory Visit (INDEPENDENT_AMBULATORY_CARE_PROVIDER_SITE_OTHER): Payer: Commercial Managed Care - HMO | Admitting: Family Medicine

## 2015-07-14 VITALS — BP 141/83 | HR 86 | Wt 289.0 lb

## 2015-07-14 DIAGNOSIS — E291 Testicular hypofunction: Secondary | ICD-10-CM

## 2015-07-14 DIAGNOSIS — Z23 Encounter for immunization: Secondary | ICD-10-CM | POA: Diagnosis not present

## 2015-07-14 MED ORDER — TESTOSTERONE CYPIONATE 200 MG/ML IM SOLN
200.0000 mg | Freq: Once | INTRAMUSCULAR | Status: AC
  Administered 2015-07-14: 200 mg via INTRAMUSCULAR

## 2015-07-14 NOTE — Progress Notes (Signed)
Added a reminder to next visit.

## 2015-07-14 NOTE — Progress Notes (Signed)
   Subjective:    Patient ID: Jerome Hicks, male    DOB: 08-20-1951, 64 y.o.   MRN: [id number]  HPI Patient came into office today for testosterone injection. Denies chest pain, shortness of breath, headaches and problems associated with taking this medication. Patient states he has had no abnornal mood swings. Pt also needs his Pneumococcal 23 vaccine.   Review of Systems     Objective:   Physical Exam        Assessment & Plan:  Patient tolerated testosterone injection in ROUQ and Pneumococcal 23 in Left Deltoid well without complications. Patient advised to schedule his next testosterone injection for 3 weeks from today.

## 2015-07-14 NOTE — Progress Notes (Signed)
Jerome Hicks, Will you please let patient know that I'd recommend he ask Dr. Jerilynn Mages if she can give him an order for his hemoglobin to be checked next time he sees her.  This is a standard monitoring test for guys getting testosterone shot.

## 2015-07-25 ENCOUNTER — Telehealth: Payer: Self-pay | Admitting: Family Medicine

## 2015-07-25 NOTE — Telephone Encounter (Signed)
Patient cancelled his Nurse Visit for Shot because he feels like he don't feel like they are helping and he also wants Dr.Metheney to know that his cream she called in is too expensive and needs something else. Please call patient back and discuss with him

## 2015-07-26 NOTE — Telephone Encounter (Signed)
Called pt and informed him of recommendations. He stated that the cream was for the spot on the side of his nose. He said that the spot comes and goes and wanted to know if you wanted him to continue to use the cream. Will fwd to pcp for advice.Maryruth Eve, [place]

## 2015-07-26 NOTE — Telephone Encounter (Signed)
Needs to use the aldara for 6 weeks and then can stop.  It usually creates a raw sore spot on the skin.

## 2015-07-26 NOTE — Telephone Encounter (Signed)
He is ok to stop the shots.  Only take them is he really feels they are improving his enrergy, strength ,et c. I am not sure which cream is concerned about. We have not seen the cream to the pharmacy for him in a most year.

## 2015-08-01 ENCOUNTER — Ambulatory Visit: Payer: Commercial Managed Care - HMO

## 2015-08-01 NOTE — Telephone Encounter (Signed)
Called pt and informed him of recommendations. He stated that the med was to expensive and he never picked it up and would like for something else to be sent.Audelia Hives [place]

## 2015-08-01 NOTE — Addendum Note (Signed)
Addended by: [name] on: 08/01/2015 12:22 PM   Modules accepted: Orders

## 2015-08-01 NOTE — Telephone Encounter (Signed)
Pt advised he would like to proceed with referral to derm either [place] or [place].Jerome Hicks [place]

## 2015-08-01 NOTE — Telephone Encounter (Signed)
That is the only med that works.  O/W it will have to be cut off. WE can refer to dermatology if he would like

## 2015-08-19 ENCOUNTER — Telehealth: Payer: Self-pay | Admitting: Family Medicine

## 2015-08-19 MED ORDER — POTASSIUM CHLORIDE CRYS ER 10 MEQ PO TBCR: EXTENDED_RELEASE_TABLET | ORAL | Status: DC

## 2015-08-19 MED ORDER — TRAMADOL HCL 50 MG PO TABS: 50.0000 mg | ORAL_TABLET | Freq: Three times a day (TID) | ORAL | Status: DC | PRN

## 2015-08-19 MED ORDER — FUROSEMIDE 40 MG PO TABS: ORAL_TABLET | ORAL | Status: DC

## 2015-08-19 NOTE — Telephone Encounter (Signed)
Sent to pharmacy 

## 2015-08-19 NOTE — Telephone Encounter (Signed)
Pt is needing his meds called in for refill  Potassium cler 43meq Micro Tab  And Furosemide 40 MG Tab  And Tramadol  HCL 50mg  Tab   THANKS

## 2015-08-31 ENCOUNTER — Ambulatory Visit (INDEPENDENT_AMBULATORY_CARE_PROVIDER_SITE_OTHER): Payer: Commercial Managed Care - HMO | Admitting: Family Medicine

## 2015-08-31 DIAGNOSIS — Z23 Encounter for immunization: Secondary | ICD-10-CM

## 2015-08-31 NOTE — Progress Notes (Signed)
Patient came into clinic today for flu vaccination. Patient was given a flu shot questionnaire prior to administration of immunization. All questions were answered no. Patient tolerated injection of flu immunization in Left deltoid well, with no immediate complications. An information pamphlet regarding flu shot immunization and frequently asked questions was given to Patient prior to leaving clinic. Advised to contact our office with any questions/concerns.

## 2015-10-11 ENCOUNTER — Other Ambulatory Visit: Payer: Self-pay

## 2015-10-11 MED ORDER — LISINOPRIL 10 MG PO TABS: 10.0000 mg | ORAL_TABLET | Freq: Every day | ORAL | Status: DC

## 2015-10-14 ENCOUNTER — Other Ambulatory Visit: Payer: Self-pay | Admitting: Family Medicine

## 2015-10-14 ENCOUNTER — Telehealth: Payer: Self-pay | Admitting: Family Medicine

## 2015-10-14 MED ORDER — LISINOPRIL 10 MG PO TABS: 10.0000 mg | ORAL_TABLET | Freq: Every day | ORAL | Status: DC

## 2015-10-14 NOTE — Telephone Encounter (Signed)
Ok to sent to [facility].

## 2015-10-14 NOTE — Telephone Encounter (Signed)
Pt called to check on his Lisinopril. I told him it was sent electronically to [facility] he said it should've been sent to [facility].  All scripts except Eloquis([facility]) should be sent to [facility]

## 2015-10-17 ENCOUNTER — Other Ambulatory Visit: Payer: Self-pay

## 2015-10-17 MED ORDER — TRAMADOL HCL 50 MG PO TABS: 50.0000 mg | ORAL_TABLET | Freq: Three times a day (TID) | ORAL | Status: DC | PRN

## 2015-10-17 MED ORDER — FUROSEMIDE 40 MG PO TABS
ORAL_TABLET | ORAL | Status: DC
Start: 2015-10-17 — End: 2016-02-29

## 2015-10-17 MED ORDER — POTASSIUM CHLORIDE CRYS ER 10 MEQ PO TBCR: EXTENDED_RELEASE_TABLET | ORAL | Status: DC

## 2015-10-20 ENCOUNTER — Other Ambulatory Visit: Payer: Self-pay | Admitting: *Deleted

## 2015-10-20 MED ORDER — APIXABAN 5 MG PO TABS: 5.0000 mg | ORAL_TABLET | Freq: Two times a day (BID) | ORAL | Status: DC

## 2015-11-09 ENCOUNTER — Ambulatory Visit (INDEPENDENT_AMBULATORY_CARE_PROVIDER_SITE_OTHER): Payer: Commercial Managed Care - HMO | Admitting: Family Medicine

## 2015-11-09 ENCOUNTER — Encounter: Payer: Self-pay | Admitting: Family Medicine

## 2015-11-09 VITALS — BP 139/72 | HR 81 | Temp 98.1°F | Resp 18 | Wt 279.9 lb

## 2015-11-09 DIAGNOSIS — J029 Acute pharyngitis, unspecified: Secondary | ICD-10-CM

## 2015-11-09 DIAGNOSIS — N3000 Acute cystitis without hematuria: Secondary | ICD-10-CM

## 2015-11-09 DIAGNOSIS — D239 Other benign neoplasm of skin, unspecified: Secondary | ICD-10-CM

## 2015-11-09 DIAGNOSIS — D229 Melanocytic nevi, unspecified: Secondary | ICD-10-CM

## 2015-11-09 DIAGNOSIS — R300 Dysuria: Secondary | ICD-10-CM | POA: Diagnosis not present

## 2015-11-09 LAB — POCT URINALYSIS DIPSTICK
Bilirubin, UA: NEGATIVE
Glucose, UA: NEGATIVE
Ketones, UA: NEGATIVE
Leukocytes, UA: NEGATIVE
NITRITE UA: NEGATIVE
PH UA: 7
PROTEIN UA: NEGATIVE
RBC UA: NEGATIVE
Spec Grav, UA: 1.015
UROBILINOGEN UA: 0.2

## 2015-11-09 LAB — POCT RAPID STREP A (OFFICE): RAPID STREP A SCREEN: NEGATIVE

## 2015-11-09 MED ORDER — CIPROFLOXACIN HCL 500 MG PO TABS: 500.0000 mg | ORAL_TABLET | Freq: Two times a day (BID) | ORAL | Status: AC

## 2015-11-09 NOTE — Addendum Note (Signed)
Addended by: Beatrice Lecher D on: 11/09/2015 09:53 AM   Modules accepted: Orders

## 2015-11-09 NOTE — Progress Notes (Signed)
   Subjective:    Patient ID: Jerome Hicks, male    DOB: 05-30-1951, 64 y.o.   MRN: [id number]  HPI Patient comes in today complaining of 1 week of sore throat and bodyaches. He has been taking over-the-counter Coricidin which helped initially but now doesn't seem to be effective. No nasal congestion. No cough. No fevers or chills. He does report some burning with urination intermittently over the last week as well.  No GI sxs. No hematuria. Not sexually active.    Review of Systems     Objective:   Physical Exam  Constitutional: He is oriented to person, place, and time. He appears well-developed and well-nourished.  HENT:  Head: Normocephalic and atraumatic.  Right Ear: External ear normal.  Left Ear: External ear normal.  Nose: Nose normal.  Mouth/Throat: Oropharynx is clear and moist.  TMs and canals are clear.   Eyes: Conjunctivae and EOM are normal. Pupils are equal, round, and reactive to light.  Neck: Neck supple. No thyromegaly present.  Cardiovascular: Normal rate and normal heart sounds.   Pulmonary/Chest: Effort normal and breath sounds normal.  Lymphadenopathy:    He has no cervical adenopathy.  Neurological: He is alert and oriented to person, place, and time.  Skin: Skin is warm and dry.  Psychiatric: He has a normal mood and affect.          Assessment & Plan:  Viral pharyngitis - likely viral. Strep test was negative. Gave reassurance. Continue was sent to Medicare. Next  UTI versus prostatitis-urinalysis was negative and can go ahead and place him on Cipro he has had a history of prostatitis before. We'll treat for 10 days. If not better after the weekend and please give [place] a call back. Will send urine for culture for further analysis.

## 2015-11-09 NOTE — Addendum Note (Signed)
Addended by: Elizabeth Sauer on: 11/09/2015 01:18 PM   Modules accepted: Orders

## 2015-11-12 LAB — URINE CULTURE
COLONY COUNT: NO GROWTH
ORGANISM ID, BACTERIA: NO GROWTH

## 2015-11-29 ENCOUNTER — Telehealth: Payer: Self-pay | Admitting: Family Medicine

## 2015-11-29 NOTE — Telephone Encounter (Signed)
I believe that we do. At least enough to get him through January 1. What I worry about is that he may still have a deductible at the beginning of the year but he will have to call his insurance to find that out.

## 2015-11-29 NOTE — Telephone Encounter (Signed)
Patient called and wanted to know IF we have samples of Eliquis because his insurance is messed-up right now and he can not afford it until after the new year. Discount cards will not work with his medicare since he's already used one this year.

## 2015-11-29 NOTE — Telephone Encounter (Signed)
Pt advised samples are ready for pick up. We only have one month supply as we do not carry this Rx sample. Pt will come pick up today.

## 2015-12-02 ENCOUNTER — Telehealth: Payer: Self-pay | Admitting: Family Medicine

## 2015-12-02 MED ORDER — DOXAZOSIN MESYLATE 4 MG PO TABS: 4.0000 mg | ORAL_TABLET | Freq: Every day | ORAL | Status: DC

## 2015-12-02 NOTE — Telephone Encounter (Addendum)
Pt called. He wants refill on Doxazosin-4mg  tabs(only a week's supply left). Please send script to [facility] Thank you.

## 2015-12-08 DIAGNOSIS — L821 Other seborrheic keratosis: Secondary | ICD-10-CM | POA: Diagnosis not present

## 2015-12-08 DIAGNOSIS — C44311 Basal cell carcinoma of skin of nose: Secondary | ICD-10-CM | POA: Diagnosis not present

## 2015-12-14 ENCOUNTER — Telehealth: Payer: Self-pay | Admitting: Family Medicine

## 2015-12-14 NOTE — Telephone Encounter (Signed)
Received fax for prior authorization on Eliquis filled out form and placed in Dr. Gardiner Ramus box for signature. - CF

## 2015-12-16 NOTE — Telephone Encounter (Signed)
Received fax from [employer] is approved for tier exception until 12/02/2016.  Patient has been notified. - CF

## 2015-12-21 DIAGNOSIS — H25813 Combined forms of age-related cataract, bilateral: Secondary | ICD-10-CM | POA: Diagnosis not present

## 2015-12-21 DIAGNOSIS — H5210 Myopia, unspecified eye: Secondary | ICD-10-CM | POA: Diagnosis not present

## 2015-12-21 DIAGNOSIS — H5213 Myopia, bilateral: Secondary | ICD-10-CM | POA: Diagnosis not present

## 2015-12-26 ENCOUNTER — Ambulatory Visit (INDEPENDENT_AMBULATORY_CARE_PROVIDER_SITE_OTHER): Payer: Commercial Managed Care - HMO | Admitting: Physician Assistant

## 2015-12-26 ENCOUNTER — Encounter: Payer: Self-pay | Admitting: Physician Assistant

## 2015-12-26 VITALS — BP 109/68 | HR 59 | Temp 98.2°F | Ht 74.0 in | Wt 282.0 lb

## 2015-12-26 DIAGNOSIS — R05 Cough: Secondary | ICD-10-CM | POA: Diagnosis not present

## 2015-12-26 DIAGNOSIS — J069 Acute upper respiratory infection, unspecified: Secondary | ICD-10-CM

## 2015-12-26 DIAGNOSIS — R062 Wheezing: Secondary | ICD-10-CM

## 2015-12-26 DIAGNOSIS — R059 Cough, unspecified: Secondary | ICD-10-CM

## 2015-12-26 MED ORDER — IPRATROPIUM-ALBUTEROL 0.5-2.5 (3) MG/3ML IN SOLN: 3.0000 mL | Freq: Four times a day (QID) | RESPIRATORY_TRACT | Status: DC

## 2015-12-26 MED ORDER — AZITHROMYCIN 250 MG PO TABS: ORAL_TABLET | ORAL | Status: DC

## 2015-12-26 MED ORDER — HYDROCODONE-HOMATROPINE 5-1.5 MG/5ML PO SYRP: 5.0000 mL | ORAL_SOLUTION | Freq: Every evening | ORAL | Status: DC | PRN

## 2015-12-26 MED ORDER — METHYLPREDNISOLONE SODIUM SUCC 125 MG IJ SOLR
125.0000 mg | Freq: Once | INTRAMUSCULAR | Status: AC
  Administered 2015-12-26: 125 mg via INTRAMUSCULAR

## 2015-12-26 NOTE — Progress Notes (Signed)
   Subjective:    Patient ID: Jerome Hicks, male    DOB: Jan 20, 1951, 65 y.o.   MRN: [id number]  HPI Pt presents to the clinic with 5 days of sinus pressure, cough that is productive, chest tightness, headache. Cough is worse at bedtime. No fever, chills or body aches. Tried coriciden and mucinex with little relief. No hx of asthma or lung disease.   Review of Systems  All other systems reviewed and are negative.      Objective:   Physical Exam  Constitutional: He is oriented to person, place, and time. He appears well-developed and well-nourished.  HENT:  Head: Normocephalic and atraumatic.  Right Ear: External ear normal.  Left Ear: External ear normal.  Mouth/Throat: Oropharynx is clear and moist. No oropharyngeal exudate.  TM's clear.  Tenderness to palpation over bilateral maxillary sinuses to palpation.  Bilateral nasal turbinates and red and swollen.   Eyes: Conjunctivae are normal. Right eye exhibits no discharge. Left eye exhibits no discharge.  Neck: Normal range of motion. Neck supple.  Cardiovascular: Normal rate, regular rhythm and normal heart sounds.   Pulmonary/Chest: Effort normal.  Scattered wheezing at bilateral base of lungs.   Lymphadenopathy:    He has no cervical adenopathy.  Neurological: He is alert and oriented to person, place, and time.  Psychiatric: He has a normal mood and affect. His behavior is normal.          Assessment & Plan:  Acute upper respiratory infection/wheezing- duoneb given in office today. Solumedrol 125mg  IM given today. Hycodan for cough at bedtime. Zpak. Discussed other symptomatic care.   To note: Nurse gave solumedrol injection in left deltoid. She was unaware needed to go into gluteus. Will monitor patient.

## 2015-12-26 NOTE — Patient Instructions (Signed)
Upper Respiratory Infection, Adult Most upper respiratory infections (URIs) are a viral infection of the air passages leading to the lungs. A URI affects the nose, throat, and upper air passages. The most common type of URI is nasopharyngitis and is typically referred to as "the common cold." URIs run their course and usually go away on their own. Most of the time, a URI does not require medical attention, but sometimes a bacterial infection in the upper airways can follow a viral infection. This is called a secondary infection. Sinus and middle ear infections are common types of secondary upper respiratory infections. Bacterial pneumonia can also complicate a URI. A URI can worsen asthma and chronic obstructive pulmonary disease (COPD). Sometimes, these complications can require emergency medical care and may be life threatening.  CAUSES Almost all URIs are caused by viruses. A virus is a type of germ and can spread from one person to another.  RISKS FACTORS You may be at risk for a URI if:   You smoke.   You have chronic heart or lung disease.  You have a weakened defense (immune) system.   You are very young or very old.   You have nasal allergies or asthma.  You work in crowded or poorly ventilated areas.  You work in health care facilities or schools. SIGNS AND SYMPTOMS  Symptoms typically develop 2-3 days after you come in contact with a cold virus. Most viral URIs last 7-10 days. However, viral URIs from the influenza virus (flu virus) can last 14-18 days and are typically more severe. Symptoms may include:   Runny or stuffy (congested) nose.   Sneezing.   Cough.   Sore throat.   Headache.   Fatigue.   Fever.   Loss of appetite.   Pain in your forehead, behind your eyes, and over your cheekbones (sinus pain).  Muscle aches.  DIAGNOSIS  Your health care provider may diagnose a URI by:  Physical exam.  Tests to check that your symptoms are not due to  another condition such as:  Strep throat.  Sinusitis.  Pneumonia.  Asthma. TREATMENT  A URI goes away on its own with time. It cannot be cured with medicines, but medicines may be prescribed or recommended to relieve symptoms. Medicines may help:  Reduce your fever.  Reduce your cough.  Relieve nasal congestion. HOME CARE INSTRUCTIONS   Take medicines only as directed by your health care provider.   Gargle warm saltwater or take cough drops to comfort your throat as directed by your health care provider.  Use a warm mist humidifier or inhale steam from a shower to increase air moisture. This may make it easier to breathe.  Drink enough fluid to keep your urine clear or pale yellow.   Eat soups and other clear broths and maintain good nutrition.   Rest as needed.   Return to work when your temperature has returned to normal or as your health care provider advises. You may need to stay home longer to avoid infecting others. You can also use a face mask and careful hand washing to prevent spread of the virus.  Increase the usage of your inhaler if you have asthma.   Do not use any tobacco products, including cigarettes, chewing tobacco, or electronic cigarettes. If you need help quitting, ask your health care provider. PREVENTION  The best way to protect yourself from getting a cold is to practice good hygiene.   Avoid oral or hand contact with people with cold   symptoms.   Wash your hands often if contact occurs.  There is no clear evidence that vitamin C, vitamin E, echinacea, or exercise reduces the chance of developing a cold. However, it is always recommended to get plenty of rest, exercise, and practice good nutrition.  SEEK MEDICAL CARE IF:   You are getting worse rather than better.   Your symptoms are not controlled by medicine.   You have chills.  You have worsening shortness of breath.  You have brown or red mucus.  You have yellow or brown nasal  discharge.  You have pain in your face, especially when you bend forward.  You have a fever.  You have swollen neck glands.  You have pain while swallowing.  You have white areas in the back of your throat. SEEK IMMEDIATE MEDICAL CARE IF:   You have severe or persistent:  Headache.  Ear pain.  Sinus pain.  Chest pain.  You have chronic lung disease and any of the following:  Wheezing.  Prolonged cough.  Coughing up blood.  A change in your usual mucus.  You have a stiff neck.  You have changes in your:  Vision.  Hearing.  Thinking.  Mood. MAKE SURE YOU:   Understand these instructions.  Will watch your condition.  Will get help right away if you are not doing well or get worse.   This information is not intended to replace advice given to you by your health care provider. Make sure you discuss any questions you have with your health care provider.   Document Released: 05/15/2001 Document Revised: 04/05/2015 Document Reviewed: 02/24/2014 Elsevier Interactive Patient Education 2016 [employer]  

## 2015-12-29 ENCOUNTER — Ambulatory Visit (INDEPENDENT_AMBULATORY_CARE_PROVIDER_SITE_OTHER): Payer: Commercial Managed Care - HMO | Admitting: Family Medicine

## 2015-12-29 ENCOUNTER — Encounter: Payer: Self-pay | Admitting: Family Medicine

## 2015-12-29 VITALS — BP 126/69 | HR 98 | Ht 74.0 in | Wt 292.0 lb

## 2015-12-29 DIAGNOSIS — R05 Cough: Secondary | ICD-10-CM

## 2015-12-29 DIAGNOSIS — R062 Wheezing: Secondary | ICD-10-CM

## 2015-12-29 DIAGNOSIS — C44311 Basal cell carcinoma of skin of nose: Secondary | ICD-10-CM | POA: Diagnosis not present

## 2015-12-29 DIAGNOSIS — I482 Chronic atrial fibrillation, unspecified: Secondary | ICD-10-CM

## 2015-12-29 DIAGNOSIS — I10 Essential (primary) hypertension: Secondary | ICD-10-CM | POA: Diagnosis not present

## 2015-12-29 DIAGNOSIS — J019 Acute sinusitis, unspecified: Secondary | ICD-10-CM | POA: Diagnosis not present

## 2015-12-29 DIAGNOSIS — R059 Cough, unspecified: Secondary | ICD-10-CM

## 2015-12-29 DIAGNOSIS — J209 Acute bronchitis, unspecified: Secondary | ICD-10-CM | POA: Diagnosis not present

## 2015-12-29 DIAGNOSIS — N289 Disorder of kidney and ureter, unspecified: Secondary | ICD-10-CM

## 2015-12-29 LAB — BASIC METABOLIC PANEL WITH GFR
BUN: 28 mg/dL — AB (ref 7–25)
CHLORIDE: 103 mmol/L (ref 98–110)
CO2: 27 mmol/L (ref 20–31)
CREATININE: 1.28 mg/dL — AB (ref 0.70–1.25)
Calcium: 8.5 mg/dL — ABNORMAL LOW (ref 8.6–10.3)
GFR, EST AFRICAN AMERICAN: 67 mL/min (ref >=60)
GFR, Est Non African American: 58 mL/min — ABNORMAL LOW (ref >=60)
GLUCOSE: 106 mg/dL — AB (ref 65–99)
Potassium: 4 mmol/L (ref 3.5–5.3)
Sodium: 138 mmol/L (ref 135–146)

## 2015-12-29 MED ORDER — ALBUTEROL SULFATE (2.5 MG/3ML) 0.083% IN NEBU
2.5000 mg | INHALATION_SOLUTION | Freq: Once | RESPIRATORY_TRACT | Status: AC
  Administered 2015-12-29: 2.5 mg via RESPIRATORY_TRACT

## 2015-12-29 MED ORDER — ALBUTEROL SULFATE (2.5 MG/3ML) 0.083% IN NEBU
2.5000 mg | INHALATION_SOLUTION | Freq: Once | RESPIRATORY_TRACT | Status: AC
Start: 2015-12-29 — End: 2015-12-29
  Administered 2015-12-29: 2.5 mg via RESPIRATORY_TRACT

## 2015-12-29 MED ORDER — IPRATROPIUM-ALBUTEROL 0.5-2.5 (3) MG/3ML IN SOLN: 3.0000 mL | Freq: Once | RESPIRATORY_TRACT | Status: DC

## 2015-12-29 MED ORDER — PREDNISONE 20 MG PO TABS: 40.0000 mg | ORAL_TABLET | Freq: Every day | ORAL | Status: DC

## 2015-12-29 MED ORDER — LEVOFLOXACIN 500 MG PO TABS: 500.0000 mg | ORAL_TABLET | Freq: Every day | ORAL | Status: AC

## 2015-12-29 NOTE — Patient Instructions (Signed)
Do not take losartan if you have it at home.

## 2015-12-29 NOTE — Addendum Note (Signed)
Addended by: Beatrice Lecher D on: 12/29/2015 09:01 AM   Modules accepted: Orders

## 2015-12-29 NOTE — Progress Notes (Signed)
   Subjective:    Patient ID: Jerome Hicks, male    DOB: 03-10-1951, 65 y.o.   MRN: [id number]  HPI Hypertension- Pt denies chest pain, SOB, dizziness, or heart palpitations.  Taking meds as directed w/o problems.  Denies medication side effects.    He was seen about 4 days ago for bronchitis in a sinus infection. He says he feels like he still is not getting better at all. He still feels very congested in his chest and says he feels that he can't move the phlegm out. He is still felt short of breath and has been wheezing. He his sinuses are still clogged and he has a lot of facial pressure. No fevers chills or sweats. He completed a Z-Pak.  Atrial fibrillation-he is on Eloquist for anticoagulation and on carvedilol for rate control. He is doing well overall. Has not had any excessive bleeding or problems with heart flutters or palpitations.  Review of Systems     Objective:   Physical Exam  Constitutional: He is oriented to person, place, and time. He appears well-developed and well-nourished.  HENT:  Head: Normocephalic and atraumatic.  Right Ear: External ear normal.  Left Ear: External ear normal.  Nose: Nose normal.  Mouth/Throat: Oropharynx is clear and moist.  TMs and canals are clear.   Eyes: Conjunctivae and EOM are normal. Pupils are equal, round, and reactive to light.  Neck: Neck supple. No thyromegaly present.  Cardiovascular: Normal rate and normal heart sounds.   Pulmonary/Chest: Effort normal. He has wheezes.  Diffuse rhonchi and wheezing.    Lymphadenopathy:    He has no cervical adenopathy.  Neurological: He is alert and oriented to person, place, and time.  Skin: Skin is warm and dry.  Psychiatric: He has a normal mood and affect.          Assessment & Plan:  HTN - well controlled. Continue current regimen.   acute sinusitis/bronchitis-Will start Levaquin. Given 5 days of oral prednisone. Given sample pro-air albuterol inhaler. Given 2  nebulizer  treatments here in the office.  Atrial fibrillation-he needs current regimen. Doing well. Asymptomatic.

## 2015-12-30 ENCOUNTER — Ambulatory Visit: Payer: Commercial Managed Care - HMO | Admitting: Family Medicine

## 2015-12-30 NOTE — Addendum Note (Signed)
Addended by: Huel Cote on: 12/30/2015 04:37 PM   Modules accepted: Orders

## 2016-01-12 DIAGNOSIS — N289 Disorder of kidney and ureter, unspecified: Secondary | ICD-10-CM | POA: Diagnosis not present

## 2016-01-13 LAB — PTH, INTACT AND CALCIUM
Calcium: 8.6 mg/dL (ref 8.4–10.5)
PTH: 60 pg/mL (ref 14–64)

## 2016-01-13 LAB — COMPLETE METABOLIC PANEL WITH GFR
ALBUMIN: 3.4 g/dL — AB (ref 3.6–5.1)
ALK PHOS: 53 U/L (ref 40–115)
ALT: 17 U/L (ref 9–46)
AST: 18 U/L (ref 10–35)
BILIRUBIN TOTAL: 2.8 mg/dL — AB (ref 0.2–1.2)
BUN: 21 mg/dL (ref 7–25)
CALCIUM: 8.6 mg/dL (ref 8.6–10.3)
CO2: 30 mmol/L (ref 20–31)
CREATININE: 1.08 mg/dL (ref 0.70–1.25)
Chloride: 102 mmol/L (ref 98–110)
GFR, EST AFRICAN AMERICAN: 83 mL/min (ref >=60)
GFR, EST NON AFRICAN AMERICAN: 72 mL/min (ref >=60)
Glucose, Bld: 149 mg/dL — ABNORMAL HIGH (ref 65–99)
Potassium: 4 mmol/L (ref 3.5–5.3)
Sodium: 140 mmol/L (ref 135–146)
TOTAL PROTEIN: 6 g/dL — AB (ref 6.1–8.1)

## 2016-02-03 ENCOUNTER — Other Ambulatory Visit: Payer: Self-pay | Admitting: Family Medicine

## 2016-02-06 ENCOUNTER — Other Ambulatory Visit: Payer: Self-pay

## 2016-02-06 MED ORDER — CARVEDILOL 3.125 MG PO TABS: 3.1250 mg | ORAL_TABLET | Freq: Two times a day (BID) | ORAL | Status: DC

## 2016-02-28 ENCOUNTER — Telehealth: Payer: Self-pay | Admitting: Family Medicine

## 2016-02-28 NOTE — Telephone Encounter (Signed)
Patient called stating he got a reminder call from an automated machine that he needed to schedule a colonoscopy. I adv him that could of been his insurance company because we do not call to tell patients via our automated that patient's need to schedule those. Thanks

## 2016-02-28 NOTE — Telephone Encounter (Signed)
We will be due for repeat colonoscopy in June. It was most likely the GI office that contacted him. Please find out where he went previously and we can place an order for him if he is ready to schedule his scope for the summer.

## 2016-02-29 ENCOUNTER — Other Ambulatory Visit: Payer: Self-pay | Admitting: Family Medicine

## 2016-02-29 NOTE — Telephone Encounter (Signed)
Pt informed he will be due in June. Pt states he got his last colonoscopy at [facility] but doesn't remember the Doctors name.

## 2016-03-01 ENCOUNTER — Other Ambulatory Visit: Payer: Self-pay | Admitting: *Deleted

## 2016-03-01 MED ORDER — FUROSEMIDE 40 MG PO TABS: 40.0000 mg | ORAL_TABLET | Freq: Every day | ORAL | Status: DC | PRN

## 2016-03-01 MED ORDER — POTASSIUM CHLORIDE CRYS ER 10 MEQ PO TBCR: 10.0000 meq | EXTENDED_RELEASE_TABLET | Freq: Every day | ORAL | Status: DC

## 2016-03-12 ENCOUNTER — Other Ambulatory Visit: Payer: Self-pay | Admitting: Family Medicine

## 2016-03-19 ENCOUNTER — Other Ambulatory Visit: Payer: Self-pay

## 2016-03-19 MED ORDER — APIXABAN 5 MG PO TABS: 5.0000 mg | ORAL_TABLET | Freq: Two times a day (BID) | ORAL | Status: DC

## 2016-04-05 ENCOUNTER — Telehealth: Payer: Self-pay | Admitting: Family Medicine

## 2016-04-05 ENCOUNTER — Other Ambulatory Visit: Payer: Self-pay | Admitting: Family Medicine

## 2016-04-05 MED ORDER — LISINOPRIL 10 MG PO TABS: 10.0000 mg | ORAL_TABLET | Freq: Every day | ORAL | Status: DC

## 2016-04-05 NOTE — Telephone Encounter (Signed)
Patient called advised he needs a new prescription for Lisinopril sent to [facility] he has enough to last a week. Thanks

## 2016-04-05 NOTE — Telephone Encounter (Signed)
Rx refilled.

## 2016-04-10 ENCOUNTER — Telehealth: Payer: Self-pay | Admitting: Physician Assistant

## 2016-04-10 MED ORDER — TRAMADOL HCL 50 MG PO TABS: 50.0000 mg | ORAL_TABLET | Freq: Three times a day (TID) | ORAL | Status: DC | PRN

## 2016-04-10 NOTE — Telephone Encounter (Signed)
Pt called clinic requesting refill on Tramadol be sent to mail order. Will refill.

## 2016-04-18 ENCOUNTER — Other Ambulatory Visit: Payer: Self-pay | Admitting: Family Medicine

## 2016-04-18 ENCOUNTER — Other Ambulatory Visit: Payer: Self-pay

## 2016-04-18 MED ORDER — DOXAZOSIN MESYLATE 4 MG PO TABS: 4.0000 mg | ORAL_TABLET | Freq: Every day | ORAL | Status: DC

## 2016-04-19 ENCOUNTER — Encounter: Payer: Self-pay | Admitting: Sports Medicine

## 2016-04-19 ENCOUNTER — Other Ambulatory Visit: Payer: Self-pay | Admitting: Family Medicine

## 2016-04-19 ENCOUNTER — Ambulatory Visit (INDEPENDENT_AMBULATORY_CARE_PROVIDER_SITE_OTHER): Payer: Commercial Managed Care - HMO | Admitting: Sports Medicine

## 2016-04-19 VITALS — BP 129/71 | HR 96 | Resp 18 | Wt 298.1 lb

## 2016-04-19 DIAGNOSIS — M25511 Pain in right shoulder: Secondary | ICD-10-CM | POA: Diagnosis not present

## 2016-04-19 MED ORDER — MECLIZINE HCL 25 MG PO TABS: ORAL_TABLET | ORAL | Status: DC

## 2016-04-19 NOTE — Assessment & Plan Note (Signed)
Nearly one year response to previous injections, subacromial and glenohumeral, repeated today. Return as needed.

## 2016-04-19 NOTE — Addendum Note (Signed)
Addended by: Elizabeth Sauer on: 04/19/2016 02:02 PM   Modules accepted: Orders, Medications

## 2016-04-19 NOTE — Progress Notes (Signed)
  Subjective:    CC: Right shoulder pain  HPI: This is a pleasant 65 year old male, he is having a recurrence of pain in the shoulder, about 11 months ago we injected his subacromial bursa and glenohumeral joint, he did extremely well until recently.His moderate, persistent, desires repeat injection today.  Past medical history, Surgical history, Family history not pertinant except as noted below, Social history, Allergies, and medications have been entered into the medical record, reviewed, and no changes needed.   Review of Systems: No fevers, chills, night sweats, weight loss, chest pain, or shortness of breath.   Objective:    General: Well Developed, well nourished, and in no acute distress.  Neuro: Alert and oriented x3, extra-ocular muscles intact, sensation grossly intact.  HEENT: Normocephalic, atraumatic, pupils equal round reactive to light, neck supple, no masses, no lymphadenopathy, thyroid nonpalpable.  Skin: Warm and dry, no rashes. Cardiac: Regular rate and rhythm, no murmurs rubs or gallops, no lower extremity edema.  Respiratory: Clear to auscultation bilaterally. Not using accessory muscles, speaking in full sentences.  Procedure: Real-time Ultrasound Guided Injection of right subacromial bursa Device: GE Logiq E  Verbal informed consent obtained.  Time-out conducted.  Noted no overlying erythema, induration, or other signs of local infection.  Skin prepped in a sterile fashion.  Local anesthesia: Topical Ethyl chloride.  With sterile technique and under real time ultrasound guidance:   19mL lidocaine, 1 mL Marcaine, 1 mL kenalog 40 injected easily. Completed without difficulty  Pain immediately resolved suggesting accurate placement of the medication.  Advised to call if fevers/chills, erythema, induration, drainage, or persistent bleeding.  Images permanently stored and available for review in the ultrasound unit.  Impression: Technically successful ultrasound  guided injection.  Procedure: Real-time Ultrasound Guided Injection of right glenohumeral joint Device: GE Logiq E  Verbal informed consent obtained.  Time-out conducted.  Noted no overlying erythema, induration, or other signs of local infection.  Skin prepped in a sterile fashion.  Local anesthesia: Topical Ethyl chloride.  With sterile technique and under real time ultrasound guidance:   82mL lidocaine, 1 mL Marcaine, 1 mL kenalog 40 injected easily. Completed without difficulty  Pain immediately resolved suggesting accurate placement of the medication.  Advised to call if fevers/chills, erythema, induration, drainage, or persistent bleeding.  Images permanently stored and available for review in the ultrasound unit.  Impression: Technically successful ultrasound guided injection.  Impression and Recommendations:

## 2016-04-27 ENCOUNTER — Encounter: Payer: Self-pay | Admitting: Family Medicine

## 2016-04-27 ENCOUNTER — Ambulatory Visit (INDEPENDENT_AMBULATORY_CARE_PROVIDER_SITE_OTHER): Payer: Commercial Managed Care - HMO | Admitting: Family Medicine

## 2016-04-27 VITALS — BP 119/68 | HR 78 | Ht 74.0 in | Wt 290.0 lb

## 2016-04-27 DIAGNOSIS — N183 Chronic kidney disease, stage 3 unspecified: Secondary | ICD-10-CM

## 2016-04-27 DIAGNOSIS — I482 Chronic atrial fibrillation, unspecified: Secondary | ICD-10-CM

## 2016-04-27 DIAGNOSIS — R5383 Other fatigue: Secondary | ICD-10-CM

## 2016-04-27 DIAGNOSIS — Z1211 Encounter for screening for malignant neoplasm of colon: Secondary | ICD-10-CM

## 2016-04-27 DIAGNOSIS — I10 Essential (primary) hypertension: Secondary | ICD-10-CM

## 2016-04-27 DIAGNOSIS — Z6837 Body mass index (BMI) 37.0-37.9, adult: Secondary | ICD-10-CM | POA: Insufficient documentation

## 2016-04-27 NOTE — Progress Notes (Addendum)
Subjective:    CC: HTN  HPI: Hypertension- Pt denies chest pain, SOB, dizziness, or heart palpitations.  Taking meds as directed w/o problems.  Denies medication side effects.    Atrial fibrillation-currently on Eliquis for anticoagulation and carvedilol for rate control.  Follow-up chronic kidney disease-he recently had an episode of acute renal failure. Only last checked him in February kidney function was back down to baseline.  Also complains of fatigue over the last several months. He says he still very active but just gets tired. He says he denies any shortness of breath or chest pain. He really feels like he sleeps well and gets at least 8 hours every night.  Obesity/BMI 37-he is doing fantastic. He says he has cut out all bread and cut out all sugary beverages and has [name] lost 8 pounds in a week.  Past medical history, Surgical history, Family history not pertinant except as noted below, Social history, Allergies, and medications have been entered into the medical record, reviewed, and corrections made.   Review of Systems: No fevers, chills, night sweats, weight loss, chest pain, or shortness of breath.   Objective:    General: Well Developed, well nourished, and in no acute distress.  Neuro: Alert and oriented x3, extra-ocular muscles intact, sensation grossly intact.  HEENT: Normocephalic, atraumatic  Skin: Warm and dry, no rashes. Cardiac: Regular rate and rhythm, no murmurs rubs or gallops, no lower extremity edema.  Respiratory: Clear to auscultation bilaterally. Not using accessory muscles, speaking in full sentences.   Impression and Recommendations:    HTN = Well controlled. Continue current regimen. Follow up in 6 mo.   Atrial fibrillation-continue Eliquis and carvedilol.  Obesity-down 8 pounds from last office visit. He is working hard. Wants to get down to 200 lbs.    Fatigue - unclear etiology. We discussed trying an over-the-counter B12 complex. If he is  not noticing improvement after a month and please let me with no we can do some additional blood work including checking his thyroid evaluated for anemia etc.  CKD 3- recheck kidney function in 3 months.    Reminded him to has been 10 years and he is now due for his screening colonoscopy again.

## 2016-04-27 NOTE — Patient Instructions (Signed)
Can try a B complex daily for energy.

## 2016-04-27 NOTE — Addendum Note (Signed)
Addended by: Beatrice Lecher D on: 04/27/2016 12:24 PM   Modules accepted: Orders, SmartSet

## 2016-05-10 ENCOUNTER — Telehealth: Payer: Self-pay | Admitting: *Deleted

## 2016-05-10 ENCOUNTER — Encounter: Payer: Self-pay | Admitting: *Deleted

## 2016-05-10 NOTE — Telephone Encounter (Signed)
Left a message for [facility].

## 2016-05-10 NOTE — Telephone Encounter (Signed)
[facility] called and would like Dr Gardiner Ramus recommendations for stopping his Eliquis before his procedure.   Return call to [facility] [phone]

## 2016-05-10 NOTE — Telephone Encounter (Signed)
Stop Eloquist 2 days prior to surgery. So for example if he is having surgery on Wednesday he would skip taking his medicine on Monday and Tuesday.

## 2016-05-22 ENCOUNTER — Encounter: Payer: Self-pay | Admitting: Family Medicine

## 2016-05-22 DIAGNOSIS — Z1211 Encounter for screening for malignant neoplasm of colon: Secondary | ICD-10-CM | POA: Diagnosis not present

## 2016-05-22 DIAGNOSIS — D122 Benign neoplasm of ascending colon: Secondary | ICD-10-CM | POA: Diagnosis not present

## 2016-05-22 DIAGNOSIS — D123 Benign neoplasm of transverse colon: Secondary | ICD-10-CM | POA: Diagnosis not present

## 2016-05-25 LAB — HM COLONOSCOPY

## 2016-06-06 ENCOUNTER — Telehealth: Payer: Self-pay | Admitting: Family Medicine

## 2016-06-06 NOTE — Telephone Encounter (Signed)
Pt called back and stated that his insurance is going up on his Eliquis and wanted to know if this can be changed to something else. He has 2 wks left on current supply. Will fwd to pcp for advice.Jerome Hicks [place]

## 2016-06-06 NOTE — Telephone Encounter (Signed)
Jerome Hicks,  Patient is requesting you call him today at [phone]. Re: Meds

## 2016-06-06 NOTE — Telephone Encounter (Signed)
Returned pt's call lvm for rtn call.Jerome Hicks [place]

## 2016-06-07 ENCOUNTER — Encounter: Payer: Self-pay | Admitting: Family Medicine

## 2016-06-07 NOTE — Telephone Encounter (Signed)
He is going to have to call the 1-800 number on the card to see what would be covered in its place. There are several in the options.

## 2016-06-07 NOTE — Telephone Encounter (Signed)
Called pt and advised that he call his insurance company to see what's covered. Pt voiced understanding and agreed.Jerome Hicks

## 2016-08-28 ENCOUNTER — Ambulatory Visit (INDEPENDENT_AMBULATORY_CARE_PROVIDER_SITE_OTHER): Payer: Commercial Managed Care - HMO | Admitting: Family Medicine

## 2016-08-28 ENCOUNTER — Encounter: Payer: Self-pay | Admitting: Family Medicine

## 2016-08-28 VITALS — BP 139/84 | HR 75 | Ht 74.0 in | Wt 294.0 lb

## 2016-08-28 DIAGNOSIS — R5383 Other fatigue: Secondary | ICD-10-CM

## 2016-08-28 DIAGNOSIS — Z23 Encounter for immunization: Secondary | ICD-10-CM | POA: Diagnosis not present

## 2016-08-28 DIAGNOSIS — N183 Chronic kidney disease, stage 3 unspecified: Secondary | ICD-10-CM

## 2016-08-28 DIAGNOSIS — I10 Essential (primary) hypertension: Secondary | ICD-10-CM

## 2016-08-28 DIAGNOSIS — Z1321 Encounter for screening for nutritional disorder: Secondary | ICD-10-CM | POA: Diagnosis not present

## 2016-08-28 DIAGNOSIS — M791 Myalgia: Secondary | ICD-10-CM | POA: Diagnosis not present

## 2016-08-28 DIAGNOSIS — M609 Myositis, unspecified: Secondary | ICD-10-CM | POA: Diagnosis not present

## 2016-08-28 LAB — CBC WITH DIFFERENTIAL/PLATELET
Basophils Absolute: 65 cells/uL (ref 0–200)
Basophils Relative: 1 %
EOS PCT: 2 %
Eosinophils Absolute: 130 cells/uL (ref 15–500)
HEMATOCRIT: 40.1 % (ref 38.5–50.0)
Hemoglobin: 13.5 g/dL (ref 13.2–17.1)
LYMPHS PCT: 28 %
Lymphs Abs: 1820 cells/uL (ref 850–3900)
MCH: 30.9 pg (ref 27.0–33.0)
MCHC: 33.7 g/dL (ref 32.0–36.0)
MCV: 91.8 fL (ref 80.0–100.0)
MONOS PCT: 8 %
MPV: 10.2 fL (ref 7.5–12.5)
Monocytes Absolute: 520 cells/uL (ref 200–950)
NEUTROS PCT: 61 %
Neutro Abs: 3965 cells/uL (ref 1500–7800)
Platelets: 212 10*3/uL (ref 140–400)
RBC: 4.37 MIL/uL (ref 4.20–5.80)
RDW: 12.5 % (ref 11.0–15.0)
WBC: 6.5 10*3/uL (ref 3.8–10.8)

## 2016-08-28 LAB — COMPLETE METABOLIC PANEL WITH GFR
ALBUMIN: 3.9 g/dL (ref 3.6–5.1)
ALK PHOS: 50 U/L (ref 40–115)
ALT: 18 U/L (ref 9–46)
AST: 21 U/L (ref 10–35)
BUN: 13 mg/dL (ref 7–25)
CALCIUM: 9.2 mg/dL (ref 8.6–10.3)
CO2: 26 mmol/L (ref 20–31)
Chloride: 105 mmol/L (ref 98–110)
Creat: 1.01 mg/dL (ref 0.70–1.25)
GFR, Est African American: 89 mL/min (ref >=60)
GFR, Est Non African American: 77 mL/min (ref >=60)
Glucose, Bld: 109 mg/dL — ABNORMAL HIGH (ref 65–99)
POTASSIUM: 4 mmol/L (ref 3.5–5.3)
Sodium: 141 mmol/L (ref 135–146)
Total Bilirubin: 2.7 mg/dL — ABNORMAL HIGH (ref 0.2–1.2)
Total Protein: 6.1 g/dL (ref 6.1–8.1)

## 2016-08-28 LAB — CK: Total CK: 155 U/L (ref 7–232)

## 2016-08-28 LAB — VITAMIN B12: Vitamin B-12: 371 pg/mL (ref 200–1100)

## 2016-08-28 LAB — TSH: TSH: 2.9 mIU/L (ref 0.40–4.50)

## 2016-08-28 LAB — FERRITIN: FERRITIN: 268 ng/mL (ref 20–380)

## 2016-08-28 NOTE — Progress Notes (Signed)
Subjective:    CC: HTN  HPI:  Hypertension-  Pt denies chest pain, SOB, dizziness, or heart palpitations.  Taking meds as directed w/o problems.  Denies medication side effects.    CKD 3 - No recent change in urination.  He does complain of feeling weak and achy all over. He says it's been going on for about a month. He is also been getting a throbbing sensation in his legs which is a little unusual. He says these been really good about wearing his compression stockings. No recent chest pain palpitations or shortness of breath. He feels like he is sleeping well and denies any excessive snoring. He denies any recent upper respiratory infections or fevers.  Past medical history, Surgical history, Family history not pertinant except as noted below, Social history, Allergies, and medications have been entered into the medical record, reviewed, and corrections made.   Review of Systems: No fevers, chills, night sweats, weight loss, chest pain, or shortness of breath.   Objective:    General: Well Developed, well nourished, and in no acute distress.  Neuro: Alert and oriented x3, extra-ocular muscles intact, sensation grossly intact.  HEENT: Normocephalic, atraumatic  Skin: Warm and dry, no rashes. Cardiac: Regular rate and rhythm, no murmurs rubs or gallops, no lower extremity edema.  Respiratory: Clear to auscultation bilaterally. Not using accessory muscles, speaking in full sentences.   Impression and Recommendations:    HTN - Well controlled. Continue current regimen. Follow up in  6 mo.   CKD 3 - due to recheck kidney function  Fatigue  - Unclear etiology at this point. His blood pressure looks okay and don't think he is having hypotensive episodes. Consider medication side effect that he's been on his current regimen for quite some time. I did encourage him to consider regular exercise. I think this would really help with his energy and endurance levels. He does not snore so sleep  apnea is unlikely. We will do some additional labs including thyroid etc. today.  Flu vaccine given.

## 2016-08-29 LAB — C-REACTIVE PROTEIN: CRP: 7 mg/L (ref <8.0)

## 2016-08-29 LAB — SEDIMENTATION RATE: SED RATE: 4 mm/h (ref 0–20)

## 2016-08-29 MED ORDER — POTASSIUM CHLORIDE CRYS ER 10 MEQ PO TBCR
10.0000 meq | EXTENDED_RELEASE_TABLET | Freq: Every day | ORAL | 1 refills | Status: DC
Start: 2016-08-29 — End: 2017-05-16

## 2016-08-29 MED ORDER — CARVEDILOL 3.125 MG PO TABS: 3.1250 mg | ORAL_TABLET | Freq: Two times a day (BID) | ORAL | 2 refills | Status: DC

## 2016-08-29 MED ORDER — FUROSEMIDE 40 MG PO TABS: 40.0000 mg | ORAL_TABLET | Freq: Every day | ORAL | 1 refills | Status: DC | PRN

## 2016-08-29 NOTE — Progress Notes (Signed)
All labs are normal. 

## 2016-09-05 ENCOUNTER — Other Ambulatory Visit: Payer: Self-pay | Admitting: *Deleted

## 2016-09-05 MED ORDER — LISINOPRIL 10 MG PO TABS: 10.0000 mg | ORAL_TABLET | Freq: Every day | ORAL | 1 refills | Status: DC

## 2016-09-05 NOTE — Progress Notes (Unsigned)
Pt stated that he is going to stop taking his eliquis he cannot afford this medication. And he does not want to go back on the coumadin. I informed him that I would send him some information about the patient assistant program that he may be able to qualify for. He would like for me to mail this to him.Maryruth Eve, [place]

## 2016-10-01 ENCOUNTER — Telehealth: Payer: Self-pay | Admitting: Family Medicine

## 2016-10-01 MED ORDER — TRAMADOL HCL 50 MG PO TABS: 50.0000 mg | ORAL_TABLET | Freq: Three times a day (TID) | ORAL | 3 refills | Status: DC | PRN

## 2016-10-01 NOTE — Telephone Encounter (Signed)
Pt called. He wants a refilll on his Tramadol. He wants to talk to you about rhis Eloquis because [facility] is wanting him to pay $400 plus copay. Thank.

## 2016-10-11 ENCOUNTER — Telehealth: Payer: Self-pay | Admitting: Family Medicine

## 2016-10-11 NOTE — Telephone Encounter (Signed)
We will need to call his pharmacy to verify.  I can't do anything about this.  If theywon't pay for eloquis he will have to do coumadin

## 2016-10-11 NOTE — Telephone Encounter (Signed)
Pt called. [employer] is telling him that he has no refills on his Tramadol but our records show he has 3 refills per [place]. He also has some concerns about his his Eloquis that  it is costing  Him $400 plus a copy.

## 2016-10-11 NOTE — Telephone Encounter (Signed)
[place] reported that Mr. Ramberg was in the donut hole. Left vm for pt to return call to clinic.

## 2016-10-11 NOTE — Telephone Encounter (Signed)
Patient called back request for u to call him back in the morning. Thanks

## 2016-10-12 ENCOUNTER — Encounter: Payer: Self-pay | Admitting: Family Medicine

## 2016-10-12 NOTE — Telephone Encounter (Signed)
Pt notified of being in the donut hole.  He stated that he have enough eliquis to last him til Dec. Pt advised to call office a week before he runs out of eliquis.

## 2016-12-04 ENCOUNTER — Telehealth: Payer: Self-pay | Admitting: Family Medicine

## 2016-12-04 NOTE — Telephone Encounter (Signed)
Pt called. He wants refill on Lisinopril 10 mg & Doxazosine 4mg [place]. He is currenlty not taking the Eloquis because its too high &.he wants to talk to you about changing to something less expensive.

## 2016-12-05 MED ORDER — LISINOPRIL 10 MG PO TABS: 10.0000 mg | ORAL_TABLET | Freq: Every day | ORAL | 1 refills | Status: DC

## 2016-12-05 MED ORDER — DOXAZOSIN MESYLATE 4 MG PO TABS: ORAL_TABLET | ORAL | 1 refills | Status: DC

## 2016-12-05 NOTE — Telephone Encounter (Signed)
Called pt and asked if he sent in the information for the patient assistance program for the Eliquis? He stated that he sent in the information and he did not hear anything back from them. He wanted to know if he could just take Asprin to thin his blood. I told him that I would speak to Dr. Madilyn Fireman about this and get back to him to let him know what he can do regarding this. Pt voiced understanding and agreed.Jerome Hicks

## 2016-12-11 NOTE — Telephone Encounter (Signed)
Called pt and informed him that unfortunately he will have to go back on the coumadin therapy. He would like rx for this sent to [place].Elouise Munroe'   Fwd to pcp for f/u.Audelia Hives [place]

## 2016-12-12 MED ORDER — WARFARIN SODIUM 5 MG PO TABS: 5.0000 mg | ORAL_TABLET | Freq: Every day | ORAL | 3 refills | Status: DC

## 2016-12-12 NOTE — Telephone Encounter (Signed)
Call patient: Perception sent to the pharmacy for 5 mg. Once he's been on it for about 10 days he will need to come in for an INR check and we will go from there and adjust as needed. Beatrice Lecher, MD

## 2016-12-13 NOTE — Telephone Encounter (Signed)
Pt inforned appt made.Jerome Hicks

## 2016-12-21 ENCOUNTER — Ambulatory Visit (INDEPENDENT_AMBULATORY_CARE_PROVIDER_SITE_OTHER): Payer: Medicare HMO | Admitting: Family Medicine

## 2016-12-21 DIAGNOSIS — I482 Chronic atrial fibrillation, unspecified: Secondary | ICD-10-CM

## 2016-12-21 LAB — POCT INR: INR: 4

## 2016-12-21 NOTE — Progress Notes (Signed)
Pt advised of results and recommendation for follow up.

## 2016-12-24 ENCOUNTER — Encounter: Payer: Self-pay | Admitting: Sports Medicine

## 2016-12-24 ENCOUNTER — Ambulatory Visit (INDEPENDENT_AMBULATORY_CARE_PROVIDER_SITE_OTHER): Payer: Medicare HMO | Admitting: Sports Medicine

## 2016-12-24 DIAGNOSIS — M19012 Primary osteoarthritis, left shoulder: Secondary | ICD-10-CM | POA: Diagnosis not present

## 2016-12-24 DIAGNOSIS — M19011 Primary osteoarthritis, right shoulder: Secondary | ICD-10-CM | POA: Diagnosis not present

## 2016-12-24 DIAGNOSIS — M19019 Primary osteoarthritis, unspecified shoulder: Secondary | ICD-10-CM

## 2016-12-24 NOTE — Assessment & Plan Note (Signed)
Bilateral glenohumeral injections as above, previous injections were 8 months ago. Return as needed.

## 2016-12-24 NOTE — Progress Notes (Signed)
  Subjective:    CC: Bilateral shoulder pain  HPI: This is a pleasant 66 year old male, he has known bilateral glenohumeral osteoarthritis, did well about 8 months ago with an injection and is having a recurrence of pain, moderate, persistent, bilateral at the anterior joint line, worse than right. Desires repeat interventional treatment today, pain does not radiate.  Past medical history:  Negative.  See flowsheet/record as well for more information.  Surgical history: Negative.  See flowsheet/record as well for more information.  Family history: Negative.  See flowsheet/record as well for more information.  Social history: Negative.  See flowsheet/record as well for more information.  Allergies, and medications have been entered into the medical record, reviewed, and no changes needed.   Review of Systems: No fevers, chills, night sweats, weight loss, chest pain, or shortness of breath.   Objective:    General: Well Developed, well nourished, and in no acute distress.  Neuro: Alert and oriented x3, extra-ocular muscles intact, sensation grossly intact.  HEENT: Normocephalic, atraumatic, pupils equal round reactive to light, neck supple, no masses, no lymphadenopathy, thyroid nonpalpable.  Skin: Warm and dry, no rashes. Cardiac: Regular rate and rhythm, no murmurs rubs or gallops, no lower extremity edema.  Respiratory: Clear to auscultation bilaterally. Not using accessory muscles, speaking in full sentences.  Procedure: Real-time Ultrasound Guided Injection of left glenohumeral joint Device: GE Logiq E  Verbal informed consent obtained.  Time-out conducted.  Noted no overlying erythema, induration, or other signs of local infection.  Skin prepped in a sterile fashion.  Local anesthesia: Topical Ethyl chloride.  With sterile technique and under real time ultrasound guidance:  1 mL kenalog 40, 4 mL Marcaine injected easily Completed without difficulty  Pain immediately resolved  suggesting accurate placement of the medication.  Advised to call if fevers/chills, erythema, induration, drainage, or persistent bleeding.  Images permanently stored and available for review in the ultrasound unit.  Impression: Technically successful ultrasound guided injection.  Procedure: Real-time Ultrasound Guided Injection of right glenohumeral joint Device: GE Logiq E  Verbal informed consent obtained.  Time-out conducted.  Noted no overlying erythema, induration, or other signs of local infection.  Skin prepped in a sterile fashion.  Local anesthesia: Topical Ethyl chloride.  With sterile technique and under real time ultrasound guidance:  1 mL kenalog 40, 4 mL Marcaine injected easily Completed without difficulty  Pain immediately resolved suggesting accurate placement of the medication.  Advised to call if fevers/chills, erythema, induration, drainage, or persistent bleeding.  Images permanently stored and available for review in the ultrasound unit.  Impression: Technically successful ultrasound guided injection.  Impression and Recommendations:    Glenohumeral arthritis of both shoulders Bilateral glenohumeral injections as above, previous injections were 8 months ago. Return as needed.

## 2017-01-04 ENCOUNTER — Ambulatory Visit: Payer: Commercial Managed Care - HMO

## 2017-01-11 ENCOUNTER — Ambulatory Visit (INDEPENDENT_AMBULATORY_CARE_PROVIDER_SITE_OTHER): Payer: Medicare HMO | Admitting: Family Medicine

## 2017-01-11 DIAGNOSIS — I482 Chronic atrial fibrillation, unspecified: Secondary | ICD-10-CM

## 2017-01-11 LAB — POCT INR: INR: 2.5

## 2017-01-29 ENCOUNTER — Telehealth: Payer: Self-pay | Admitting: Family Medicine

## 2017-01-29 MED ORDER — TRAMADOL HCL 50 MG PO TABS: 50.0000 mg | ORAL_TABLET | Freq: Three times a day (TID) | ORAL | 3 refills | Status: DC | PRN

## 2017-01-29 NOTE — Telephone Encounter (Signed)
Pt called. Needs refill on Tramadol.  Thanks!

## 2017-01-29 NOTE — Telephone Encounter (Signed)
rx sent.Jerome Hicks  

## 2017-02-04 ENCOUNTER — Telehealth: Payer: Self-pay | Admitting: Emergency Medicine

## 2017-02-04 NOTE — Telephone Encounter (Signed)
Patient called to say [employer] has sent him letter that they cannot cover any more of his tramadol rx until pre-authorized by Dr.Metheney. He would like this to happen. Spoke with Seth Bake who said to send this on to provider.

## 2017-02-04 NOTE — Telephone Encounter (Signed)
OK for PA

## 2017-02-08 ENCOUNTER — Ambulatory Visit (INDEPENDENT_AMBULATORY_CARE_PROVIDER_SITE_OTHER): Payer: Medicare HMO | Admitting: Family Medicine

## 2017-02-08 ENCOUNTER — Telehealth: Payer: Self-pay | Admitting: *Deleted

## 2017-02-08 DIAGNOSIS — I4891 Unspecified atrial fibrillation: Secondary | ICD-10-CM | POA: Diagnosis not present

## 2017-02-08 LAB — POCT INR: INR: 2.8

## 2017-02-08 MED ORDER — TRAMADOL HCL 50 MG PO TABS: 50.0000 mg | ORAL_TABLET | Freq: Three times a day (TID) | ORAL | 3 refills | Status: DC | PRN

## 2017-02-08 NOTE — Telephone Encounter (Signed)
Pre Authorization sent to cover my meds. [id number]

## 2017-02-08 NOTE — Telephone Encounter (Signed)
Pre Authorization sent to cover my meds was submitted and denied for tramadol. They are denying this med because the insurance benefit provides for a 30 day supply. Called the patient and to let him know this. Asked if he wanted a prescription to go to [facility] for 30 day supply. He asked that a prescription be sent just for 30 day supply instead to [facility]. I did say to him that  [facility] is a mail order pharmacy it may be  more expensive or they may not even fill if for 30 day supply. He still wanted me to send it to [facility] for a 30 day supply.

## 2017-02-18 NOTE — Progress Notes (Signed)
Pt informed. Pt expressed understanding and is agreeable. Cythina Mickelsen CMA, RT 

## 2017-02-25 ENCOUNTER — Other Ambulatory Visit: Payer: Self-pay | Admitting: Family Medicine

## 2017-02-25 ENCOUNTER — Ambulatory Visit (INDEPENDENT_AMBULATORY_CARE_PROVIDER_SITE_OTHER): Payer: Medicare HMO | Admitting: Family Medicine

## 2017-02-25 VITALS — BP 124/80 | HR 82 | Wt 294.0 lb

## 2017-02-25 DIAGNOSIS — I10 Essential (primary) hypertension: Secondary | ICD-10-CM | POA: Diagnosis not present

## 2017-02-25 DIAGNOSIS — N183 Chronic kidney disease, stage 3 unspecified: Secondary | ICD-10-CM

## 2017-02-25 DIAGNOSIS — I482 Chronic atrial fibrillation, unspecified: Secondary | ICD-10-CM

## 2017-02-25 LAB — BASIC METABOLIC PANEL WITH GFR
BUN: 18 mg/dL (ref 7–25)
CHLORIDE: 107 mmol/L (ref 98–110)
CO2: 26 mmol/L (ref 20–31)
Calcium: 8.6 mg/dL (ref 8.6–10.3)
Creat: 1.02 mg/dL (ref 0.70–1.25)
GFR, Est African American: 88 mL/min (ref >=60)
GFR, Est Non African American: 76 mL/min (ref >=60)
Glucose, Bld: 116 mg/dL — ABNORMAL HIGH (ref 65–99)
Potassium: 4 mmol/L (ref 3.5–5.3)
SODIUM: 141 mmol/L (ref 135–146)

## 2017-02-25 NOTE — Progress Notes (Signed)
Subjective:    CC: HTN   HPI: Hypertension- Pt denies chest pain, SOB, dizziness, or heart palpitations.  Taking meds as directed w/o problems.  Denies medication side effects.  Report home BPs running aroudn 125/70s.    CKD 3 - no change in urination.   Afib - Doing well on his coumadin.  Not due for next INR for about 2 more weeks. He's not had any bleeding problems. We'll switch back to Coumadin because of the financial expense of some of the newer agents.  Past medical history, Surgical history, Family history not pertinant except as noted below, Social history, Allergies, and medications have been entered into the medical record, reviewed, and corrections made.   Review of Systems: No fevers, chills, night sweats, weight loss, chest pain, or shortness of breath.   Objective:    General: Well Developed, well nourished, and in no acute distress.  Neuro: Alert and oriented x3, extra-ocular muscles intact, sensation grossly intact.  HEENT: Normocephalic, atraumatic  Skin: Warm and dry, no rashes. Cardiac: Regular rate and rhythm, no murmurs rubs or gallops, no lower extremity edema.  Respiratory: Clear to auscultation bilaterally. Not using accessory muscles, speaking in full sentences.   Impression and Recommendations:    HTN - Well controlled. Continue current regimen. Follow up in  6 mo.   CKD 3- due to recheck renal function.   Atrial fibrillation-doing well. Stable. Follow-up in 2 weeks for repeat INR.

## 2017-02-26 ENCOUNTER — Telehealth: Payer: Self-pay

## 2017-02-26 NOTE — Telephone Encounter (Signed)
Left message with lab results and to call if any questions or concerns.

## 2017-02-26 NOTE — Progress Notes (Signed)
All labs are normal. 

## 2017-03-08 ENCOUNTER — Ambulatory Visit (INDEPENDENT_AMBULATORY_CARE_PROVIDER_SITE_OTHER): Payer: Medicare HMO | Admitting: Family Medicine

## 2017-03-08 ENCOUNTER — Ambulatory Visit: Payer: Medicare HMO

## 2017-03-08 DIAGNOSIS — I482 Chronic atrial fibrillation, unspecified: Secondary | ICD-10-CM

## 2017-03-08 LAB — POCT INR: INR: 3

## 2017-03-08 NOTE — Progress Notes (Signed)
Pt advised. Verbalized understanding. He will contact clinic for follow up appt.

## 2017-03-22 ENCOUNTER — Telehealth: Payer: Self-pay | Admitting: *Deleted

## 2017-03-22 ENCOUNTER — Ambulatory Visit (INDEPENDENT_AMBULATORY_CARE_PROVIDER_SITE_OTHER): Payer: Medicare HMO | Admitting: Family Medicine

## 2017-03-22 DIAGNOSIS — I4891 Unspecified atrial fibrillation: Secondary | ICD-10-CM | POA: Diagnosis not present

## 2017-03-22 LAB — POCT INR: INR: 3.1

## 2017-03-22 NOTE — Progress Notes (Signed)
Patient is here for INR check. Patient reports taking 5 mg sat/mon/wed/ 2.5 all other days.

## 2017-03-22 NOTE — Telephone Encounter (Signed)
Patient notified of INR results and new coumadin instructions. Patient voiced understanding. Transferred to schedule a nurse visit for 3 weeks.

## 2017-04-05 ENCOUNTER — Ambulatory Visit: Payer: Medicare HMO

## 2017-04-09 DIAGNOSIS — H5213 Myopia, bilateral: Secondary | ICD-10-CM | POA: Diagnosis not present

## 2017-04-09 DIAGNOSIS — H25813 Combined forms of age-related cataract, bilateral: Secondary | ICD-10-CM | POA: Diagnosis not present

## 2017-04-09 DIAGNOSIS — H02403 Unspecified ptosis of bilateral eyelids: Secondary | ICD-10-CM | POA: Diagnosis not present

## 2017-04-09 DIAGNOSIS — H52223 Regular astigmatism, bilateral: Secondary | ICD-10-CM | POA: Diagnosis not present

## 2017-04-09 DIAGNOSIS — H04123 Dry eye syndrome of bilateral lacrimal glands: Secondary | ICD-10-CM | POA: Diagnosis not present

## 2017-04-09 DIAGNOSIS — H524 Presbyopia: Secondary | ICD-10-CM | POA: Diagnosis not present

## 2017-04-12 ENCOUNTER — Ambulatory Visit (INDEPENDENT_AMBULATORY_CARE_PROVIDER_SITE_OTHER): Payer: Medicare HMO | Admitting: Family Medicine

## 2017-04-12 ENCOUNTER — Ambulatory Visit (INDEPENDENT_AMBULATORY_CARE_PROVIDER_SITE_OTHER): Payer: Medicare HMO | Admitting: Sports Medicine

## 2017-04-12 ENCOUNTER — Encounter: Payer: Self-pay | Admitting: Sports Medicine

## 2017-04-12 DIAGNOSIS — I4891 Unspecified atrial fibrillation: Secondary | ICD-10-CM

## 2017-04-12 DIAGNOSIS — M19019 Primary osteoarthritis, unspecified shoulder: Secondary | ICD-10-CM

## 2017-04-12 LAB — POCT INR: INR: 2.3

## 2017-04-12 NOTE — Assessment & Plan Note (Addendum)
Previous injection was 4 months ago, repeat bilateral glenohumeral injection, we discussed the strengths and limitations of experimental treatments such as BMAC

## 2017-04-12 NOTE — Progress Notes (Signed)
  Subjective:    CC: Bilateral shoulder pain  HPI:  Bilateral glenohumeral arthritis: Did well with injection for 5 months ago, now having recurrence of pain, moderate, persistent, localized to the joint lines without radiation, desires repeat interventional treatment today.  Past medical history:  Negative.  See flowsheet/record as well for more information.  Surgical history: Negative.  See flowsheet/record as well for more information.  Family history: Negative.  See flowsheet/record as well for more information.  Social history: Negative.  See flowsheet/record as well for more information.  Allergies, and medications have been entered into the medical record, reviewed, and no changes needed.   Review of Systems: No fevers, chills, night sweats, weight loss, chest pain, or shortness of breath.   Objective:    General: Well Developed, well nourished, and in no acute distress.  Neuro: Alert and oriented x3, extra-ocular muscles intact, sensation grossly intact.  HEENT: Normocephalic, atraumatic, pupils equal round reactive to light, neck supple, no masses, no lymphadenopathy, thyroid nonpalpable.  Skin: Warm and dry, no rashes. Cardiac: Regular rate and rhythm, no murmurs rubs or gallops, no lower extremity edema.  Respiratory: Clear to auscultation bilaterally. Not using accessory muscles, speaking in full sentences.  Procedure: Real-time Ultrasound Guided Injection of left glenohumeral joint Device: GE Logiq E  Verbal informed consent obtained.  Time-out conducted.  Noted no overlying erythema, induration, or other signs of local infection.  Skin prepped in a sterile fashion.  Local anesthesia: Topical Ethyl chloride.  With sterile technique and under real time ultrasound guidance:  1 mL kenalog 40, 4 mL Marcaine injected easily Completed without difficulty  Pain immediately resolved suggesting accurate placement of the medication.  Advised to call if fevers/chills, erythema,  induration, drainage, or persistent bleeding.  Images permanently stored and available for review in the ultrasound unit.  Impression: Technically successful ultrasound guided injection.  Procedure: Real-time Ultrasound Guided Injection of right glenohumeral joint Device: GE Logiq E  Verbal informed consent obtained.  Time-out conducted.  Noted no overlying erythema, induration, or other signs of local infection.  Skin prepped in a sterile fashion.  Local anesthesia: Topical Ethyl chloride.  With sterile technique and under real time ultrasound guidance:  1 mL kenalog 40, 4 mL Marcaine injected easily Completed without difficulty  Pain immediately resolved suggesting accurate placement of the medication.  Advised to call if fevers/chills, erythema, induration, drainage, or persistent bleeding.  Images permanently stored and available for review in the ultrasound unit.  Impression: Technically successful ultrasound guided injection.  Impression and Recommendations:    Glenohumeral arthritis of both shoulders Previous injection was 4 months ago, repeat bilateral glenohumeral injection, we discussed the strengths and limitations of experimental treatments such as BMAC

## 2017-04-25 ENCOUNTER — Encounter: Payer: Self-pay | Admitting: Family Medicine

## 2017-04-25 ENCOUNTER — Ambulatory Visit (INDEPENDENT_AMBULATORY_CARE_PROVIDER_SITE_OTHER): Payer: Medicare HMO | Admitting: Family Medicine

## 2017-04-25 VITALS — BP 115/74 | HR 83 | Ht 75.0 in | Wt 290.0 lb

## 2017-04-25 DIAGNOSIS — I4891 Unspecified atrial fibrillation: Secondary | ICD-10-CM

## 2017-04-25 DIAGNOSIS — R5383 Other fatigue: Secondary | ICD-10-CM

## 2017-04-25 DIAGNOSIS — I10 Essential (primary) hypertension: Secondary | ICD-10-CM

## 2017-04-25 DIAGNOSIS — Z125 Encounter for screening for malignant neoplasm of prostate: Secondary | ICD-10-CM | POA: Diagnosis not present

## 2017-04-25 DIAGNOSIS — Z0000 Encounter for general adult medical examination without abnormal findings: Secondary | ICD-10-CM

## 2017-04-25 LAB — POCT INR: INR: 2

## 2017-04-25 NOTE — Progress Notes (Signed)
Pt here for INR check no missed doses, diet changes,bruising,bleeding,CP,SOB- on exertion.  He takes whole tab 5 mg on Saturday and Wednesday. 1/2 tab 2.5 mg all other days.Jerome Hicks [place]

## 2017-04-25 NOTE — Progress Notes (Signed)
Subjective:   Jerome Hicks is a 66 y.o. male who presents for Medicare Annual/Subsequent preventive examination.  Neck she has an appointment next Thursday to discuss cataract surgery. He doesn't know when it'll be scheduled yet. They're also going to do a limited lift. He is also [name] schedule an appointment for formal hearing testing for hearing aids. He knows he needs them.  He does try to walk 1 mile a day.  His biggest concern is fatigue.He says he just gets tired easily especially with activities. Sometimes it is shortness of breath. No muscle aches or pains. No chest pain or palpitations with it  Review of Systems:  Comprehensive ROS is negative.         Objective:    Vitals: BP 115/74   Pulse 83   Ht 6\' 3"  (1.905 m)   Wt 290 lb (131.5 kg)   SpO2 100%   BMI 36.25 kg/m   Body mass index is 36.25 kg/m.   Physical Exam     Tobacco History  Smoking Status  . Never Smoker  Smokeless Tobacco  . Never Used     Counseling given: Not Answered   Past Medical History:  Diagnosis Date  . 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 Atrial fibrillation ([place])   . ED (erectile dysfunction)    has been prescribed Levitra and Viagrain in the past  . Hypertension   . Internal hemorrhoids   . Kidney stones   . Leg edema    recurrent cellulitis  . Scrotal abscess 3-03  . Sigmoid diverticulosis    Past Surgical History:  Procedure Laterality Date  .  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 CHOLECYSTECTOMY  1971  . kidney stones  1990's   Ureteroscopic stone extraction  . TONSILLECTOMY     Family History  Problem Relation Age of Onset  . Heart attack Mother 52  . Heart attack Father 61  . Heart attack Sister 91  . Heart attack Brother 53  . Multiple sclerosis Daughter    History  Sexual Activity  . Sexual activity: Not on file    Outpatient Encounter Prescriptions as of 04/25/2017  Medication Sig  . carvedilol (COREG) 3.125  MG tablet Take 1 tablet (3.125 mg total) by mouth 2 (two) times daily with a meal.  . clotrimazole-betamethasone (LOTRISONE) cream Apply 1 application topically.  . doxazosin (CARDURA) 4 MG tablet TAKE 1 TABLET (4 MG TOTAL) BY MOUTH DAILY.  . furosemide (LASIX) 40 MG tablet Take 1-2 tablets (40-80 mg total) by mouth daily as needed for fluid or edema.  Marland Kitchen lisinopril (PRINIVIL,ZESTRIL) 10 MG tablet Take 1 tablet (10 mg total) by mouth daily.  . meclizine (ANTIVERT) 25 MG tablet TAKE ONE TABLET BY MOUTH THREE TIMES DAILY AS NEEDED FOR DIZZINESS OR NAUSEA AS DIRECTED  . potassium chloride (K-DUR,KLOR-CON) 10 MEQ tablet Take 1 tablet (10 mEq total) by mouth daily.  . traMADol (ULTRAM) 50 MG tablet Take 1 tablet (50 mg total) by mouth every 8 (eight) hours as needed.  . warfarin (COUMADIN) 5 MG tablet Take 1 tablet (5 mg total) by mouth daily.   No facility-administered encounter medications on file as of 04/25/2017.     Activities of Daily Living In your present state of health, do you have any difficulty performing the following activities: 04/25/2017  Vision? Y  Difficulty concentrating or making decisions? N  Walking or climbing stairs? Y  Dressing or bathing? N  Doing errands, shopping? N  Some recent data might be hidden    Patient Care Team: Hali Marry, MD as PCP - General   Assessment:    Medicare WEllness Exam  Exercise Activities and Dietary recommendations Current Exercise Habits: Home exercise routine, Type of exercise: walking (1 mile a day), Frequency (Times/Week): 7, Intensity: Mild  Goals    None     Fall Risk Fall Risk  04/25/2017 07/01/2015 05/26/2013  Falls in the past year? No No No   Depression Screen PHQ 2/9 Scores 04/25/2017 07/01/2015 05/26/2013  PHQ - 2 Score 0 0 0    Cognitive Function     6CIT Screen 04/25/2017  What Year? 0 points  What month? 0 points  What time? 0 points  Count back from 20 0 points  Months in reverse 0 points  Repeat  phrase 6 points  Total Score 6    Immunization History  Administered Date(s) Administered  . Influenza Split 08/21/2012  . Influenza Whole 08/27/2008, 11/07/2009, 10/18/2010, 08/20/2011  . Influenza,inj,Quad PF,36+ Mos 08/25/2013, 08/05/2014, 08/31/2015, 08/28/2016  . Pneumococcal Conjugate-13 09/29/2013  . Pneumococcal Polysaccharide-23 07/14/2015  . Td 12/03/1994, 06/15/2009  . Zoster 06/18/2011   Screening Tests Health Maintenance  Topic Date Due  . INFLUENZA VACCINE  07/03/2017  . COLONOSCOPY  05/26/2019  . TETANUS/TDAP  06/16/2019  . Hepatitis C Screening  Completed  . PNA vac Low Risk Adult  Completed      Plan:     I have personally reviewed and noted the following in the patient's chart:   . Medical and social history . Use of alcohol, tobacco or illicit drugs  . Current medications and supplements . Functional ability and status . Nutritional status . Physical activity updated.  . Advanced directives . List of other physicians . Hospitalizations, surgeries, and ER visits in previous 12 months . Vitals . Screenings to include cognitive, depression, and falls . Referrals and appointments . Fatigue/shortness of breath-he actually has a yearly breathing test scheduled in about a month. He has a history of asbestosis which certainly could be contributing. He does try to walk a mile a day but says sometimes he just can't complete it. We could also get an echocardiogram for further workup as well just to make sure that his heart looks like it's working well.  In addition, I have reviewed and discussed with patient certain preventive protocols, quality metrics, and best practice recommendations. A written personalized care plan for preventive services as well as general preventive health recommendations were provided to patient.     Ether Goebel, MD  04/25/2017

## 2017-04-25 NOTE — Patient Instructions (Signed)
Keep up a regular exercise program and make sure you are eating a healthy diet Try to eat 4 servings of dairy a day, or if you are lactose intolerant take a calcium with vitamin D daily.  Your vaccines are up to date.   Increase Coumadin to a whole tab 3 days a week. For example Monday, Wednesday and Saturdays. Half tabs all other days of the week. Recheck Coumadin level in 3-4 weeks.

## 2017-04-30 DIAGNOSIS — Z125 Encounter for screening for malignant neoplasm of prostate: Secondary | ICD-10-CM | POA: Diagnosis not present

## 2017-04-30 DIAGNOSIS — H02423 Myogenic ptosis of bilateral eyelids: Secondary | ICD-10-CM | POA: Diagnosis not present

## 2017-04-30 DIAGNOSIS — H25811 Combined forms of age-related cataract, right eye: Secondary | ICD-10-CM | POA: Diagnosis not present

## 2017-04-30 DIAGNOSIS — H25812 Combined forms of age-related cataract, left eye: Secondary | ICD-10-CM | POA: Diagnosis not present

## 2017-04-30 DIAGNOSIS — R5383 Other fatigue: Secondary | ICD-10-CM | POA: Diagnosis not present

## 2017-04-30 DIAGNOSIS — Z1321 Encounter for screening for nutritional disorder: Secondary | ICD-10-CM | POA: Diagnosis not present

## 2017-04-30 DIAGNOSIS — H25813 Combined forms of age-related cataract, bilateral: Secondary | ICD-10-CM | POA: Diagnosis not present

## 2017-04-30 DIAGNOSIS — I4891 Unspecified atrial fibrillation: Secondary | ICD-10-CM | POA: Diagnosis not present

## 2017-04-30 DIAGNOSIS — I10 Essential (primary) hypertension: Secondary | ICD-10-CM | POA: Diagnosis not present

## 2017-05-01 LAB — LIPID PANEL W/REFLEX DIRECT LDL
CHOLESTEROL: 118 mg/dL (ref <200)
HDL: 47 mg/dL (ref >40)
LDL-Cholesterol: 58 mg/dL
NON-HDL CHOLESTEROL (CALC): 71 mg/dL (ref <130)
Total CHOL/HDL Ratio: 2.5 Ratio (ref <5.0)
Triglycerides: 44 mg/dL (ref <150)

## 2017-05-01 LAB — COMPLETE METABOLIC PANEL WITH GFR
ALBUMIN: 3.5 g/dL — AB (ref 3.6–5.1)
ALT: 21 U/L (ref 9–46)
AST: 16 U/L (ref 10–35)
Alkaline Phosphatase: 68 U/L (ref 40–115)
BUN: 22 mg/dL (ref 7–25)
CALCIUM: 8.6 mg/dL (ref 8.6–10.3)
CHLORIDE: 106 mmol/L (ref 98–110)
CO2: 29 mmol/L (ref 20–31)
CREATININE: 1 mg/dL (ref 0.70–1.25)
GFR, Est African American: >89 mL/min (ref >=60)
GFR, Est Non African American: 78 mL/min (ref >=60)
Glucose, Bld: 108 mg/dL — ABNORMAL HIGH (ref 65–99)
Potassium: 4.3 mmol/L (ref 3.5–5.3)
Sodium: 141 mmol/L (ref 135–146)
TOTAL PROTEIN: 5.7 g/dL — AB (ref 6.1–8.1)
Total Bilirubin: 2.1 mg/dL — ABNORMAL HIGH (ref 0.2–1.2)

## 2017-05-01 LAB — CBC WITH DIFFERENTIAL/PLATELET
BASOS PCT: 1 %
Basophils Absolute: 73 cells/uL (ref 0–200)
Eosinophils Absolute: 73 cells/uL (ref 15–500)
Eosinophils Relative: 1 %
HCT: 39.6 % (ref 38.5–50.0)
Hemoglobin: 13 g/dL — ABNORMAL LOW (ref 13.2–17.1)
LYMPHS PCT: 29 %
Lymphs Abs: 2117 cells/uL (ref 850–3900)
MCH: 30.7 pg (ref 27.0–33.0)
MCHC: 32.8 g/dL (ref 32.0–36.0)
MCV: 93.4 fL (ref 80.0–100.0)
MONO ABS: 584 {cells}/uL (ref 200–950)
MONOS PCT: 8 %
MPV: 10.1 fL (ref 7.5–12.5)
Neutro Abs: 4453 cells/uL (ref 1500–7800)
Neutrophils Relative %: 61 %
PLATELETS: 170 10*3/uL (ref 140–400)
RBC: 4.24 MIL/uL (ref 4.20–5.80)
RDW: 13 % (ref 11.0–15.0)
WBC: 7.3 10*3/uL (ref 3.8–10.8)

## 2017-05-01 LAB — VITAMIN B12: Vitamin B-12: 439 pg/mL (ref 200–1100)

## 2017-05-01 LAB — MAGNESIUM: MAGNESIUM: 2 mg/dL (ref 1.5–2.5)

## 2017-05-01 LAB — FERRITIN: Ferritin: 414 ng/mL — ABNORMAL HIGH (ref 20–380)

## 2017-05-01 LAB — TSH: TSH: 3.18 mIU/L (ref 0.40–4.50)

## 2017-05-01 LAB — PSA: PSA: 1.9 ng/mL (ref <=4.0)

## 2017-05-01 NOTE — Progress Notes (Signed)
All labs are normal. 

## 2017-05-15 ENCOUNTER — Telehealth: Payer: Self-pay | Admitting: Family Medicine

## 2017-05-15 NOTE — Telephone Encounter (Signed)
Patient called need 3 prescription refills sent to [facility] Doxazosin 4mg  tab, Lisionopril 10mg  Tabs and Potassium10 mg. Patient states he has 2wks of medicine left and to please send to [facility] Not local pharmacy. Thanks

## 2017-05-16 ENCOUNTER — Other Ambulatory Visit: Payer: Self-pay | Admitting: Family Medicine

## 2017-05-16 MED ORDER — FUROSEMIDE 40 MG PO TABS: 40.0000 mg | ORAL_TABLET | Freq: Every day | ORAL | 1 refills | Status: DC | PRN

## 2017-05-16 MED ORDER — POTASSIUM CHLORIDE CRYS ER 10 MEQ PO TBCR: 10.0000 meq | EXTENDED_RELEASE_TABLET | Freq: Every day | ORAL | 1 refills | Status: DC

## 2017-05-16 MED ORDER — CARVEDILOL 3.125 MG PO TABS: 3.1250 mg | ORAL_TABLET | Freq: Two times a day (BID) | ORAL | 2 refills | Status: DC

## 2017-05-16 NOTE — Telephone Encounter (Signed)
Pt did not need lisinopril. I did send prescriptions for Doxazosin and potassium and lasix.Audelia Hives [place]

## 2017-05-24 ENCOUNTER — Ambulatory Visit (INDEPENDENT_AMBULATORY_CARE_PROVIDER_SITE_OTHER): Payer: Medicare HMO | Admitting: Sports Medicine

## 2017-05-24 VITALS — BP 133/89 | HR 93 | Temp 98.3°F | Wt 297.0 lb

## 2017-05-24 DIAGNOSIS — R300 Dysuria: Secondary | ICD-10-CM | POA: Diagnosis not present

## 2017-05-24 DIAGNOSIS — I4891 Unspecified atrial fibrillation: Secondary | ICD-10-CM | POA: Diagnosis not present

## 2017-05-24 LAB — POCT URINALYSIS DIPSTICK
Blood, UA: NEGATIVE
Glucose, UA: NEGATIVE
Leukocytes, UA: NEGATIVE
Nitrite, UA: NEGATIVE
Protein, UA: NEGATIVE
Spec Grav, UA: 1.025 (ref 1.010–1.025)
Urobilinogen, UA: 1 E.U./dL
pH, UA: 5.5 (ref 5.0–8.0)

## 2017-05-24 LAB — POCT INR: INR: 3.2

## 2017-05-24 NOTE — Progress Notes (Signed)
Pt here for INR check no missed doses, diet changes,bruising,bleeding,CP,SOB. He is taking 5 mg 3 days a wk and 2.5 mg all other days.  Pt reports that he has been experiencing some dysuria x 1 wk with low back pain. Denies fever, sweats, chills, or mental status changes. He has not tried any OTC medications. He has been drinking more water. 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 Maryruth Eve, [place]

## 2017-05-24 NOTE — Assessment & Plan Note (Signed)
Seems he was taking a full tablet 3 days a week and a half tab 4 days a week.  INR now high.   Decrease dose . Take whole tab on Saturday, and Wednesday. Half tab all other days. Recheck in 2 weeks.

## 2017-05-24 NOTE — Progress Notes (Signed)
Called pt and informed him of recommendations to decrease dose. 5mg  on Saturday and Wednesday 2.5 mg all other days. Pt to RTC in 2 wks for recheck.  Pt informed that we will send urine for culture and advised to call or go to UC if any problems over the weekend. Pt voiced understanding and agreed to plan.Jerome Hicks [place]

## 2017-05-24 NOTE — Addendum Note (Signed)
Addended by: [name] on: 05/24/2017 12:17 PM   Modules accepted: Orders

## 2017-05-25 LAB — URINE CULTURE: Organism ID, Bacteria: NO GROWTH

## 2017-06-07 ENCOUNTER — Ambulatory Visit (INDEPENDENT_AMBULATORY_CARE_PROVIDER_SITE_OTHER): Payer: Medicare HMO | Admitting: Family Medicine

## 2017-06-07 DIAGNOSIS — I4891 Unspecified atrial fibrillation: Secondary | ICD-10-CM

## 2017-06-07 LAB — POCT INR: INR: 3.5

## 2017-06-07 NOTE — Progress Notes (Signed)
Pt notified of dose change & transferred to scheduling to 2 week recheck.

## 2017-06-21 ENCOUNTER — Ambulatory Visit: Payer: Medicare HMO

## 2017-06-24 ENCOUNTER — Ambulatory Visit (INDEPENDENT_AMBULATORY_CARE_PROVIDER_SITE_OTHER): Payer: Medicare HMO | Admitting: Family Medicine

## 2017-06-24 DIAGNOSIS — I4891 Unspecified atrial fibrillation: Secondary | ICD-10-CM | POA: Diagnosis not present

## 2017-06-24 LAB — POCT INR: INR: 1.8

## 2017-06-25 NOTE — Progress Notes (Signed)
Left VM with instructions and for pt to call back to verify.

## 2017-07-08 ENCOUNTER — Ambulatory Visit (INDEPENDENT_AMBULATORY_CARE_PROVIDER_SITE_OTHER): Payer: Medicare HMO | Admitting: Family Medicine

## 2017-07-08 DIAGNOSIS — I4891 Unspecified atrial fibrillation: Secondary | ICD-10-CM

## 2017-07-08 LAB — POCT INR: INR: 2.6

## 2017-07-09 NOTE — Progress Notes (Signed)
Pt notified of recommendations

## 2017-07-29 ENCOUNTER — Ambulatory Visit (INDEPENDENT_AMBULATORY_CARE_PROVIDER_SITE_OTHER): Payer: Medicare HMO | Admitting: Family Medicine

## 2017-07-29 DIAGNOSIS — I4891 Unspecified atrial fibrillation: Secondary | ICD-10-CM

## 2017-07-29 LAB — POCT INR: INR: 2.2

## 2017-07-29 NOTE — Progress Notes (Signed)
Pt advised, verbalized understanding. Follow up appt made.

## 2017-08-14 ENCOUNTER — Other Ambulatory Visit: Payer: Self-pay | Admitting: Family Medicine

## 2017-08-26 ENCOUNTER — Ambulatory Visit (INDEPENDENT_AMBULATORY_CARE_PROVIDER_SITE_OTHER): Payer: Medicare HMO | Admitting: Family Medicine

## 2017-08-26 VITALS — BP 122/80 | HR 73 | Wt 305.0 lb

## 2017-08-26 DIAGNOSIS — Z23 Encounter for immunization: Secondary | ICD-10-CM

## 2017-08-26 DIAGNOSIS — I482 Chronic atrial fibrillation, unspecified: Secondary | ICD-10-CM

## 2017-08-26 LAB — POCT INR: INR: 2.2

## 2017-08-26 NOTE — Progress Notes (Signed)
Patient advised of recommendations.  

## 2017-08-28 ENCOUNTER — Ambulatory Visit (INDEPENDENT_AMBULATORY_CARE_PROVIDER_SITE_OTHER): Payer: Medicare HMO | Admitting: Sports Medicine

## 2017-08-28 ENCOUNTER — Encounter: Payer: Self-pay | Admitting: Sports Medicine

## 2017-08-28 DIAGNOSIS — M19019 Primary osteoarthritis, unspecified shoulder: Secondary | ICD-10-CM

## 2017-08-28 NOTE — Assessment & Plan Note (Signed)
Bilateral injection as above, return as needed.

## 2017-08-28 NOTE — Progress Notes (Signed)
  Subjective:    CC: shoulder pain  HPI: Jerome Hicks returns, he's a pleasant 66 year old male, he has bilateral glenohumeral osteoarthritis, his previous injections were 4 months ago. Starting to have a recurrence of pain, moderate, persistent, localized at the joint lines without radiation.  Past medical history:  Negative.  See flowsheet/record as well for more information.  Surgical history: Negative.  See flowsheet/record as well for more information.  Family history: Negative.  See flowsheet/record as well for more information.  Social history: Negative.  See flowsheet/record as well for more information.  Allergies, and medications have been entered into the medical record, reviewed, and no changes needed.   Review of Systems: No fevers, chills, night sweats, weight loss, chest pain, or shortness of breath.   Objective:    General: Well Developed, well nourished, and in no acute distress.  Neuro: Alert and oriented x3, extra-ocular muscles intact, sensation grossly intact.  HEENT: Normocephalic, atraumatic, pupils equal round reactive to light, neck supple, no masses, no lymphadenopathy, thyroid nonpalpable.  Skin: Warm and dry, no rashes. Cardiac: Regular rate and rhythm, no murmurs rubs or gallops, no lower extremity edema.  Respiratory: Clear to auscultation bilaterally. Not using accessory muscles, speaking in full sentences.  Procedure: Real-time Ultrasound Guided Injection of left glenohumeral joint Device: GE Logiq E  Verbal informed consent obtained.  Time-out conducted.  Noted no overlying erythema, induration, or other signs of local infection.  Skin prepped in a sterile fashion.  Local anesthesia: Topical Ethyl chloride.  With sterile technique and under real time ultrasound guidance:  Using a 22-gauge spinal needle advanced into the joint and injected 1 mL Kenalog 40, 2 mL lidocaine, 2 mL bupivacaine. Completed without difficulty  Pain immediately resolved suggesting  accurate placement of the medication.  Advised to call if fevers/chills, erythema, induration, drainage, or persistent bleeding.  Images permanently stored and available for review in the ultrasound unit.  Impression: Technically successful ultrasound guided injection.  Procedure: Real-time Ultrasound Guided Injection of right glenohumeral joint Device: GE Logiq E  Verbal informed consent obtained.  Time-out conducted.  Noted no overlying erythema, induration, or other signs of local infection.  Skin prepped in a sterile fashion.  Local anesthesia: Topical Ethyl chloride.  With sterile technique and under real time ultrasound guidance:  Using a 22-gauge spinal needle advanced into the joint and injected 1 mL Kenalog 40, 2 mL lidocaine, 2 mL bupivacaine. Completed without difficulty  Pain immediately resolved suggesting accurate placement of the medication.  Advised to call if fevers/chills, erythema, induration, drainage, or persistent bleeding.  Images permanently stored and available for review in the ultrasound unit.  Impression: Technically successful ultrasound guided injection.  Impression and Recommendations:    Glenohumeral arthritis of both shoulders Bilateral injection as above, return as needed.  ___________________________________________ Gwen Her. Dianah Field, M.D., ABFM., CAQSM. Primary Care and [place] Instructor of [place] [facility]

## 2017-09-23 ENCOUNTER — Ambulatory Visit (INDEPENDENT_AMBULATORY_CARE_PROVIDER_SITE_OTHER): Payer: Medicare HMO | Admitting: Family Medicine

## 2017-09-23 DIAGNOSIS — I482 Chronic atrial fibrillation, unspecified: Secondary | ICD-10-CM

## 2017-09-23 LAB — POCT INR: INR: 2.1

## 2017-10-10 ENCOUNTER — Ambulatory Visit (INDEPENDENT_AMBULATORY_CARE_PROVIDER_SITE_OTHER): Payer: Medicare HMO | Admitting: Family Medicine

## 2017-10-10 ENCOUNTER — Encounter: Payer: Self-pay | Admitting: Family Medicine

## 2017-10-10 ENCOUNTER — Ambulatory Visit (INDEPENDENT_AMBULATORY_CARE_PROVIDER_SITE_OTHER): Payer: Medicare HMO

## 2017-10-10 VITALS — BP 115/71 | HR 69 | Wt 299.0 lb

## 2017-10-10 DIAGNOSIS — Z6837 Body mass index (BMI) 37.0-37.9, adult: Secondary | ICD-10-CM | POA: Diagnosis not present

## 2017-10-10 DIAGNOSIS — I878 Other specified disorders of veins: Secondary | ICD-10-CM | POA: Diagnosis not present

## 2017-10-10 DIAGNOSIS — N183 Chronic kidney disease, stage 3 unspecified: Secondary | ICD-10-CM

## 2017-10-10 DIAGNOSIS — G8929 Other chronic pain: Secondary | ICD-10-CM

## 2017-10-10 DIAGNOSIS — I10 Essential (primary) hypertension: Secondary | ICD-10-CM

## 2017-10-10 DIAGNOSIS — I482 Chronic atrial fibrillation, unspecified: Secondary | ICD-10-CM

## 2017-10-10 DIAGNOSIS — M25561 Pain in right knee: Secondary | ICD-10-CM

## 2017-10-10 LAB — COMPLETE METABOLIC PANEL WITH GFR
AG RATIO: 1.8 (calc) (ref 1.0–2.5)
ALBUMIN MSPROF: 3.8 g/dL (ref 3.6–5.1)
ALT: 16 U/L (ref 9–46)
AST: 20 U/L (ref 10–35)
Alkaline phosphatase (APISO): 56 U/L (ref 40–115)
BILIRUBIN TOTAL: 2.7 mg/dL — AB (ref 0.2–1.2)
BUN: 16 mg/dL (ref 7–25)
CHLORIDE: 105 mmol/L (ref 98–110)
CO2: 27 mmol/L (ref 20–32)
Calcium: 8.5 mg/dL — ABNORMAL LOW (ref 8.6–10.3)
Creat: 1 mg/dL (ref 0.70–1.25)
GFR, EST AFRICAN AMERICAN: 90 mL/min/{1.73_m2} (ref >=60)
GFR, Est Non African American: 78 mL/min/{1.73_m2} (ref >=60)
GLUCOSE: 111 mg/dL — AB (ref 65–99)
Globulin: 2.1 g/dL (calc) (ref 1.9–3.7)
POTASSIUM: 4.1 mmol/L (ref 3.5–5.3)
SODIUM: 138 mmol/L (ref 135–146)
TOTAL PROTEIN: 5.9 g/dL — AB (ref 6.1–8.1)

## 2017-10-10 LAB — POCT INR: INR: 2.3

## 2017-10-10 MED ORDER — AMBULATORY NON FORMULARY MEDICATION: 0 refills | Status: DC

## 2017-10-10 NOTE — Progress Notes (Signed)
Subjective:    Patient ID: Jerome Hicks, male    DOB: 07-03-1951, 66 y.o.   MRN: [id number]  HPI 66 yo male comes in today for Right knee pain x 6 months.  He states that got worse more recently.  It has been swelling on and off and says on Friday night the time he got home it was very swollen.  His girlfriend who is here with him today also reports that he has been eating a lot of salt lately.  He did injure that knee in high school while playing football.  It did not result in any type of surgical procedure.  He does not remember any recent injury over the last 6 months.  F/U Afibrillation -doing well.  No chest pain or shortness of breath.  Taking medication regularly.  Hypertension- Pt denies chest pain, SOB, dizziness, or heart palpitations.  Taking meds as directed w/o problems.  Denies medication side effects.       Review of Systems  BP 115/71   Pulse 69   Wt 299 lb (135.6 kg)   BMI 37.37 kg/m     Allergies  Allergen Reactions  . Penicillins     Past Medical History:  Diagnosis Date  . 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 Atrial fibrillation ([place])   . ED (erectile dysfunction)    has been prescribed Levitra and Viagrain in the past  . Hypertension   . Internal hemorrhoids   . Kidney stones   . Leg edema    recurrent cellulitis  . Scrotal abscess 3-03  . Sigmoid diverticulosis     Past Surgical History:  Procedure Laterality Date  .  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 CHOLECYSTECTOMY  1971  . kidney stones  1990's   Ureteroscopic stone extraction  . TONSILLECTOMY      Social History   Socioeconomic History  . Marital status: Widowed    Spouse name: Not on file  . Number of children: 2  . Years of education: Not on file  . Highest education level: Not on file  Social Needs  . Financial resource strain: Not on file  . Food insecurity - worry: Not on file  . Food insecurity - inability: Not on file  .  Transportation needs - medical: Not on file  . Transportation needs - non-medical: Not on file  Occupational History  . Occupation: On disability   Tobacco Use  . Smoking status: Never Smoker  . Smokeless tobacco: Never Used  Substance and Sexual Activity  . Alcohol use: No  . Drug use: No  . Sexual activity: Not on file  Other Topics Concern  . Not on file  Social History Narrative   Staying active    Family History  Problem Relation Age of Onset  . Heart attack Mother 33  . Heart attack Father 78  . Heart attack Sister 2  . Heart attack Brother 56  . Multiple sclerosis Daughter     Outpatient Encounter Medications as of 10/10/2017  Medication Sig  . carvedilol (COREG) 3.125 MG tablet Take 1 tablet (3.125 mg total) by mouth 2 (two) times daily with a meal.  . clotrimazole-betamethasone (LOTRISONE) cream Apply 1 application topically.  . doxazosin (CARDURA) 4 MG tablet TAKE 1 TABLET EVERY DAY  . furosemide (LASIX) 40 MG tablet Take 1-2 tablets (40-80 mg total) by mouth daily as needed for fluid or edema.  Marland Kitchen lisinopril (PRINIVIL,ZESTRIL) 10  MG tablet TAKE 1 TABLET EVERY DAY  . meclizine (ANTIVERT) 25 MG tablet TAKE 1 TABLET THREE TIMES DAILY AS NEEDED  FOR  DIZZINESS  OR  NAUSEA  AS DIRECTED  . potassium chloride (K-DUR,KLOR-CON) 10 MEQ tablet Take 1 tablet (10 mEq total) by mouth daily.  . traMADol (ULTRAM) 50 MG tablet TAKE 1 TABLET EVERY 8 HOURS AS NEEDED  . warfarin (COUMADIN) 5 MG tablet Take 1 tablet (5 mg total) by mouth daily.  . AMBULATORY NON FORMULARY MEDICATION Medication Name: Shingrix IM x 1. REpeat in 2-6 months.   No facility-administered encounter medications on file as of 10/10/2017.          Objective:   Physical Exam  Constitutional: He is oriented to person, place, and time. He appears well-developed and well-nourished.  HENT:  Head: Normocephalic and atraumatic.  Cardiovascular: Normal rate, regular rhythm and normal heart sounds.   Pulmonary/Chest: Effort normal and breath sounds normal.  Musculoskeletal:  Right knee with decreased flexion but normal extension.  Pain with varus stress.  Nontender over the joint lines or the patella.  He does have significant edema of that right knee compared to the left.  Unable to perform a McMurray's.  No increased laxity with anterior drawer.  Length is 5 out of 5 at the knee.  He has some trace pitting edema over the bilateral lower legs over the tibia.  Neurological: He is alert and oriented to person, place, and time.  Skin: Skin is warm and dry.  Psychiatric: He has a normal mood and affect. His behavior is normal.          Assessment & Plan:  Right knee pain -his swelling is still present today but it seems like it is much better than it was on Friday.  Discussed elevation, icing and using Tylenol as needed for pain.  He is on Coumadin so cannot take NSAIDs.  We will get a formal x-ray today since his pain is been present for 6 months.  He might benefit from injection and fluid removal.  We will schedule with Dr. Dianah Field if needed.  Afib - See coumadin flow sheet for adjustment. At goal.     Hypertension- Well controlled. Continue current regimen. Follow up in  6 months.     BMI 37 - discussed need for weight loss.  He has been trying to work on it some.  In fact he actually is down about 5 pounds today.  Continue to work on portion control and monitoring salt intake and staying active.  Extremity swelling-encouraged him to really watch his salt intake.  In the past he had problems with significant lower extremity edema and over the last couple years he is gotten this under good control with changes in his diet.

## 2017-10-10 NOTE — Patient Instructions (Signed)
Continue current couamdin dose and recheck in 4 weeks.

## 2017-10-14 ENCOUNTER — Ambulatory Visit: Payer: Medicare HMO

## 2017-10-15 ENCOUNTER — Telehealth: Payer: Self-pay | Admitting: Family Medicine

## 2017-10-15 NOTE — Telephone Encounter (Signed)
Results/recommendations given to patient. Pt will call and schedule an appt.Jerome Hicks [place]

## 2017-10-15 NOTE — Telephone Encounter (Signed)
Call patient: Please tell him I am sorry I am not sure why it is not in my in basket.  No fracture dislocation but he does have arthritis in all 3 compartments of the knee.  If it still swelling and like to get him in with 1 of our sports med docs for further evaluation.

## 2017-10-15 NOTE — Telephone Encounter (Signed)
Pt's xray results are not signed. Fwd to pcp .Audelia Hives [place]

## 2017-10-15 NOTE — Telephone Encounter (Signed)
Patient called and states that he got a xray last week and he needs someone to call with results please

## 2017-10-21 ENCOUNTER — Ambulatory Visit: Payer: Medicare HMO | Admitting: Sports Medicine

## 2017-10-21 DIAGNOSIS — M1711 Unilateral primary osteoarthritis, right knee: Secondary | ICD-10-CM | POA: Insufficient documentation

## 2017-10-21 DIAGNOSIS — Z96651 Presence of right artificial knee joint: Secondary | ICD-10-CM | POA: Insufficient documentation

## 2017-10-21 NOTE — Assessment & Plan Note (Addendum)
Aspiration and injection. Knee was strapped with a compressive dressing Return to see me in 1 month.

## 2017-10-21 NOTE — Progress Notes (Signed)
   Subjective:    I'm seeing this patient as a consultation for: Dr. Beatrice Lecher  CC: Right knee swelling  HPI: For the past several years this pleasant 66 year old male has had pain in his right knee, occasional episodes of swelling, pain at the medial joint line without trauma, no mechanical symptoms.  Moderate, persistent without radiation.  He is tried oral analgesics without any efficacy.  Past medical history, Surgical history, Family history not pertinant except as noted below, Social history, Allergies, and medications have been entered into the medical record, reviewed, and no changes needed.   Review of Systems: No headache, visual changes, nausea, vomiting, diarrhea, constipation, dizziness, abdominal pain, skin rash, fevers, chills, night sweats, weight loss, swollen lymph nodes, body aches, joint swelling, muscle aches, chest pain, shortness of breath, mood changes, visual or auditory hallucinations.   Objective:   General: Well Developed, well nourished, and in no acute distress.  Neuro:  Extra-ocular muscles intact, able to move all 4 extremities, sensation grossly intact.  Deep tendon reflexes tested were normal. Psych: Alert and oriented, mood congruent with affect. ENT:  Ears and nose appear unremarkable.  Hearing grossly normal. Neck: Unremarkable overall appearance, trachea midline.  No visible thyroid enlargement. Eyes: Conjunctivae and lids appear unremarkable.  Pupils equal and round. Skin: Warm and dry, no rashes noted.  Cardiovascular: Pulses palpable, no extremity edema. Right knee: Swollen, palpable fluid wave, tenderness at the medial joint line ROM normal in flexion and extension and lower leg rotation. Ligaments with solid consistent endpoints including ACL, PCL, LCL, MCL. Negative Mcmurray's and provocative meniscal tests. Non painful patellar compression. Patellar and quadriceps tendons unremarkable. Hamstring and quadriceps strength is  normal.  Procedure: Real-time Ultrasound Guided aspiration/injection of right knee Device: GE Logiq E  Verbal informed consent obtained.  Time-out conducted.  Noted no overlying erythema, induration, or other signs of local infection.  Skin prepped in a sterile fashion.  Local anesthesia: Topical Ethyl chloride.  With sterile technique and under real time ultrasound guidance: Using an 18-gauge needle advanced into the suprapatellar recess, I then aspirated 14 mL of straw-colored fluid, syringe switched and 1 cc Kenalog 40, 2 cc lidocaine, 2 cc bupivacaine injected. Completed without difficulty  Pain immediately resolved suggesting accurate placement of the medication.  Advised to call if fevers/chills, erythema, induration, drainage, or persistent bleeding.  Images permanently stored and available for review in the ultrasound unit.  Impression: Technically successful ultrasound guided injection.  Impression and Recommendations:   This case required medical decision making of moderate complexity.  Primary osteoarthritis of right knee Aspiration and injection. Return to see me in 1 month.  ___________________________________________ Gwen Her. Dianah Field, M.D., ABFM., CAQSM. Primary Care and [place] Instructor of [place] of [facility] of Medicine

## 2017-10-28 ENCOUNTER — Ambulatory Visit: Payer: Medicare HMO | Admitting: Family Medicine

## 2017-11-07 ENCOUNTER — Ambulatory Visit (INDEPENDENT_AMBULATORY_CARE_PROVIDER_SITE_OTHER): Payer: Medicare HMO | Admitting: Family Medicine

## 2017-11-07 DIAGNOSIS — I482 Chronic atrial fibrillation, unspecified: Secondary | ICD-10-CM

## 2017-11-07 LAB — POCT INR: INR: 3

## 2017-11-07 NOTE — Progress Notes (Signed)
Pt advised and scheduled.

## 2017-11-18 ENCOUNTER — Encounter: Payer: Self-pay | Admitting: Sports Medicine

## 2017-11-18 ENCOUNTER — Ambulatory Visit (INDEPENDENT_AMBULATORY_CARE_PROVIDER_SITE_OTHER): Payer: Medicare HMO | Admitting: Sports Medicine

## 2017-11-18 DIAGNOSIS — M1711 Unilateral primary osteoarthritis, right knee: Secondary | ICD-10-CM

## 2017-11-18 NOTE — Progress Notes (Signed)
  Subjective:    CC: Right knee pain  HPI: Jerome Hicks is a pleasant 66 year old male, I injected his right knee and drained it about a month ago, he returns today doing extremely well.  He does have some expected minor discomfort at the medial joint line with certain movements but overall is happy and does not desire any further intervention.  Past medical history:  Negative.  See flowsheet/record as well for more information.  Surgical history: Negative.  See flowsheet/record as well for more information.  Family history: Negative.  See flowsheet/record as well for more information.  Social history: Negative.  See flowsheet/record as well for more information.  Allergies, and medications have been entered into the medical record, reviewed, and no changes needed.   (To billers/coders, pertinent past medical, social, surgical, family history can be found in problem list, if problem list is marked as reviewed then this indicates that past medical, social, surgical, family history was also reviewed)  Review of Systems: No fevers, chills, night sweats, weight loss, chest pain, or shortness of breath.   Objective:    General: Well Developed, well nourished, and in no acute distress.  Neuro: Alert and oriented x3, extra-ocular muscles intact, sensation grossly intact.  HEENT: Normocephalic, atraumatic, pupils equal round reactive to light, neck supple, no masses, no lymphadenopathy, thyroid nonpalpable.  Skin: Warm and dry, no rashes. Cardiac: Regular rate and rhythm, no murmurs rubs or gallops, no lower extremity edema.  Respiratory: Clear to auscultation bilaterally. Not using accessory muscles, speaking in full sentences. Right knee: Normal to inspection with no erythema or effusion or obvious bony abnormalities. Palpation normal with no warmth or joint line tenderness or patellar tenderness or condyle tenderness. ROM normal in flexion and extension and lower leg rotation. Ligaments with solid  consistent endpoints including ACL, PCL, LCL, MCL. Negative Mcmurray's and provocative meniscal tests. Non painful patellar compression. Patellar and quadriceps tendons unremarkable. Hamstring and quadriceps strength is normal.  Impression and Recommendations:    Primary osteoarthritis of right knee Patient does not desire any treatment from here are not but he would be a candidate for Visco supplementation. Doing well, return as needed.  ___________________________________________ Gwen Her. Dianah Field, M.D., ABFM., CAQSM. Primary Care and [place] Instructor of [place] of [facility] of Medicine

## 2017-11-18 NOTE — Assessment & Plan Note (Addendum)
Patient does not desire any treatment from here are not but he would be a candidate for Visco supplementation. Doing well, return as needed.

## 2017-11-28 ENCOUNTER — Ambulatory Visit (INDEPENDENT_AMBULATORY_CARE_PROVIDER_SITE_OTHER): Payer: Medicare HMO | Admitting: Family Medicine

## 2017-11-28 DIAGNOSIS — I482 Chronic atrial fibrillation, unspecified: Secondary | ICD-10-CM

## 2017-11-28 LAB — POCT INR: INR: 2.3

## 2017-12-07 ENCOUNTER — Other Ambulatory Visit: Payer: Self-pay | Admitting: Family Medicine

## 2017-12-19 ENCOUNTER — Ambulatory Visit (INDEPENDENT_AMBULATORY_CARE_PROVIDER_SITE_OTHER): Payer: Medicare HMO | Admitting: Family Medicine

## 2017-12-19 DIAGNOSIS — I482 Chronic atrial fibrillation, unspecified: Secondary | ICD-10-CM

## 2017-12-19 LAB — POCT INR: INR: 1.5

## 2017-12-19 NOTE — Progress Notes (Signed)
Left message advising of recommendations.  

## 2017-12-19 NOTE — Progress Notes (Signed)
   Subjective:    Patient ID: Jerome Hicks, male    DOB: [birth year], 67 y.o.   MRN: [id number]  HPI  See anticoag flowsheet for changes today.    Review of Systems     Objective:   Physical Exam        Assessment & Plan:

## 2017-12-23 ENCOUNTER — Telehealth: Payer: Self-pay | Admitting: Family Medicine

## 2017-12-23 NOTE — Telephone Encounter (Signed)
Will route to pcp for review.Jerome Hicks [place]

## 2017-12-23 NOTE — Telephone Encounter (Signed)
Pt stated that [place] told him that since he got his 7day of tramadol due to the new law that you can now send him a 90 day prescription of tramadol to the [place]. Thanks

## 2017-12-24 MED ORDER — TRAMADOL HCL 50 MG PO TABS: 50.0000 mg | ORAL_TABLET | Freq: Three times a day (TID) | ORAL | 1 refills | Status: DC | PRN

## 2017-12-24 NOTE — Addendum Note (Signed)
Addended by: Beatrice Lecher D on: 12/24/2017 10:13 AM   Modules accepted: Orders

## 2018-01-02 ENCOUNTER — Ambulatory Visit (INDEPENDENT_AMBULATORY_CARE_PROVIDER_SITE_OTHER): Payer: Medicare HMO | Admitting: Family Medicine

## 2018-01-02 DIAGNOSIS — I482 Chronic atrial fibrillation, unspecified: Secondary | ICD-10-CM

## 2018-01-02 LAB — POCT INR: INR: 2.4

## 2018-01-06 NOTE — Progress Notes (Signed)
Per anti-coag flow sheet "Increase dose. Take whole tab on Thursdays and Mondays. Recheck in 4 weeks." Spoke with Pt, he is already taking that dosage. OK to stay the same? Recheck in 4 weeks? Routing for review.

## 2018-01-07 NOTE — Progress Notes (Signed)
Ok to stay on same dose.

## 2018-01-07 NOTE — Progress Notes (Signed)
Pt advised,verbalized understanding. 

## 2018-01-30 ENCOUNTER — Ambulatory Visit (INDEPENDENT_AMBULATORY_CARE_PROVIDER_SITE_OTHER): Payer: Medicare HMO | Admitting: Family Medicine

## 2018-01-30 ENCOUNTER — Other Ambulatory Visit: Payer: Self-pay | Admitting: *Deleted

## 2018-01-30 DIAGNOSIS — I482 Chronic atrial fibrillation, unspecified: Secondary | ICD-10-CM

## 2018-01-30 LAB — POCT INR: INR: 2.3

## 2018-01-30 MED ORDER — CARVEDILOL 3.125 MG PO TABS: 3.1250 mg | ORAL_TABLET | Freq: Two times a day (BID) | ORAL | 3 refills | Status: DC

## 2018-01-30 MED ORDER — WARFARIN SODIUM 5 MG PO TABS: 5.0000 mg | ORAL_TABLET | Freq: Every day | ORAL | 3 refills | Status: DC

## 2018-01-30 NOTE — Progress Notes (Signed)
   Subjective:    Patient ID: Jerome Hicks, male    DOB: [birth year], 67 y.o.   MRN: [id number]  HPI    Review of Systems     Objective:   Physical Exam        Assessment & Plan:  Here for anticoagulation.

## 2018-02-06 ENCOUNTER — Encounter: Payer: Self-pay | Admitting: Family Medicine

## 2018-02-06 DIAGNOSIS — I77810 Thoracic aortic ectasia: Secondary | ICD-10-CM | POA: Insufficient documentation

## 2018-02-07 ENCOUNTER — Encounter: Payer: Self-pay | Admitting: Sports Medicine

## 2018-02-07 ENCOUNTER — Ambulatory Visit (INDEPENDENT_AMBULATORY_CARE_PROVIDER_SITE_OTHER): Payer: Medicare HMO | Admitting: Sports Medicine

## 2018-02-07 DIAGNOSIS — M1711 Unilateral primary osteoarthritis, right knee: Secondary | ICD-10-CM

## 2018-02-07 NOTE — Progress Notes (Signed)
Subjective:    CC: Right knee pain  HPI: This is a pleasant 67 year old male, he has known right knee osteoarthritis, previous injection was approximately 4 months ago, having a recurrence of pain, medial joint line, a sensation of instability and giving out, moderate, persistent without radiation, desires repeat interventional treatment today.  I reviewed the past medical history, family history, social history, surgical history, and allergies today and no changes were needed.  Please see the problem list section below in epic for further details.  Past Medical History: Past Medical History:  Diagnosis Date  . 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 Atrial fibrillation ([place])   . ED (erectile dysfunction)    has been prescribed Levitra and Viagrain in the past  . Hypertension   . Internal hemorrhoids   . Kidney stones   . Leg edema    recurrent cellulitis  . Scrotal abscess 3-03  . Sigmoid diverticulosis    Past Surgical History: Past Surgical History:  Procedure Laterality Date  .  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 CHOLECYSTECTOMY  1971  . kidney stones  1990's   Ureteroscopic stone extraction  . TONSILLECTOMY     Social History: Social History   Socioeconomic History  . Marital status: Widowed    Spouse name: None  . Number of children: 2  . Years of education: None  . Highest education level: None  Social Needs  . Financial resource strain: None  . Food insecurity - worry: None  . Food insecurity - inability: None  . Transportation needs - medical: None  . Transportation needs - non-medical: None  Occupational History  . Occupation: On disability   Tobacco Use  . Smoking status: Never Smoker  . Smokeless tobacco: Never Used  Substance and Sexual Activity  . Alcohol use: No  . Drug use: No  . Sexual activity: None  Other Topics Concern  . None  Social History Narrative   Staying active   Family History: Family  History  Problem Relation Age of Onset  . Heart attack Mother 28  . Heart attack Father 5  . Heart attack Sister 13  . Heart attack Brother 21  . Multiple sclerosis Daughter    Allergies: Allergies  Allergen Reactions  . Penicillins    Medications: See med rec.  Review of Systems: No fevers, chills, night sweats, weight loss, chest pain, or shortness of breath.   Objective:    General: Well Developed, well nourished, and in no acute distress.  Neuro: Alert and oriented x3, extra-ocular muscles intact, sensation grossly intact.  HEENT: Normocephalic, atraumatic, pupils equal round reactive to light, neck supple, no masses, no lymphadenopathy, thyroid nonpalpable.  Skin: Warm and dry, no rashes. Cardiac: Regular rate and rhythm, no murmurs rubs or gallops, no lower extremity edema.  Respiratory: Clear to auscultation bilaterally. Not using accessory muscles, speaking in full sentences. Right knee: Normal to inspection with no erythe visible and palpable fluid wave with effusion, tenderness at the medial joint line ROM normal in flexion and extension and lower leg rotation. Ligaments with solid consistent endpoints including ACL, PCL, LCL, MCL. Negative Mcmurray's and provocative meniscal tests. Non painful patellar compression. Patellar and quadriceps tendons unremarkable. Hamstring and quadriceps strength is normal.  Procedure: Real-time Ultrasound Guided aspiration/injection of right knee Device: GE Logiq E  Verbal informed consent obtained.  Time-out conducted.  Noted no overlying erythema, induration, or other signs of local infection.  Skin prepped in  a sterile fashion.  Local anesthesia: Topical Ethyl chloride.  With sterile technique and under real time ultrasound guidance: Using an 18-gauge needle I entered the suprapatellar recess and aspirated 15 cc of straw-colored fluid, syringe switched and 1 cc kenalog 40, 2 cc lidocaine, 2 cc bupivacaine injected  easily Completed without difficulty  Pain immediately resolved suggesting accurate placement of the medication.  Advised to call if fevers/chills, erythema, induration, drainage, or persistent bleeding.  Images permanently stored and available for review in the ultrasound unit.  Impression: Technically successful ultrasound guided injection.  Impression and Recommendations:    Primary osteoarthritis of right knee Aspiration and injection, previous procedure was 4 months ago, return as needed. ___________________________________________ Gwen Her. Dianah Field, M.D., ABFM., CAQSM. Primary Care and [place] Instructor of [place] of [facility] of Medicine

## 2018-02-07 NOTE — Assessment & Plan Note (Signed)
Aspiration and injection, previous procedure was 4 months ago, return as needed.

## 2018-02-27 ENCOUNTER — Ambulatory Visit (INDEPENDENT_AMBULATORY_CARE_PROVIDER_SITE_OTHER): Payer: Medicare HMO | Admitting: Family Medicine

## 2018-02-27 DIAGNOSIS — I482 Chronic atrial fibrillation, unspecified: Secondary | ICD-10-CM

## 2018-02-27 LAB — POCT INR: INR: 1.9

## 2018-02-27 NOTE — Progress Notes (Signed)
Called patient - advised of provider's notation of change in directions of taking Warfarin.  Increase dose. Take whole tab on Thursdays and Mondays and Saturdays. Recheck in 2 weeks.   Patient verbally repeated new directions. Patient will call office tomorrow, Friday 02/28/18 to schedule follow up coumadin clinic.

## 2018-03-13 ENCOUNTER — Ambulatory Visit (INDEPENDENT_AMBULATORY_CARE_PROVIDER_SITE_OTHER): Payer: Medicare HMO | Admitting: Family Medicine

## 2018-03-13 DIAGNOSIS — I4891 Unspecified atrial fibrillation: Secondary | ICD-10-CM | POA: Diagnosis not present

## 2018-03-13 LAB — POCT INR: INR: 1.8

## 2018-03-13 NOTE — Progress Notes (Signed)
Left message advising of recommendations.  

## 2018-04-03 ENCOUNTER — Ambulatory Visit (INDEPENDENT_AMBULATORY_CARE_PROVIDER_SITE_OTHER): Payer: Medicare HMO | Admitting: Family Medicine

## 2018-04-03 ENCOUNTER — Encounter: Payer: Self-pay | Admitting: Family Medicine

## 2018-04-03 DIAGNOSIS — I4891 Unspecified atrial fibrillation: Secondary | ICD-10-CM | POA: Diagnosis not present

## 2018-04-03 LAB — POCT INR: INR: 1.9

## 2018-04-03 NOTE — Progress Notes (Signed)
Patient advised of recommendations.  

## 2018-04-09 ENCOUNTER — Encounter: Payer: Self-pay | Admitting: Family Medicine

## 2018-04-09 ENCOUNTER — Ambulatory Visit (INDEPENDENT_AMBULATORY_CARE_PROVIDER_SITE_OTHER): Payer: Medicare HMO | Admitting: Family Medicine

## 2018-04-09 VITALS — BP 126/78 | HR 75 | Ht 75.0 in | Wt 284.0 lb

## 2018-04-09 DIAGNOSIS — Z125 Encounter for screening for malignant neoplasm of prostate: Secondary | ICD-10-CM

## 2018-04-09 DIAGNOSIS — I4891 Unspecified atrial fibrillation: Secondary | ICD-10-CM

## 2018-04-09 DIAGNOSIS — I10 Essential (primary) hypertension: Secondary | ICD-10-CM

## 2018-04-09 DIAGNOSIS — N183 Chronic kidney disease, stage 3 unspecified: Secondary | ICD-10-CM

## 2018-04-09 MED ORDER — TRAMADOL HCL 50 MG PO TABS: 50.0000 mg | ORAL_TABLET | Freq: Two times a day (BID) | ORAL | 1 refills | Status: DC | PRN

## 2018-04-09 NOTE — Progress Notes (Signed)
Subjective:    CC: HTN, Afib.   HPI:  Hypertension- Pt denies chest pain, SOB, dizziness, or heart palpitations.  Taking meds as directed w/o problems.  Denies medication side effects.    F/U Afib -he is on carvedilol for rate control.  Also on an ACE inhibitor.  And on Coumadin for anticoagulation.  He is doing well overall.  No significant complaints of chest pain or shortness of breath.  Follow-up CKD 3-due for six-month recheck on renal function.  He is currently on an ACE inhibitor.  Past medical history, Surgical history, Family history not pertinant except as noted below, Social history, Allergies, and medications have been entered into the medical record, reviewed, and corrections made.   Review of Systems: No fevers, chills, night sweats, weight loss, chest pain, or shortness of breath.   Objective:    General: Well Developed, well nourished, and in no acute distress.  Neuro: Alert and oriented x3, extra-ocular muscles intact, sensation grossly intact.  HEENT: Normocephalic, atraumatic  Skin: Warm and dry, no rashes. Cardiac: Regular rate and rhythm, no murmurs rubs or gallops, no lower extremity edema.  Respiratory: Clear to auscultation bilaterally. Not using accessory muscles, speaking in full sentences.   Impression and Recommendations:    HTN - Well controlled. Continue current regimen. Follow up in  6 months.      Afib - Well controlled. Continue current regimen. Follow up in  5 months.    CKD 3-due to recheck creatinine as well as a urine microalbumin.  Due for screening PSA.

## 2018-04-10 LAB — BASIC METABOLIC PANEL WITH GFR
BUN: 18 mg/dL (ref 7–25)
CALCIUM: 9 mg/dL (ref 8.6–10.3)
CHLORIDE: 104 mmol/L (ref 98–110)
CO2: 29 mmol/L (ref 20–32)
Creat: 0.93 mg/dL (ref 0.70–1.25)
GFR, Est African American: 97 mL/min/{1.73_m2} (ref >=60)
GFR, Est Non African American: 84 mL/min/{1.73_m2} (ref >=60)
Glucose, Bld: 108 mg/dL — ABNORMAL HIGH (ref 65–99)
Potassium: 4.1 mmol/L (ref 3.5–5.3)
Sodium: 141 mmol/L (ref 135–146)

## 2018-04-10 LAB — MICROALBUMIN / CREATININE URINE RATIO
CREATININE, URINE: 260 mg/dL (ref 20–320)
MICROALB UR: 0.4 mg/dL
MICROALB/CREAT RATIO: 2 ug/mg{creat} (ref <30)

## 2018-04-10 LAB — LIPID PANEL
Cholesterol: 130 mg/dL (ref <200)
HDL: 39 mg/dL — ABNORMAL LOW (ref >40)
LDL Cholesterol (Calc): 77 mg/dL (calc)
NON-HDL CHOLESTEROL (CALC): 91 mg/dL (ref <130)
Total CHOL/HDL Ratio: 3.3 (calc) (ref <5.0)
Triglycerides: 65 mg/dL (ref <150)

## 2018-04-10 LAB — PSA: PSA: 2.2 ng/mL (ref <=4.0)

## 2018-04-17 ENCOUNTER — Ambulatory Visit (INDEPENDENT_AMBULATORY_CARE_PROVIDER_SITE_OTHER): Payer: Medicare HMO | Admitting: Physician Assistant

## 2018-04-17 ENCOUNTER — Ambulatory Visit (INDEPENDENT_AMBULATORY_CARE_PROVIDER_SITE_OTHER): Payer: Medicare HMO | Admitting: Sports Medicine

## 2018-04-17 ENCOUNTER — Telehealth: Payer: Self-pay | Admitting: Sports Medicine

## 2018-04-17 DIAGNOSIS — I4891 Unspecified atrial fibrillation: Secondary | ICD-10-CM | POA: Diagnosis not present

## 2018-04-17 DIAGNOSIS — M1711 Unilateral primary osteoarthritis, right knee: Secondary | ICD-10-CM | POA: Diagnosis not present

## 2018-04-17 DIAGNOSIS — M19019 Primary osteoarthritis, unspecified shoulder: Secondary | ICD-10-CM

## 2018-04-17 LAB — POCT INR: INR: 2.3

## 2018-04-17 MED ORDER — DICLOFENAC SODIUM 1 % TD GEL: 4.0000 g | Freq: Four times a day (QID) | TRANSDERMAL | 11 refills | Status: DC

## 2018-04-17 NOTE — Telephone Encounter (Signed)
-----   Message from Tasia Catchings, [place] sent at 04/17/2018 11:28 AM EDT ----- Information has been submitted to Orthovisc and awaiting determination.   ----- Message ----- From: Silverio Decamp, MD Sent: 04/17/2018   9:01 AM To: Tasia Catchings, CMA  Orthovisc approval please, right knee, x-ray confirmed, failed steroid injections, cannot use oral NSAIDs. ___________________________________________ Gwen Her. Dianah Field, M.D., ABFM., CAQSM. Primary Care and [place] Instructor of [place] of [facility] of Medicine

## 2018-04-17 NOTE — Progress Notes (Signed)
Subjective:    CC: Bilateral shoulder pain  HPI: This is a pleasant 67 year old male, he has known glenohumeral osteoarthritis, having recurrence of pain, previous injection was 8 months ago.  Pain is localized at the joint lines of both shoulders, moderate, persistent without radiation.  Right knee osteoarthritis: Steroid injection about 6 weeks ago, did not really get much relief, pain is moderate, persistent, localized to the joint lines.  I reviewed the past medical history, family history, social history, surgical history, and allergies today and no changes were needed.  Please see the problem list section below in epic for further details.  Past Medical History: Past Medical History:  Diagnosis Date  . 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 Atrial fibrillation ([place])   . ED (erectile dysfunction)    has been prescribed Levitra and Viagrain in the past  . Hypertension   . Internal hemorrhoids   . Kidney stones   . Leg edema    recurrent cellulitis  . Scrotal abscess 3-03  . Sigmoid diverticulosis    Past Surgical History: Past Surgical History:  Procedure Laterality Date  .  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 CHOLECYSTECTOMY  1971  . kidney stones  1990's   Ureteroscopic stone extraction  . TONSILLECTOMY     Social History: Social History   Socioeconomic History  . Marital status: Widowed    Spouse name: Not on file  . Number of children: 2  . Years of education: Not on file  . Highest education level: Not on file  Occupational History  . Occupation: On disability   Social Needs  . Financial resource strain: Not on file  . Food insecurity:    Worry: Not on file    Inability: Not on file  . Transportation needs:    Medical: Not on file    Non-medical: Not on file  Tobacco Use  . Smoking status: Never Smoker  . Smokeless tobacco: Never Used  Substance and Sexual Activity  . Alcohol use: No  . Drug use: No  . Sexual  activity: Not on file  Lifestyle  . Physical activity:    Days per week: Not on file    Minutes per session: Not on file  . Stress: Not on file  Relationships  . Social connections:    Talks on phone: Not on file    Gets together: Not on file    Attends religious service: Not on file    Active member of club or organization: Not on file    Attends meetings of clubs or organizations: Not on file    Relationship status: Not on file  Other Topics Concern  . Not on file  Social History Narrative   Staying active   Family History: Family History  Problem Relation Age of Onset  . Heart attack Mother 9  . Heart attack Father 3  . Heart attack Sister 53  . Heart attack Brother 33  . Multiple sclerosis Daughter    Allergies: Allergies  Allergen Reactions  . Penicillins    Medications: See med rec.  Review of Systems: No fevers, chills, night sweats, weight loss, chest pain, or shortness of breath.   Objective:    General: Well Developed, well nourished, and in no acute distress.  Neuro: Alert and oriented x3, extra-ocular muscles intact, sensation grossly intact.  HEENT: Normocephalic, atraumatic, pupils equal round reactive to light, neck supple, no masses, no lymphadenopathy, thyroid nonpalpable.  Skin:  Warm and dry, no rashes. Cardiac: Regular rate and rhythm, no murmurs rubs or gallops, no lower extremity edema.  Respiratory: Clear to auscultation bilaterally. Not using accessory muscles, speaking in full sentences.  Procedure: Real-time Ultrasound Guided Injection of left glenohumeral joint Device: GE Logiq E  Verbal informed consent obtained.  Time-out conducted.  Noted no overlying erythema, induration, or other signs of local infection.  Skin prepped in a sterile fashion.  Local anesthesia: Topical Ethyl chloride.  With sterile technique and under real time ultrasound guidance: Using a 22-gauge spinal needle advanced into the lateral humeral joint from a  posterior approach and injected 1 cc kenalog 40, 2 cc lidocaine, 2 cc bupivacaine Completed without difficulty  Pain immediately resolved suggesting accurate placement of the medication.  Advised to call if fevers/chills, erythema, induration, drainage, or persistent bleeding.  Images permanently stored and available for review in the ultrasound unit.  Impression: Technically successful ultrasound guided injection.  Procedure: Real-time Ultrasound Guided Injection of right glenohumeral joint Device: GE Logiq E  Verbal informed consent obtained.  Time-out conducted.  Noted no overlying erythema, induration, or other signs of local infection.  Skin prepped in a sterile fashion.  Local anesthesia: Topical Ethyl chloride.  With sterile technique and under real time ultrasound guidance: Using a 22-gauge spinal needle advanced into the lateral humeral joint from a posterior approach and injected 1 cc kenalog 40, 2 cc lidocaine, 2 cc bupivacaine Completed without difficulty  Pain immediately resolved suggesting accurate placement of the medication.  Advised to call if fevers/chills, erythema, induration, drainage, or persistent bleeding.  Images permanently stored and available for review in the ultrasound unit.  Impression: Technically successful ultrasound guided injection.  Impression and Recommendations:    Glenohumeral arthritis of both shoulders Previous injection was in September 2018, repeat bilateral glenohumeral injections.  Primary osteoarthritis of right knee X-ray confirmed osteoarthritis, failed steroid injection 6 weeks ago. Rehab exercises given, working on approval for Orthovisc for the right knee. Adding topical Voltaren.   ___________________________________________ Gwen Her. Dianah Field, M.D., ABFM., CAQSM. Primary Care and [place] Instructor of [place] of [facility] of Medicine

## 2018-04-17 NOTE — Progress Notes (Signed)
Same dose  Recheck 4 weeks

## 2018-04-17 NOTE — Assessment & Plan Note (Signed)
X-ray confirmed osteoarthritis, failed steroid injection 6 weeks ago. Rehab exercises given, working on approval for Orthovisc for the right knee. Adding topical Voltaren.

## 2018-04-17 NOTE — Assessment & Plan Note (Signed)
Previous injection was in September 2018, repeat bilateral glenohumeral injections.

## 2018-04-22 NOTE — Telephone Encounter (Signed)
[place] patient called back regarding Orthovisc information. I read pt your phone note advising of copay info and he still has a couple questions and would like you to call him back today if possible. Thanks [phone]

## 2018-04-22 NOTE — Telephone Encounter (Signed)
Orthovisc is covered. A copay applies for the administration. This also covers the office visit copay if collected. The copay amount is $35. After the deductible has been met, the patient's responsibility will be 20% of the allowable amount for [id number]. Once the out of pocket is met, the patient will have no financial responsibility. Call reference number is [id number].   Left message for patient to call back.

## 2018-04-22 NOTE — Telephone Encounter (Signed)
Called patient and I have mailed patient assistance forms to address on file. Patient will fill out and send back to our office. Closing encounter.

## 2018-05-19 ENCOUNTER — Telehealth: Payer: Self-pay | Admitting: Sports Medicine

## 2018-05-19 ENCOUNTER — Ambulatory Visit (INDEPENDENT_AMBULATORY_CARE_PROVIDER_SITE_OTHER): Payer: Medicare HMO | Admitting: Family Medicine

## 2018-05-19 DIAGNOSIS — I4891 Unspecified atrial fibrillation: Secondary | ICD-10-CM

## 2018-05-19 LAB — POCT INR: INR: 2.5 (ref 2.0–3.0)

## 2018-05-19 NOTE — Telephone Encounter (Signed)
Patient was in the office for a nurse visit today and he stopped by on his way out and said he was not approved for patient assistance for Orthovisc. The patient wants to know if we can try for another injectable medication that may be cheaper injectable such as Monovisc or Synvisc One. Please advise.

## 2018-05-19 NOTE — Telephone Encounter (Signed)
They are all extremely expensive. :-(

## 2018-05-19 NOTE — Telephone Encounter (Signed)
Patient reports that he was not approved for patient assistance.

## 2018-05-19 NOTE — Telephone Encounter (Signed)
He can come in and I can do another half dose injection.  Another option would be to try to do a platelet rich plasma injection intra-articular.  It is not covered by insurance but would be cheaper than Visco supplementation.

## 2018-05-19 NOTE — Telephone Encounter (Signed)
I called patient and advised him about your note below. He is still requesting some type of injection for relief since his knee pain is getting worse. Please advise.

## 2018-05-20 ENCOUNTER — Ambulatory Visit (INDEPENDENT_AMBULATORY_CARE_PROVIDER_SITE_OTHER): Payer: Medicare HMO | Admitting: Sports Medicine

## 2018-05-20 ENCOUNTER — Encounter: Payer: Self-pay | Admitting: Sports Medicine

## 2018-05-20 DIAGNOSIS — M1711 Unilateral primary osteoarthritis, right knee: Secondary | ICD-10-CM

## 2018-05-20 NOTE — Progress Notes (Signed)
   Procedure: Real-time Ultrasound Guided Injection of right knee Device: GE Logiq E  Verbal informed consent obtained.  Time-out conducted.  Noted no overlying erythema, induration, or other signs of local infection.  Skin prepped in a sterile fashion.  Local anesthesia: Topical Ethyl chloride.  With sterile technique and under real time ultrasound guidance: 88 mg/4 mL of MonoVisc (sodium hyaluronate) in a prefilled syringe was injected easily into the knee through a 22-gauge needle. Completed without difficulty  Pain immediately resolved suggesting accurate placement of the medication.  Advised to call if fevers/chills, erythema, induration, drainage, or persistent bleeding.  Images permanently stored and available for review in the ultrasound unit.  Impression: Technically successful ultrasound guided injection.

## 2018-05-20 NOTE — Assessment & Plan Note (Signed)
Monovisc injection #1 of 1 to the right knee, return in 1 month.

## 2018-05-20 NOTE — Telephone Encounter (Signed)
Monovisc is covered. A copay applies for the administration, whether or not an office visit copay is collected, in the amount of $35.00. If collected, the office visit copay is $35.00. The deductible does not apply. The patient has a coinsurance of 20% of the allowable amount towards the Jcode. Once the out of pocket is met, the patient will have no financial responsibility. Call reference number is [id number].   Patient wants to go ahead with Monovisc Supplementation. The patients part is less than Orthovisc.

## 2018-05-20 NOTE — Progress Notes (Signed)
Per patient he is currently taking 0.5mg  on Sunday, Tuesday and Friday. He is taking 5 mg all other days. Patient was notified that he should continue the same dose schedule. Patient did not have any further questions at this time.

## 2018-05-20 NOTE — Telephone Encounter (Signed)
Per Dr. Dianah Field ok to double book at 3 pm today for R knee injection.-HSM,.

## 2018-05-30 ENCOUNTER — Telehealth: Payer: Self-pay | Admitting: Family Medicine

## 2018-05-30 DIAGNOSIS — I10 Essential (primary) hypertension: Secondary | ICD-10-CM

## 2018-05-30 MED ORDER — DOXAZOSIN MESYLATE 4 MG PO TABS: 4.0000 mg | ORAL_TABLET | Freq: Every day | ORAL | 1 refills | Status: DC

## 2018-05-30 MED ORDER — LISINOPRIL 10 MG PO TABS: 10.0000 mg | ORAL_TABLET | Freq: Every day | ORAL | 1 refills | Status: DC

## 2018-05-30 MED ORDER — POTASSIUM CHLORIDE CRYS ER 10 MEQ PO TBCR
10.0000 meq | EXTENDED_RELEASE_TABLET | Freq: Every day | ORAL | 1 refills | Status: DC
Start: 2018-05-30 — End: 2018-07-29

## 2018-05-30 MED ORDER — FUROSEMIDE 40 MG PO TABS: 40.0000 mg | ORAL_TABLET | Freq: Every day | ORAL | 1 refills | Status: DC | PRN

## 2018-05-30 NOTE — Telephone Encounter (Signed)
Called pt to inform him that his meds have been sent.Elouise Munroe, [place]

## 2018-05-30 NOTE — Telephone Encounter (Signed)
Pt called. He needs  refill on Furosemide, Potassium, Lisinopril and Doxazosine.  He's almost out of meds and wants to make sure they are called today.

## 2018-05-30 NOTE — Telephone Encounter (Signed)
Rx's were sent. Pt advised.

## 2018-06-03 ENCOUNTER — Encounter: Payer: Self-pay | Admitting: Family Medicine

## 2018-06-03 ENCOUNTER — Ambulatory Visit (INDEPENDENT_AMBULATORY_CARE_PROVIDER_SITE_OTHER): Payer: Medicare HMO | Admitting: Family Medicine

## 2018-06-03 VITALS — BP 128/80 | HR 72 | Ht 75.0 in | Wt 285.0 lb

## 2018-06-03 DIAGNOSIS — Z0000 Encounter for general adult medical examination without abnormal findings: Secondary | ICD-10-CM

## 2018-06-03 NOTE — Patient Instructions (Addendum)
If you are interested in getting the new shingles vaccine please speak to your pharmacist.  See. Handout.    Call your [facility] and see if they will cover your shingles vaccine at the doctor's office.   Walk for 5 minutes several times through out the day.

## 2018-06-03 NOTE — Progress Notes (Signed)
Subjective:   Jerome Hicks is a 67 y.o. male who presents for Medicare Annual/Subsequent preventive examination.  Review of Systems:  Points of review of systems is negative.       Objective:    Vitals: BP 128/80   Pulse 72   Ht 6\' 3"  (1.905 m)   Wt 285 lb (129.3 kg)   SpO2 99% Comment: on RA  BMI 35.62 kg/m   Body mass index is 35.62 kg/m.  Advanced Directives 04/18/2015 04/15/2014 03/18/2014  Does Patient Have a Medical Advance Directive? Yes Patient has advance directive, copy not in chart Patient does not have advance directive;Patient would like information  Copy of [place] in Chart? Yes Copy requested from family -  Would patient like information on creating a medical advance directive? - - Advance directive packet given    Tobacco Social History   Tobacco Use  Smoking Status Never Smoker  Smokeless Tobacco Never Used     Counseling given: Not Answered   Clinical Intake:    Physical Exam  Constitutional: He is oriented to person, place, and time. He appears well-developed and well-nourished.  HENT:  Head: Normocephalic and atraumatic.  Right Ear: External ear normal.  Left Ear: External ear normal.  Nose: Nose normal.  Mouth/Throat: Oropharynx is clear and moist.  Eyes: Pupils are equal, round, and reactive to light. Conjunctivae and EOM are normal.  Neck: Normal range of motion. Neck supple. No thyromegaly present.  Cardiovascular: Normal rate, regular rhythm, normal heart sounds and intact distal pulses.  Pulmonary/Chest: Effort normal and breath sounds normal.  Abdominal: Soft. Bowel sounds are normal. He exhibits no distension and no mass. There is no tenderness. There is no rebound and no guarding.  Musculoskeletal: Normal range of motion.  Lymphadenopathy:    He has no cervical adenopathy.  Neurological: He is alert and oriented to person, place, and time. He has normal reflexes.  Skin: Skin is warm and dry.  Psychiatric: He  has a normal mood and affect. His behavior is normal. Judgment and thought content normal.                   Past Medical History:  Diagnosis Date  . 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 Atrial fibrillation ([place])   . ED (erectile dysfunction)    has been prescribed Levitra and Viagrain in the past  . Hypertension   . Internal hemorrhoids   . Kidney stones   . Leg edema    recurrent cellulitis  . Scrotal abscess 3-03  . Sigmoid diverticulosis    Past Surgical History:  Procedure Laterality Date  .  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 CHOLECYSTECTOMY  1971  . kidney stones  1990's   Ureteroscopic stone extraction  . TONSILLECTOMY     Family History  Problem Relation Age of Onset  . Heart attack Mother 35  . Heart attack Father 25  . Heart attack Sister 21  . Heart attack Brother 1  . Multiple sclerosis Daughter    Social History   Socioeconomic History  . Marital status: Widowed    Spouse name: Not on file  . Number of children: 2  . Years of education: Not on file  . Highest education level: Not on file  Occupational History  . Occupation: On disability   Social Needs  . Financial resource strain: Not on file  . Food insecurity:    Worry:  Not on file    Inability: Not on file  . Transportation needs:    Medical: Not on file    Non-medical: Not on file  Tobacco Use  . Smoking status: Never Smoker  . Smokeless tobacco: Never Used  Substance and Sexual Activity  . Alcohol use: No  . Drug use: No  . Sexual activity: Not on file  Lifestyle  . Physical activity:    Days per week: Not on file    Minutes per session: Not on file  . Stress: Not on file  Relationships  . Social connections:    Talks on phone: Not on file    Gets together: Not on file    Attends religious service: Not on file    Active member of club or organization: Not on file    Attends meetings of clubs or organizations: Not on file     Relationship status: Not on file  Other Topics Concern  . Not on file  Social History Narrative   Staying active    Outpatient Encounter Medications as of 06/03/2018  Medication Sig  . carvedilol (COREG) 3.125 MG tablet Take 1 tablet (3.125 mg total) by mouth 2 (two) times daily with a meal.  . clotrimazole-betamethasone (LOTRISONE) cream Apply 1 application topically.  . doxazosin (CARDURA) 4 MG tablet Take 1 tablet (4 mg total) by mouth daily.  . furosemide (LASIX) 40 MG tablet Take 1-2 tablets (40-80 mg total) by mouth daily as needed for fluid or edema.  Marland Kitchen lisinopril (PRINIVIL,ZESTRIL) 10 MG tablet Take 1 tablet (10 mg total) by mouth daily.  . meclizine (ANTIVERT) 25 MG tablet TAKE 1 TABLET THREE TIMES DAILY AS NEEDED  FOR  DIZZINESS  OR  NAUSEA  AS DIRECTED  . potassium chloride (K-DUR,KLOR-CON) 10 MEQ tablet Take 1 tablet (10 mEq total) by mouth daily.  . traMADol (ULTRAM) 50 MG tablet Take 1-2 tablets (50-100 mg total) by mouth 2 (two) times daily as needed.  . warfarin (COUMADIN) 5 MG tablet Take 1 tablet (5 mg total) by mouth daily.  . [DISCONTINUED] diclofenac sodium (VOLTAREN) 1 % GEL Apply 4 g topically 4 (four) times daily. To affected joint.   No facility-administered encounter medications on file as of 06/03/2018.     Activities of Daily Living In your present state of health, do you have any difficulty performing the following activities: 06/03/2018  Hearing? Y  Vision? N  Difficulty concentrating or making decisions? N  Walking or climbing stairs? N  Dressing or bathing? N  Doing errands, shopping? N  Some recent data might be hidden    Patient Care Team: Hali Marry, MD as PCP - General   Assessment:   This is a routine wellness examination for Jerome Hicks.  Exercise Activities and Dietary recommendations Current Exercise Habits: The patient does not participate in regular exercise at present, Exercise limited by: orthopedic condition(s)(knee OA)  Goals     None      Fall Risk Fall Risk  06/03/2018 04/09/2018 04/25/2017 07/01/2015 05/26/2013  Falls in the past year? No No No No No   Depression Screen PHQ 2/9 Scores 06/03/2018 04/09/2018 04/25/2017 07/01/2015  PHQ - 2 Score 0 0 0 0  PHQ- 9 Score 3 - - -    Cognitive Function     6CIT Screen 06/03/2018 04/25/2017  What Year? 0 points 0 points  What month? 0 points 0 points  What time? 0 points 0 points  Count back from 20 0 points 0  points  Months in reverse 0 points 0 points  Repeat phrase 4 points 6 points  Total Score 4 6    Immunization History  Administered Date(s) Administered  . Influenza Split 08/21/2012  . Influenza Whole 08/27/2008, 11/07/2009, 10/18/2010, 08/20/2011  . Influenza, High Dose Seasonal PF 08/26/2017  . Influenza,inj,Quad PF,6+ Mos 08/25/2013, 08/05/2014, 08/31/2015, 08/28/2016  . Pneumococcal Conjugate-13 09/29/2013  . Pneumococcal Polysaccharide-23 07/14/2015  . Td 12/03/1994, 06/15/2009  . Zoster 06/18/2011    Qualifies for Shingles Vaccine? Yes  Screening Tests Health Maintenance  Topic Date Due  . INFLUENZA VACCINE  07/03/2018  . COLONOSCOPY  05/26/2019  . TETANUS/TDAP  06/16/2019  . Hepatitis C Screening  Completed  . PNA vac Low Risk Adult  Completed   Cancer Screenings: Lung: Low Dose CT Chest recommended if Age 7-80 years, 30 pack-year currently smoking OR have quit w/in 15years. Patient does not qualify. Colorectal: done 2017  Additional Screenings:    Hepatitis C Screening:      Plan:   Medicare WEllness exam   I have personally reviewed and noted the following in the patient's chart:   . Medical and social history . Use of alcohol, tobacco or illicit drugs  . Current medications and supplements -updated. . Functional ability and status . Nutritional status . Physical activity -right now physical activity is very minimal due to his knee pain.  He just had an injection 2 weeks ago and will likely end up needing knee replacement probably  in the next year or 2.  We discussed some strategies around staying active. . Advanced directives -yes. . List of other physicians . Hospitalizations, surgeries, and ER visits in previous 12 months . Vitals . Screenings to include cognitive, depression, and falls . Referrals and appointments  In addition, I have reviewed and discussed with patient certain preventive protocols, quality metrics, and best practice recommendations. A written personalized care plan for preventive services as well as general preventive health recommendations were provided to patient.     Beatrice Lecher, MD  06/03/2018

## 2018-06-16 ENCOUNTER — Ambulatory Visit (INDEPENDENT_AMBULATORY_CARE_PROVIDER_SITE_OTHER): Payer: Medicare HMO | Admitting: Sports Medicine

## 2018-06-16 ENCOUNTER — Encounter: Payer: Self-pay | Admitting: Sports Medicine

## 2018-06-16 ENCOUNTER — Ambulatory Visit (INDEPENDENT_AMBULATORY_CARE_PROVIDER_SITE_OTHER): Payer: Medicare HMO | Admitting: Family Medicine

## 2018-06-16 DIAGNOSIS — Z7901 Long term (current) use of anticoagulants: Secondary | ICD-10-CM

## 2018-06-16 DIAGNOSIS — Z5181 Encounter for therapeutic drug level monitoring: Secondary | ICD-10-CM

## 2018-06-16 DIAGNOSIS — M1711 Unilateral primary osteoarthritis, right knee: Secondary | ICD-10-CM | POA: Diagnosis not present

## 2018-06-16 DIAGNOSIS — I4891 Unspecified atrial fibrillation: Secondary | ICD-10-CM | POA: Diagnosis not present

## 2018-06-16 LAB — POCT INR: INR: 2.5 (ref 2.0–3.0)

## 2018-06-16 NOTE — Assessment & Plan Note (Signed)
At this point we have done steroid injections, activity modification, oral medications, at the last visit we did a Monovisc injection, nothing is really worked, I do think it is time to consider knee arthroplasty. Referral to Dr. Berenice Primas, he can also increase his tramadol up to 3 times daily for now.

## 2018-06-16 NOTE — Progress Notes (Signed)
Agree with documentation as above.   Kason Benak, MD  

## 2018-06-16 NOTE — Progress Notes (Signed)
Subjective:    CC: Follow-up  HPI: Jerome Hicks is a pleasant 67 year old male, we have done multiple modalities in the hopes of getting his knee better, continues to have pain, moderate, persistent, localized to the medial and lateral joint lines, he is failed physical therapy, activity modification, NSAIDs, steroid injections, and more recently a Monovisc injection at the last visit.  I reviewed the past medical history, family history, social history, surgical history, and allergies today and no changes were needed.  Please see the problem list section below in epic for further details.  Past Medical History: Past Medical History:  Diagnosis Date  . 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 Atrial fibrillation ([place])   . ED (erectile dysfunction)    has been prescribed Levitra and Viagrain in the past  . Hypertension   . Internal hemorrhoids   . Kidney stones   . Leg edema    recurrent cellulitis  . Scrotal abscess 3-03  . Sigmoid diverticulosis    Past Surgical History: Past Surgical History:  Procedure Laterality Date  .  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 CHOLECYSTECTOMY  1971  . kidney stones  1990's   Ureteroscopic stone extraction  . TONSILLECTOMY     Social History: Social History   Socioeconomic History  . Marital status: Widowed    Spouse name: Not on file  . Number of children: 2  . Years of education: Not on file  . Highest education level: Not on file  Occupational History  . Occupation: On disability   Social Needs  . Financial resource strain: Not on file  . Food insecurity:    Worry: Not on file    Inability: Not on file  . Transportation needs:    Medical: Not on file    Non-medical: Not on file  Tobacco Use  . Smoking status: Never Smoker  . Smokeless tobacco: Never Used  Substance and Sexual Activity  . Alcohol use: No  . Drug use: No  . Sexual activity: Not on file  Lifestyle  . Physical activity:    Days  per week: Not on file    Minutes per session: Not on file  . Stress: Not on file  Relationships  . Social connections:    Talks on phone: Not on file    Gets together: Not on file    Attends religious service: Not on file    Active member of club or organization: Not on file    Attends meetings of clubs or organizations: Not on file    Relationship status: Not on file  Other Topics Concern  . Not on file  Social History Narrative   Staying active   Family History: Family History  Problem Relation Age of Onset  . Heart attack Mother 33  . Heart attack Father 67  . Heart attack Sister 83  . Heart attack Brother 55  . Multiple sclerosis Daughter    Allergies: Allergies  Allergen Reactions  . Penicillins    Medications: See med rec.  Review of Systems: No fevers, chills, night sweats, weight loss, chest pain, or shortness of breath.   Objective:    General: Well Developed, well nourished, and in no acute distress.  Neuro: Alert and oriented x3, extra-ocular muscles intact, sensation grossly intact.  HEENT: Normocephalic, atraumatic, pupils equal round reactive to light, neck supple, no masses, no lymphadenopathy, thyroid nonpalpable.  Skin: Warm and dry, no rashes. Cardiac: Regular rate and rhythm,  no murmurs rubs or gallops, no lower extremity edema.  Respiratory: Clear to auscultation bilaterally. Not using accessory muscles, speaking in full sentences.  Impression and Recommendations:    Primary osteoarthritis of right knee At this point we have done steroid injections, activity modification, oral medications, at the last visit we did a Monovisc injection, nothing is really worked, I do think it is time to consider knee arthroplasty. Referral to Dr. Berenice Primas, he can also increase his tramadol up to 3 times daily for now. ___________________________________________ Gwen Her. Dianah Field, M.D., ABFM., CAQSM. Primary Care and [place] Instructor of [place] [employer] [facility] [employer] [facility]

## 2018-07-01 DIAGNOSIS — M25561 Pain in right knee: Secondary | ICD-10-CM | POA: Diagnosis not present

## 2018-07-17 ENCOUNTER — Other Ambulatory Visit: Payer: Self-pay | Admitting: Orthopedic Surgery

## 2018-07-17 ENCOUNTER — Ambulatory Visit (INDEPENDENT_AMBULATORY_CARE_PROVIDER_SITE_OTHER): Payer: Medicare HMO | Admitting: Family Medicine

## 2018-07-17 DIAGNOSIS — I4891 Unspecified atrial fibrillation: Secondary | ICD-10-CM

## 2018-07-17 LAB — POCT INR: INR: 3 (ref 2.0–3.0)

## 2018-07-24 ENCOUNTER — Encounter (HOSPITAL_COMMUNITY): Payer: Self-pay

## 2018-07-24 NOTE — Patient Instructions (Signed)
Jerome Hicks  07/24/2018   Your procedure is scheduled on: 08-01-18   Report to [facility] Main  Entrance    Report to admitting at 8:00AM    Call this number if you have problems the morning of surgery [phone]     Remember: Do not eat food or drink liquids :After Midnight.     Take these medicines the morning of surgery with A SIP OF WATER: carvedilol, doxazosin                                 You may not have any metal on your body including hair pins and              piercings  Do not wear jewelry, make-up, lotions, powders or perfumes, deodorant                    Men may shave face and neck.   Do not bring valuables to the hospital. [place].  Contacts, dentures or bridgework may not be worn into surgery.  Leave suitcase in the car. After surgery it may be brought to your room.                 Please read over the following fact sheets you were given: _____________________________________________________________________             [facility] - Preparing for Surgery Before surgery, you can play an important role.  Because skin is not sterile, your skin needs to be as free of germs as possible.  You can reduce the number of germs on your skin by washing with CHG (chlorahexidine gluconate) soap before surgery.  CHG is an antiseptic cleaner which kills germs and bonds with the skin to continue killing germs even after washing. Please DO NOT use if you have an allergy to CHG or antibacterial soaps.  If your skin becomes reddened/irritated stop using the CHG and inform your nurse when you arrive at Short Stay. Do not shave (including legs and underarms) for at least 48 hours prior to the first CHG shower.  You may shave your face/neck. Please follow these instructions carefully:  1.  Shower with CHG Soap the night before surgery and the  morning of Surgery.  2.  If you choose to wash your  hair, wash your hair first as usual with your  normal  shampoo.  3.  After you shampoo, rinse your hair and body thoroughly to remove the  shampoo.                           4.  Use CHG as you would any other liquid soap.  You can apply chg directly  to the skin and wash                       Gently with a scrungie or clean washcloth.  5.  Apply the CHG Soap to your body ONLY FROM THE NECK DOWN.   Do not use on face/ open  Wound or open sores. Avoid contact with eyes, ears mouth and genitals (private parts).                       Wash face,  Genitals (private parts) with your normal soap.             6.  Wash thoroughly, paying special attention to the area where your surgery  will be performed.  7.  Thoroughly rinse your body with warm water from the neck down.  8.  DO NOT shower/wash with your normal soap after using and rinsing off  the CHG Soap.                9.  Pat yourself dry with a clean towel.            10.  Wear clean pajamas.            11.  Place clean sheets on your bed the night of your first shower and do not  sleep with pets. Day of Surgery : Do not apply any lotions/deodorants the morning of surgery.  Please wear clean clothes to the hospital/surgery center.  FAILURE TO FOLLOW THESE INSTRUCTIONS MAY RESULT IN THE CANCELLATION OF YOUR SURGERY PATIENT SIGNATURE_________________________________  NURSE SIGNATURE__________________________________  ________________________________________________________________________   Jerome Hicks  An incentive spirometer is a tool that can help keep your lungs clear and active. This tool measures how well you are filling your lungs with each breath. Taking long deep breaths may help reverse or decrease the chance of developing breathing (pulmonary) problems (especially infection) following:  A long period of time when you are unable to move or be active. BEFORE THE PROCEDURE   If the spirometer  includes an indicator to show your best effort, your nurse or respiratory therapist will set it to a desired goal.  If possible, sit up straight or lean slightly forward. Try not to slouch.  Hold the incentive spirometer in an upright position. INSTRUCTIONS FOR USE  1. Sit on the edge of your bed if possible, or sit up as far as you can in bed or on a chair. 2. Hold the incentive spirometer in an upright position. 3. Breathe out normally. 4. Place the mouthpiece in your mouth and seal your lips tightly around it. 5. Breathe in slowly and as deeply as possible, raising the piston or the ball toward the top of the column. 6. Hold your breath for 3-5 seconds or for as long as possible. Allow the piston or ball to fall to the bottom of the column. 7. Remove the mouthpiece from your mouth and breathe out normally. 8. Rest for a few seconds and repeat Steps 1 through 7 at least 10 times every 1-2 hours when you are awake. Take your time and take a few normal breaths between deep breaths. 9. The spirometer may include an indicator to show your best effort. Use the indicator as a goal to work toward during each repetition. 10. After each set of 10 deep breaths, practice coughing to be sure your lungs are clear. If you have an incision (the cut made at the time of surgery), support your incision when coughing by placing a pillow or rolled up towels firmly against it. Once you are able to get out of bed, walk around indoors and cough well. You may stop using the incentive spirometer when instructed by your caregiver.  RISKS AND COMPLICATIONS  Take your time so you do not get  dizzy or light-headed.  If you are in pain, you may need to take or ask for pain medication before doing incentive spirometry. It is harder to take a deep breath if you are having pain. AFTER USE  Rest and breathe slowly and easily.  It can be helpful to keep track of a log of your progress. Your caregiver can provide you with a  simple table to help with this. If you are using the spirometer at home, follow these instructions: [place] IF:   You are having difficultly using the spirometer.  You have trouble using the spirometer as often as instructed.  Your pain medication is not giving enough relief while using the spirometer.  You develop fever of 100.5 F (38.1 C) or higher. SEEK IMMEDIATE MEDICAL CARE IF:   You cough up bloody sputum that had not been present before.  You develop fever of 102 F (38.9 C) or greater.  You develop worsening pain at or near the incision site. MAKE SURE YOU:   Understand these instructions.  Will watch your condition.  Will get help right away if you are not doing well or get worse. Document Released: 04/01/2007 Document Revised: 02/11/2012 Document Reviewed: 06/02/2007 [facility] Patient Information 2014 [place], [place].   ________________________________________________________________________

## 2018-07-25 ENCOUNTER — Ambulatory Visit (HOSPITAL_COMMUNITY)
Admission: RE | Admit: 2018-07-25 | Discharge: 2018-07-25 | Disposition: A | Discharge status: Home / Self Care | Payer: Medicare HMO | Source: Ambulatory Visit | Attending: Orthopedic Surgery | Admitting: Orthopedic Surgery

## 2018-07-25 ENCOUNTER — Encounter (HOSPITAL_COMMUNITY): Payer: Self-pay

## 2018-07-25 ENCOUNTER — Other Ambulatory Visit: Payer: Self-pay

## 2018-07-25 ENCOUNTER — Encounter (HOSPITAL_COMMUNITY)
Admission: RE | Admit: 2018-07-25 | Discharge: 2018-07-25 | Disposition: A | Discharge status: Home / Self Care | Payer: Medicare HMO | Source: Ambulatory Visit | Attending: Orthopedic Surgery | Admitting: Orthopedic Surgery

## 2018-07-25 DIAGNOSIS — Z01818 Encounter for other preprocedural examination: Secondary | ICD-10-CM

## 2018-07-25 DIAGNOSIS — Z01812 Encounter for preprocedural laboratory examination: Secondary | ICD-10-CM | POA: Diagnosis not present

## 2018-07-25 DIAGNOSIS — Z01810 Encounter for preprocedural cardiovascular examination: Secondary | ICD-10-CM | POA: Insufficient documentation

## 2018-07-25 DIAGNOSIS — I4891 Unspecified atrial fibrillation: Secondary | ICD-10-CM | POA: Insufficient documentation

## 2018-07-25 DIAGNOSIS — M1711 Unilateral primary osteoarthritis, right knee: Secondary | ICD-10-CM | POA: Insufficient documentation

## 2018-07-25 DIAGNOSIS — R0989 Other specified symptoms and signs involving the circulatory and respiratory systems: Secondary | ICD-10-CM | POA: Diagnosis not present

## 2018-07-25 HISTORY — DX: Aneurysm of the ascending aorta, without rupture: I71.21

## 2018-07-25 HISTORY — DX: Unspecified cataract: H26.9

## 2018-07-25 HISTORY — DX: Unspecified osteoarthritis, unspecified site: M19.90

## 2018-07-25 HISTORY — DX: Thoracic aortic aneurysm, without rupture: I71.2

## 2018-07-25 HISTORY — DX: Primary osteoarthritis, unspecified shoulder: M19.019

## 2018-07-25 LAB — BASIC METABOLIC PANEL
Anion gap: 7 (ref 5–15)
BUN: 18 mg/dL (ref 8–23)
CHLORIDE: 106 mmol/L (ref 98–111)
CO2: 30 mmol/L (ref 22–32)
CREATININE: 0.88 mg/dL (ref 0.61–1.24)
Calcium: 9.4 mg/dL (ref 8.9–10.3)
GFR calc Af Amer: >60 mL/min (ref >60)
GFR calc non Af Amer: >60 mL/min (ref >60)
GLUCOSE: 111 mg/dL — AB (ref 70–99)
Potassium: 4.5 mmol/L (ref 3.5–5.1)
Sodium: 143 mmol/L (ref 135–145)

## 2018-07-25 LAB — CBC WITH DIFFERENTIAL/PLATELET
Basophils Absolute: 0 10*3/uL (ref 0.0–0.1)
Basophils Relative: 1 %
EOS PCT: 3 %
Eosinophils Absolute: 0.2 10*3/uL (ref 0.0–0.7)
HCT: 43.3 % (ref 39.0–52.0)
Hemoglobin: 14.7 g/dL (ref 13.0–17.0)
LYMPHS ABS: 2 10*3/uL (ref 0.7–4.0)
LYMPHS PCT: 31 %
MCH: 31.2 pg (ref 26.0–34.0)
MCHC: 33.9 g/dL (ref 30.0–36.0)
MCV: 91.9 fL (ref 78.0–100.0)
MONO ABS: 0.5 10*3/uL (ref 0.1–1.0)
MONOS PCT: 8 %
Neutro Abs: 3.7 10*3/uL (ref 1.7–7.7)
Neutrophils Relative %: 57 %
PLATELETS: 197 10*3/uL (ref 150–400)
RBC: 4.71 MIL/uL (ref 4.22–5.81)
RDW: 12.6 % (ref 11.5–15.5)
WBC: 6.3 10*3/uL (ref 4.0–10.5)

## 2018-07-25 LAB — APTT: aPTT: 52 seconds — ABNORMAL HIGH (ref 24–36)

## 2018-07-25 LAB — URINALYSIS, ROUTINE W REFLEX MICROSCOPIC
Bilirubin Urine: NEGATIVE
GLUCOSE, UA: NEGATIVE mg/dL
Hgb urine dipstick: NEGATIVE
KETONES UR: NEGATIVE mg/dL
LEUKOCYTES UA: NEGATIVE
Nitrite: NEGATIVE
PROTEIN: NEGATIVE mg/dL
Specific Gravity, Urine: 1.028 (ref 1.005–1.030)
pH: 5 (ref 5.0–8.0)

## 2018-07-25 LAB — PROTIME-INR
INR: 3.2
Prothrombin Time: 32.5 seconds — ABNORMAL HIGH (ref 11.4–15.2)

## 2018-07-25 LAB — SURGICAL PCR SCREEN
MRSA, PCR: NEGATIVE
STAPHYLOCOCCUS AUREUS: NEGATIVE

## 2018-07-25 LAB — ABO/RH: ABO/RH(D): O POS

## 2018-07-25 NOTE — Progress Notes (Signed)
UA, PT, PTT ROUTED TO DR GRAVES

## 2018-07-25 NOTE — Progress Notes (Signed)
Surgical clearance Dr Ed Blalock on chart

## 2018-07-29 ENCOUNTER — Telehealth: Payer: Self-pay | Admitting: Family Medicine

## 2018-07-29 DIAGNOSIS — I10 Essential (primary) hypertension: Secondary | ICD-10-CM

## 2018-07-29 MED ORDER — FUROSEMIDE 40 MG PO TABS: 40.0000 mg | ORAL_TABLET | Freq: Every day | ORAL | 1 refills | Status: DC | PRN

## 2018-07-29 MED ORDER — DOXAZOSIN MESYLATE 4 MG PO TABS: 4.0000 mg | ORAL_TABLET | Freq: Every day | ORAL | 1 refills | Status: DC

## 2018-07-29 MED ORDER — LISINOPRIL 10 MG PO TABS: 10.0000 mg | ORAL_TABLET | Freq: Every day | ORAL | 1 refills | Status: DC

## 2018-07-29 MED ORDER — POTASSIUM CHLORIDE CRYS ER 10 MEQ PO TBCR: 10.0000 meq | EXTENDED_RELEASE_TABLET | Freq: Every day | ORAL | 1 refills | Status: DC

## 2018-07-29 NOTE — Telephone Encounter (Signed)
Jerome Hicks called this morning stating that he needed refills on a few of his meds. They are potassium,Doxazofin, and Lisinopril. These are through the [facility] mail delivery. He asked if you could check to see if he was in need of refills on other meds.

## 2018-07-31 MED ORDER — TRANEXAMIC ACID 1000 MG/10ML IV SOLN
2000.0000 mg | INTRAVENOUS | Status: DC
  Filled 2018-07-31: qty 20

## 2018-07-31 MED ORDER — BUPIVACAINE LIPOSOME 1.3 % IJ SUSP
20.0000 mL | INTRAMUSCULAR | Status: DC
  Filled 2018-07-31: qty 20

## 2018-07-31 NOTE — H&P (Addendum)
TOTAL KNEE ADMISSION H&P  Patient is being admitted for right total knee arthroplasty.  Subjective:  Chief Complaint:right knee pain.  HPI: Jerome Hicks, 67 y.o. male, has a history of pain and functional disability in the right knee due to arthritis and has failed non-surgical conservative treatments for greater than 12 weeks to includeNSAID's and/or analgesics, corticosteriod injections, viscosupplementation injections, flexibility and strengthening excercises, weight reduction as appropriate and activity modification.  Onset of symptoms was gradual, starting 5 years ago with gradually worsening course since that time. The patient noted no past surgery on the right knee(s).  Patient currently rates pain in the right knee(s) at 9 out of 10 with activity. Patient has night pain, worsening of pain with activity and weight bearing, pain that interferes with activities of daily living, pain with passive range of motion and joint swelling.  Patient has evidence of subchondral sclerosis, periarticular osteophytes, joint subluxation and joint space narrowing by imaging studies. This patient has had failure of all reasonable conservative care.. There is no active infection.  Patient Active Problem List   Diagnosis Date Noted  . 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 Primary osteoarthritis of right knee 10/21/2017  . BMI 37.0-37.9, adult 04/27/2016  . Glenohumeral arthritis of both shoulders 04/14/2015  . 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 Hypogonadism male 02/26/2014  . Lower leg edema 12/02/2013  . Lymph node enlargement 12/02/2013  . CKD (chronic kidney disease) stage 3, GFR 30-59 ml/min (HCC) 05/28/2013  . Atrial fibrillation ([place]) 06/25/2012  . 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 RESTRICTIVE LUNG DISEASE 01/12/2011  . ERECTILE DYSFUNCTION 01/06/2008  . ESSENTIAL HYPERTENSION, BENIGN 01/05/2008  . Venous (peripheral) insufficiency 01/05/2008   Past Medical History:  Diagnosis Date  .  Arthritis    oa  . 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 Ascending aortic aneurysm ([place])    seen on 01-31-18 ct chest ; patient denies awareness  . Atrial fibrillation ([place])   . Cataracts, bilateral   . ED (erectile dysfunction)    has been prescribed Levitra and Viagrain in the past  . Hypertension   . Internal hemorrhoids   . Kidney stones   . Leg edema    recurrent cellulitis  . Scrotal abscess 3-03  . Shoulder arthritis   . Sigmoid diverticulosis     Past Surgical History:  Procedure Laterality Date  .  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 CHOLECYSTECTOMY  1971  . kidney stones  1990's   Ureteroscopic stone extraction  . TONSILLECTOMY      Current Facility-Administered Medications  Medication Dose Route Frequency Provider Last Rate Last Dose  . [START ON 08/01/2018] bupivacaine liposome (EXPAREL) 1.3 % injection 266 mg  20 mL Infiltration On Call to OR Dorna Leitz, MD       Current Outpatient Medications  Medication Sig Dispense Refill Last Dose  . Carboxymethylcellul-Glycerin (LUBRICATING EYE DROPS OP) Place 1 drop into both eyes daily as needed (dry eyes).     . carvedilol (COREG) 3.125 MG tablet Take 1 tablet (3.125 mg total) by mouth 2 (two) times daily with a meal. 180 tablet 3 Taking  . clotrimazole-betamethasone (LOTRISONE) cream Apply 1 application topically daily as needed (rash).    Taking  . meclizine (ANTIVERT) 25 MG tablet TAKE 1 TABLET THREE TIMES DAILY AS NEEDED  FOR  DIZZINESS  OR  NAUSEA  AS DIRECTED (Patient taking differently: Take 25 mg by mouth 3 (three) times daily as needed  for dizziness or nausea. ) 90 tablet 1 Taking  . Multiple Vitamin (MULTIVITAMIN WITH MINERALS) TABS tablet Take 1 tablet by mouth daily.     . traMADol (ULTRAM) 50 MG tablet Take 1-2 tablets (50-100 mg total) by mouth 2 (two) times daily as needed. (Patient taking differently: Take 50 mg by mouth 3 (three) times daily as needed for severe pain. ) 360  tablet 1 Taking  . vitamin B-12 (CYANOCOBALAMIN) 1000 MCG tablet Take 1,000 mcg by mouth daily.     Marland Kitchen warfarin (COUMADIN) 5 MG tablet Take 1 tablet (5 mg total) by mouth daily. (Patient taking differently: Take 2.5-5 mg by mouth See admin instructions. Take 5 mg daily on Mon, Wed, Thurs, and Sat  Take 2.5 mg daily on Tues, Fri, and Sun) 90 tablet 3 Taking  . doxazosin (CARDURA) 4 MG tablet Take 1 tablet (4 mg total) by mouth daily. 90 tablet 1   . furosemide (LASIX) 40 MG tablet Take 1-2 tablets (40-80 mg total) by mouth daily as needed for fluid or edema. 180 tablet 1   . lisinopril (PRINIVIL,ZESTRIL) 10 MG tablet Take 1 tablet (10 mg total) by mouth daily. 90 tablet 1   . potassium chloride (K-DUR,KLOR-CON) 10 MEQ tablet Take 1 tablet (10 mEq total) by mouth daily. 90 tablet 1    Allergies  Allergen Reactions  . Penicillins     Childhood allergy Has patient had a PCN reaction causing immediate rash, facial/tongue/throat swelling, SOB or lightheadedness with hypotension: Unknown Has patient had a PCN reaction causing severe rash involving mucus membranes or skin necrosis: Unknown Has patient had a PCN reaction that required hospitalization: Unknown Has patient had a PCN reaction occurring within the last 10 years: No If all of the above answers are "NO", then may proceed with Cephalosporin use.     Social History   Tobacco Use  . Smoking status: Never Smoker  . Smokeless tobacco: Current User    Types: Chew  Substance Use Topics  . Alcohol use: No    Family History  Problem Relation Age of Onset  . Heart attack Mother 48  . Heart attack Father 72  . Heart attack Sister 27  . Heart attack Brother 34  . Multiple sclerosis Daughter      ROS ROS: I have reviewed the patient's review of systems thoroughly and there are no positive responses as relates to the HPI. Objective:  Physical Exam  Vital signs in last 24 hours:    Vitals:   08/01/18 0844  BP: 130/89  Pulse: 78   Resp: 20  Temp: 97.9 F (36.6 C)  SpO2: 99%   Well-developed well-nourished patient in no acute distress. Alert and oriented x3 HEENT:within normal limits Cardiac: Regular rate and rhythm Pulmonary: Lungs clear to auscultation Abdomen: Soft and nontender.  Normal active bowel sounds  Musculoskeletal: (R knee: painful rom limited rom nvi distally Labs: Recent Results (from the past 2160 hour(s))  POCT INR     Status: None   Collection Time: 05/19/18  8:25 AM  Result Value Ref Range   INR 2.5 2.0 - 3.0  POCT INR     Status: None   Collection Time: 06/16/18  8:59 AM  Result Value Ref Range   INR 2.5 2.0 - 3.0  POCT INR     Status: None   Collection Time: 07/17/18  8:27 AM  Result Value Ref Range   INR 3.0 2.0 - 3.0  APTT     Status: Abnormal  Collection Time: 07/25/18 10:52 AM  Result Value Ref Range   aPTT 52 (H) 24 - 36 seconds    Comment:        IF BASELINE aPTT IS ELEVATED, SUGGEST PATIENT RISK ASSESSMENT BE USED TO DETERMINE APPROPRIATE ANTICOAGULANT THERAPY. Performed at [facility], [place] [address], [place], [place] [address]   Basic metabolic panel     Status: Abnormal   Collection Time: 07/25/18 10:52 AM  Result Value Ref Range   Sodium 143 135 - 145 mmol/L   Potassium 4.5 3.5 - 5.1 mmol/L   Chloride 106 98 - 111 mmol/L   CO2 30 22 - 32 mmol/L   Glucose, Bld 111 (H) 70 - 99 mg/dL   BUN 18 8 - 23 mg/dL   Creatinine, Ser 0.88 0.61 - 1.24 mg/dL   Calcium 9.4 8.9 - 10.3 mg/dL   GFR calc non Af Amer >60 >60 mL/min   GFR calc Af Amer >60 >60 mL/min    Comment: (NOTE) The eGFR has been calculated using the CKD EPI equation. This calculation has not been validated in all clinical situations. eGFR's persistently <60 mL/min signify possible Chronic Kidney Disease.    Anion gap 7 5 - 15    Comment: Performed at [facility], [place] [address] [place], [place] [address]  CBC WITH DIFFERENTIAL     Status: None   Collection  Time: 07/25/18 10:52 AM  Result Value Ref Range   WBC 6.3 4.0 - 10.5 K/uL   RBC 4.71 4.22 - 5.81 MIL/uL   Hemoglobin 14.7 13.0 - 17.0 g/dL   HCT 43.3 39.0 - 52.0 %   MCV 91.9 78.0 - 100.0 fL   MCH 31.2 26.0 - 34.0 pg   MCHC 33.9 30.0 - 36.0 g/dL   RDW 12.6 11.5 - 15.5 %   Platelets 197 150 - 400 K/uL   Neutrophils Relative % 57 %   Neutro Abs 3.7 1.7 - 7.7 K/uL   Lymphocytes Relative 31 %   Lymphs Abs 2.0 0.7 - 4.0 K/uL   Monocytes Relative 8 %   Monocytes Absolute 0.5 0.1 - 1.0 K/uL   Eosinophils Relative 3 %   Eosinophils Absolute 0.2 0.0 - 0.7 K/uL   Basophils Relative 1 %   Basophils Absolute 0.0 0.0 - 0.1 K/uL    Comment: Performed at [facility], [place] [address], [place], [place] [address]  Protime-INR     Status: Abnormal   Collection Time: 07/25/18 10:52 AM  Result Value Ref Range   Prothrombin Time 32.5 (H) 11.4 - 15.2 seconds   INR 3.20     Comment: Performed at [facility], [place] [address] [place], [place] [address]  Type and screen Order type and screen if day of surgery is less than 15 days from draw of preadmission visit or order morning of surgery if day of surgery is greater than 6 days from preadmission visit.     Status: None   Collection Time: 07/25/18 10:52 AM  Result Value Ref Range   ABO/RH(D) O POS    Antibody Screen NEG    Sample Expiration 08/08/2018    Extend sample reason      NO TRANSFUSIONS OR PREGNANCY IN THE PAST 3 MONTHS Performed at [facility], [place] [address], [place], [place] [address]   Surgical pcr screen     Status: None   Collection Time: 07/25/18 10:52 AM  Result Value Ref Range  MRSA, PCR NEGATIVE NEGATIVE   Staphylococcus aureus NEGATIVE NEGATIVE    Comment: (NOTE) The Xpert SA Assay (FDA approved for NASAL specimens in patients 54 years of age and older), is one component of a comprehensive surveillance program. It is not intended to diagnose infection nor to guide or  monitor treatment. Performed at [facility], [place] [address] [place], [place] [address]   Urinalysis, Routine w reflex microscopic     Status: Abnormal   Collection Time: 07/25/18 10:52 AM  Result Value Ref Range   Color, Urine AMBER (A) YELLOW    Comment: BIOCHEMICALS MAY BE AFFECTED BY COLOR   APPearance HAZY (A) CLEAR   Specific Gravity, Urine 1.028 1.005 - 1.030   pH 5.0 5.0 - 8.0   Glucose, UA NEGATIVE NEGATIVE mg/dL   Hgb urine dipstick NEGATIVE NEGATIVE   Bilirubin Urine NEGATIVE NEGATIVE   Ketones, ur NEGATIVE NEGATIVE mg/dL   Protein, ur NEGATIVE NEGATIVE mg/dL   Nitrite NEGATIVE NEGATIVE   Leukocytes, UA NEGATIVE NEGATIVE    Comment: Performed at [facility], [place] [address] [place], [place] [address]  ABO/Rh     Status: None   Collection Time: 07/25/18 10:52 AM  Result Value Ref Range   ABO/RH(D)      O POS Performed at [facility], [place] [address] [place], [place] [address]     Estimated body mass index is 36.34 kg/m as calculated from the following:   Height as of 07/25/18: 6' 2"  (1.88 m).   Weight as of 07/25/18: 128.4 kg.   Imaging Review Plain radiographs demonstrate severe degenerative joint disease of the right knee(s). The overall alignment ismild varus. The bone quality appears to be good for age and reported activity level.   Preoperative templating of the joint replacement has been completed, documented, and submitted to the Operating Room personnel in order to optimize intra-operative equipment management.   Anticipated LOS equal to or greater than 2 midnights due to - Age 1 and older with one or more of the following:  - Obesity  - Expected need for hospital services (PT, OT, Nursing) required for safe  discharge  - Anticipated need for postoperative skilled nursing care or inpatient rehab  - Active co-morbidities: Coronary Artery Disease OR   - Unanticipated findings during/Post  Surgery: None  - Patient is a high risk of re-admission due to: Barriers to post-acute care (logistical, no family support in home)     Assessment/Plan:  End stage arthritis, right knee   The patient history, physical examination, clinical judgment of the provider and imaging studies are consistent with end stage degenerative joint disease of the right knee(s) and total knee arthroplasty is deemed medically necessary. The treatment options including medical management, injection therapy arthroscopy and arthroplasty were discussed at length. The risks and benefits of total knee arthroplasty were presented and reviewed. The risks due to aseptic loosening, infection, stiffness, patella tracking problems, thromboembolic complications and other imponderables were discussed. The patient acknowledged the explanation, agreed to proceed with the plan and consent was signed. Patient is being admitted for inpatient treatment for surgery, pain control, PT, OT, prophylactic antibiotics, VTE prophylaxis, progressive ambulation and ADL's and discharge planning. The patient is planning to be discharged home with home health services

## 2018-08-01 ENCOUNTER — Encounter (HOSPITAL_COMMUNITY)
Admission: RE | Disposition: A | Discharge status: Home / Self Care | Payer: Self-pay | Source: Ambulatory Visit | Attending: Orthopedic Surgery

## 2018-08-01 ENCOUNTER — Ambulatory Visit (HOSPITAL_COMMUNITY): Payer: Medicare HMO | Admitting: Anesthesiology

## 2018-08-01 ENCOUNTER — Encounter (HOSPITAL_COMMUNITY): Payer: Self-pay | Admitting: *Deleted

## 2018-08-01 ENCOUNTER — Other Ambulatory Visit: Payer: Self-pay

## 2018-08-01 ENCOUNTER — Observation Stay (HOSPITAL_COMMUNITY)
Admission: RE | Admit: 2018-08-01 | Discharge: 2018-08-02 | Disposition: A | Discharge status: Home / Self Care | Payer: Medicare HMO | Source: Ambulatory Visit | Attending: Orthopedic Surgery | Admitting: Orthopedic Surgery

## 2018-08-01 DIAGNOSIS — G8918 Other acute postprocedural pain: Secondary | ICD-10-CM | POA: Diagnosis not present

## 2018-08-01 DIAGNOSIS — M1711 Unilateral primary osteoarthritis, right knee: Principal | ICD-10-CM | POA: Insufficient documentation

## 2018-08-01 DIAGNOSIS — I712 Thoracic aortic aneurysm, without rupture: Secondary | ICD-10-CM | POA: Diagnosis not present

## 2018-08-01 DIAGNOSIS — Z87442 Personal history of urinary calculi: Secondary | ICD-10-CM | POA: Diagnosis not present

## 2018-08-01 DIAGNOSIS — Z96651 Presence of right artificial knee joint: Secondary | ICD-10-CM | POA: Diagnosis present

## 2018-08-01 DIAGNOSIS — R262 Difficulty in walking, not elsewhere classified: Secondary | ICD-10-CM | POA: Insufficient documentation

## 2018-08-01 DIAGNOSIS — N183 Chronic kidney disease, stage 3 (moderate): Secondary | ICD-10-CM | POA: Diagnosis not present

## 2018-08-01 DIAGNOSIS — I4891 Unspecified atrial fibrillation: Secondary | ICD-10-CM | POA: Insufficient documentation

## 2018-08-01 DIAGNOSIS — I129 Hypertensive chronic kidney disease with stage 1 through stage 4 chronic kidney disease, or unspecified chronic kidney disease: Secondary | ICD-10-CM | POA: Diagnosis not present

## 2018-08-01 DIAGNOSIS — Z79899 Other long term (current) drug therapy: Secondary | ICD-10-CM | POA: Insufficient documentation

## 2018-08-01 DIAGNOSIS — Z96659 Presence of unspecified artificial knee joint: Secondary | ICD-10-CM

## 2018-08-01 DIAGNOSIS — Z880 Allergy status to penicillin: Secondary | ICD-10-CM | POA: Diagnosis not present

## 2018-08-01 DIAGNOSIS — I10 Essential (primary) hypertension: Secondary | ICD-10-CM | POA: Diagnosis not present

## 2018-08-01 HISTORY — PX: TOTAL KNEE ARTHROPLASTY: SHX125

## 2018-08-01 LAB — TYPE AND SCREEN
ABO/RH(D): O POS
ANTIBODY SCREEN: NEGATIVE

## 2018-08-01 LAB — PROTIME-INR
INR: 1.24
PROTHROMBIN TIME: 15.5 s — AB (ref 11.4–15.2)

## 2018-08-01 SURGERY — ARTHROPLASTY, KNEE, TOTAL
Anesthesia: Spinal | Site: Knee | Laterality: Right

## 2018-08-01 MED ORDER — TRANEXAMIC ACID 1000 MG/10ML IV SOLN
1000.0000 mg | INTRAVENOUS | Status: AC
  Administered 2018-08-01: 1000 mg via INTRAVENOUS
  Filled 2018-08-01: qty 10

## 2018-08-01 MED ORDER — ONDANSETRON HCL 4 MG/2ML IJ SOLN
INTRAMUSCULAR | Status: AC
  Filled 2018-08-01: qty 2

## 2018-08-01 MED ORDER — SODIUM CHLORIDE 0.9 % IJ SOLN
INTRAMUSCULAR | Status: DC | PRN
  Administered 2018-08-01: 30 mL

## 2018-08-01 MED ORDER — SODIUM CHLORIDE 0.9 % IV SOLN
INTRAVENOUS | Status: DC
  Administered 2018-08-01: 16:00:00 via INTRAVENOUS

## 2018-08-01 MED ORDER — FENTANYL CITRATE (PF) 100 MCG/2ML IJ SOLN
INTRAMUSCULAR | Status: AC
  Filled 2018-08-01: qty 2

## 2018-08-01 MED ORDER — PROPOFOL 10 MG/ML IV BOLUS
INTRAVENOUS | Status: AC
  Filled 2018-08-01: qty 60

## 2018-08-01 MED ORDER — CARVEDILOL 3.125 MG PO TABS
3.1250 mg | ORAL_TABLET | Freq: Two times a day (BID) | ORAL | Status: DC
  Administered 2018-08-01 – 2018-08-02 (×2): 3.125 mg via ORAL
  Filled 2018-08-01 (×2): qty 1

## 2018-08-01 MED ORDER — CLINDAMYCIN PHOSPHATE 900 MG/50ML IV SOLN
900.0000 mg | INTRAVENOUS | Status: AC
  Administered 2018-08-01: 900 mg via INTRAVENOUS
  Filled 2018-08-01: qty 50

## 2018-08-01 MED ORDER — GABAPENTIN 300 MG PO CAPS
300.0000 mg | ORAL_CAPSULE | Freq: Two times a day (BID) | ORAL | Status: DC
  Administered 2018-08-01 – 2018-08-02 (×2): 300 mg via ORAL
  Filled 2018-08-01 (×2): qty 1

## 2018-08-01 MED ORDER — MAGNESIUM CITRATE PO SOLN: 1.0000 | Freq: Once | ORAL | Status: DC | PRN

## 2018-08-01 MED ORDER — BUPIVACAINE-EPINEPHRINE (PF) 0.25% -1:200000 IJ SOLN
INTRAMUSCULAR | Status: AC
  Filled 2018-08-01: qty 30

## 2018-08-01 MED ORDER — BUPIVACAINE IN DEXTROSE 0.75-8.25 % IT SOLN
INTRATHECAL | Status: DC | PRN
  Administered 2018-08-01: 2 mL via INTRATHECAL

## 2018-08-01 MED ORDER — SODIUM CHLORIDE 0.9 % IV SOLN
INTRAVENOUS | Status: DC | PRN
  Administered 2018-08-01: 50 ug/min via INTRAVENOUS

## 2018-08-01 MED ORDER — ALBUTEROL SULFATE (2.5 MG/3ML) 0.083% IN NEBU
2.5000 mg | INHALATION_SOLUTION | Freq: Four times a day (QID) | RESPIRATORY_TRACT | Status: DC | PRN
  Administered 2018-08-01: 2.5 mg via RESPIRATORY_TRACT

## 2018-08-01 MED ORDER — METHOCARBAMOL 500 MG PO TABS
500.0000 mg | ORAL_TABLET | Freq: Four times a day (QID) | ORAL | Status: DC | PRN
  Administered 2018-08-02: 500 mg via ORAL
  Filled 2018-08-01: qty 1

## 2018-08-01 MED ORDER — ALBUMIN HUMAN 5 % IV SOLN
INTRAVENOUS | Status: AC
  Filled 2018-08-01: qty 250

## 2018-08-01 MED ORDER — BUPIVACAINE LIPOSOME 1.3 % IJ SUSP
INTRAMUSCULAR | Status: DC | PRN
  Administered 2018-08-01: 20 mL

## 2018-08-01 MED ORDER — MIDAZOLAM HCL 2 MG/2ML IJ SOLN
1.0000 mg | Freq: Once | INTRAMUSCULAR | Status: AC
  Administered 2018-08-01: 2 mg via INTRAVENOUS

## 2018-08-01 MED ORDER — ALBUMIN HUMAN 5 % IV SOLN
INTRAVENOUS | Status: DC | PRN
  Administered 2018-08-01 (×2): via INTRAVENOUS

## 2018-08-01 MED ORDER — PHENYLEPHRINE HCL 10 MG/ML IJ SOLN
INTRAMUSCULAR | Status: AC
  Filled 2018-08-01: qty 1

## 2018-08-01 MED ORDER — ALUM & MAG HYDROXIDE-SIMETH 200-200-20 MG/5ML PO SUSP: 30.0000 mL | ORAL | Status: DC | PRN

## 2018-08-01 MED ORDER — PROMETHAZINE HCL 25 MG/ML IJ SOLN: 6.2500 mg | INTRAMUSCULAR | Status: DC | PRN

## 2018-08-01 MED ORDER — PROPOFOL 10 MG/ML IV BOLUS
INTRAVENOUS | Status: DC | PRN
  Administered 2018-08-01: 70 mg via INTRAVENOUS

## 2018-08-01 MED ORDER — ACETAMINOPHEN 325 MG PO TABS: 325.0000 mg | ORAL_TABLET | Freq: Four times a day (QID) | ORAL | Status: DC | PRN

## 2018-08-01 MED ORDER — SODIUM CHLORIDE 0.9 % IR SOLN
Status: DC | PRN
  Administered 2018-08-01: 1000 mL

## 2018-08-01 MED ORDER — POLYETHYLENE GLYCOL 3350 17 G PO PACK: 17.0000 g | PACK | Freq: Every day | ORAL | Status: DC | PRN

## 2018-08-01 MED ORDER — ONDANSETRON HCL 4 MG/2ML IJ SOLN
INTRAMUSCULAR | Status: DC | PRN
  Administered 2018-08-01: 4 mg via INTRAVENOUS

## 2018-08-01 MED ORDER — MIDAZOLAM HCL 2 MG/2ML IJ SOLN
INTRAMUSCULAR | Status: AC
  Filled 2018-08-01: qty 2

## 2018-08-01 MED ORDER — HYDROMORPHONE HCL 1 MG/ML IJ SOLN: 0.5000 mg | INTRAMUSCULAR | Status: DC | PRN

## 2018-08-01 MED ORDER — PROPOFOL 500 MG/50ML IV EMUL
INTRAVENOUS | Status: DC | PRN
  Administered 2018-08-01: 140 ug/kg/min via INTRAVENOUS

## 2018-08-01 MED ORDER — PHENYLEPHRINE 40 MCG/ML (10ML) SYRINGE FOR IV PUSH (FOR BLOOD PRESSURE SUPPORT)
PREFILLED_SYRINGE | INTRAVENOUS | Status: DC | PRN
  Administered 2018-08-01 (×2): 120 ug via INTRAVENOUS

## 2018-08-01 MED ORDER — METHOCARBAMOL 500 MG IVPB - SIMPLE MED
500.0000 mg | Freq: Four times a day (QID) | INTRAVENOUS | Status: DC | PRN
  Filled 2018-08-01: qty 50

## 2018-08-01 MED ORDER — TIZANIDINE HCL 2 MG PO TABS: 2.0000 mg | ORAL_TABLET | Freq: Three times a day (TID) | ORAL | 0 refills | Status: DC | PRN

## 2018-08-01 MED ORDER — STERILE WATER FOR IRRIGATION IR SOLN
Status: DC | PRN
  Administered 2018-08-01: 2000 mL

## 2018-08-01 MED ORDER — BUPIVACAINE-EPINEPHRINE 0.25% -1:200000 IJ SOLN
INTRAMUSCULAR | Status: DC | PRN
  Administered 2018-08-01: 30 mL

## 2018-08-01 MED ORDER — DEXAMETHASONE SODIUM PHOSPHATE 10 MG/ML IJ SOLN
INTRAMUSCULAR | Status: AC
  Filled 2018-08-01: qty 1

## 2018-08-01 MED ORDER — SODIUM CHLORIDE 0.9 % IJ SOLN
INTRAMUSCULAR | Status: AC
  Filled 2018-08-01: qty 50

## 2018-08-01 MED ORDER — CELECOXIB 200 MG PO CAPS
200.0000 mg | ORAL_CAPSULE | Freq: Two times a day (BID) | ORAL | Status: DC
  Administered 2018-08-01 – 2018-08-02 (×2): 200 mg via ORAL
  Filled 2018-08-01 (×2): qty 1

## 2018-08-01 MED ORDER — LISINOPRIL 10 MG PO TABS
10.0000 mg | ORAL_TABLET | Freq: Every day | ORAL | Status: DC
  Administered 2018-08-02: 10 mg via ORAL
  Filled 2018-08-01: qty 1

## 2018-08-01 MED ORDER — TRANEXAMIC ACID 1000 MG/10ML IV SOLN
1000.0000 mg | Freq: Once | INTRAVENOUS | Status: AC
  Administered 2018-08-01: 1000 mg via INTRAVENOUS
  Filled 2018-08-01: qty 1000

## 2018-08-01 MED ORDER — LACTATED RINGERS IV SOLN: INTRAVENOUS | Status: DC

## 2018-08-01 MED ORDER — ONDANSETRON HCL 4 MG/2ML IJ SOLN: 4.0000 mg | Freq: Four times a day (QID) | INTRAMUSCULAR | Status: DC | PRN

## 2018-08-01 MED ORDER — DOXAZOSIN MESYLATE 4 MG PO TABS
4.0000 mg | ORAL_TABLET | Freq: Every day | ORAL | Status: DC
  Administered 2018-08-02: 4 mg via ORAL
  Filled 2018-08-01: qty 1

## 2018-08-01 MED ORDER — OXYCODONE-ACETAMINOPHEN 5-325 MG PO TABS: 1.0000 | ORAL_TABLET | Freq: Four times a day (QID) | ORAL | 0 refills | Status: DC | PRN

## 2018-08-01 MED ORDER — ROPIVACAINE HCL 5 MG/ML IJ SOLN
INTRAMUSCULAR | Status: DC | PRN
  Administered 2018-08-01: 30 mg via PERINEURAL

## 2018-08-01 MED ORDER — DIPHENHYDRAMINE HCL 12.5 MG/5ML PO ELIX: 12.5000 mg | ORAL_SOLUTION | ORAL | Status: DC | PRN

## 2018-08-01 MED ORDER — ALBUMIN HUMAN 5 % IV SOLN
INTRAVENOUS | Status: AC
  Filled 2018-08-01: qty 500

## 2018-08-01 MED ORDER — OXYCODONE HCL 5 MG PO TABS
5.0000 mg | ORAL_TABLET | ORAL | Status: DC | PRN
  Administered 2018-08-01: 10 mg via ORAL
  Administered 2018-08-01: 5 mg via ORAL
  Administered 2018-08-02 (×2): 10 mg via ORAL
  Filled 2018-08-01 (×2): qty 2
  Filled 2018-08-01: qty 1
  Filled 2018-08-01: qty 2

## 2018-08-01 MED ORDER — ALBUTEROL SULFATE (2.5 MG/3ML) 0.083% IN NEBU
INHALATION_SOLUTION | RESPIRATORY_TRACT | Status: AC
  Filled 2018-08-01: qty 3

## 2018-08-01 MED ORDER — DEXAMETHASONE SODIUM PHOSPHATE 10 MG/ML IJ SOLN
10.0000 mg | Freq: Two times a day (BID) | INTRAMUSCULAR | Status: DC
  Administered 2018-08-01 – 2018-08-02 (×2): 10 mg via INTRAVENOUS
  Filled 2018-08-01 (×2): qty 1

## 2018-08-01 MED ORDER — DOCUSATE SODIUM 100 MG PO CAPS
100.0000 mg | ORAL_CAPSULE | Freq: Two times a day (BID) | ORAL | Status: DC
  Administered 2018-08-01 – 2018-08-02 (×2): 100 mg via ORAL
  Filled 2018-08-01 (×2): qty 1

## 2018-08-01 MED ORDER — DEXAMETHASONE SODIUM PHOSPHATE 10 MG/ML IJ SOLN
INTRAMUSCULAR | Status: DC | PRN
  Administered 2018-08-01: 10 mg via INTRAVENOUS

## 2018-08-01 MED ORDER — CHLORHEXIDINE GLUCONATE 4 % EX LIQD: 60.0000 mL | Freq: Once | CUTANEOUS | Status: DC

## 2018-08-01 MED ORDER — WARFARIN SODIUM 5 MG PO TABS
5.0000 mg | ORAL_TABLET | Freq: Once | ORAL | Status: AC
  Administered 2018-08-01: 5 mg via ORAL
  Filled 2018-08-01: qty 1

## 2018-08-01 MED ORDER — PROPOFOL 10 MG/ML IV BOLUS
INTRAVENOUS | Status: AC
  Filled 2018-08-01: qty 40

## 2018-08-01 MED ORDER — BISACODYL 5 MG PO TBEC: 5.0000 mg | DELAYED_RELEASE_TABLET | Freq: Every day | ORAL | Status: DC | PRN

## 2018-08-01 MED ORDER — POTASSIUM CHLORIDE CRYS ER 10 MEQ PO TBCR
10.0000 meq | EXTENDED_RELEASE_TABLET | Freq: Every day | ORAL | Status: DC
  Administered 2018-08-01 – 2018-08-02 (×2): 10 meq via ORAL
  Filled 2018-08-01 (×2): qty 1

## 2018-08-01 MED ORDER — FENTANYL CITRATE (PF) 100 MCG/2ML IJ SOLN
50.0000 ug | Freq: Once | INTRAMUSCULAR | Status: AC
  Administered 2018-08-01: 100 ug via INTRAVENOUS

## 2018-08-01 MED ORDER — PHENYLEPHRINE 40 MCG/ML (10ML) SYRINGE FOR IV PUSH (FOR BLOOD PRESSURE SUPPORT)
PREFILLED_SYRINGE | INTRAVENOUS | Status: AC
  Filled 2018-08-01: qty 10

## 2018-08-01 MED ORDER — LACTATED RINGERS IV SOLN
INTRAVENOUS | Status: DC
  Administered 2018-08-01 (×2): via INTRAVENOUS

## 2018-08-01 MED ORDER — FENTANYL CITRATE (PF) 100 MCG/2ML IJ SOLN: 25.0000 ug | INTRAMUSCULAR | Status: DC | PRN

## 2018-08-01 MED ORDER — ONDANSETRON HCL 4 MG PO TABS: 4.0000 mg | ORAL_TABLET | Freq: Four times a day (QID) | ORAL | Status: DC | PRN

## 2018-08-01 MED ORDER — CLINDAMYCIN PHOSPHATE 600 MG/50ML IV SOLN
600.0000 mg | Freq: Four times a day (QID) | INTRAVENOUS | Status: AC
  Administered 2018-08-01 (×2): 600 mg via INTRAVENOUS
  Filled 2018-08-01 (×2): qty 50

## 2018-08-01 MED ORDER — 0.9 % SODIUM CHLORIDE (POUR BTL) OPTIME
TOPICAL | Status: DC | PRN
  Administered 2018-08-01: 1000 mL

## 2018-08-01 MED ORDER — WARFARIN - PHARMACIST DOSING INPATIENT
Freq: Every day | Status: DC
  Administered 2018-08-01: 18:00:00

## 2018-08-01 MED ORDER — DOCUSATE SODIUM 100 MG PO CAPS: 100.0000 mg | ORAL_CAPSULE | Freq: Two times a day (BID) | ORAL | 0 refills | Status: DC

## 2018-08-01 SURGICAL SUPPLY — 59 items
ATTUNE MED DOME PAT 41 KNEE (Knees) ×2 IMPLANT
ATTUNE MED DOME PAT 41MM KNEE (Knees) ×1 IMPLANT
ATTUNE PS FEM RT SZ 6 CEM KNEE (Femur) ×3 IMPLANT
ATTUNE PSRP INSR SZ6 5 KNEE (Insert) ×2 IMPLANT
ATTUNE PSRP INSR SZ6 5MM KNEE (Insert) ×1 IMPLANT
BAG ZIPLOCK 12X15 (MISCELLANEOUS) ×3 IMPLANT
BANDAGE ACE 6X5 VEL STRL LF (GAUZE/BANDAGES/DRESSINGS) ×3 IMPLANT
BASE TIBIAL ROT PLAT SZ 8 KNEE (Knees) ×1 IMPLANT
BENZOIN TINCTURE PRP APPL 2/3 (GAUZE/BANDAGES/DRESSINGS) ×3 IMPLANT
BLADE SAGITTAL 25.0X1.19X90 (BLADE) ×2 IMPLANT
BLADE SAGITTAL 25.0X1.19X90MM (BLADE) ×1
BLADE SAW SGTL 11.0X1.19X90.0M (BLADE) ×3 IMPLANT
BOOTIES KNEE HIGH SLOAN (MISCELLANEOUS) ×3 IMPLANT
BOWL SMART MIX CTS (DISPOSABLE) ×3 IMPLANT
CEMENT HV SMART SET (Cement) ×3 IMPLANT
CLOSURE WOUND 1/2 X4 (GAUZE/BANDAGES/DRESSINGS) ×3
CUFF TOURN SGL QUICK 34 (TOURNIQUET CUFF) ×2
CUFF TRNQT CYL 34X4X40X1 (TOURNIQUET CUFF) ×1 IMPLANT
DECANTER SPIKE VIAL GLASS SM (MISCELLANEOUS) ×3 IMPLANT
DRAPE U-SHAPE 47X51 STRL (DRAPES) ×3 IMPLANT
DRSG AQUACEL AG ADV 3.5X10 (GAUZE/BANDAGES/DRESSINGS) ×3 IMPLANT
DURAPREP 26ML APPLICATOR (WOUND CARE) ×3 IMPLANT
ELECT REM PT RETURN 15FT ADLT (MISCELLANEOUS) ×3 IMPLANT
GLOVE BIOGEL PI IND STRL 8 (GLOVE) ×2 IMPLANT
GLOVE BIOGEL PI INDICATOR 8 (GLOVE) ×4
GLOVE ECLIPSE 7.5 STRL STRAW (GLOVE) ×6 IMPLANT
GOWN STRL REUS W/TWL XL LVL3 (GOWN DISPOSABLE) ×6 IMPLANT
HANDPIECE INTERPULSE COAX TIP (DISPOSABLE) ×2
HOLDER FOLEY CATH W/STRAP (MISCELLANEOUS) ×3 IMPLANT
HOOD PEEL AWAY FLYTE STAYCOOL (MISCELLANEOUS) ×9 IMPLANT
IMMOBILIZER KNEE 20 (SOFTGOODS) ×3
IMMOBILIZER KNEE 20 THIGH 36 (SOFTGOODS) ×1 IMPLANT
MANIFOLD NEPTUNE II (INSTRUMENTS) ×3 IMPLANT
NDL SAFETY ECLIPSE 18X1.5 (NEEDLE) IMPLANT
NEEDLE HYPO 18GX1.5 SHARP (NEEDLE)
NEEDLE HYPO 22GX1.5 SAFETY (NEEDLE) ×3 IMPLANT
NS IRRIG 1000ML POUR BTL (IV SOLUTION) ×3 IMPLANT
PACK ICE MAXI GEL EZY WRAP (MISCELLANEOUS) ×3 IMPLANT
PACK TOTAL KNEE CUSTOM (KITS) ×3 IMPLANT
PADDING CAST COTTON 6X4 STRL (CAST SUPPLIES) ×3 IMPLANT
PIN STEINMAN FIXATION KNEE (PIN) ×3 IMPLANT
PIN THREADED HEADED SIGMA (PIN) ×3 IMPLANT
POSITIONER SURGICAL ARM (MISCELLANEOUS) ×3 IMPLANT
SET HNDPC FAN SPRY TIP SCT (DISPOSABLE) ×1 IMPLANT
STAPLER VISISTAT 35W (STAPLE) IMPLANT
STRIP CLOSURE SKIN 1/2X4 (GAUZE/BANDAGES/DRESSINGS) ×6 IMPLANT
SUT MNCRL AB 3-0 PS2 18 (SUTURE) ×3 IMPLANT
SUT VIC AB 0 CT1 36 (SUTURE) ×3 IMPLANT
SUT VIC AB 1 CT1 36 (SUTURE) ×6 IMPLANT
SUT VIC AB 2-0 CT1 27 (SUTURE) ×4
SUT VIC AB 2-0 CT1 TAPERPNT 27 (SUTURE) ×2 IMPLANT
SYR 3ML LL SCALE MARK (SYRINGE) IMPLANT
SYR CONTROL 10ML LL (SYRINGE) ×6 IMPLANT
TIBIAL BASE ROT PLAT SZ 8 KNEE (Knees) ×3 IMPLANT
TOWEL OR NON WOVEN STRL DISP B (DISPOSABLE) ×3 IMPLANT
TRAY FOLEY MTR SLVR 16FR STAT (SET/KITS/TRAYS/PACK) ×3 IMPLANT
WATER STERILE IRR 1000ML POUR (IV SOLUTION) ×6 IMPLANT
WRAP KNEE MAXI GEL POST OP (GAUZE/BANDAGES/DRESSINGS) ×3 IMPLANT
YANKAUER SUCT BULB TIP 10FT TU (MISCELLANEOUS) ×3 IMPLANT

## 2018-08-01 NOTE — Progress Notes (Signed)
ANTICOAGULATION CONSULT NOTE - Initial Consult  Pharmacy Consult for warfarin Indication: VTE prophylaxis, taking PTA for atrial fibrillation  Allergies  Allergen Reactions  . Penicillins     Childhood allergy Has patient had a PCN reaction causing immediate rash, facial/tongue/throat swelling, SOB or lightheadedness with hypotension: Unknown Has patient had a PCN reaction causing severe rash involving mucus membranes or skin necrosis: Unknown Has patient had a PCN reaction that required hospitalization: Unknown Has patient had a PCN reaction occurring within the last 10 years: No If all of the above answers are "NO", then may proceed with Cephalosporin use.     Patient Measurements: Height: 6\' 2"  (188 cm) Weight: 280 lb (127 kg) IBW/kg (Calculated) : 82.2  Vital Signs: Temp: 97.6 F (36.4 C) (08/30 1232) Temp Source: Oral (08/30 0844) BP: 93/60 (08/30 1315) Pulse Rate: 80 (08/30 1315)  Labs: Recent Labs    08/01/18 0913  LABPROT 15.5*  INR 1.24    Estimated Creatinine Clearance: 113.8 mL/min (by C-G formula based on SCr of 0.88 mg/dL).   Medical History: Past Medical History:  Diagnosis Date  . Arthritis    oa  . 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 Ascending aortic aneurysm ([place])    seen on 01-31-18 ct chest ; patient denies awareness  . Atrial fibrillation ([place])   . Cataracts, bilateral   . ED (erectile dysfunction)    has been prescribed Levitra and Viagrain in the past  . Hypertension   . Internal hemorrhoids   . Kidney stones   . Leg edema    recurrent cellulitis  . Scrotal abscess 3-03  . Shoulder arthritis   . Sigmoid diverticulosis     Assessment: Pharmacy consulted to dose and monitor warfarin in this 67 year old male. He was taking warfarin PTA for atrial fibrillation and it is being resumed post-op tonight.   Home regimen:   Warfarin 2.5 mg PO Tuesday, Friday, Sunday  Warfarin 5 mg PO Mon, Wed, Thurs, Sat  INR = 1.24 is subtherapeutic  on admission which is to be expected as medication would have been held prior to surgery.   Significant Events  TKA on 8/30  Today, 08/01/18  CBC (8/23) - Hgb 14.7, Plt 197 WNL  INR 1.2 is subtherapeutic  No bridging with enoxaparin per PA - warfarin only, ok to start this evening  Goal of Therapy:  INR 2-3 Monitor platelets by anticoagulation protocol: Yes   Plan:   Warfarin 5 mg PO this evening  INR daily  CBC with AM labs tomorrow - to be checked at least every 72 hours while inpatient  Monitor for signs/symptoms of bleeding  Lenis Noon, PharmD, BCPS Clinical Pharmacist 08/01/2018,2:01 PM

## 2018-08-01 NOTE — Anesthesia Preprocedure Evaluation (Addendum)
Anesthesia Evaluation  Patient identified by MRN, date of birth, ID band Patient awake    Reviewed: Allergy & Precautions, NPO status , Patient's Chart, lab work & pertinent test results  History of Anesthesia Complications Negative for: history of anesthetic complications  Airway Mallampati: II  TM Distance: >3 FB Neck ROM: Full    Dental  (+) Edentulous Upper, Edentulous Lower, Dental Advisory Given   Pulmonary neg pulmonary ROS,    Pulmonary exam normal        Cardiovascular hypertension, Pt. on home beta blockers and Pt. on medications Normal cardiovascular exam+ dysrhythmias Atrial Fibrillation   Chest CT 2019: IMPRESSION: 1. No evidence of asbestosis or asbestos related disease. 2.Stable 4.3 cm ascending aortic aneurysm.  Echo, 2014 The left ventricle is normal in size, wall thickness and wall motion with ejection fraction of 50-55%. The left ventricular diastolic function is abnormal. The aortic valve is not well visualized, unable to adequately assess function.   Neuro/Psych negative neurological ROS     GI/Hepatic negative GI ROS, Neg liver ROS,   Endo/Other  Morbid obesity  Renal/GU negative Renal ROS     Musculoskeletal  (+) Arthritis ,   Abdominal   Peds  Hematology negative hematology ROS (+)   Anesthesia Other Findings Day of surgery medications reviewed with the patient.  Reproductive/Obstetrics                            Anesthesia Physical Anesthesia Plan  ASA: III  Anesthesia Plan: Spinal   Post-op Pain Management:  Regional for Post-op pain   Induction:   PONV Risk Score and Plan: 2 and Ondansetron and Propofol infusion  Airway Management Planned: Natural Airway and Simple Face Mask  Additional Equipment:   Intra-op Plan:   Post-operative Plan:   Informed Consent:   Plan Discussed with:   Anesthesia Plan Comments:         Anesthesia  Quick Evaluation

## 2018-08-01 NOTE — Progress Notes (Signed)
Assisted Dr. Singer with right, ultrasound guided, adductor canal block. Side rails up, monitors on throughout procedure. See vital signs in flow sheet. Tolerated Procedure well.  

## 2018-08-01 NOTE — Brief Op Note (Signed)
08/01/2018  12:41 PM  PATIENT:  Jerome Hicks  67 y.o. male  PRE-OPERATIVE DIAGNOSIS:  RIGHT KNEE DEGENERATIVE JOINT DISEASE  POST-OPERATIVE DIAGNOSIS:  RIGHT KNEE DEGENERATIVE JOINT DISEASE  PROCEDURE:  Procedure(s): RIGHT TOTAL KNEE ARTHROPLASTY (Right)  SURGEON:  Surgeon(s) and Role:    Dorna Leitz, MD - Primary  PHYSICIAN ASSISTANT:   ASSISTANTS: jim bethune    ANESTHESIA:   spinal  EBL:  20 mL   BLOOD ADMINISTERED:none  DRAINS: none   LOCAL MEDICATIONS USED:  MARCAINE    and OTHER experel  SPECIMEN:  No Specimen  DISPOSITION OF SPECIMEN:  N/A  COUNTS:  YES  TOURNIQUET:   Total Tourniquet Time Documented: Thigh (Right) - 58 minutes Total: Thigh (Right) - 58 minutes   DICTATION: .Other Dictation: Dictation Number [id number]  PLAN OF CARE: Admit to inpatient   PATIENT DISPOSITION:  PACU - hemodynamically stable.   Delay start of Pharmacological VTE agent (>24hrs) due to surgical blood loss or risk of bleeding: no

## 2018-08-01 NOTE — Plan of Care (Signed)
Plan of care and postoperative course discussed with patient and his girlfriend

## 2018-08-01 NOTE — Transfer of Care (Signed)
Immediate Anesthesia Transfer of Care Note  Patient: Jerome Hicks  Procedure(s) Performed: RIGHT TOTAL KNEE ARTHROPLASTY (Right Knee)  Patient Location: PACU  Anesthesia Type:spinal  Level of Consciousness: awake, alert  and drowsy  Airway & Oxygen Therapy: Patient Spontanous Breathing and Patient connected to face mask oxygen  Post-op Assessment: Report given to RN and Post -op Vital signs reviewed and stable  Post vital signs: Reviewed and stable  Last Vitals:  Vitals Value Taken Time  BP 103/64 08/01/2018 12:32 PM  Temp    Pulse 75 08/01/2018 12:35 PM  Resp 12 08/01/2018 12:35 PM  SpO2 100 % 08/01/2018 12:35 PM  Vitals shown include unvalidated device data.  Last Pain:  Vitals:   08/01/18 0934  TempSrc:   PainSc: 0-No pain         Complications: No apparent anesthesia complications

## 2018-08-01 NOTE — Anesthesia Procedure Notes (Signed)
Spinal  Patient location during procedure: OR Start time: 08/01/2018 10:24 AM End time: 08/01/2018 10:33 AM Staffing Anesthesiologist: Duane Boston, MD Performed: anesthesiologist  Preanesthetic Checklist Completed: patient identified, surgical consent, pre-op evaluation, timeout performed, IV checked, risks and benefits discussed and monitors and equipment checked Spinal Block Patient position: sitting Prep: DuraPrep Patient monitoring: cardiac monitor, continuous pulse ox and blood pressure Approach: midline Location: L2-3 Injection technique: single-shot Needle Needle type: Pencan  Needle gauge: 24 G Needle length: 9 cm Additional Notes Functioning IV was confirmed and monitors were applied. Sterile prep and drape, including hand hygiene and sterile gloves were used. The patient was positioned and the spine was prepped. The skin was anesthetized with lidocaine.  Free flow of clear CSF was obtained prior to injecting local anesthetic into the CSF.  The spinal needle aspirated freely following injection.  The needle was carefully withdrawn.  The patient tolerated the procedure well.

## 2018-08-01 NOTE — Evaluation (Signed)
Physical Therapy Evaluation Patient Details Name: Jerome Hicks MRN: [id number] DOB: 01-21-1951 Today's Date: 08/01/2018   History of Present Illness  Pt s/p R TKR   Clinical Impression  Pt s/p R TKR and presents with decreased R LE strength/ROM and post op pain limiting functional mobility.  Pt should progress to dc home with family assist.    Follow Up Recommendations Follow surgeon's recommendation for DC plan and follow-up therapies;Home health PT    Equipment Recommendations  None recommended by PT    Recommendations for Other Services       Precautions / Restrictions Precautions Precautions: Knee;Fall Required Braces or Orthoses: Knee Immobilizer - Right Knee Immobilizer - Right: Discontinue once straight leg raise with < 10 degree lag Restrictions Weight Bearing Restrictions: No Other Position/Activity Restrictions: WBAT      Mobility  Bed Mobility Overal bed mobility: Needs Assistance Bed Mobility: Supine to Sit     Supine to sit: Min assist     General bed mobility comments: cues for sequence and use of L LE to self assist  Transfers Overall transfer level: Needs assistance Equipment used: Rolling walker (2 wheeled) Transfers: Sit to/from Stand Sit to Stand: Min assist;From elevated surface         General transfer comment: cues for LE management and use of UEs to self assist  Ambulation/Gait Ambulation/Gait assistance: Min assist Gait Distance (Feet): 45 Feet Assistive device: Rolling walker (2 wheeled) Gait Pattern/deviations: Step-to pattern;Decreased step length - right;Decreased step length - left;Shuffle;Trunk flexed Gait velocity: decr   General Gait Details: cues for sequence, posture and position from [employer]            Wheelchair Mobility    Modified Rankin (Stroke Patients Only)       Balance Overall balance assessment: Mild deficits observed, not formally tested                                           Pertinent Vitals/Pain Pain Assessment: 0-10 Pain Score: 4  Pain Location: R knee Pain Descriptors / Indicators: Aching;Sore Pain Intervention(s): Limited activity within patient's tolerance;Monitored during session;Premedicated before session;Ice applied    Home Living Family/patient expects to be discharged to:: Private residence Living Arrangements: Spouse/significant other Available Help at Discharge: Family Type of Home: House Home Access: [place]: One [place]: Environmental consultant - 2 wheels;Bedside commode      Prior Function Level of Independence: Independent               Hand Dominance        Extremity/Trunk Assessment   Upper Extremity Assessment Upper Extremity Assessment: Overall WFL for tasks assessed    Lower Extremity Assessment Lower Extremity Assessment: RLE deficits/detail    Cervical / Trunk Assessment Cervical / Trunk Assessment: Normal  Communication   Communication: No difficulties  Cognition Arousal/Alertness: Awake/alert Behavior During Therapy: WFL for tasks assessed/performed Overall Cognitive Status: Within Functional Limits for tasks assessed                                        General Comments      Exercises Total Joint Exercises Ankle Circles/Pumps: AROM;Both;15 reps;Supine   Assessment/Plan    PT Assessment Patient needs continued PT services  PT  Problem List Decreased strength;Decreased range of motion;Decreased activity tolerance;Decreased mobility;Decreased knowledge of use of DME;Obesity;Pain       PT Treatment Interventions DME instruction;Gait training;Functional mobility training;Therapeutic activities;Therapeutic exercise;Patient/family education    PT Goals (Current goals can be found in the Care Plan section)  Acute Rehab PT Goals Patient Stated Goal: Regain IND PT Goal Formulation: With patient Time For Goal Achievement: 08/08/18 Potential to Achieve  Goals: Good    Frequency 7X/week   Barriers to discharge        Co-evaluation               AM-PAC PT "6 Clicks" Daily Activity  Outcome Measure Difficulty turning over in bed (including adjusting bedclothes, sheets and blankets)?: Unable Difficulty moving from lying on back to sitting on the side of the bed? : Unable Difficulty sitting down on and standing up from a chair with arms (e.g., wheelchair, bedside commode, etc,.)?: Unable Help needed moving to and from a bed to chair (including a wheelchair)?: A Little Help needed walking in hospital room?: A Little Help needed climbing 3-5 steps with a railing? : A Lot 6 Click Score: 11    End of Session Equipment Utilized During Treatment: Gait belt;Right knee immobilizer Activity Tolerance: Patient tolerated treatment well Patient left: in chair;with call bell/phone within reach;with family/visitor present Nurse Communication: Mobility status PT Visit Diagnosis: Difficulty in walking, not elsewhere classified (R26.2)    Time: 2297-9892 PT Time Calculation (min) (ACUTE ONLY): 27 min   Charges:   PT Evaluation $PT Eval Low Complexity: 1 Low PT Treatments $Gait Training: 8-22 mins        Pg [phone]   Tyresse Jayson 08/01/2018, 5:11 PM

## 2018-08-01 NOTE — Op Note (Signed)
NAME: Jerome Hicks, Jerome Hicks MEDICAL RECORD [id number] ACCOUNT [id number] DATE OF BIRTH:[birth year] FACILITY: [facility] LOCATION: [facility] PHYSICIAN:Charleen Madera L. Karrigan Messamore, MD  OPERATIVE REPORT  DATE OF PROCEDURE:  08/01/2018  PREOPERATIVE DIAGNOSIS:  Degenerative joint disease, right knee.  POSTOPERATIVE DIAGNOSIS:  Degenerative joint disease, right knee.  PROCEDURE:  Right total knee replacement with an Attune system, size 6 femur, size 8 tibia, 5 mm bridging bearing, and a 41 mm all polyethylene patella.  SURGEON:  Dorna Leitz, MD  ASSISTANT:  Gaspar Skeeters PA-C.  ANESTHESIA:  Spinal.  BRIEF HISTORY:  The patient is a male with a long history significant complaints of right knee pain to be treated conservatively for a prolonged period of time and after failure of all conservative care, is taken to the operating room for right total  knee replacement.  The patient was having night pain and light activity pain and failed conservative care including activity modification, injection therapy and viscosupplementation.  The patient was brought to the operating room for right total knee  replacement with x-ray showing bone-on-bone change.  DESCRIPTION OF PROCEDURE:  The patient brought to the operating room after adequate anesthesia was obtained with spinal anesthetic, the patient was placed supine on the operating table.  Right leg was prepped and draped in usual sterile fashion.   Following this, the leg was exsanguinated, blood pressure tourniquet inflated to 300 mmHg.  Following this, a midline incision was made.  The subcutaneous tissue dissected down to the level of the extensor mechanism and a medial parapatellar arthrotomy  was undertaken.  Once this was completed, attention was turned towards the right knee where medial and lateral meniscus were removed, retropatellar fat pad, synovium on the anterior aspect of the femur, and the anterior and posterior cruciates.  Once  that was completed, attention  was turned towards intramedullary pilot hole being drilled in the femur.  A 4 degree valgus inclination cut is made and anterior and posterior cuts were made chamfers and box, after he sized to a 6.    At this point, attention was turned to the tibia where it was cut perpendicular to its long axis.  It sized to an 8 and it is drilled and keeled trial was put in place.  Excellent range of motion and stability were achieved.  Attention was turned to the  patella.  It was cut down to the level of 13 mm and a 41 paddle is placed and lugs were drilled at this point.  Following this, attention was turned towards removal of all trial components.  The posterior osteophytes were removed and the final components  were then cemented into place after the knee was copiously and thoroughly irrigated with pulsatile lavage irrigation.  The final components were cemented in place, size 8 tibia, size 6 femur.  A 5 mm bridging bearing trial was placed and a 41 all poly  patella was placed and held with a clamp.  Once this was completed, the Exparel was instilled throughout the knee for postoperative pain control and the tourniquet was then let down when the cement was completely hardened.  The knee was irrigated.  At  this point, the final poly was placed.  The medial parapatellar arthrotomy was closed with #1 Vicryl running.  The skin was closed with 0 and 2-0 Vicryl and 3-0 Monocryl subcuticular.  Benzoin and Steri-Strips were applied.  Sterile compressive dressing  was applied.    The patient was taken to recovery was noted to be in satisfactory condition.  Estimated blood loss for the procedure is minimal.  AN/NUANCE  D:08/01/2018 T:08/01/2018 JOB:[id number]

## 2018-08-01 NOTE — Discharge Instructions (Signed)

## 2018-08-01 NOTE — Anesthesia Procedure Notes (Signed)
Anesthesia Regional Block: Adductor canal block   Pre-Anesthetic Checklist: ,, timeout performed, Correct Patient, Correct Site, Correct Laterality, Correct Procedure, Correct Position, site marked, Risks and benefits discussed,  Surgical consent,  Pre-op evaluation,  At surgeon's request and post-op pain management  Laterality: Right  Prep: chloraprep       Needles:  Injection technique: Single-shot  Needle Type: Stimulator Needle - 80     Needle Length: 10cm  Needle Gauge: 21     Additional Needles:   Narrative:  Start time: 08/01/2018 9:24 AM End time: 08/01/2018 9:34 AM Injection made incrementally with aspirations every 5 mL.  Performed by: Personally

## 2018-08-02 DIAGNOSIS — Z96651 Presence of right artificial knee joint: Secondary | ICD-10-CM | POA: Diagnosis not present

## 2018-08-02 DIAGNOSIS — I10 Essential (primary) hypertension: Secondary | ICD-10-CM | POA: Diagnosis not present

## 2018-08-02 DIAGNOSIS — Z79899 Other long term (current) drug therapy: Secondary | ICD-10-CM | POA: Diagnosis not present

## 2018-08-02 DIAGNOSIS — R262 Difficulty in walking, not elsewhere classified: Secondary | ICD-10-CM | POA: Diagnosis not present

## 2018-08-02 DIAGNOSIS — I4891 Unspecified atrial fibrillation: Secondary | ICD-10-CM | POA: Diagnosis not present

## 2018-08-02 DIAGNOSIS — Z880 Allergy status to penicillin: Secondary | ICD-10-CM | POA: Diagnosis not present

## 2018-08-02 DIAGNOSIS — I712 Thoracic aortic aneurysm, without rupture: Secondary | ICD-10-CM | POA: Diagnosis not present

## 2018-08-02 DIAGNOSIS — M1711 Unilateral primary osteoarthritis, right knee: Secondary | ICD-10-CM | POA: Diagnosis not present

## 2018-08-02 DIAGNOSIS — Z96659 Presence of unspecified artificial knee joint: Secondary | ICD-10-CM

## 2018-08-02 DIAGNOSIS — Z87442 Personal history of urinary calculi: Secondary | ICD-10-CM | POA: Diagnosis not present

## 2018-08-02 LAB — CBC
HCT: 35.5 % — ABNORMAL LOW (ref 39.0–52.0)
HEMOGLOBIN: 12.1 g/dL — AB (ref 13.0–17.0)
MCH: 31.4 pg (ref 26.0–34.0)
MCHC: 34.1 g/dL (ref 30.0–36.0)
MCV: 92.2 fL (ref 78.0–100.0)
Platelets: 167 10*3/uL (ref 150–400)
RBC: 3.85 MIL/uL — AB (ref 4.22–5.81)
RDW: 12.7 % (ref 11.5–15.5)
WBC: 13.1 10*3/uL — ABNORMAL HIGH (ref 4.0–10.5)

## 2018-08-02 LAB — BASIC METABOLIC PANEL
ANION GAP: 6 (ref 5–15)
BUN: 21 mg/dL (ref 8–23)
CHLORIDE: 108 mmol/L (ref 98–111)
CO2: 26 mmol/L (ref 22–32)
Calcium: 8.6 mg/dL — ABNORMAL LOW (ref 8.9–10.3)
Creatinine, Ser: 0.94 mg/dL (ref 0.61–1.24)
GFR calc non Af Amer: >60 mL/min (ref >60)
Glucose, Bld: 186 mg/dL — ABNORMAL HIGH (ref 70–99)
POTASSIUM: 4.5 mmol/L (ref 3.5–5.1)
Sodium: 140 mmol/L (ref 135–145)

## 2018-08-02 LAB — PROTIME-INR
INR: 1.28
PROTHROMBIN TIME: 15.9 s — AB (ref 11.4–15.2)

## 2018-08-02 MED ORDER — FUROSEMIDE 40 MG PO TABS
40.0000 mg | ORAL_TABLET | Freq: Once | ORAL | Status: AC
  Administered 2018-08-02: 40 mg via ORAL
  Filled 2018-08-02: qty 1

## 2018-08-02 NOTE — Discharge Summary (Signed)
Patient ID: Jerome Hicks MRN: [id number] DOB/AGE: Apr 05, 1951 67 y.o.  Admit date: 08/01/2018 Discharge date: 08/02/2018  Admission Diagnoses:  Active Problems:   Primary osteoarthritis of right knee   Discharge Diagnoses:  Same  Past Medical History:  Diagnosis Date  . Arthritis    oa  . 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 Ascending aortic aneurysm ([place])    seen on 01-31-18 ct chest ; patient denies awareness  . Atrial fibrillation ([place])   . Cataracts, bilateral   . ED (erectile dysfunction)    has been prescribed Levitra and Viagrain in the past  . Hypertension   . Internal hemorrhoids   . Kidney stones   . Leg edema    recurrent cellulitis  . Scrotal abscess 3-03  . Shoulder arthritis   . Sigmoid diverticulosis     Surgeries: Procedure(s): RIGHT TOTAL KNEE ARTHROPLASTY on 08/01/2018   Consultants:   Discharged Condition: Improved  Hospital Course: Jerome Hicks is an 67 y.o. male who was admitted 08/01/2018 for operative treatment of<principal problem not specified>. Patient has severe unremitting pain that affects sleep, daily activities, and work/hobbies. After pre-op clearance the patient was taken to the operating room on 08/01/2018 and underwent  Procedure(s): RIGHT TOTAL KNEE ARTHROPLASTY.    Patient was given perioperative antibiotics:  Anti-infectives (From admission, onward)   Start     Dose/Rate Route Frequency Ordered Stop   08/01/18 1600  clindamycin (CLEOCIN) IVPB 600 mg     600 mg 100 mL/hr over 30 Minutes Intravenous Every 6 hours 08/01/18 1449 08/01/18 2330   08/01/18 0830  clindamycin (CLEOCIN) IVPB 900 mg     900 mg 100 mL/hr over 30 Minutes Intravenous On call to O.R. 08/01/18 0086 08/01/18 1032       Patient was given sequential compression devices, early ambulation, and chemoprophylaxis to prevent DVT.  Patient benefited maximally from hospital stay and there were no complications.    Recent vital signs:  Patient Vitals for the  past 24 hrs:  BP Temp Temp src Pulse Resp SpO2  08/02/18 0532 116/78 (!) 97.5 F (36.4 C) - 63 16 98 %  08/02/18 0135 109/80 - - 76 - 97 %  08/02/18 0134 - 98.2 F (36.8 C) Oral - - -  08/01/18 1738 121/68 - - 87 18 98 %  08/01/18 1649 135/87 - - 83 17 96 %  08/01/18 1547 132/90 - - 75 16 98 %  08/01/18 1440 110/72 97.7 F (36.5 C) Oral 90 17 95 %  08/01/18 1414 104/85 97.6 F (36.4 C) - 85 14 99 %  08/01/18 1400 104/85 - - 80 19 95 %  08/01/18 1345 94/68 - - 69 14 98 %  08/01/18 1330 101/67 - - 68 15 98 %  08/01/18 1315 93/60 - - 80 17 96 %  08/01/18 1300 91/63 - - (!) 38 18 97 %  08/01/18 1245 120/83 - - 72 13 96 %  08/01/18 1232 103/64 97.6 F (36.4 C) - 77 12 93 %  08/01/18 0950 125/77 - - 83 18 100 %  08/01/18 0945 120/74 - - 83 14 100 %  08/01/18 0940 117/89 - - 76 11 99 %  08/01/18 0935 - - - 78 10 98 %  08/01/18 0934 104/72 - - 84 13 100 %  08/01/18 0933 - - - 82 13 99 %  08/01/18 0932 - - - 73 13 99 %  08/01/18 0931 - - - 77 14  99 %  08/01/18 0930 118/84 - - 80 16 100 %  08/01/18 0929 - - - 82 17 100 %  08/01/18 0928 - - - 76 13 100 %  08/01/18 0927 - - - 76 16 100 %  08/01/18 0926 119/64 - - 63 (!) 21 100 %  08/01/18 0925 - - - 80 15 100 %  08/01/18 0924 - - - 80 15 100 %  08/01/18 0923 - - - 83 19 100 %  08/01/18 0922 - - - 76 17 100 %  08/01/18 0921 - - - 71 19 100 %  08/01/18 0920 - - - 85 16 100 %  08/01/18 0919 - - - 76 18 100 %  08/01/18 0918 - - - 77 15 100 %  08/01/18 0917 - - - 70 19 100 %  08/01/18 0916 - - - 71 19 100 %  08/01/18 0915 - - - 77 (!) 22 100 %  08/01/18 0914 - - - - (!) 21 -  08/01/18 0913 - - - - 18 -  08/01/18 0912 - - - - 20 -     Recent laboratory studies:  Recent Labs    08/01/18 0913 08/02/18 0418  WBC  --  13.1*  HGB  --  12.1*  HCT  --  35.5*  PLT  --  167  NA  --  140  K  --  4.5  CL  --  108  CO2  --  26  BUN  --  21  CREATININE  --  0.94  GLUCOSE  --  186*  INR 1.24 1.28  CALCIUM  --  8.6*      Discharge Medications:   Allergies as of 08/02/2018      Reactions   Penicillins    Childhood allergy Has patient had a PCN reaction causing immediate rash, facial/tongue/throat swelling, SOB or lightheadedness with hypotension: Unknown Has patient had a PCN reaction causing severe rash involving mucus membranes or skin necrosis: Unknown Has patient had a PCN reaction that required hospitalization: Unknown Has patient had a PCN reaction occurring within the last 10 years: No If all of the above answers are "NO", then may proceed with Cephalosporin use.      Medication List    STOP taking these medications   traMADol 50 MG tablet Commonly known as:  ULTRAM     TAKE these medications   carvedilol 3.125 MG tablet Commonly known as:  COREG Take 1 tablet (3.125 mg total) by mouth 2 (two) times daily with a meal.   clotrimazole-betamethasone cream Commonly known as:  LOTRISONE Apply 1 application topically daily as needed (rash).   docusate sodium 100 MG capsule Commonly known as:  COLACE Take 1 capsule (100 mg total) by mouth 2 (two) times daily.   doxazosin 4 MG tablet Commonly known as:  CARDURA Take 1 tablet (4 mg total) by mouth daily.   furosemide 40 MG tablet Commonly known as:  LASIX Take 1-2 tablets (40-80 mg total) by mouth daily as needed for fluid or edema.   lisinopril 10 MG tablet Commonly known as:  PRINIVIL,ZESTRIL Take 1 tablet (10 mg total) by mouth daily.   LUBRICATING EYE DROPS OP Place 1 drop into both eyes daily as needed (dry eyes).   meclizine 25 MG tablet Commonly known as:  ANTIVERT TAKE 1 TABLET THREE TIMES DAILY AS NEEDED  FOR  DIZZINESS  OR  NAUSEA  AS DIRECTED What changed:  See the new  instructions.   multivitamin with minerals Tabs tablet Take 1 tablet by mouth daily.   oxyCODONE-acetaminophen 5-325 MG tablet Commonly known as:  PERCOCET/ROXICET Take 1-2 tablets by mouth every 6 (six) hours as needed for severe pain.    potassium chloride 10 MEQ tablet Commonly known as:  K-DUR,KLOR-CON Take 1 tablet (10 mEq total) by mouth daily.   tiZANidine 2 MG tablet Commonly known as:  ZANAFLEX Take 1 tablet (2 mg total) by mouth every 8 (eight) hours as needed for muscle spasms.   vitamin B-12 1000 MCG tablet Commonly known as:  CYANOCOBALAMIN Take 1,000 mcg by mouth daily.   warfarin 5 MG tablet Commonly known as:  COUMADIN Take as directed. If you are unsure how to take this medication, talk to your nurse or doctor. Original instructions:  Take 1 tablet (5 mg total) by mouth daily. What changed:    how much to take  when to take this  additional instructions            Discharge Care Instructions  (From admission, onward)         Start     Ordered   08/02/18 0000  Weight bearing as tolerated    Question Answer Comment  Laterality right   Extremity Lower      08/02/18 0846          Diagnostic Studies: Dg Chest 2 View  Result Date: 07/25/2018 CLINICAL DATA:  Preoperative evaluation for upcoming knee replacement EXAM: CHEST - 2 VIEW COMPARISON:  None. FINDINGS: The heart size and mediastinal contours are within normal limits. Both lungs are clear. The visualized skeletal structures are unremarkable. Postsurgical changes in the upper abdomen are noted consistent with prior cholecystectomy. IMPRESSION: No active cardiopulmonary disease. Electronically Signed   By: Inez Catalina M.D.   On: 07/25/2018 11:48    Disposition: Discharge disposition: 01-Home or Self Care       Discharge Instructions    CPM   Complete by:  As directed    Continuous passive motion machine (CPM):      Use the CPM from 0 to 60 for 8 hours per day.      You may increase by 5-10 per day.  You may break it up into 2 or 3 sessions per day.      Use CPM for 1-2 weeks or until you are told to stop.   Call MD / Call 911   Complete by:  As directed    If you experience chest pain or shortness of breath, CALL  911 and be transported to the hospital emergency room.  If you develope a fever above 101 F, pus (white drainage) or increased drainage or redness at the wound, or calf pain, call your surgeon's office.   Constipation Prevention   Complete by:  As directed    Drink plenty of fluids.  Prune juice may be helpful.  You may use a stool softener, such as Colace (over the counter) 100 mg twice a day.  Use MiraLax (over the counter) for constipation as needed.   Diet general   Complete by:  As directed    Do not put a pillow under the knee. Place it under the heel.   Complete by:  As directed    Increase activity slowly as tolerated   Complete by:  As directed    Weight bearing as tolerated   Complete by:  As directed    Laterality:  right   Extremity:  Lower  Follow-up Information    Dorna Leitz, MD. Schedule an appointment as soon as possible for a visit in 2 weeks.   Specialty:  Orthopedic Surgery Contact information: [place] [address] [phone]            Signed: Erlene Senters 08/02/2018, 8:47 AM

## 2018-08-02 NOTE — Care Management Note (Signed)
Case Management Note  Patient Details  Name: HOOPER PETTEWAY MRN: [id number] Date of Birth: 1951-05-28  Subjective/Objective:     Right TKA               Action/Plan: NCM spoke to pt and states his girlfriend, Jacqlyn Larsen will assist him at home. Offered choice for HH/list provided. Pt agreeable to [facility]. Contacted [facility] and they can accept referral. Has RW and cane at home.   Expected Discharge Date:  08/02/18               Expected Discharge Plan:  [place]  In-House Referral:  NA  Discharge planning Services  CM Consult  Post Acute Care Choice:  Home Health Choice offered to:  Patient  DME Arranged:  N/A DME Agency:  NA  HH Arranged:  PT [place] Agency:  [facility] (formerly [employer])  Status of Service:  Completed, signed off  If discussed at [employer], dates discussed:    Additional Comments:  Erenest Rasher, RN 08/02/2018, 10:42 AM

## 2018-08-02 NOTE — Progress Notes (Signed)
Physical Therapy Treatment Patient Details Name: Jerome Hicks MRN: [id number] DOB: [birth year] Today's Date: 08/02/2018    History of Present Illness Pt s/p R TKR     PT Comments    Pt continues to progress well and is very motivated but cautioned about over-doing initially.  Pt reviewed stairs, car transfers and home therex program.   Follow Up Recommendations  Follow surgeon's recommendation for DC plan and follow-up therapies;Home health PT     Equipment Recommendations  None recommended by PT    Recommendations for Other Services       Precautions / Restrictions Precautions Precautions: Knee;Fall Required Braces or Orthoses: Knee Immobilizer - Right Knee Immobilizer - Right: Discontinue once straight leg raise with < 10 degree lag Restrictions Weight Bearing Restrictions: No Other Position/Activity Restrictions: WBAT    Mobility  Bed Mobility Overal bed mobility: Needs Assistance Bed Mobility: Sit to Supine     Supine to sit: Min guard Sit to supine: Supervision   General bed mobility comments: cues for sequence and use of L LE to self assist  Transfers Overall transfer level: Needs assistance Equipment used: Rolling walker (2 wheeled) Transfers: Sit to/from Stand Sit to Stand: Min guard;Supervision         General transfer comment: cues for LE management and use of UEs to self assist  Ambulation/Gait Ambulation/Gait assistance: Min guard;Supervision Gait Distance (Feet): 200 Feet Assistive device: Rolling walker (2 wheeled) Gait Pattern/deviations: Step-to pattern;Decreased step length - right;Decreased step length - left;Shuffle;Trunk flexed Gait velocity: decr   General Gait Details: cues for sequence, posture and position from RW   Stairs Stairs: Yes Stairs assistance: Min guard Stair Management: Two rails;Step to pattern;Forwards Number of Stairs: 6 General stair comments: cues for sequence and foot placement   Wheelchair Mobility     Modified Rankin (Stroke Patients Only)       Balance Overall balance assessment: Mild deficits observed, not formally tested                                          Cognition Arousal/Alertness: Awake/alert Behavior During Therapy: WFL for tasks assessed/performed Overall Cognitive Status: Within Functional Limits for tasks assessed                                        Exercises Total Joint Exercises Ankle Circles/Pumps: AROM;Both;15 reps;Supine Quad Sets: AROM;Both;10 reps;Supine Heel Slides: AAROM;Right;15 reps;Supine Straight Leg Raises: AAROM;AROM;Right;10 reps;Supine Goniometric ROM: AAROM R knee -10 - 70    General Comments        Pertinent Vitals/Pain Pain Assessment: 0-10 Pain Score: 4  Pain Location: R knee Pain Descriptors / Indicators: Aching;Sore Pain Intervention(s): Limited activity within patient's tolerance;Monitored during session;Premedicated before session;Ice applied    Home Living                      Prior Function            PT Goals (current goals can now be found in the care plan section) Acute Rehab PT Goals Patient Stated Goal: Regain IND PT Goal Formulation: With patient Time For Goal Achievement: 08/08/18 Potential to Achieve Goals: Good Progress towards PT goals: Progressing toward goals    Frequency    7X/week      PT Plan  Current plan remains appropriate    Co-evaluation              AM-PAC PT "6 Clicks" Daily Activity  Outcome Measure  Difficulty turning over in bed (including adjusting bedclothes, sheets and blankets)?: A Lot Difficulty moving from lying on back to sitting on the side of the bed? : A Lot Difficulty sitting down on and standing up from a chair with arms (e.g., wheelchair, bedside commode, etc,.)?: A Lot Help needed moving to and from a bed to chair (including a wheelchair)?: A Little Help needed walking in hospital room?: A Little Help needed  climbing 3-5 steps with a railing? : A Little 6 Click Score: 15    End of Session Equipment Utilized During Treatment: Gait belt Activity Tolerance: Patient tolerated treatment well Patient left: with call bell/phone within reach;with family/visitor present;in bed Nurse Communication: Mobility status PT Visit Diagnosis: Difficulty in walking, not elsewhere classified (R26.2)     Time: 6153-7943 PT Time Calculation (min) (ACUTE ONLY): 53 min  Charges:  $Gait Training: 8-22 mins $Therapeutic Exercise: 8-22 mins $Therapeutic Activity: 8-22 mins                     Pg [phone]    Corey Laski 08/02/2018, 12:57 PM

## 2018-08-02 NOTE — Care Management Obs Status (Signed)
[place] NOTIFICATION   Patient Details  Name: MADDON HORTON MRN: [id number] Date of Birth: [birth year]   Medicare Observation Status Notification Given:  Yes    Erenest Rasher, RN 08/02/2018, 12:50 PM

## 2018-08-02 NOTE — Care Management (Signed)
Contacted UR team for potential Code 44. Jonnie Finner RN CCM Case Mgmt phone [phone]

## 2018-08-02 NOTE — Progress Notes (Signed)
Physical Therapy Treatment Patient Details Name: Jerome Hicks MRN: [id number] DOB: 1951-06-18 Today's Date: 08/02/2018    History of Present Illness Pt s/p R TKR     PT Comments    Pt very motivated and progressing steadily with mobility.  Pt hopeful for dc home this date.   Follow Up Recommendations  Follow surgeon's recommendation for DC plan and follow-up therapies;Home health PT     Equipment Recommendations  None recommended by PT    Recommendations for Other Services       Precautions / Restrictions Precautions Precautions: Knee;Fall Required Braces or Orthoses: Knee Immobilizer - Right Knee Immobilizer - Right: Discontinue once straight leg raise with < 10 degree lag(Pt performed IND SLR this am) Restrictions Weight Bearing Restrictions: No Other Position/Activity Restrictions: WBAT    Mobility  Bed Mobility Overal bed mobility: Needs Assistance Bed Mobility: Supine to Sit     Supine to sit: Min guard     General bed mobility comments: cues for sequence and use of L LE to self assist  Transfers Overall transfer level: Needs assistance Equipment used: Rolling walker (2 wheeled) Transfers: Sit to/from Stand Sit to Stand: Min assist;Min guard         General transfer comment: cues for LE management and use of UEs to self assist  Ambulation/Gait Ambulation/Gait assistance: Min assist;Min guard Gait Distance (Feet): 150 Feet Assistive device: Rolling walker (2 wheeled) Gait Pattern/deviations: Step-to pattern;Decreased step length - right;Decreased step length - left;Shuffle;Trunk flexed Gait velocity: decr   General Gait Details: cues for sequence, posture and position from [employer]             Wheelchair Mobility    Modified Rankin (Stroke Patients Only)       Balance                                            Cognition Arousal/Alertness: Awake/alert Behavior During Therapy: WFL for tasks  assessed/performed Overall Cognitive Status: Within Functional Limits for tasks assessed                                        Exercises Total Joint Exercises Ankle Circles/Pumps: AROM;Both;15 reps;Supine Quad Sets: AROM;Both;10 reps;Supine Heel Slides: AAROM;Right;15 reps;Supine Straight Leg Raises: AAROM;AROM;Right;10 reps;Supine Goniometric ROM: AAROM R knee -10 - 70    General Comments        Pertinent Vitals/Pain Pain Assessment: 0-10 Pain Score: 4  Pain Location: R knee Pain Descriptors / Indicators: Aching;Sore Pain Intervention(s): Limited activity within patient's tolerance;Monitored during session;Premedicated before session;Ice applied    Home Living                      Prior Function            PT Goals (current goals can now be found in the care plan section) Acute Rehab PT Goals Patient Stated Goal: Regain IND PT Goal Formulation: With patient Time For Goal Achievement: 08/08/18 Potential to Achieve Goals: Good Progress towards PT goals: Progressing toward goals    Frequency    7X/week      PT Plan Current plan remains appropriate    Co-evaluation              AM-PAC PT "6 Clicks"  Daily Activity  Outcome Measure  Difficulty turning over in bed (including adjusting bedclothes, sheets and blankets)?: A Lot Difficulty moving from lying on back to sitting on the side of the bed? : A Lot Difficulty sitting down on and standing up from a chair with arms (e.g., wheelchair, bedside commode, etc,.)?: A Lot Help needed moving to and from a bed to chair (including a wheelchair)?: A Little Help needed walking in hospital room?: A Little Help needed climbing 3-5 steps with a railing? : A Little 6 Click Score: 15    End of Session Equipment Utilized During Treatment: Gait belt Activity Tolerance: Patient tolerated treatment well Patient left: in chair;with call bell/phone within reach;with family/visitor present Nurse  Communication: Mobility status PT Visit Diagnosis: Difficulty in walking, not elsewhere classified (R26.2)     Time: 0258-5277 PT Time Calculation (min) (ACUTE ONLY): 45 min  Charges:  $Gait Training: 23-37 mins $Therapeutic Exercise: 8-22 mins                     Pg [phone]    Cassidee Deats 08/02/2018, 12:53 PM

## 2018-08-02 NOTE — Care Management CC44 (Signed)
Condition Code 44 Documentation Completed  Patient Details  Name: Jerome Hicks MRN: [id number] Date of Birth: [birth year]   Condition Code 44 given:  Yes Patient signature on Condition Code 44 notice:  Yes Documentation of 2 MD's agreement:  Yes Code 44 added to claim:  Yes    Erenest Rasher, RN 08/02/2018, 12:51 PM

## 2018-08-02 NOTE — Progress Notes (Signed)
Subjective: 1 Day Post-Op Procedure(s) (LRB): RIGHT TOTAL KNEE ARTHROPLASTY (Right) Patient reports pain as mild.  The patient is ambulating in the hall with physical therapy.  Foley catheter was removed earlier this morning.  He has not voided yet.  He wants to go home today as he is doing very well.  He has resumed oral Coumadin.  Objective: Vital signs in last 24 hours: Temp:  [97.5 F (36.4 C)-98.2 F (36.8 C)] 97.5 F (36.4 C) (08/31 0532) Pulse Rate:  [38-90] 63 (08/31 0532) Resp:  [10-22] 16 (08/31 0532) BP: (91-135)/(60-90) 116/78 (08/31 0532) SpO2:  [93 %-100 %] 98 % (08/31 0532) Weight:  [585 kg] 127 kg (08/30 0844)  Intake/Output from previous day: 08/30 0701 - 08/31 0700 In: 4689.5 [P.O.:560; I.V.:2929.5; IV Piggyback:1200] Out: 1230 [Urine:1210; Blood:20] Intake/Output this shift: Total I/O In: 240 [P.O.:240] Out: -   Recent Labs    08/02/18 0418  HGB 12.1*   Recent Labs    08/02/18 0418  WBC 13.1*  RBC 3.85*  HCT 35.5*  PLT 167   Recent Labs    08/02/18 0418  NA 140  K 4.5  CL 108  CO2 26  BUN 21  CREATININE 0.94  GLUCOSE 186*  CALCIUM 8.6*   Recent Labs    08/01/18 0913 08/02/18 0418  INR 1.24 1.28  Right knee exam:  Neurovascular intact Sensation intact distally Intact pulses distally Dorsiflexion/Plantar flexion intact Incision: dressing C/D/I Compartment soft   Patient's anticipated LOS is less than 2 midnights, meeting these requirements: - Younger than 29 - Lives within 1 hour of care - Has a competent adult at home to recover with post-op recover - NO history of  - Chronic pain requiring opiods  - Diabetes  - Coronary Artery Disease  - Heart failure  - Heart attack  - Stroke  - DVT/VTE  - Cardiac arrhythmia  - Respiratory Failure/COPD  - Renal failure  - Anemia  - Advanced Liver disease      Assessment/Plan: 1 Day Post-Op Procedure(s) (LRB): RIGHT TOTAL KNEE ARTHROPLASTY (Right) Plan: Discharge home with  physical therapy. Weight-bear as tolerated on right Resume Coumadin that he was taking preoperatively. Follow-up with Dr. Berenice Primas in 10 to 14 days.     Erlene Senters 08/02/2018, 8:43 AM

## 2018-08-03 DIAGNOSIS — I480 Paroxysmal atrial fibrillation: Secondary | ICD-10-CM | POA: Diagnosis not present

## 2018-08-03 DIAGNOSIS — Z471 Aftercare following joint replacement surgery: Secondary | ICD-10-CM | POA: Diagnosis not present

## 2018-08-03 DIAGNOSIS — I712 Thoracic aortic aneurysm, without rupture: Secondary | ICD-10-CM | POA: Diagnosis not present

## 2018-08-03 DIAGNOSIS — Z7901 Long term (current) use of anticoagulants: Secondary | ICD-10-CM | POA: Diagnosis not present

## 2018-08-03 DIAGNOSIS — I10 Essential (primary) hypertension: Secondary | ICD-10-CM | POA: Diagnosis not present

## 2018-08-03 DIAGNOSIS — Z96651 Presence of right artificial knee joint: Secondary | ICD-10-CM | POA: Diagnosis not present

## 2018-08-04 NOTE — Anesthesia Postprocedure Evaluation (Signed)
Anesthesia Post Note  Patient: Jerome Hicks  Procedure(s) Performed: RIGHT TOTAL KNEE ARTHROPLASTY (Right Knee)     Patient location during evaluation: PACU Anesthesia Type: Spinal Level of consciousness: oriented and awake and alert Pain management: pain level controlled Vital Signs Assessment: post-procedure vital signs reviewed and stable Respiratory status: spontaneous breathing, respiratory function stable and patient connected to nasal cannula oxygen Cardiovascular status: blood pressure returned to baseline and stable Postop Assessment: no headache, no backache, no apparent nausea or vomiting, spinal receding and patient able to bend at knees Anesthetic complications: no    Last Vitals:  Vitals:   08/02/18 0532 08/02/18 0920  BP: 116/78 136/88  Pulse: 63 94  Resp: 16 18  Temp: (!) 36.4 C 36.7 C  SpO2: 98% 99%    Last Pain:  Vitals:   08/02/18 1052  TempSrc:   PainSc: [name]

## 2018-08-05 ENCOUNTER — Encounter (HOSPITAL_COMMUNITY): Payer: Self-pay | Admitting: Orthopedic Surgery

## 2018-08-06 DIAGNOSIS — Z96651 Presence of right artificial knee joint: Secondary | ICD-10-CM | POA: Diagnosis not present

## 2018-08-06 DIAGNOSIS — I712 Thoracic aortic aneurysm, without rupture: Secondary | ICD-10-CM | POA: Diagnosis not present

## 2018-08-06 DIAGNOSIS — I10 Essential (primary) hypertension: Secondary | ICD-10-CM | POA: Diagnosis not present

## 2018-08-06 DIAGNOSIS — I480 Paroxysmal atrial fibrillation: Secondary | ICD-10-CM | POA: Diagnosis not present

## 2018-08-06 DIAGNOSIS — Z471 Aftercare following joint replacement surgery: Secondary | ICD-10-CM | POA: Diagnosis not present

## 2018-08-06 DIAGNOSIS — Z7901 Long term (current) use of anticoagulants: Secondary | ICD-10-CM | POA: Diagnosis not present

## 2018-08-08 ENCOUNTER — Ambulatory Visit (INDEPENDENT_AMBULATORY_CARE_PROVIDER_SITE_OTHER): Payer: Medicare HMO | Admitting: Family Medicine

## 2018-08-08 ENCOUNTER — Telehealth: Payer: Self-pay | Admitting: Family Medicine

## 2018-08-08 VITALS — BP 107/74 | HR 93 | Temp 98.1°F

## 2018-08-08 DIAGNOSIS — I712 Thoracic aortic aneurysm, without rupture: Secondary | ICD-10-CM | POA: Diagnosis not present

## 2018-08-08 DIAGNOSIS — I4891 Unspecified atrial fibrillation: Secondary | ICD-10-CM

## 2018-08-08 DIAGNOSIS — Z471 Aftercare following joint replacement surgery: Secondary | ICD-10-CM | POA: Diagnosis not present

## 2018-08-08 DIAGNOSIS — Z23 Encounter for immunization: Secondary | ICD-10-CM

## 2018-08-08 DIAGNOSIS — I10 Essential (primary) hypertension: Secondary | ICD-10-CM | POA: Diagnosis not present

## 2018-08-08 DIAGNOSIS — Z7901 Long term (current) use of anticoagulants: Secondary | ICD-10-CM | POA: Diagnosis not present

## 2018-08-08 DIAGNOSIS — I480 Paroxysmal atrial fibrillation: Secondary | ICD-10-CM | POA: Diagnosis not present

## 2018-08-08 DIAGNOSIS — Z96651 Presence of right artificial knee joint: Secondary | ICD-10-CM | POA: Diagnosis not present

## 2018-08-08 LAB — POCT INR: INR: 2.2 (ref 2.0–3.0)

## 2018-08-08 MED ORDER — ALBUTEROL SULFATE 108 (90 BASE) MCG/ACT IN AEPB: 2.0000 | INHALATION_SPRAY | Freq: Four times a day (QID) | RESPIRATORY_TRACT | 1 refills | Status: DC | PRN

## 2018-08-08 NOTE — Telephone Encounter (Signed)
Pt advised.    INR appt scheduled.

## 2018-08-08 NOTE — Telephone Encounter (Signed)
Drug rep returned call, they no longer provide any samples of Proair. The rx is about $24 at [facility], routing to PCP to see if Rx can be sent.

## 2018-08-08 NOTE — Telephone Encounter (Signed)
New prescription sent to [facility] for ProAir Respiclick.

## 2018-08-08 NOTE — Telephone Encounter (Signed)
HFA or respiclick? Which one?

## 2018-08-08 NOTE — Telephone Encounter (Signed)
respiclick is what he had. He uses it PRN for SOB.

## 2018-08-08 NOTE — Telephone Encounter (Signed)
Left VM for drug rep (Amy: [phone]) to see if Pt can get another Proair respiclick inhaler sample.

## 2018-08-11 ENCOUNTER — Other Ambulatory Visit: Payer: Self-pay | Admitting: Family Medicine

## 2018-08-11 DIAGNOSIS — M25561 Pain in right knee: Secondary | ICD-10-CM | POA: Diagnosis not present

## 2018-08-11 MED ORDER — ALBUTEROL SULFATE HFA 108 (90 BASE) MCG/ACT IN AERS: 1.0000 | INHALATION_SPRAY | Freq: Four times a day (QID) | RESPIRATORY_TRACT | 2 refills | Status: DC | PRN

## 2018-08-11 NOTE — Progress Notes (Signed)
Pro-air not covered on patient's insurance requested a prescription for Ventolin.  New prescription sent to pharmacy.

## 2018-08-12 DIAGNOSIS — I712 Thoracic aortic aneurysm, without rupture: Secondary | ICD-10-CM | POA: Diagnosis not present

## 2018-08-12 DIAGNOSIS — I10 Essential (primary) hypertension: Secondary | ICD-10-CM | POA: Diagnosis not present

## 2018-08-12 DIAGNOSIS — I480 Paroxysmal atrial fibrillation: Secondary | ICD-10-CM | POA: Diagnosis not present

## 2018-08-12 DIAGNOSIS — Z7901 Long term (current) use of anticoagulants: Secondary | ICD-10-CM | POA: Diagnosis not present

## 2018-08-12 DIAGNOSIS — Z96651 Presence of right artificial knee joint: Secondary | ICD-10-CM | POA: Diagnosis not present

## 2018-08-12 DIAGNOSIS — Z471 Aftercare following joint replacement surgery: Secondary | ICD-10-CM | POA: Diagnosis not present

## 2018-08-14 DIAGNOSIS — Z96651 Presence of right artificial knee joint: Secondary | ICD-10-CM | POA: Diagnosis not present

## 2018-08-14 DIAGNOSIS — I480 Paroxysmal atrial fibrillation: Secondary | ICD-10-CM | POA: Diagnosis not present

## 2018-08-14 DIAGNOSIS — Z7901 Long term (current) use of anticoagulants: Secondary | ICD-10-CM | POA: Diagnosis not present

## 2018-08-14 DIAGNOSIS — I10 Essential (primary) hypertension: Secondary | ICD-10-CM | POA: Diagnosis not present

## 2018-08-14 DIAGNOSIS — I712 Thoracic aortic aneurysm, without rupture: Secondary | ICD-10-CM | POA: Diagnosis not present

## 2018-08-14 DIAGNOSIS — Z471 Aftercare following joint replacement surgery: Secondary | ICD-10-CM | POA: Diagnosis not present

## 2018-08-20 ENCOUNTER — Ambulatory Visit (INDEPENDENT_AMBULATORY_CARE_PROVIDER_SITE_OTHER): Payer: Medicare HMO | Admitting: Physical Therapy

## 2018-08-20 ENCOUNTER — Encounter: Payer: Self-pay | Admitting: Physical Therapy

## 2018-08-20 ENCOUNTER — Telehealth: Payer: Self-pay | Admitting: Physical Therapy

## 2018-08-20 VITALS — BP 79/55

## 2018-08-20 DIAGNOSIS — R2689 Other abnormalities of gait and mobility: Secondary | ICD-10-CM | POA: Diagnosis not present

## 2018-08-20 DIAGNOSIS — M25661 Stiffness of right knee, not elsewhere classified: Secondary | ICD-10-CM

## 2018-08-20 DIAGNOSIS — M25561 Pain in right knee: Secondary | ICD-10-CM

## 2018-08-20 NOTE — Telephone Encounter (Signed)
Pt advised of recommendations. He stated that he took his bp about 1 hour ago and it was 113/77. Advised that Dr. Madilyn Fireman would reevaluate this when he comes in on Friday. He voiced understanding and agreed.Elouise Munroe, [place]

## 2018-08-20 NOTE — Therapy (Signed)
[place], [place], [address] Phone: [phone]   Fax:  [phone]  Physical Therapy Evaluation  Patient Details  Name: Jerome Hicks MRN: [id number] Date of Birth: Nov 03, 1951 Referring Provider: Dorna Leitz, MD   Encounter Date: 08/20/2018  PT End of Session - 08/20/18 1217    Visit Number  1    Number of Visits  16    Date for PT Re-Evaluation  10/15/18    Authorization Type  [employer]    PT Start Time  1115    PT Stop Time  1206    PT Time Calculation (min)  51 min    Activity Tolerance  Patient tolerated treatment well;Treatment limited secondary to medical complications (Comment)   lightheadedness   Behavior During Therapy  [facility] for tasks assessed/performed       Past Medical History:  Diagnosis Date  . Arthritis    oa  . 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 Ascending aortic aneurysm ([place])    seen on 01-31-18 ct chest ; patient denies awareness  . Atrial fibrillation ([place])   . Cataracts, bilateral   . ED (erectile dysfunction)    has been prescribed Levitra and Viagrain in the past  . Hypertension   . Internal hemorrhoids   . Kidney stones   . Leg edema    recurrent cellulitis  . Scrotal abscess 3-03  . Shoulder arthritis   . Sigmoid diverticulosis     Past Surgical History:  Procedure Laterality Date  .  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 CHOLECYSTECTOMY  1971  . kidney stones  1990's   Ureteroscopic stone extraction  . TONSILLECTOMY    . 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   Procedure: RIGHT TOTAL KNEE ARTHROPLASTY;  Surgeon: Dorna Leitz, MD;  Location: [facility]S;  Service: Orthopedics;  Laterality: Right;    Vitals:   08/20/18 1116  BP: (!) 79/55     Subjective Assessment - 08/20/18 1119    Subjective  Pt is a 67 y/o male who presents to OPPT s/p Rt TKA on 08/01/18.  Pt presents today with continued difficulty with mobility  (using RW) and decreased ROM.  Of note, pt lightheaded today.    Limitations  Standing;Walking    Patient Stated Goals  improve mobility    Currently in Pain?  Yes    Pain Score  3     Pain Location  Knee    Pain Orientation  Right    Pain Descriptors / Indicators  Aching    Pain Type  Surgical pain    Pain Onset  1 to 4 weeks ago    Pain Frequency  Constant    Aggravating Factors   bending, weight bearing    Pain Relieving Factors  medication, ice         [facility] PT Assessment - 08/20/18 1122      Assessment   Medical Diagnosis  Rt TKA    Referring Provider  Dorna Leitz, MD    Onset Date/Surgical Date  08/01/18    Next MD Visit  09/01/18    Prior Therapy  HHPT x 4 visits      Precautions   Precautions  Knee      Restrictions   Weight Bearing Restrictions  No      Balance Screen   Has the patient fallen in the past 6 months  No    Has the  patient had a decrease in activity level because of a fear of falling?   No    Is the patient reluctant to leave their home because of a fear of falling?   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, Development worker, community, mow grass      Cognition   Overall Cognitive Status  Within Functional Limits for tasks assessed      Observation/Other Assessments   Focus on Therapeutic Outcomes (FOTO)   43 (57% limited; predicted 39% limited)      ROM / Strength   AROM / PROM / Strength  AROM;PROM      AROM   AROM Assessment Site  Knee    Right/Left Knee  Right    Right Knee Extension  -10    Right Knee Flexion  92      PROM   PROM Assessment Site  Knee    Right/Left Knee  Right    Right  Knee Extension  -4    Right Knee Flexion  100      Ambulation/Gait   Ambulation/Gait  Yes    Ambulation/Gait Assistance  5: Supervision    Ambulation Distance (Feet)  30 Feet    Assistive device  Rolling walker    Gait Pattern  Antalgic                Objective measurements completed on examination: See above findings.      [facility] Adult PT Treatment/Exercise - 08/20/18 1122      Modalities   Modalities  Vasopneumatic      Vasopneumatic   Number Minutes Vasopneumatic   15 minutes    Vasopnuematic Location   Knee    Vasopneumatic Pressure  Low    Vasopneumatic Temperature   max cold             PT Education - 08/20/18 1216    Education Details  continue HEP from HHPT; pt to bring next visit to update    Person(s) Educated  Patient;Spouse    Methods  Explanation    Comprehension  Verbalized understanding       PT Short Term Goals - 08/20/18 1223      PT SHORT TERM GOAL #1   Title  improve Rt knee AROM 0-100 for improved function    Status  New    Target Date  09/17/18      PT SHORT TERM GOAL #2   Title  amb > 150' with SPC modified independent for improved function    Status  New    Target Date  09/17/18        PT Long Term Goals - 08/20/18 1223      PT LONG TERM GOAL #1   Title  I with HEP    Status  New    Target Date  10/15/18      PT LONG TERM GOAL #2   Title  improve Rt knee AROM 0-110 for improved function and mobility  Status  New    Target Date  10/15/18      PT LONG TERM GOAL #3   Title  amb > 300' without device independently for improved functional mobility    Status  New    Target Date  10/15/18      PT LONG TERM GOAL #4   Title  improve FOTO to </= 40% limitation for improved function    Status  New    Target Date  10/15/18      PT LONG TERM GOAL #5   Title  report pain < 2/10 with mobility    Status  New    Target Date  10/15/18             Plan - 08/20/18 1218    Clinical Impression Statement  Pt is a  67 y/o male who presents to OPPT s/p Rt TKA on 08/01/18.  Pt demonstrates gait abnormalities and decreased strength and ROM.  Pt will benefit from PT to address deficits listed.  Pt arrived today with c/o lightheadedness.  BP assessed and pt's BP 79/55.  After lying supine with feet elevated BP increased to 90/66 and symptoms improved.  Pt became lightheaded again upon sitting so w/c utilized to assist pt to car.  Message sent to pt's PCP to assist with medication management.    History and Personal Factors relevant to plan of care:  HTN    Clinical Presentation  Evolving    Clinical Presentation due to:  increased lightheadedness, hypotensive symptoms    Clinical Decision Making  Low    Rehab Potential  Good    PT Frequency  2x / week    PT Duration  8 weeks    PT Treatment/Interventions  ADLs/Self Care Home Management;Cryotherapy;Electrical Stimulation;Ultrasound;Moist Heat;Gait training;Stair training;Functional mobility training;Therapeutic activities;Therapeutic exercise;Balance training;Neuromuscular re-education;Patient/family education;Manual techniques;Passive range of motion;Dry needling;Vasopneumatic Device;Taping    PT Next Visit Plan  review HH HEP and update PRN; manual/modalities PRN for pain, monitor BP symptoms    Consulted and Agree with Plan of Care  Patient       Patient will benefit from skilled therapeutic intervention in order to improve the following deficits and impairments:  Abnormal gait, Increased fascial restricitons, Increased muscle spasms, Pain, Impaired flexibility, Decreased range of motion, Difficulty walking, Decreased balance, Decreased activity tolerance, Decreased mobility, Decreased strength  Visit Diagnosis: Acute pain of right knee - Plan: PT plan of care cert/re-cert  Stiffness of right knee, not elsewhere classified - Plan: PT plan of care cert/re-cert  Other abnormalities of gait and mobility - Plan: PT plan of care cert/re-cert     Problem  List Patient Active Problem List   Diagnosis Date Noted  . Total knee replacement status 08/02/2018  . 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 Primary osteoarthritis of right knee 10/21/2017  . BMI 37.0-37.9, adult 04/27/2016  . Glenohumeral arthritis of both shoulders 04/14/2015  . 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 Hypogonadism male 02/26/2014  . Lower leg edema 12/02/2013  . Lymph node enlargement 12/02/2013  . CKD (chronic kidney disease) stage 3, GFR 30-59 ml/min (HCC) 05/28/2013  . Atrial fibrillation ([place]) 06/25/2012  . 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 RESTRICTIVE LUNG DISEASE 01/12/2011  . ERECTILE DYSFUNCTION 01/06/2008  . ESSENTIAL HYPERTENSION, BENIGN 01/05/2008  . Venous (peripheral) insufficiency 01/05/2008      Laureen Abrahams, PT, DPT 08/20/18 12:40 PM     [facility] [place], [place], [address] Phone:  [phone]   Fax:  [phone]  Name: Jerome Hicks MRN: [id number] Date of Birth: 1951-02-15

## 2018-08-20 NOTE — Telephone Encounter (Signed)
If his blood pressure is low then just have him hold his Lasix and not take it.  The other thing we can take a look at his decreasing his lisinopril from 10 mg down to 5 mg.  We can always ask him to cut it in half.

## 2018-08-20 NOTE — Telephone Encounter (Signed)
Dr. Madilyn Fireman-  I just evaluated Mr. Mcclaren following his Rt TKA on 08/01/18.  He arrived today very lightheaded and his BP initially was 79/55.  After lying down with his feet elevated for approx 20 min it went back up to 90/66.  He's only taking Tramadol for pain, and he's still taking his BP meds as prescribed.    Do you have any giudelines for when he should hold his BP meds?  Any input is greatly appreciated.  Thanks- Laureen Abrahams, PT, DPT 08/20/18 11:39 AM

## 2018-08-21 ENCOUNTER — Ambulatory Visit: Payer: Medicare HMO | Admitting: Physical Therapy

## 2018-08-21 VITALS — BP 124/73 | HR 87

## 2018-08-21 DIAGNOSIS — M25561 Pain in right knee: Secondary | ICD-10-CM

## 2018-08-21 DIAGNOSIS — M25661 Stiffness of right knee, not elsewhere classified: Secondary | ICD-10-CM | POA: Diagnosis not present

## 2018-08-21 DIAGNOSIS — R2689 Other abnormalities of gait and mobility: Secondary | ICD-10-CM | POA: Diagnosis not present

## 2018-08-21 NOTE — Therapy (Signed)
[place], [place], [address] Phone: [phone]   Fax:  [phone]  Physical Therapy Treatment  Patient Details  Name: Jerome Hicks MRN: [id number] Date of Birth: 1951/03/16 Referring Provider: Dorna Leitz, MD   Encounter Date: 08/21/2018  PT End of Session - 08/21/18 0940    Visit Number  2    Number of Visits  16    Date for PT Re-Evaluation  10/15/18    Authorization Type  [employer]    PT Start Time  0935    PT Stop Time  1028    PT Time Calculation (min)  53 min    Activity Tolerance  Patient tolerated treatment well    Behavior During Therapy  [facility] for tasks assessed/performed       Past Medical History:  Diagnosis Date  . Arthritis    oa  . 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 Ascending aortic aneurysm ([place])    seen on 01-31-18 ct chest ; patient denies awareness  . Atrial fibrillation ([place])   . Cataracts, bilateral   . ED (erectile dysfunction)    has been prescribed Levitra and Viagrain in the past  . Hypertension   . Internal hemorrhoids   . Kidney stones   . Leg edema    recurrent cellulitis  . Scrotal abscess 3-03  . Shoulder arthritis   . Sigmoid diverticulosis     Past Surgical History:  Procedure Laterality Date  .  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 CHOLECYSTECTOMY  1971  . kidney stones  1990's   Ureteroscopic stone extraction  . TONSILLECTOMY    . 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   Procedure: RIGHT TOTAL KNEE ARTHROPLASTY;  Surgeon: Dorna Leitz, MD;  Location: [facility];  Service: Orthopedics;  Laterality: Right;    Vitals:   08/21/18 0940  BP: 124/73  Pulse: 87    Subjective Assessment - 08/21/18 0943    Subjective  Pt reports his BP is much better today. He is not taking any pain meds.  Per wife, Dr. Madilyn Fireman is having pt hold fluid pill and cut his BP med in half.  He has not been light headed since yesterday.    He is using his cane occasionally in the house, but otherwise he uses his RW.   Patient is accompained by:  Family member   wife, Becky   Patient Stated Goals  improve mobility    Currently in Pain?  No/denies    Pain Score  0-No pain         [facility] PT Assessment - 08/21/18 0001      Assessment   Medical Diagnosis  Rt TKA    Referring Provider  Dorna Leitz, MD    Onset Date/Surgical Date  08/01/18      AROM   Right Knee Flexion  101      PROM   Right Knee Flexion  105   AAROM with heel slide       [facility] Adult PT Treatment/Exercise - 08/21/18 0001      Exercises   Exercises  Knee/Hip      Knee/Hip Exercises: Stretches   Passive Hamstring Stretch  Right;30 seconds;2 reps   supine with strap, overpressure from hand   Passive Hamstring Stretch Limitations  3rd rep seated with straight back x 30     Gastroc Stretch  Right;2 reps;30 seconds    Gastroc Stretch Limitations  pt lightheaded afterwards. pt reports he may have held his breath.       Knee/Hip Exercises: Aerobic   Stationary Bike  partial revolutions for ~7 min for Rt knee ROM      Knee/Hip Exercises: Seated   Long Arc Quad  Right;1 set;10 reps   cues for increased DF    Other Seated Knee/Hip Exercises  seated scoot x 10 reps       Knee/Hip Exercises: Supine   Quad Sets  Strengthening;Right;1 set;10 reps   5 sec holds   Heel Slides  Right;1 set;10 reps;AAROM   5 sec holds   Straight Leg Raises  Strengthening;Right;1 set;10 reps      Modalities   Modalities  Vasopneumatic      Vasopneumatic   Number Minutes Vasopneumatic   15 minutes    Vasopnuematic Location   Knee    Vasopneumatic Pressure  Low    Vasopneumatic Temperature   34 deg      Reviewed HEP from HHPT, and made edits to paper.           PT Short Term Goals - 08/20/18 1223      PT SHORT TERM GOAL #1   Title  improve Rt knee AROM 0-100 for improved function    Status  New    Target Date  09/17/18      PT SHORT TERM GOAL #2    Title  amb > 150' with SPC modified independent for improved function    Status  New    Target Date  09/17/18        PT Long Term Goals - 08/20/18 1223      PT LONG TERM GOAL #1   Title  I with HEP    Status  New    Target Date  10/15/18      PT LONG TERM GOAL #2   Title  improve Rt knee AROM 0-110 for improved function and mobility    Status  New    Target Date  10/15/18      PT LONG TERM GOAL #3   Title  amb > 300' without device independently for improved functional mobility    Status  New    Target Date  10/15/18      PT LONG TERM GOAL #4   Title  improve FOTO to </= 40% limitation for improved function    Status  New    Target Date  10/15/18      PT LONG TERM GOAL #5   Title  report pain < 2/10 with mobility    Status  New    Target Date  10/15/18            Plan - 08/21/18 1019    Clinical Impression Statement  Pt's BP WNL during today's treatment.  He had one occasion of light headedness after standing calf stretch; improved with seated rest.  Encouraged pt to count aloud as to not hold his breath.Pt demonstrated improved Rt knee fleixon ROM.  Pt reported increase in knee pain to 4-5/10; reduced with rest and vaso at end of session.      Rehab Potential  Good    PT Frequency  2x / week    PT Duration  8 weeks    PT Treatment/Interventions  ADLs/Self Care Home Management;Cryotherapy;Electrical Stimulation;Ultrasound;Moist Heat;Gait training;Stair training;Functional mobility training;Therapeutic activities;Therapeutic exercise;Balance training;Neuromuscular re-education;Patient/family education;Manual techniques;Passive range of motion;Dry needling;Vasopneumatic Device;Taping    PT Next Visit Plan  continue progressive Rt knee  ROM (with more focus on extension) and LE strengthening.  Stairs.  monitor BP.      PT Home Exercise Plan  [id number]    Consulted and Agree with Plan of Care  Patient;Family member/caregiver    Family Member Consulted  wife        Patient will benefit from skilled therapeutic intervention in order to improve the following deficits and impairments:  Abnormal gait, Increased fascial restricitons, Increased muscle spasms, Pain, Impaired flexibility, Decreased range of motion, Difficulty walking, Decreased balance, Decreased activity tolerance, Decreased mobility, Decreased strength  Visit Diagnosis: Acute pain of right knee  Stiffness of right knee, not elsewhere classified  Other abnormalities of gait and mobility     Problem List Patient Active Problem List   Diagnosis Date Noted  . Total knee replacement status 08/02/2018  . 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 Primary osteoarthritis of right knee 10/21/2017  . BMI 37.0-37.9, adult 04/27/2016  . Glenohumeral arthritis of both shoulders 04/14/2015  . 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 Hypogonadism male 02/26/2014  . Lower leg edema 12/02/2013  . Lymph node enlargement 12/02/2013  . CKD (chronic kidney disease) stage 3, GFR 30-59 ml/min (HCC) 05/28/2013  . Atrial fibrillation ([place]) 06/25/2012  . 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 RESTRICTIVE LUNG DISEASE 01/12/2011  . ERECTILE DYSFUNCTION 01/06/2008  . ESSENTIAL HYPERTENSION, BENIGN 01/05/2008  . Venous (peripheral) insufficiency 01/05/2008   Kerin Perna, PTA 08/21/18 11:49 AM  [facility] [place], [place], [address] Phone: [phone]   Fax:  [phone]  Name: ABDALLAH HERN MRN: [id number] Date of Birth: 08-Nov-1951

## 2018-08-22 ENCOUNTER — Ambulatory Visit (INDEPENDENT_AMBULATORY_CARE_PROVIDER_SITE_OTHER): Payer: Medicare HMO | Admitting: Physician Assistant

## 2018-08-22 VITALS — BP 118/61 | HR 95 | Wt 279.0 lb

## 2018-08-22 DIAGNOSIS — R82998 Other abnormal findings in urine: Secondary | ICD-10-CM

## 2018-08-22 DIAGNOSIS — I4891 Unspecified atrial fibrillation: Secondary | ICD-10-CM

## 2018-08-22 DIAGNOSIS — R3912 Poor urinary stream: Secondary | ICD-10-CM

## 2018-08-22 LAB — POCT URINALYSIS DIPSTICK
Blood, UA: NEGATIVE
Glucose, UA: NEGATIVE
LEUKOCYTES UA: NEGATIVE
NITRITE UA: NEGATIVE
PH UA: 5 (ref 5.0–8.0)
PROTEIN UA: POSITIVE — AB
Spec Grav, UA: >=1.03 — AB (ref 1.010–1.025)
Urobilinogen, UA: 1 E.U./dL

## 2018-08-22 LAB — POCT INR: INR: 2.9 (ref 2.0–3.0)

## 2018-08-22 NOTE — Progress Notes (Signed)
Pt here for INR check no missed doses, diet changes,bruising,bleeding,CP,SOB.  Pt has been holding his lasix and taking half of his lisinopril due to low blood pressures. He has been doing well on this regimen and his BP today in the office was WNL.  Pt was c/o urinary sxs that began this wk. He feels that his stream has been slow and he said that at the end he experiences a burning sensation. He also reports that his urine is dark in color. I will have him give a sample prior to leaving today for testing. He would like to stop the Tramadol because he feels that this has brought this on. His wife stated that she doesn't believe that this has anything to do with his sxs since he has been on this for a while.   Maryruth Eve, Lahoma Crocker, CMA   .Marland Kitchen Results for orders placed or performed in visit on 08/22/18  POCT INR  Result Value Ref Range   INR 2.9 2.0 - 3.0  POCT urinalysis dipstick  Result Value Ref Range   Color, UA Dark Yellow    Clarity, UA Clear    Glucose, UA Negative Negative   Bilirubin, UA Small    Ketones, UA Trace    Spec Grav, UA >=1.030 (A) 1.010 - 1.025   Blood, UA Negative    pH, UA 5.0 5.0 - 8.0   Protein, UA Positive (A) Negative   Urobilinogen, UA 1.0 0.2 or 1.0 E.U./dL   Nitrite, UA Negative    Leukocytes, UA Negative Negative   Appearance Clear    Odor none    Positive for protein. Will culture. Recheck 1-2 weeks to make sure protein has resolved. Bmp was done in 07/2018. May recheck if symptoms do not improve and no infection. Stay hydrated. Call if develop a fever, chills, abdominal or flank pain.   Stay on same dose INR 2 weeks.  Iran Planas PA-C

## 2018-08-22 NOTE — Progress Notes (Signed)
No signs of infection but did have some protein which do need to monitor either with recehck etc. Cultured urine today to confirm no infection. If positive will start antibiotic. Either way need to recheck urine in 1-2 weeks.

## 2018-08-23 LAB — URINE CULTURE
MICRO NUMBER: 91136086
Result:: NO GROWTH
SPECIMEN QUALITY: ADEQUATE

## 2018-08-24 NOTE — Progress Notes (Signed)
Call pt: confirm no UTI. No bacterial growth on culture. How are symptoms today?

## 2018-08-25 ENCOUNTER — Ambulatory Visit: Payer: Medicare HMO | Admitting: Physical Therapy

## 2018-08-25 ENCOUNTER — Encounter: Payer: Self-pay | Admitting: Physical Therapy

## 2018-08-25 DIAGNOSIS — M25661 Stiffness of right knee, not elsewhere classified: Secondary | ICD-10-CM

## 2018-08-25 DIAGNOSIS — M25561 Pain in right knee: Secondary | ICD-10-CM

## 2018-08-25 DIAGNOSIS — R2689 Other abnormalities of gait and mobility: Secondary | ICD-10-CM

## 2018-08-25 NOTE — Therapy (Signed)
[place], [place], [address] Phone: [phone]   Fax:  [phone]  Physical Therapy Treatment  Patient Details  Name: Jerome Hicks MRN: [id number] Date of Birth: [birth year] Referring Provider: Dorna Leitz, MD   Encounter Date: 08/25/2018  PT End of Session - 08/25/18 1014    Visit Number  3    Number of Visits  16    Date for PT Re-Evaluation  10/15/18    Authorization Type  [employer]    PT Start Time  0930    PT Stop Time  1020    PT Time Calculation (min)  50 min    Activity Tolerance  Patient tolerated treatment well    Behavior During Therapy  [facility] for tasks assessed/performed       Past Medical History:  Diagnosis Date  . Arthritis    oa  . 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 Ascending aortic aneurysm ([place])    seen on 01-31-18 ct chest ; patient denies awareness  . Atrial fibrillation ([place])   . Cataracts, bilateral   . ED (erectile dysfunction)    has been prescribed Levitra and Viagrain in the past  . Hypertension   . Internal hemorrhoids   . Kidney stones   . Leg edema    recurrent cellulitis  . Scrotal abscess 3-03  . Shoulder arthritis   . Sigmoid diverticulosis     Past Surgical History:  Procedure Laterality Date  .  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 CHOLECYSTECTOMY  1971  . kidney stones  1990's   Ureteroscopic stone extraction  . TONSILLECTOMY    . 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   Procedure: RIGHT TOTAL KNEE ARTHROPLASTY;  Surgeon: Dorna Leitz, MD;  [facility];  Service: Orthopedics;  Laterality: Right;    There were no vitals filed for this visit.  Subjective Assessment - 08/25/18 0933    Subjective  feeling much better; doing well.  using cane today    Patient is accompained by:  Family member   wife, Becky   Patient Stated Goals  improve mobility    Currently in Pain?  Yes    Pain Score  2     Pain  Orientation  Right    Pain Descriptors / Indicators  Aching    Pain Type  Surgical pain    Pain Onset  1 to 4 weeks ago    Pain Frequency  Constant    Aggravating Factors   bending, weight bearing    Pain Relieving Factors  medication, ice                       [facility] Adult PT Treatment/Exercise - 08/25/18 0934      Exercises   Exercises  Knee/Hip      Knee/Hip Exercises: Aerobic   Stationary Bike  partial revolutions for ~8 min for Rt knee ROM      Knee/Hip Exercises: Standing   Heel Raises  Both;20 reps    Hip Abduction  Both;10 reps;Knee straight    Hip Extension  Both;10 reps;Knee straight      Knee/Hip Exercises: Seated   Long Arc Quad  Right;10 reps;2 sets   cues for increased DF    Long Arc Quad Weight  2 lbs.    Other Seated Knee/Hip Exercises  seated scoot x 5 reps       Knee/Hip Exercises:  Supine   Heel Slides  Right;1 set;10 reps;AAROM   5 sec holds, with strap   Straight Leg Raises  Strengthening;Right;1 set;10 reps      Vasopneumatic   Number Minutes Vasopneumatic   10 minutes    Vasopnuematic Location   Knee    Vasopneumatic Pressure  Medium    Vasopneumatic Temperature   34 deg      Manual Therapy   Manual Therapy  Passive ROM    Passive ROM  flexion and extension with end range holds; Rt knee               PT Short Term Goals - 08/20/18 1223      PT SHORT TERM GOAL #1   Title  improve Rt knee AROM 0-100 for improved function    Status  New    Target Date  09/17/18      PT SHORT TERM GOAL #2   Title  amb > 150' with SPC modified independent for improved function    Status  New    Target Date  09/17/18        PT Long Term Goals - 08/20/18 1223      PT LONG TERM GOAL #1   Title  I with HEP    Status  New    Target Date  10/15/18      PT LONG TERM GOAL #2   Title  improve Rt knee AROM 0-110 for improved function and mobility    Status  New    Target Date  10/15/18      PT LONG TERM GOAL #3   Title  amb > 300'  without device independently for improved functional mobility    Status  New    Target Date  10/15/18      PT LONG TERM GOAL #4   Title  improve FOTO to </= 40% limitation for improved function    Status  New    Target Date  10/15/18      PT LONG TERM GOAL #5   Title  report pain < 2/10 with mobility    Status  New    Target Date  10/15/18            Plan - 08/25/18 1014    Clinical Impression Statement  Pt tolerated session well today and able to make full revolutions on bike as well as progress to [facility] for ambulation. Overall progressing well with PT.      Rehab Potential  Good    PT Frequency  2x / week    PT Duration  8 weeks    PT Treatment/Interventions  ADLs/Self Care Home Management;Cryotherapy;Electrical Stimulation;Ultrasound;Moist Heat;Gait training;Stair training;Functional mobility training;Therapeutic activities;Therapeutic exercise;Balance training;Neuromuscular re-education;Patient/family education;Manual techniques;Passive range of motion;Dry needling;Vasopneumatic Device;Taping    PT Next Visit Plan  continue progressive Rt knee ROM (with more focus on extension) and LE strengthening.  Stairs.  monitor BP.  measure ROM next visit    PT Home Exercise Plan  [id number]    Consulted and Agree with Plan of Care  Patient;Family member/caregiver    Family Member Consulted  wife       Patient will benefit from skilled therapeutic intervention in order to improve the following deficits and impairments:  Abnormal gait, Increased fascial restricitons, Increased muscle spasms, Pain, Impaired flexibility, Decreased range of motion, Difficulty walking, Decreased balance, Decreased activity tolerance, Decreased mobility, Decreased strength  Visit Diagnosis: Acute pain of right knee  Stiffness of right knee, not elsewhere  classified  Other abnormalities of gait and mobility     Problem List Patient Active Problem List   Diagnosis Date Noted  . Total knee replacement  status 08/02/2018  . 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 Primary osteoarthritis of right knee 10/21/2017  . BMI 37.0-37.9, adult 04/27/2016  . Glenohumeral arthritis of both shoulders 04/14/2015  . 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 Hypogonadism male 02/26/2014  . Lower leg edema 12/02/2013  . Lymph node enlargement 12/02/2013  . CKD (chronic kidney disease) stage 3, GFR 30-59 ml/min (HCC) 05/28/2013  . Atrial fibrillation ([place]) 06/25/2012  . 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 RESTRICTIVE LUNG DISEASE 01/12/2011  . ERECTILE DYSFUNCTION 01/06/2008  . ESSENTIAL HYPERTENSION, BENIGN 01/05/2008  . Venous (peripheral) insufficiency 01/05/2008      Laureen Abrahams, PT, DPT 08/25/18 10:16 AM    [facility] [place], [place], [address] Phone: [phone]   Fax:  [phone]  Name: Jerome Hicks MRN: [id number] Date of Birth: [birth year]

## 2018-08-27 ENCOUNTER — Ambulatory Visit: Payer: Medicare HMO | Admitting: Physical Therapy

## 2018-08-27 ENCOUNTER — Encounter: Payer: Self-pay | Admitting: Physical Therapy

## 2018-08-27 VITALS — BP 129/82

## 2018-08-27 DIAGNOSIS — M25561 Pain in right knee: Secondary | ICD-10-CM | POA: Diagnosis not present

## 2018-08-27 DIAGNOSIS — M25661 Stiffness of right knee, not elsewhere classified: Secondary | ICD-10-CM | POA: Diagnosis not present

## 2018-08-27 DIAGNOSIS — R2689 Other abnormalities of gait and mobility: Secondary | ICD-10-CM | POA: Diagnosis not present

## 2018-08-27 NOTE — Therapy (Signed)
[place], [place], [address] Phone: [phone]   Fax:  [phone]  Physical Therapy Treatment  Patient Details  Name: Jerome Hicks MRN: [id number] Date of Birth: February 04, 1951 Referring Provider: Dorna Leitz, MD   Encounter Date: 08/27/2018  PT End of Session - 08/27/18 1012    Visit Number  4    Number of Visits  16    Date for PT Re-Evaluation  10/15/18    Authorization Type  [employer]    PT Start Time  0925    PT Stop Time  1015    PT Time Calculation (min)  50 min    Activity Tolerance  Patient tolerated treatment well    Behavior During Therapy  [facility] for tasks assessed/performed       Past Medical History:  Diagnosis Date  . Arthritis    oa  . 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 Ascending aortic aneurysm ([place])    seen on 01-31-18 ct chest ; patient denies awareness  . Atrial fibrillation ([place])   . Cataracts, bilateral   . ED (erectile dysfunction)    has been prescribed Levitra and Viagrain in the past  . Hypertension   . Internal hemorrhoids   . Kidney stones   . Leg edema    recurrent cellulitis  . Scrotal abscess 3-03  . Shoulder arthritis   . Sigmoid diverticulosis     Past Surgical History:  Procedure Laterality Date  .  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 CHOLECYSTECTOMY  1971  . kidney stones  1990's   Ureteroscopic stone extraction  . TONSILLECTOMY    . 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   Procedure: RIGHT TOTAL KNEE ARTHROPLASTY;  Surgeon: Dorna Leitz, MD;  [facility];  Service: Orthopedics;  Laterality: Right;    Vitals:   08/27/18 0929  BP: 129/82    Subjective Assessment - 08/27/18 0930    Subjective  knee doing well; a little dizzy today (BP checked and WNL)    Patient is accompained by:  Family member   wife, Becky   Patient Stated Goals  improve mobility    Currently in Pain?  Yes    Pain Score  2     Pain Location  Knee    Pain Orientation  Right    Pain Descriptors / Indicators  Aching    Pain Type  Surgical pain    Pain Onset  1 to 4 weeks ago    Pain Frequency  Constant    Aggravating Factors   bending, weight bearing    Pain Relieving Factors  medication, ice         [facility] PT Assessment - 08/27/18 0938      Assessment   Medical Diagnosis  Rt TKA    Referring Provider  Dorna Leitz, MD    Onset Date/Surgical Date  08/01/18      AROM   Right Knee Extension  -8    Right Knee Flexion  107      PROM   Right Knee Extension  -5    Right Knee Flexion  117                   [facility] Adult PT Treatment/Exercise - 08/27/18 0931      Exercises   Exercises  Knee/Hip      Knee/Hip Exercises: Aerobic   Stationary Bike  partial revolutions for ~8  min for Rt knee ROM      Knee/Hip Exercises: Standing   Heel Raises  Both;20 reps    Hip Abduction  Both;10 reps;Knee straight    Hip Extension  Both;10 reps;Knee straight    Forward Step Up  Right;10 reps;Hand Hold: 2;Step Height: 6"    Other Standing Knee Exercises  standing HS stretch on step 2 x 30 sec; standing flexion into step 2x30 sec      Knee/Hip Exercises: Supine   Short Arc Quad Sets  Right;10 reps   10 sec hold and cues for quad activation   Heel Slides  Right;1 set;10 reps;AAROM   5 sec holds, with strap     Vasopneumatic   Number Minutes Vasopneumatic   10 minutes    Vasopnuematic Location   Knee    Vasopneumatic Pressure  Medium    Vasopneumatic Temperature   34 deg      Manual Therapy   Manual Therapy  Soft tissue mobilization;Passive ROM    Soft tissue mobilization  IASTM lateral quad Rt    Passive ROM  flexion and extension with end range holds; Rt knee               PT Short Term Goals - 08/27/18 1012      PT SHORT TERM GOAL #1   Title  improve Rt knee AROM 0-100 for improved function    Baseline  9/25: 8-107    Status  Partially Met      PT SHORT TERM GOAL #2   Title  amb >  150' with SPC modified independent for improved function    Status  Achieved        PT Long Term Goals - 08/20/18 1223      PT LONG TERM GOAL #1   Title  I with HEP    Status  New    Target Date  10/15/18      PT LONG TERM GOAL #2   Title  improve Rt knee AROM 0-110 for improved function and mobility    Status  New    Target Date  10/15/18      PT LONG TERM GOAL #3   Title  amb > 300' without device independently for improved functional mobility    Status  New    Target Date  10/15/18      PT LONG TERM GOAL #4   Title  improve FOTO to </= 40% limitation for improved function    Status  New    Target Date  10/15/18      PT LONG TERM GOAL #5   Title  report pain < 2/10 with mobility    Status  New    Target Date  10/15/18            Plan - 08/27/18 1013    Clinical Impression Statement  Pt has met STG #2 and partially met STG #1.  Flexion ROM greatly improving, but continues to be limited with extension.  Ambulating modified independently with SPC, and anticipate pt can begin weaning from cane next 1-2 weeks.  Will continue to benfit from PT to maximize function.    Rehab Potential  Good    PT Frequency  2x / week    PT Duration  8 weeks    PT Treatment/Interventions  ADLs/Self Care Home Management;Cryotherapy;Electrical Stimulation;Ultrasound;Moist Heat;Gait training;Stair training;Functional mobility training;Therapeutic activities;Therapeutic exercise;Balance training;Neuromuscular re-education;Patient/family education;Manual techniques;Passive range of motion;Dry needling;Vasopneumatic Device;Taping    PT Next Visit Plan  continue progressive Rt knee ROM (with more focus on extension) and LE strengthening.  Stairs.  monitor BP.  standing balance and gait without device    PT Home Exercise Plan  [id number]    Consulted and Agree with Plan of Care  Patient;Family member/caregiver    Family Member Consulted  wife       Patient will benefit from skilled therapeutic  intervention in order to improve the following deficits and impairments:  Abnormal gait, Increased fascial restricitons, Increased muscle spasms, Pain, Impaired flexibility, Decreased range of motion, Difficulty walking, Decreased balance, Decreased activity tolerance, Decreased mobility, Decreased strength  Visit Diagnosis: Acute pain of right knee  Stiffness of right knee, not elsewhere classified  Other abnormalities of gait and mobility     Problem List Patient Active Problem List   Diagnosis Date Noted  . Total knee replacement status 08/02/2018  . 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 Primary osteoarthritis of right knee 10/21/2017  . BMI 37.0-37.9, adult 04/27/2016  . Glenohumeral arthritis of both shoulders 04/14/2015  . 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 Hypogonadism male 02/26/2014  . Lower leg edema 12/02/2013  . Lymph node enlargement 12/02/2013  . CKD (chronic kidney disease) stage 3, GFR 30-59 ml/min (HCC) 05/28/2013  . Atrial fibrillation ([place]) 06/25/2012  . 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 RESTRICTIVE LUNG DISEASE 01/12/2011  . ERECTILE DYSFUNCTION 01/06/2008  . ESSENTIAL HYPERTENSION, BENIGN 01/05/2008  . Venous (peripheral) insufficiency 01/05/2008      Laureen Abrahams, PT, DPT 08/27/18 10:15 AM    [facility] [place], [place], [address] Phone: [phone]   Fax:  [phone]  Name: Jerome Hicks MRN: [id number] Date of Birth: 1951/09/27

## 2018-08-30 ENCOUNTER — Other Ambulatory Visit: Payer: Self-pay | Admitting: Family Medicine

## 2018-09-01 ENCOUNTER — Encounter: Payer: Medicare HMO | Admitting: Physical Therapy

## 2018-09-01 ENCOUNTER — Ambulatory Visit (INDEPENDENT_AMBULATORY_CARE_PROVIDER_SITE_OTHER): Payer: Medicare HMO | Admitting: Physical Therapy

## 2018-09-01 ENCOUNTER — Encounter: Payer: Self-pay | Admitting: Physical Therapy

## 2018-09-01 DIAGNOSIS — M25661 Stiffness of right knee, not elsewhere classified: Secondary | ICD-10-CM | POA: Diagnosis not present

## 2018-09-01 DIAGNOSIS — M25561 Pain in right knee: Secondary | ICD-10-CM

## 2018-09-01 DIAGNOSIS — R2689 Other abnormalities of gait and mobility: Secondary | ICD-10-CM

## 2018-09-01 NOTE — Therapy (Signed)
[place], [place], [address] Phone: [phone]   Fax:  [phone]  Physical Therapy Treatment  Patient Details  Name: Jerome Hicks MRN: [id number] Date of Birth: August 29, 1951 Referring Provider (PT): Dorna Leitz, MD   Encounter Date: 09/01/2018  PT End of Session - 09/01/18 1524    Visit Number  5    Number of Visits  16    Date for PT Re-Evaluation  10/15/18    Authorization Type  [employer]    PT Start Time  1440    PT Stop Time  1535    PT Time Calculation (min)  55 min    Activity Tolerance  Patient tolerated treatment well    Behavior During Therapy  [facility] for tasks assessed/performed       Past Medical History:  Diagnosis Date  . Arthritis    oa  . 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 Ascending aortic aneurysm ([place])    seen on 01-31-18 ct chest ; patient denies awareness  . Atrial fibrillation ([place])   . Cataracts, bilateral   . ED (erectile dysfunction)    has been prescribed Levitra and Viagrain in the past  . Hypertension   . Internal hemorrhoids   . Kidney stones   . Leg edema    recurrent cellulitis  . Scrotal abscess 3-03  . Shoulder arthritis   . Sigmoid diverticulosis     Past Surgical History:  Procedure Laterality Date  .  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 CHOLECYSTECTOMY  1971  . kidney stones  1990's   Ureteroscopic stone extraction  . TONSILLECTOMY    . 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   Procedure: RIGHT TOTAL KNEE ARTHROPLASTY;  Surgeon: Dorna Leitz, MD;  Location: [facility];  Service: Orthopedics;  Laterality: Right;    There were no vitals filed for this visit.  Subjective Assessment - 09/01/18 1447    Subjective  MD is pleased with progress; has been walking some without the cane at home    Limitations  Standing;Walking    Patient Stated Goals  improve mobility    Pain Score  1     Pain Location  Knee    Pain  Orientation  Right    Pain Descriptors / Indicators  Aching    Pain Type  Surgical pain    Pain Onset  1 to 4 weeks ago    Pain Frequency  Constant    Aggravating Factors   bending, weight bearing    Pain Relieving Factors  meds, ice                       [facility] Adult PT Treatment/Exercise - 09/01/18 1449      Exercises   Exercises  Knee/Hip      Knee/Hip Exercises: Aerobic   Stationary Bike  partial revolutions for ~8 min for Rt knee ROM      Knee/Hip Exercises: Standing   Heel Raises  Both;20 reps    Hip Abduction  Both;Knee straight;15 reps    Abduction Limitations  3# on Rt    Hip Extension  Both;Knee straight;15 reps    Extension Limitations  3# on Rt    Lateral Step Up  Right;10 reps;Hand Hold: 2;Step Height: 6"    Lateral Step Up Limitations  heavy reliance on UEs    Forward Step Up  Right;10 reps;Hand Hold: 2;Step Height: 6"  Gait Training  without cane with cues for heel strike and knee extension    Other Standing Knee Exercises  standing HS stretch on step 3 x 30 sec; standing flexion into step 3x30 sec      Knee/Hip Exercises: Supine   Quad Sets  Right;20 reps    Short Arc Quad Sets  Right;20 reps   3#; 5 second hold     Vasopneumatic   Number Minutes Vasopneumatic   10 minutes    Vasopnuematic Location   Knee    Vasopneumatic Pressure  Medium    Vasopneumatic Temperature   34 deg      Manual Therapy   Passive ROM  Rt knee into extension; patellar mobs, scar massage               PT Short Term Goals - 08/27/18 1012      PT SHORT TERM GOAL #1   Title  improve Rt knee AROM 0-100 for improved function    Baseline  9/25: 8-107    Status  Partially Met      PT SHORT TERM GOAL #2   Title  amb > 150' with SPC modified independent for improved function    Status  Achieved        PT Long Term Goals - 08/20/18 1223      PT LONG TERM GOAL #1   Title  I with HEP    Status  New    Target Date  10/15/18      PT LONG TERM GOAL #2    Title  improve Rt knee AROM 0-110 for improved function and mobility    Status  New    Target Date  10/15/18      PT LONG TERM GOAL #3   Title  amb > 300' without device independently for improved functional mobility    Status  New    Target Date  10/15/18      PT LONG TERM GOAL #4   Title  improve FOTO to </= 40% limitation for improved function    Status  New    Target Date  10/15/18      PT LONG TERM GOAL #5   Title  report pain < 2/10 with mobility    Status  New    Target Date  10/15/18            Plan - 09/01/18 1526    Clinical Impression Statement  Pt tolerated session well today but continues to demonstrate decreased strength with standing activities.  Gait improved with cues without device.  Recommend SPC for outdoor/community ambulation and okay to amb without device in the home.  Will continue to benefit from PT to maximize function.    Rehab Potential  Good    PT Frequency  2x / week    PT Duration  8 weeks    PT Treatment/Interventions  ADLs/Self Care Home Management;Cryotherapy;Electrical Stimulation;Ultrasound;Moist Heat;Gait training;Stair training;Functional mobility training;Therapeutic activities;Therapeutic exercise;Balance training;Neuromuscular re-education;Patient/family education;Manual techniques;Passive range of motion;Dry needling;Vasopneumatic Device;Taping    PT Next Visit Plan  continue progressive Rt knee ROM (with more focus on extension) and LE strengthening.  Stairs.  monitor BP.  standing balance and gait without device; focus on strengthening    PT Home Exercise Plan  [id number]    Consulted and Agree with Plan of Care  Patient;Family member/caregiver    Family Member Consulted  wife       Patient will benefit from skilled therapeutic intervention in order  to improve the following deficits and impairments:  Abnormal gait, Increased fascial restricitons, Increased muscle spasms, Pain, Impaired flexibility, Decreased range of motion,  Difficulty walking, Decreased balance, Decreased activity tolerance, Decreased mobility, Decreased strength  Visit Diagnosis: Acute pain of right knee  Stiffness of right knee, not elsewhere classified  Other abnormalities of gait and mobility     Problem List Patient Active Problem List   Diagnosis Date Noted  . Total knee replacement status 08/02/2018  . 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 Primary osteoarthritis of right knee 10/21/2017  . BMI 37.0-37.9, adult 04/27/2016  . Glenohumeral arthritis of both shoulders 04/14/2015  . 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 Hypogonadism male 02/26/2014  . Lower leg edema 12/02/2013  . Lymph node enlargement 12/02/2013  . CKD (chronic kidney disease) stage 3, GFR 30-59 ml/min (HCC) 05/28/2013  . Atrial fibrillation ([place]) 06/25/2012  . 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 RESTRICTIVE LUNG DISEASE 01/12/2011  . ERECTILE DYSFUNCTION 01/06/2008  . ESSENTIAL HYPERTENSION, BENIGN 01/05/2008  . Venous (peripheral) insufficiency 01/05/2008      Laureen Abrahams, PT, DPT 09/01/18 3:28 PM    [facility] [place], [place], [address] Phone: [phone]   Fax:  [phone]  Name: Jerome Hicks MRN: [id number] Date of Birth: [birth year]

## 2018-09-04 ENCOUNTER — Encounter: Payer: Self-pay | Admitting: Physical Therapy

## 2018-09-04 ENCOUNTER — Ambulatory Visit: Payer: Medicare HMO | Admitting: Family Medicine

## 2018-09-04 ENCOUNTER — Ambulatory Visit (INDEPENDENT_AMBULATORY_CARE_PROVIDER_SITE_OTHER): Payer: Medicare HMO | Admitting: Physical Therapy

## 2018-09-04 VITALS — BP 114/78 | HR 78 | Temp 98.1°F | Wt 284.4 lb

## 2018-09-04 DIAGNOSIS — R82998 Other abnormal findings in urine: Secondary | ICD-10-CM

## 2018-09-04 DIAGNOSIS — M25661 Stiffness of right knee, not elsewhere classified: Secondary | ICD-10-CM | POA: Diagnosis not present

## 2018-09-04 DIAGNOSIS — I4891 Unspecified atrial fibrillation: Secondary | ICD-10-CM | POA: Diagnosis not present

## 2018-09-04 DIAGNOSIS — R2689 Other abnormalities of gait and mobility: Secondary | ICD-10-CM

## 2018-09-04 DIAGNOSIS — M25561 Pain in right knee: Secondary | ICD-10-CM | POA: Diagnosis not present

## 2018-09-04 LAB — POCT URINALYSIS DIPSTICK
Blood, UA: NEGATIVE
GLUCOSE UA: NEGATIVE
Leukocytes, UA: NEGATIVE
Nitrite, UA: NEGATIVE
Protein, UA: POSITIVE — AB
Spec Grav, UA: >=1.03 — AB (ref 1.010–1.025)
Urobilinogen, UA: 4 E.U./dL — AB
pH, UA: 6 (ref 5.0–8.0)

## 2018-09-04 LAB — POCT INR: INR: 3.9 — AB (ref 2.0–3.0)

## 2018-09-04 LAB — POCT UA - MICROALBUMIN
Albumin/Creatinine Ratio, Urine, POC: ABNORMAL
Creatinine, POC: 300 mg/dL
MICROALBUMIN (UR) POC: 80 mg/L

## 2018-09-04 NOTE — Therapy (Signed)
[place], [place], [address] Phone: [phone]   Fax:  [phone]  Physical Therapy Treatment  Patient Details  Name: Jerome Hicks MRN: [id number] Date of Birth: [birth year] Referring Provider (PT): Dorna Leitz, MD   Encounter Date: 09/04/2018  PT End of Session - 09/04/18 1232    Visit Number  6    Number of Visits  16    Date for PT Re-Evaluation  10/15/18    Authorization Type  [employer]    PT Start Time  0930    PT Stop Time  1028    PT Time Calculation (min)  58 min    Activity Tolerance  Patient tolerated treatment well    Behavior During Therapy  [facility] for tasks assessed/performed       Past Medical History:  Diagnosis Date  . Arthritis    oa  . 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 Ascending aortic aneurysm ([place])    seen on 01-31-18 ct chest ; patient denies awareness  . Atrial fibrillation ([place])   . Cataracts, bilateral   . ED (erectile dysfunction)    has been prescribed Levitra and Viagrain in the past  . Hypertension   . Internal hemorrhoids   . Kidney stones   . Leg edema    recurrent cellulitis  . Scrotal abscess 3-03  . Shoulder arthritis   . Sigmoid diverticulosis     Past Surgical History:  Procedure Laterality Date  .  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 CHOLECYSTECTOMY  1971  . kidney stones  1990's   Ureteroscopic stone extraction  . TONSILLECTOMY    . 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   Procedure: RIGHT TOTAL KNEE ARTHROPLASTY;  Surgeon: Dorna Leitz, MD;  Location: [facility];  Service: Orthopedics;  Laterality: Right;    There were no vitals filed for this visit.  Subjective Assessment - 09/04/18 1230    Subjective  Pt reports no new changes since last visit.   He and his wife are not comfortable with him driving yet, even though he has been release to drive.     Patient is accompained by:  Family member   wife   Currently in Pain?  No/denies    Pain Score  0-No pain         [facility] PT Assessment - 09/04/18 0001      Assessment   Medical Diagnosis  Rt TKA    Referring Provider (PT)  Dorna Leitz, MD    Onset Date/Surgical Date  08/01/18    Next MD Visit  09/29/18      AROM   Right Knee Extension  -12      PROM   Right Knee Flexion  116       [facility] Adult PT Treatment/Exercise - 09/04/18 0001      Exercises   Exercises  Knee/Hip      Knee/Hip Exercises: Stretches   Passive Hamstring Stretch  Right;2 reps    Gastroc Stretch  Right;Left;2 reps;30 seconds      Knee/Hip Exercises: Aerobic   Stationary Bike  partial to full revolutions for ~5 min for Rt knee ROM      Knee/Hip Exercises: Standing   Lateral Step Up  Right;10 reps;Hand Hold: 2;Step Height: 6"    Lateral Step Up Limitations  heavy reliance on UEs    Forward Step Up  Right;10 reps;Hand Hold: 2;Step Height: 6"  Step Down  Left;1 set;10 reps;Hand Hold: 2   3" step   Step Down Limitations  heavy reliance on UE.     Other Standing Knee Exercises  retro gait with unilateral support on counter, focus on TKE 8 ft x 6 reps       Knee/Hip Exercises: Seated   Sit to Sand  1 set;10 reps   feet even, min hand support, low black mat     Knee/Hip Exercises: Supine   Heel Slides  --   2 reps for measurement     Knee/Hip Exercises: Prone   Hamstring Curl  5 reps;3 sets    Prone Knee Hang  --   30 seconds, 3 reps      Vasopneumatic   Number Minutes Vasopneumatic   10 minutes    Vasopnuematic Location   Knee   Rt   Vasopneumatic Pressure  Medium    Vasopneumatic Temperature   34 deg      Manual Therapy   Passive ROM  Rt knee into extension; patellar mobs, scar massage               PT Short Term Goals - 08/27/18 1012      PT SHORT TERM GOAL #1   Title  improve Rt knee AROM 0-100 for improved function    Baseline  9/25: 8-107    Status  Partially Met      PT SHORT TERM GOAL #2   Title  amb > 150' with SPC  modified independent for improved function    Status  Achieved        PT Long Term Goals - 08/20/18 1223      PT LONG TERM GOAL #1   Title  I with HEP    Status  New    Target Date  10/15/18      PT LONG TERM GOAL #2   Title  improve Rt knee AROM 0-110 for improved function and mobility    Status  New    Target Date  10/15/18      PT LONG TERM GOAL #3   Title  amb > 300' without device independently for improved functional mobility    Status  New    Target Date  10/15/18      PT LONG TERM GOAL #4   Title  improve FOTO to </= 40% limitation for improved function    Status  New    Target Date  10/15/18      PT LONG TERM GOAL #5   Title  report pain < 2/10 with mobility    Status  New    Target Date  10/15/18            Plan - 09/04/18 1233    Clinical Impression Statement  Pt's Rt knee ext ROM more limited than last session 3 days ago.  Pt reported mild increase in pain (up to 4/10) with exercises; reduced with rest and vaso at end of session.  Will continue to benefit from PT intervention to maximize functional mobility and safety.     Rehab Potential  Good    PT Frequency  2x / week    PT Duration  8 weeks    PT Treatment/Interventions  ADLs/Self Care Home Management;Cryotherapy;Electrical Stimulation;Ultrasound;Moist Heat;Gait training;Stair training;Functional mobility training;Therapeutic activities;Therapeutic exercise;Balance training;Neuromuscular re-education;Patient/family education;Manual techniques;Passive range of motion;Dry needling;Vasopneumatic Device;Taping    PT Next Visit Plan  continue progressive Rt knee ROM (with more focus on extension) and LE  strengthening.  Stairs.  monitor BP as needed.  standing balance and gait without device; focus on strengthening    Consulted and Agree with Plan of Care  Patient;Family member/caregiver    Family Member Consulted  wife       Patient will benefit from skilled therapeutic intervention in order to improve  the following deficits and impairments:  Abnormal gait, Increased fascial restricitons, Increased muscle spasms, Pain, Impaired flexibility, Decreased range of motion, Difficulty walking, Decreased balance, Decreased activity tolerance, Decreased mobility, Decreased strength  Visit Diagnosis: Acute pain of right knee  Stiffness of right knee, not elsewhere classified  Other abnormalities of gait and mobility     Problem List Patient Active Problem List   Diagnosis Date Noted  . Total knee replacement status 08/02/2018  . 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 Primary osteoarthritis of right knee 10/21/2017  . BMI 37.0-37.9, adult 04/27/2016  . Glenohumeral arthritis of both shoulders 04/14/2015  . 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 Hypogonadism male 02/26/2014  . Lower leg edema 12/02/2013  . Lymph node enlargement 12/02/2013  . CKD (chronic kidney disease) stage 3, GFR 30-59 ml/min (HCC) 05/28/2013  . Atrial fibrillation ([place]) 06/25/2012  . 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 RESTRICTIVE LUNG DISEASE 01/12/2011  . ERECTILE DYSFUNCTION 01/06/2008  . ESSENTIAL HYPERTENSION, BENIGN 01/05/2008  . Venous (peripheral) insufficiency 01/05/2008   Kerin Perna, PTA 09/04/18 12:37 PM  [place], [place], [address] Phone: [phone]   Fax:  [phone]  Name: Jerome Hicks MRN: [id number] Date of Birth: 1951-09-28

## 2018-09-05 NOTE — Progress Notes (Signed)
Pt was here for INR & urine recheck. As per pt, feeling good. Denies missed/exstra doses, abnormal bleeding or bruising. Pt aware we will call with any changes or updates.

## 2018-09-09 ENCOUNTER — Encounter: Payer: Self-pay | Admitting: Physical Therapy

## 2018-09-09 ENCOUNTER — Ambulatory Visit (INDEPENDENT_AMBULATORY_CARE_PROVIDER_SITE_OTHER): Payer: Medicare HMO | Admitting: Physical Therapy

## 2018-09-09 DIAGNOSIS — M25561 Pain in right knee: Secondary | ICD-10-CM | POA: Diagnosis not present

## 2018-09-09 DIAGNOSIS — M25661 Stiffness of right knee, not elsewhere classified: Secondary | ICD-10-CM | POA: Diagnosis not present

## 2018-09-09 DIAGNOSIS — R2689 Other abnormalities of gait and mobility: Secondary | ICD-10-CM

## 2018-09-09 NOTE — Therapy (Signed)
[place], [place], [address] Phone: [phone]   Fax:  [phone]  Physical Therapy Treatment  Patient Details  Name: Jerome Hicks MRN: [id number] Date of Birth: 1951-10-07 Referring Provider (PT): Dorna Leitz, MD   Encounter Date: 09/09/2018  PT End of Session - 09/09/18 1024    Visit Number  7    Number of Visits  16    Date for PT Re-Evaluation  10/15/18    Authorization Type  [employer]    PT Start Time  0845    PT Stop Time  0940    PT Time Calculation (min)  55 min    Activity Tolerance  Patient tolerated treatment well    Behavior During Therapy  [facility] for tasks assessed/performed       Past Medical History:  Diagnosis Date  . Arthritis    oa  . 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 Ascending aortic aneurysm ([place])    seen on 01-31-18 ct chest ; patient denies awareness  . Atrial fibrillation ([place])   . Cataracts, bilateral   . ED (erectile dysfunction)    has been prescribed Levitra and Viagrain in the past  . Hypertension   . Internal hemorrhoids   . Kidney stones   . Leg edema    recurrent cellulitis  . Scrotal abscess 3-03  . Shoulder arthritis   . Sigmoid diverticulosis     Past Surgical History:  Procedure Laterality Date  .  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 CHOLECYSTECTOMY  1971  . kidney stones  1990's   Ureteroscopic stone extraction  . TONSILLECTOMY    . 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   Procedure: RIGHT TOTAL KNEE ARTHROPLASTY;  Surgeon: Dorna Leitz, MD;  Location: [facility];  Service: Orthopedics;  Laterality: Right;    There were no vitals filed for this visit.  Subjective Assessment - 09/09/18 0843    Subjective  knee is doing well; walking without cane at home    Patient is accompained by:  Family member   wife   Patient Stated Goals  improve mobility    Pain Score  1     Pain Location  Knee    Pain  Orientation  Right    Pain Descriptors / Indicators  Aching    Pain Type  Surgical pain    Pain Onset  1 to 4 weeks ago    Pain Frequency  Intermittent    Aggravating Factors   bending, working on extension    Pain Relieving Factors  meds, ice                       [facility] Adult PT Treatment/Exercise - 09/09/18 0848      Exercises   Exercises  Knee/Hip      Knee/Hip Exercises: Aerobic   Stationary Bike  partial to full revolutions for ~8 min for Rt knee ROM      Knee/Hip Exercises: Standing   Forward Step Up  Right;10 reps;Hand Hold: 2;Step Height: 6"    Forward Step Up Limitations  cues to decrease vaulting on Lt    Gait Training  without cane with cues for heel strike and knee extension      Knee/Hip Exercises: Supine   Short Arc Quad Sets  Right;2 sets;10 reps   3#   Straight Leg Raises  Right;2 sets;10 reps   3#  Knee/Hip Exercises: Sidelying   Hip ABduction  Right;2 sets;10 reps   3#     Vasopneumatic   Number Minutes Vasopneumatic   10 minutes    Vasopnuematic Location   Knee    Vasopneumatic Pressure  Medium    Vasopneumatic Temperature   34 deg      Manual Therapy   Passive ROM  Rt knee into extension; patellar mobs, scar massage; IASTM to quads/hamstrings               PT Short Term Goals - 08/27/18 1012      PT SHORT TERM GOAL #1   Title  improve Rt knee AROM 0-100 for improved function    Baseline  9/25: 8-107    Status  Partially Met      PT SHORT TERM GOAL #2   Title  amb > 150' with [place] modified independent for improved function    Status  Achieved        PT Long Term Goals - 08/20/18 1223      PT LONG TERM GOAL #1   Title  I with HEP    Status  New    Target Date  10/15/18      PT LONG TERM GOAL #2   Title  improve Rt knee AROM 0-110 for improved function and mobility    Status  New    Target Date  10/15/18      PT LONG TERM GOAL #3   Title  amb > 300' without device independently for improved functional  mobility    Status  New    Target Date  10/15/18      PT LONG TERM GOAL #4   Title  improve FOTO to </= 40% limitation for improved function    Status  New    Target Date  10/15/18      PT LONG TERM GOAL #5   Title  report pain < 2/10 with mobility    Status  New    Target Date  10/15/18            Plan - 09/09/18 1025    Clinical Impression Statement  Pt tolerated session well today with focus on extension and strengthening exercises.  Still lacking terminal knee extension.  Will continue to benefit from PT to maximize function.    Rehab Potential  Good    PT Frequency  2x / week    PT Duration  8 weeks    PT Treatment/Interventions  ADLs/Self Care Home Management;Cryotherapy;Electrical Stimulation;Ultrasound;Moist Heat;Gait training;Stair training;Functional mobility training;Therapeutic activities;Therapeutic exercise;Balance training;Neuromuscular re-education;Patient/family education;Manual techniques;Passive range of motion;Dry needling;Vasopneumatic Device;Taping    PT Next Visit Plan  continue progressive Rt knee ROM (with more focus on extension) and LE strengthening.  Stairs.  monitor BP as needed.  standing balance and gait without device; focus on strengthening    PT Home Exercise Plan  [id number]    Consulted and Agree with Plan of Care  Patient;Family member/caregiver    Family Member Consulted  wife       Patient will benefit from skilled therapeutic intervention in order to improve the following deficits and impairments:  Abnormal gait, Increased fascial restricitons, Increased muscle spasms, Pain, Impaired flexibility, Decreased range of motion, Difficulty walking, Decreased balance, Decreased activity tolerance, Decreased mobility, Decreased strength  Visit Diagnosis: Acute pain of right knee  Stiffness of right knee, not elsewhere classified  Other abnormalities of gait and mobility     Problem List Patient Active Problem  List   Diagnosis Date Noted   . Total knee replacement status 08/02/2018  . 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 Primary osteoarthritis of right knee 10/21/2017  . BMI 37.0-37.9, adult 04/27/2016  . Glenohumeral arthritis of both shoulders 04/14/2015  . 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 Hypogonadism male 02/26/2014  . Lower leg edema 12/02/2013  . Lymph node enlargement 12/02/2013  . CKD (chronic kidney disease) stage 3, GFR 30-59 ml/min (HCC) 05/28/2013  . Atrial fibrillation ([place]) 06/25/2012  . 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 RESTRICTIVE LUNG DISEASE 01/12/2011  . ERECTILE DYSFUNCTION 01/06/2008  . ESSENTIAL HYPERTENSION, BENIGN 01/05/2008  . Venous (peripheral) insufficiency 01/05/2008      Laureen Abrahams, PT, DPT 09/09/18 10:26 AM     [facility] [place], [place], [address] Phone: [phone]   Fax:  [phone]  Name: EFTON THOMLEY MRN: [id number] Date of Birth: July 07, 1951

## 2018-09-11 ENCOUNTER — Encounter: Payer: Self-pay | Admitting: Physical Therapy

## 2018-09-11 ENCOUNTER — Ambulatory Visit (INDEPENDENT_AMBULATORY_CARE_PROVIDER_SITE_OTHER): Payer: Medicare HMO | Admitting: Physical Therapy

## 2018-09-11 DIAGNOSIS — R2689 Other abnormalities of gait and mobility: Secondary | ICD-10-CM | POA: Diagnosis not present

## 2018-09-11 DIAGNOSIS — M25561 Pain in right knee: Secondary | ICD-10-CM

## 2018-09-11 DIAGNOSIS — M25661 Stiffness of right knee, not elsewhere classified: Secondary | ICD-10-CM

## 2018-09-11 NOTE — Therapy (Signed)
[place], [place], [address] Phone: [phone]   Fax:  [phone]  Physical Therapy Treatment  Patient Details  Name: Jerome Hicks MRN: [id number] Date of Birth: January 21, 1951 Referring Provider (PT): Dorna Leitz, MD   Encounter Date: 09/11/2018  PT End of Session - 09/11/18 1030    Visit Number  8    Number of Visits  16    Date for PT Re-Evaluation  10/15/18    Authorization Type  [employer]    PT Start Time  0922    PT Stop Time  1020    PT Time Calculation (min)  58 min    Activity Tolerance  Patient tolerated treatment well    Behavior During Therapy  [facility] for tasks assessed/performed       Past Medical History:  Diagnosis Date  . Arthritis    oa  . 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 Ascending aortic aneurysm ([place])    seen on 01-31-18 ct chest ; patient denies awareness  . Atrial fibrillation ([place])   . Cataracts, bilateral   . ED (erectile dysfunction)    has been prescribed Levitra and Viagrain in the past  . Hypertension   . Internal hemorrhoids   . Kidney stones   . Leg edema    recurrent cellulitis  . Scrotal abscess 3-03  . Shoulder arthritis   . Sigmoid diverticulosis     Past Surgical History:  Procedure Laterality Date  .  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 CHOLECYSTECTOMY  1971  . kidney stones  1990's   Ureteroscopic stone extraction  . TONSILLECTOMY    . 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   Procedure: RIGHT TOTAL KNEE ARTHROPLASTY;  Surgeon: Dorna Leitz, MD;  Location: [facility];  Service: Orthopedics;  Laterality: Right;    There were no vitals filed for this visit.  Subjective Assessment - 09/11/18 0926    Subjective  still a little sore in the knee but overall doing well    Patient is accompained by:  Family member   wife   Patient Stated Goals  improve mobility    Currently in Pain?  No/denies    Pain Onset  1  to 4 weeks ago         [facility] PT Assessment - 09/11/18 0943      Assessment   Medical Diagnosis  Rt TKA    Referring Provider (PT)  Dorna Leitz, MD    Onset Date/Surgical Date  08/01/18    Next MD Visit  09/29/18      AROM   Right Knee Extension  -6                   [facility] Adult PT Treatment/Exercise - 09/11/18 0926      Exercises   Exercises  Knee/Hip      Knee/Hip Exercises: Stretches   Passive Hamstring Stretch  Right;2 reps;30 seconds   standing with Rt heel on step   Knee: Self-Stretch to increase Flexion  Right;3 reps;30 seconds    Knee: Self-Stretch Limitations  on 2nd 4in. step    Gastroc Stretch  Right;3 reps;30 seconds    Gastroc Stretch Limitations  standing; heel off stretch      Knee/Hip Exercises: Aerobic   Stationary Bike  partial to full revolutions for ~8 min for Rt knee ROM      Knee/Hip Exercises: Standing   Terminal Knee  Extension  Right;2 sets;10 reps;Theraband    Theraband Level (Terminal Knee Extension)  Level 3 (Green)    Hip Abduction  Right;2 sets;10 reps;Knee straight    Abduction Limitations  green theraband; increased difficulty and limited range    Hip Extension  Right;2 sets;10 reps;Knee straight    Extension Limitations  green theraband; cues for terminal knee extension    Gait Training  without cane with cues for heel strike and knee extension x 3 laps      Knee/Hip Exercises: Supine   Short Arc Quad Sets  Right;2 sets;10 reps   3#   Straight Leg Raises  Right;2 sets;10 reps   3#     Vasopneumatic   Number Minutes Vasopneumatic   10 minutes    Vasopnuematic Location   Knee    Vasopneumatic Pressure  Medium    Vasopneumatic Temperature   34 deg      Manual Therapy   Manual Therapy  Taping    Passive ROM  Rt knee into extension; patellar mobs, scar massage    Kinesiotex  Edema      Kinesiotix   Edema  squid shaped big daddy rock tape to anterior Rt knee with 10% stretch               PT Short Term Goals  - 08/27/18 1012      PT SHORT TERM GOAL #1   Title  improve Rt knee AROM 0-100 for improved function    Baseline  9/25: 8-107    Status  Partially Met      PT SHORT TERM GOAL #2   Title  amb > 150' with SPC modified independent for improved function    Status  Achieved        PT Long Term Goals - 08/20/18 1223      PT LONG TERM GOAL #1   Title  I with HEP    Status  New    Target Date  10/15/18      PT LONG TERM GOAL #2   Title  improve Rt knee AROM 0-110 for improved function and mobility    Status  New    Target Date  10/15/18      PT LONG TERM GOAL #3   Title  amb > 300' without device independently for improved functional mobility    Status  New    Target Date  10/15/18      PT LONG TERM GOAL #4   Title  improve FOTO to </= 40% limitation for improved function    Status  New    Target Date  10/15/18      PT LONG TERM GOAL #5   Title  report pain < 2/10 with mobility    Status  New    Target Date  10/15/18            Plan - 09/11/18 1030    Clinical Impression Statement  Pt with improved Rt knee extension today lacking only 6 degrees.  Session focused on terminal knee extension and quad strengthening.  Will continue to benefit from PT to maximize function.    Rehab Potential  Good    PT Frequency  2x / week    PT Duration  8 weeks    PT Treatment/Interventions  ADLs/Self Care Home Management;Cryotherapy;Electrical Stimulation;Ultrasound;Moist Heat;Gait training;Stair training;Functional mobility training;Therapeutic activities;Therapeutic exercise;Balance training;Neuromuscular re-education;Patient/family education;Manual techniques;Passive range of motion;Dry needling;Vasopneumatic Device;Taping    PT Next Visit Plan  standing balance and gait  without device; focus on strengthening; knee extension    PT Home Exercise Plan  [id number]    Consulted and Agree with Plan of Care  Patient;Family member/caregiver    Family Member Consulted  wife       Patient  will benefit from skilled therapeutic intervention in order to improve the following deficits and impairments:  Abnormal gait, Increased fascial restricitons, Increased muscle spasms, Pain, Impaired flexibility, Decreased range of motion, Difficulty walking, Decreased balance, Decreased activity tolerance, Decreased mobility, Decreased strength  Visit Diagnosis: Acute pain of right knee  Stiffness of right knee, not elsewhere classified  Other abnormalities of gait and mobility     Problem List Patient Active Problem List   Diagnosis Date Noted  . Total knee replacement status 08/02/2018  . 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 Primary osteoarthritis of right knee 10/21/2017  . BMI 37.0-37.9, adult 04/27/2016  . Glenohumeral arthritis of both shoulders 04/14/2015  . 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 Hypogonadism male 02/26/2014  . Lower leg edema 12/02/2013  . Lymph node enlargement 12/02/2013  . CKD (chronic kidney disease) stage 3, GFR 30-59 ml/min (HCC) 05/28/2013  . Atrial fibrillation ([place]) 06/25/2012  . 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 RESTRICTIVE LUNG DISEASE 01/12/2011  . ERECTILE DYSFUNCTION 01/06/2008  . ESSENTIAL HYPERTENSION, BENIGN 01/05/2008  . Venous (peripheral) insufficiency 01/05/2008      Laureen Abrahams, PT, DPT 09/11/18 10:41 AM     [facility] [place], [place], [address] Phone: [phone]   Fax:  [phone]  Name: TYMEL CONELY MRN: [id number] Date of Birth: [birth year]

## 2018-09-15 ENCOUNTER — Ambulatory Visit (INDEPENDENT_AMBULATORY_CARE_PROVIDER_SITE_OTHER): Payer: Medicare HMO | Admitting: Physical Therapy

## 2018-09-15 ENCOUNTER — Encounter: Payer: Self-pay | Admitting: Physical Therapy

## 2018-09-15 DIAGNOSIS — M25661 Stiffness of right knee, not elsewhere classified: Secondary | ICD-10-CM

## 2018-09-15 DIAGNOSIS — R2689 Other abnormalities of gait and mobility: Secondary | ICD-10-CM

## 2018-09-15 DIAGNOSIS — M25561 Pain in right knee: Secondary | ICD-10-CM

## 2018-09-15 NOTE — Therapy (Signed)
[place], [place], [address] Phone: [phone]   Fax:  [phone]  Physical Therapy Treatment  Patient Details  Name: Jerome Hicks MRN: [id number] Date of Birth: 07-20-1951 Referring Provider (PT): Dorna Leitz, MD   Encounter Date: 09/15/2018  PT End of Session - 09/15/18 0927    Visit Number  9    Number of Visits  16    Date for PT Re-Evaluation  10/15/18    Authorization Type  [employer]    PT Start Time  0845    PT Stop Time  0937    PT Time Calculation (min)  52 min    Activity Tolerance  Patient tolerated treatment well    Behavior During Therapy  [facility] for tasks assessed/performed       Past Medical History:  Diagnosis Date  . Arthritis    oa  . 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 Ascending aortic aneurysm ([place])    seen on 01-31-18 ct chest ; patient denies awareness  . Atrial fibrillation ([place])   . Cataracts, bilateral   . ED (erectile dysfunction)    has been prescribed Levitra and Viagrain in the past  . Hypertension   . Internal hemorrhoids   . Kidney stones   . Leg edema    recurrent cellulitis  . Scrotal abscess 3-03  . Shoulder arthritis   . Sigmoid diverticulosis     Past Surgical History:  Procedure Laterality Date  .  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 CHOLECYSTECTOMY  1971  . kidney stones  1990's   Ureteroscopic stone extraction  . TONSILLECTOMY    . 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   Procedure: RIGHT TOTAL KNEE ARTHROPLASTY;  Surgeon: Dorna Leitz, MD;  [facility];  Service: Orthopedics;  Laterality: Right;    There were no vitals filed for this visit.  Subjective Assessment - 09/15/18 0851    Subjective  knee is still sore and swollen, but otherwise doing well.    Patient is accompained by:  Family member   wife   Patient Stated Goals  improve mobility    Currently in Pain?  No/denies    Pain Onset   1 to 4 weeks ago                       [facility] Adult PT Treatment/Exercise - 09/15/18 0851      Exercises   Exercises  Knee/Hip      Knee/Hip Exercises: Aerobic   Stationary Bike  partial to full revolutions for ~8 min for Rt knee ROM      Knee/Hip Exercises: Standing   SLS  on blue foam with intermittent UE support: 5 x 15 sec; RLE    Walking with Sports Cord  green theraband: side stepping; backwards walking    Gait Training  without cane with cues for heel strike and knee extension x 3 laps      Knee/Hip Exercises: Supine   Quad Sets  Right;20 reps   heel prop on bolster     Vasopneumatic   Number Minutes Vasopneumatic   10 minutes    Vasopnuematic Location   Knee    Vasopneumatic Pressure  Medium    Vasopneumatic Temperature   34 deg      Manual Therapy   Manual Therapy  Taping;Passive ROM    Passive ROM  Rt knee into extension; patellar mobs,  scar massage    Kinesiotex  Edema      Kinesiotix   Edema  squid shaped big daddy rock tape to anterior Rt knee with 10% stretch               PT Short Term Goals - 08/27/18 1012      PT SHORT TERM GOAL #1   Title  improve Rt knee AROM 0-100 for improved function    Baseline  9/25: 8-107    Status  Partially Met      PT SHORT TERM GOAL #2   Title  amb > 150' with SPC modified independent for improved function    Status  Achieved        PT Long Term Goals - 08/20/18 1223      PT LONG TERM GOAL #1   Title  I with HEP    Status  New    Target Date  10/15/18      PT LONG TERM GOAL #2   Title  improve Rt knee AROM 0-110 for improved function and mobility    Status  New    Target Date  10/15/18      PT LONG TERM GOAL #3   Title  amb > 300' without device independently for improved functional mobility    Status  New    Target Date  10/15/18      PT LONG TERM GOAL #4   Title  improve FOTO to </= 40% limitation for improved function    Status  New    Target Date  10/15/18      PT LONG TERM  GOAL #5   Title  report pain < 2/10 with mobility    Status  New    Target Date  10/15/18            Plan - 09/15/18 0927    Clinical Impression Statement  Pt demonstrating improved functional mobility and progressing well with PT.  Swelling improved with taping so repeated today.  Will continue to benefit from PT to maximize function.    Rehab Potential  Good    PT Frequency  2x / week    PT Duration  8 weeks    PT Treatment/Interventions  ADLs/Self Care Home Management;Cryotherapy;Electrical Stimulation;Ultrasound;Moist Heat;Gait training;Stair training;Functional mobility training;Therapeutic activities;Therapeutic exercise;Balance training;Neuromuscular re-education;Patient/family education;Manual techniques;Passive range of motion;Dry needling;Vasopneumatic Device;Taping    PT Next Visit Plan  progress note, FOTO, measure    PT Home Exercise Plan  [phone]    Consulted and Agree with Plan of Care  Patient;Family member/caregiver    Family Member Consulted  wife       Patient will benefit from skilled therapeutic intervention in order to improve the following deficits and impairments:  Abnormal gait, Increased fascial restricitons, Increased muscle spasms, Pain, Impaired flexibility, Decreased range of motion, Difficulty walking, Decreased balance, Decreased activity tolerance, Decreased mobility, Decreased strength  Visit Diagnosis: Acute pain of right knee  Stiffness of right knee, not elsewhere classified  Other abnormalities of gait and mobility     Problem List Patient Active Problem List   Diagnosis Date Noted  . Total knee replacement status 08/02/2018  . 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 Primary osteoarthritis of right knee 10/21/2017  . BMI 37.0-37.9, adult 04/27/2016  . Glenohumeral arthritis of both shoulders 04/14/2015  . 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 Hypogonadism male 02/26/2014  . Lower leg edema 12/02/2013  . Lymph node enlargement  12/02/2013  . CKD (chronic kidney  disease) stage 3, GFR 30-59 ml/min (HCC) 05/28/2013  . Atrial fibrillation ([place]) 06/25/2012  . 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 RESTRICTIVE LUNG DISEASE 01/12/2011  . ERECTILE DYSFUNCTION 01/06/2008  . ESSENTIAL HYPERTENSION, BENIGN 01/05/2008  . Venous (peripheral) insufficiency 01/05/2008      Laureen Abrahams, PT, DPT 09/15/18 9:29 AM    [facility] [place], [place], [address] Phone: [phone]   Fax:  [phone]  Name: CHALMERS IDDINGS MRN: [id number] Date of Birth: Dec 17, 1950

## 2018-09-18 ENCOUNTER — Encounter: Payer: Self-pay | Admitting: Physical Therapy

## 2018-09-18 ENCOUNTER — Ambulatory Visit (INDEPENDENT_AMBULATORY_CARE_PROVIDER_SITE_OTHER): Payer: Medicare HMO | Admitting: Physical Therapy

## 2018-09-18 ENCOUNTER — Ambulatory Visit (INDEPENDENT_AMBULATORY_CARE_PROVIDER_SITE_OTHER): Payer: Medicare HMO | Admitting: Family Medicine

## 2018-09-18 DIAGNOSIS — I4891 Unspecified atrial fibrillation: Secondary | ICD-10-CM

## 2018-09-18 DIAGNOSIS — M25561 Pain in right knee: Secondary | ICD-10-CM | POA: Diagnosis not present

## 2018-09-18 DIAGNOSIS — M25661 Stiffness of right knee, not elsewhere classified: Secondary | ICD-10-CM | POA: Diagnosis not present

## 2018-09-18 DIAGNOSIS — R2689 Other abnormalities of gait and mobility: Secondary | ICD-10-CM

## 2018-09-18 LAB — POCT INR: INR: 2.7 (ref 2.0–3.0)

## 2018-09-18 NOTE — Progress Notes (Signed)
Pt advised.

## 2018-09-18 NOTE — Therapy (Signed)
[place], [place], [address] Phone: [phone]   Fax:  [phone]  Physical Therapy Treatment/Progress Note  Patient Details  Name: Jerome Hicks MRN: [id number] Date of Birth: Apr 04, 1951 Referring Provider (PT): Dorna Leitz, MD    Progress Note Reporting Period 08/20/18 to 09/18/18  See note below for Objective Data and Assessment of Progress/Goals.      Encounter Date: 09/18/2018  PT End of Session - 09/18/18 0932    Visit Number  10    Number of Visits  16    Date for PT Re-Evaluation  10/15/18    Authorization Type  [employer]    PT Start Time  0928    PT Stop Time  1028    PT Time Calculation (min)  60 min       Past Medical History:  Diagnosis Date  . Arthritis    oa  . 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 Ascending aortic aneurysm ([place])    seen on 01-31-18 ct chest ; patient denies awareness  . Atrial fibrillation ([place])   . Cataracts, bilateral   . ED (erectile dysfunction)    has been prescribed Levitra and Viagrain in the past  . Hypertension   . Internal hemorrhoids   . Kidney stones   . Leg edema    recurrent cellulitis  . Scrotal abscess 3-03  . Shoulder arthritis   . Sigmoid diverticulosis     Past Surgical History:  Procedure Laterality Date  .  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 CHOLECYSTECTOMY  1971  . kidney stones  1990's   Ureteroscopic stone extraction  . TONSILLECTOMY    . 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   Procedure: RIGHT TOTAL KNEE ARTHROPLASTY;  Surgeon: Dorna Leitz, MD;  Location: [facility];  Service: Orthopedics;  Laterality: Right;    There were no vitals filed for this visit.  Subjective Assessment - 09/18/18 1200    Subjective  Pt reports no new changes other than the sides of his knee have been sore.     Patient is accompained by:  Family member    Currently in Pain?  No/denies    Pain Score   0-No pain    Pain Orientation  Right         [facility] PT Assessment - 09/18/18 0001      Assessment   Medical Diagnosis  Rt TKA    Referring Provider (PT)  Dorna Leitz, MD    Onset Date/Surgical Date  08/01/18    Next MD Visit  09/29/18      AROM   Right Knee Extension  -11    Right Knee Flexion  112   heel slide with strap       [facility] Adult PT Treatment/Exercise - 09/18/18 0001      Knee/Hip Exercises: Stretches   Passive Hamstring Stretch  Right;5 reps;10 seconds    Quad Stretch  Right;2 reps;30 seconds   prone with strap   Gastroc Stretch  Right;2 reps;30 seconds      Knee/Hip Exercises: Aerobic   Stationary Bike  partial to full revolutions for ~8 min for Rt knee ROM   PTA present to discuss progress and monitor     Knee/Hip Exercises: Standing   Lateral Step Up  Right;1 set;10 reps;Hand Hold: 1;Step Height: 6"    Forward Step Up  Right;10 reps;Step Height: 6";Hand Hold: 1    SLS  Rt/Lt SLS x 4 reps of up to 7-8 sec     Other Standing Knee Exercises  forward lunge with foot on 13" step to increase Rt knee flexion x 5-10 sec hold, x 10 reps;        Knee/Hip Exercises: Seated   Sit to Sand  1 set;10 reps   Lt ft forward, low black mat     Knee/Hip Exercises: Supine   Bridges  1 set;10 reps      Knee/Hip Exercises: Prone   Hamstring Curl  5 reps;3 sets    Prone Knee Hang  --   30 seconds, 3 reps      Vasopneumatic   Number Minutes Vasopneumatic   10 minutes    Vasopnuematic Location   Knee    Vasopneumatic Pressure  Medium    Vasopneumatic Temperature   34 deg      Manual Therapy   Soft tissue mobilization  IASTM and STM to Rt hamstring to decrease fascial tightness and improve ROM.     Passive ROM  Rt knee into extension    Kinesiotex  Edema      Kinesiotix   Edema  squid shaped big daddy rock tape to anterior Rt knee with 10% stretch.  I strip applied to med/lateral Rt knee with 50% stretch to assist in pain reduction and decompression of tissue.                 PT Short Term Goals - 08/27/18 1012      PT SHORT TERM GOAL #1   Title  improve Rt knee AROM 0-100 for improved function    Baseline  9/25: 8-107    Status  Partially Met      PT SHORT TERM GOAL #2   Title  amb > 150' with SPC modified independent for improved function    Status  Achieved        PT Long Term Goals - 09/18/18 1154      PT LONG TERM GOAL #1   Title  I with HEP    Time  4    Period  Weeks    Status  On-going      PT LONG TERM GOAL #2   Title  improve Rt knee AROM 0-110 for improved function and mobility    Time  4    Period  Weeks    Status  Partially Met      PT LONG TERM GOAL #3   Title  amb > 300' without device independently for improved functional mobility    Time  4    Period  Weeks    Status  Partially Met      PT LONG TERM GOAL #4   Title  improve FOTO to </= 40% limitation for improved function    Time  4    Period  Weeks    Status  On-going      PT LONG TERM GOAL #5   Title  report pain < 2/10 with mobility    Status  Partially Met            Plan - 09/18/18 1151    Clinical Impression Statement  Pt's Rt knee flexion ROM gradually improving; continues to be limited with ext ROM.  He is able to ascend/descend stairs with greater ease.  He has decreased balance with Rt/Lt SLS; will benefit from continued proprioceptive training. Pt has partially met his goals and will benefit from continued PT intervention to  max functional mobility and safety.     Rehab Potential  Good    PT Frequency  2x / week    PT Duration  8 weeks    PT Treatment/Interventions  ADLs/Self Care Home Management;Cryotherapy;Electrical Stimulation;Ultrasound;Moist Heat;Gait training;Stair training;Functional mobility training;Therapeutic activities;Therapeutic exercise;Balance training;Neuromuscular re-education;Patient/family education;Manual techniques;Passive range of motion;Dry needling;Vasopneumatic Device;Taping    PT Next Visit Plan  continue  progressive ROM, functional strengthening.     Consulted and Agree with Plan of Care  Patient;Family member/caregiver    Family Member Consulted  wife       Patient will benefit from skilled therapeutic intervention in order to improve the following deficits and impairments:  Abnormal gait, Increased fascial restricitons, Increased muscle spasms, Pain, Impaired flexibility, Decreased range of motion, Difficulty walking, Decreased balance, Decreased activity tolerance, Decreased mobility, Decreased strength  Visit Diagnosis: Acute pain of right knee  Stiffness of right knee, not elsewhere classified  Other abnormalities of gait and mobility     Problem List Patient Active Problem List   Diagnosis Date Noted  . Total knee replacement status 08/02/2018  . 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 Primary osteoarthritis of right knee 10/21/2017  . BMI 37.0-37.9, adult 04/27/2016  . Glenohumeral arthritis of both shoulders 04/14/2015  . 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 Hypogonadism male 02/26/2014  . Lower leg edema 12/02/2013  . Lymph node enlargement 12/02/2013  . CKD (chronic kidney disease) stage 3, GFR 30-59 ml/min (HCC) 05/28/2013  . Atrial fibrillation ([place]) 06/25/2012  . 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 RESTRICTIVE LUNG DISEASE 01/12/2011  . ERECTILE DYSFUNCTION 01/06/2008  . ESSENTIAL HYPERTENSION, BENIGN 01/05/2008  . Venous (peripheral) insufficiency 01/05/2008   Kerin Perna, PTA 09/18/18 12:01 PM    Laureen Abrahams, PT, DPT 09/18/18 1:32 PM    [facility] [place], [place], [address] Phone: [phone]   Fax:  [phone]  Name: ACESON LABELL MRN: [id number] Date of Birth: [birth year]

## 2018-09-18 NOTE — Progress Notes (Signed)
Agree with  [place].

## 2018-09-22 ENCOUNTER — Encounter: Payer: Self-pay | Admitting: Physical Therapy

## 2018-09-22 ENCOUNTER — Ambulatory Visit (INDEPENDENT_AMBULATORY_CARE_PROVIDER_SITE_OTHER): Payer: Medicare HMO | Admitting: Physical Therapy

## 2018-09-22 DIAGNOSIS — M25661 Stiffness of right knee, not elsewhere classified: Secondary | ICD-10-CM | POA: Diagnosis not present

## 2018-09-22 DIAGNOSIS — M25561 Pain in right knee: Secondary | ICD-10-CM

## 2018-09-22 DIAGNOSIS — R2689 Other abnormalities of gait and mobility: Secondary | ICD-10-CM

## 2018-09-22 NOTE — Therapy (Signed)
[place], [place], [address] Phone: [phone]   Fax:  [phone]  Physical Therapy Treatment  Patient Details  Name: Jerome Hicks MRN: [id number] Date of Birth: Mar 06, 1951 Referring Provider (PT): Dorna Leitz, MD   Encounter Date: 09/22/2018  PT End of Session - 09/22/18 0940    Visit Number  11    Number of Visits  16    Date for PT Re-Evaluation  10/15/18    Authorization Type  [employer]    PT Start Time  0930    PT Stop Time  1020   MHP/ice last 10 min    PT Time Calculation (min)  50 min    Activity Tolerance  Patient tolerated treatment well    Behavior During Therapy  [facility] [place] for tasks assessed/performed       Past Medical History:  Diagnosis Date  . Arthritis    oa  . 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 Ascending aortic aneurysm ([place])    seen on 01-31-18 ct chest ; patient denies awareness  . Atrial fibrillation ([place])   . Cataracts, bilateral   . ED (erectile dysfunction)    has been prescribed Levitra and Viagrain in the past  . Hypertension   . Internal hemorrhoids   . Kidney stones   . Leg edema    recurrent cellulitis  . Scrotal abscess 3-03  . Shoulder arthritis   . Sigmoid diverticulosis     Past Surgical History:  Procedure Laterality Date  .  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 CHOLECYSTECTOMY  1971  . kidney stones  1990's   Ureteroscopic stone extraction  . TONSILLECTOMY    . 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   Procedure: RIGHT TOTAL KNEE ARTHROPLASTY;  Surgeon: Dorna Leitz, MD;  Location: [facility];  Service: Orthopedics;  Laterality: Right;    There were no vitals filed for this visit.  Subjective Assessment - 09/22/18 0941    Subjective  "it (Rt knee) feels better every day".     Patient Stated Goals  improve mobility    Currently in Pain?  No/denies    Pain Score  0-No pain         [facility] PT Assessment -  09/22/18 0001      Assessment   Medical Diagnosis  Rt TKA    Referring Provider (PT)  Dorna Leitz, MD    Onset Date/Surgical Date  08/01/18    Next MD Visit  09/29/18        [facility] Adult PT Treatment/Exercise - 09/22/18 0001      Exercises   Exercises  Knee/Hip      Knee/Hip Exercises: Stretches   Passive Hamstring Stretch  Right;2 reps;30 seconds   foot on 13" step   Quad Stretch  Right;2 reps;30 seconds   prone with strap   Gastroc Stretch  Right;Left;2 reps;30 seconds      Knee/Hip Exercises: Aerobic   Stationary Bike  full slow revolutions (not fast enough to turn bike on) for ROM x 5     Other Aerobic  laps in between exercises to decrease stiffness.       Knee/Hip Exercises: Standing   Terminal Knee Extension  Right;2 sets;10 reps;Theraband    Theraband Level (Terminal Knee Extension)  Level 3 (Green)    Forward Step Up  Right;Step Height: 6";Hand Hold: 1;15 reps   improved form   Wall Squat  1 set;10  reps;3 seconds   with focus on TKE upon return     Knee/Hip Exercises: Prone   Hamstring Curl  5 reps;2 sets    Prone Knee Hang  --   30 seconds, 2 reps      Modalities   Modalities  Moist Heat;Cryotherapy      Moist Heat Therapy   Number Minutes Moist Heat  10 Minutes    Moist Heat Location  Knee   posterior Rt     Cryotherapy   Number Minutes Cryotherapy  10 Minutes    Cryotherapy Location  Knee   ant Rt knee   Type of Cryotherapy  Ice pack      Manual Therapy   Soft tissue mobilization  IASTM and STM to Rt hamstring to decrease fascial tightness and improve ROM.     Passive ROM  Rt knee into extension               PT Short Term Goals - 08/27/18 1012      PT SHORT TERM GOAL #1   Title  improve Rt knee AROM 0-100 for improved function    Baseline  9/25: 8-107    Status  Partially Met      PT SHORT TERM GOAL #2   Title  amb > 150' with [place] modified independent for improved function    Status  Achieved        PT Long Term Goals -  09/18/18 1154      PT LONG TERM GOAL #1   Title  I with HEP    Time  4    Period  Weeks    Status  On-going      PT LONG TERM GOAL #2   Title  improve Rt knee AROM 0-110 for improved function and mobility    Time  4    Period  Weeks    Status  Partially Met      PT LONG TERM GOAL #3   Title  amb > 300' without device independently for improved functional mobility    Time  4    Period  Weeks    Status  Partially Met      PT LONG TERM GOAL #4   Title  improve FOTO to </= 40% limitation for improved function    Time  4    Period  Weeks    Status  On-going      PT LONG TERM GOAL #5   Title  report pain < 2/10 with mobility    Status  Partially Met            Plan - 09/22/18 1249    Clinical Impression Statement  Pt continues to be limited with Rt knee ext ROM.  Manual therapy and stretches assisted in further decreasing fascial tightness.  Encouraged pt to work on knee ext while in sitting position throughout day to assist in improved knee mobility.   Progressing towards goals.     Rehab Potential  Good    PT Frequency  2x / week    PT Duration  8 weeks    PT Treatment/Interventions  ADLs/Self Care Home Management;Cryotherapy;Electrical Stimulation;Ultrasound;Moist Heat;Gait training;Stair training;Functional mobility training;Therapeutic activities;Therapeutic exercise;Balance training;Neuromuscular re-education;Patient/family education;Manual techniques;Passive range of motion;Dry needling;Vasopneumatic Device;Taping    PT Next Visit Plan  continue progressive ROM, functional strengthening.     Consulted and Agree with Plan of Care  Patient       Patient will benefit from skilled therapeutic intervention  in order to improve the following deficits and impairments:  Abnormal gait, Increased fascial restricitons, Increased muscle spasms, Pain, Impaired flexibility, Decreased range of motion, Difficulty walking, Decreased balance, Decreased activity tolerance, Decreased  mobility, Decreased strength  Visit Diagnosis: Acute pain of right knee  Stiffness of right knee, not elsewhere classified  Other abnormalities of gait and mobility     Problem List Patient Active Problem List   Diagnosis Date Noted  . Total knee replacement status 08/02/2018  . 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 Primary osteoarthritis of right knee 10/21/2017  . BMI 37.0-37.9, adult 04/27/2016  . Glenohumeral arthritis of both shoulders 04/14/2015  . 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 Hypogonadism male 02/26/2014  . Lower leg edema 12/02/2013  . Lymph node enlargement 12/02/2013  . CKD (chronic kidney disease) stage 3, GFR 30-59 ml/min (HCC) 05/28/2013  . Atrial fibrillation ([place]) 06/25/2012  . 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 RESTRICTIVE LUNG DISEASE 01/12/2011  . ERECTILE DYSFUNCTION 01/06/2008  . ESSENTIAL HYPERTENSION, BENIGN 01/05/2008  . Venous (peripheral) insufficiency 01/05/2008   Kerin Perna, PTA 09/22/18 12:51 PM [facility] [place], [place], [address] Phone: [phone]   Fax:  [phone]  Name: Jerome Hicks MRN: [id number] Date of Birth: 04/21/1951

## 2018-09-25 ENCOUNTER — Ambulatory Visit (INDEPENDENT_AMBULATORY_CARE_PROVIDER_SITE_OTHER): Payer: Medicare HMO | Admitting: Physical Therapy

## 2018-09-25 ENCOUNTER — Encounter: Payer: Self-pay | Admitting: Physical Therapy

## 2018-09-25 DIAGNOSIS — R2689 Other abnormalities of gait and mobility: Secondary | ICD-10-CM

## 2018-09-25 DIAGNOSIS — M25561 Pain in right knee: Secondary | ICD-10-CM | POA: Diagnosis not present

## 2018-09-25 DIAGNOSIS — M25661 Stiffness of right knee, not elsewhere classified: Secondary | ICD-10-CM

## 2018-09-25 NOTE — Therapy (Signed)
[facility]  [address] [place], [place], [address] Phone: [phone]   Fax:  [phone]  Physical Therapy Treatment  Patient Details  Name: Jerome Hicks MRN: [id number] Date of Birth: 06/03/1951 Referring Provider (PT): John Graves, MD   Encounter Date: 09/25/2018  PT End of Session - 09/25/18 1017    Visit Number  12    Number of Visits  16    Date for PT Re-Evaluation  10/15/18    Authorization Type  [employer]    PT Start Time  0931    PT Stop Time  1020    PT Time Calculation (min)  49 min    Activity Tolerance  Patient tolerated treatment well    Behavior During Therapy  [facility] for tasks assessed/performed       Past Medical History:  Diagnosis Date  . Arthritis    oa  . 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 Ascending aortic aneurysm ([facility])    seen on 01-31-18 ct chest ; patient denies awareness  . Atrial fibrillation ([facility])   . Cataracts, bilateral   . ED (erectile dysfunction)    has been prescribed Levitra and Viagrain in the past  . Hypertension   . Internal hemorrhoids   . Kidney stones   . Leg edema    recurrent cellulitis  . Scrotal abscess 3-03  . Shoulder arthritis   . Sigmoid diverticulosis     Past Surgical History:  Procedure Laterality Date  .  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 CHOLECYSTECTOMY  1971  . kidney stones  1990's   Ureteroscopic stone extraction  . TONSILLECTOMY    . 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   Procedure: RIGHT TOTAL KNEE ARTHROPLASTY;  Surgeon: Graves, John, MD;  Location[facility];  Service: Orthopedics;  Laterality: Right;    There were no vitals filed for this visit.  Subjective Assessment - 09/25/18 0936    Subjective  concerned about pain and soreness at Lt knee.    Patient Stated Goals  improve mobility    Currently in Pain?  Yes    Pain Score  2     Pain Location  Knee    Pain Orientation  Right;Medial;Lateral     Pain Descriptors / Indicators  Sore    Pain Type  Surgical pain    Pain Onset  More than a month ago    Pain Frequency  Intermittent    Aggravating Factors   bending, working on extension    Pain Relieving Factors  meds, ice         [facility] PT Assessment - 09/25/18 1004      Assessment   Medical Diagnosis  Rt TKA    Referring Provider (PT)  John Graves, MD    Onset Date/Surgical Date  08/01/18    Next MD Visit  09/29/18      Observation/Other Assessments   Focus on Therapeutic Outcomes (FOTO)   55 (45% limitation)      AROM   Right Knee Extension  -8    Right Knee Flexion  105      PROM   Right Knee Extension  -3    Right Knee Flexion  110                   [facility] Adult PT Treatment/Exercise - 09/25/18 0938      Knee/Hip Exercises: Stretches   Passive Hamstring Stretch  Right;2   reps;30 seconds   foot on 13" step     Knee/Hip Exercises: Aerobic   Stationary Bike  partial/full slow revolutions (not fast enough to turn bike on) for ROM x 5       Knee/Hip Exercises: Standing   Other Standing Knee Exercises  forward lunge with foot on 13" step to increase Rt knee flexion x 5-10 sec hold, x 10 reps;        Vasopneumatic   Number Minutes Vasopneumatic   10 minutes    Vasopnuematic Location   Knee    Vasopneumatic Pressure  Medium    Vasopneumatic Temperature   34 deg      Manual Therapy   Soft tissue mobilization  IASTM and STM to Rt hamstring to decrease fascial tightness and improve ROM.     Passive ROM  Rt knee into extension               PT Short Term Goals - 08/27/18 1012      PT SHORT TERM GOAL #1   Title  improve Rt knee AROM 0-100 for improved function    Baseline  9/25: 8-107    Status  Partially Met      PT SHORT TERM GOAL #2   Title  amb > 150' with SPC modified independent for improved function    Status  Achieved        PT Long Term Goals - 09/18/18 1154      PT LONG TERM GOAL #1   Title  I with HEP    Time  4     Period  Weeks    Status  On-going      PT LONG TERM GOAL #2   Title  improve Rt knee AROM 0-110 for improved function and mobility    Time  4    Period  Weeks    Status  Partially Met      PT LONG TERM GOAL #3   Title  amb > 300' without device independently for improved functional mobility    Time  4    Period  Weeks    Status  Partially Met      PT LONG TERM GOAL #4   Title  improve FOTO to </= 40% limitation for improved function    Time  4    Period  Weeks    Status  On-going      PT LONG TERM GOAL #5   Title  report pain < 2/10 with mobility    Status  Partially Met            Plan - 09/25/18 1018    Clinical Impression Statement  Pt progressing well with PT and demonstrating improved functional mobility.  ROM still limited especially with extension but edema still present in knee.  Will continue to benefit from PT to maximize function.  Anticipate d/c next 1-2 weeks.    Rehab Potential  Good    PT Frequency  2x / week    PT Duration  8 weeks    PT Treatment/Interventions  ADLs/Self Care Home Management;Cryotherapy;Electrical Stimulation;Ultrasound;Moist Heat;Gait training;Stair training;Functional mobility training;Therapeutic activities;Therapeutic exercise;Balance training;Neuromuscular re-education;Patient/family education;Manual techniques;Passive range of motion;Dry needling;Vasopneumatic Device;Taping    PT Next Visit Plan  continue progressive ROM, functional strengthening.     PT Home Exercise Plan  [phone]    Consulted and Agree with Plan of Care  Patient       Patient will benefit from skilled therapeutic intervention in order  to improve the following deficits and impairments:  Abnormal gait, Increased fascial restricitons, Increased muscle spasms, Pain, Impaired flexibility, Decreased range of motion, Difficulty walking, Decreased balance, Decreased activity tolerance, Decreased mobility, Decreased strength  Visit Diagnosis: Acute pain of right  knee  Stiffness of right knee, not elsewhere classified  Other abnormalities of gait and mobility     Problem List Patient Active Problem List   Diagnosis Date Noted  . Total knee replacement status 08/02/2018  . Thoracic aortic ectasia ([facility]) 02/06/2018  . Primary osteoarthritis of right knee 10/21/2017  . BMI 37.0-37.9, adult 04/27/2016  . Glenohumeral arthritis of both shoulders 04/14/2015  . 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 Hypogonadism male 02/26/2014  . Lower leg edema 12/02/2013  . Lymph node enlargement 12/02/2013  . CKD (chronic kidney disease) stage 3, GFR 30-59 ml/min (HCC) 05/28/2013  . Atrial fibrillation ([facility]) 06/25/2012  . 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 RESTRICTIVE LUNG DISEASE 01/12/2011  . ERECTILE DYSFUNCTION 01/06/2008  . ESSENTIAL HYPERTENSION, BENIGN 01/05/2008  . Venous (peripheral) insufficiency 01/05/2008       F , PT, DPT 09/25/18 10:20 AM       [facility] [address] [place], [place], [address] Phone: [phone]   Fax:  [phone]  Name: W[place]ler L Aden MRN: [id number] Date of Birth: 07/25/1951   

## 2018-09-29 ENCOUNTER — Encounter: Payer: Self-pay | Admitting: Physical Therapy

## 2018-09-29 ENCOUNTER — Ambulatory Visit (INDEPENDENT_AMBULATORY_CARE_PROVIDER_SITE_OTHER): Payer: Medicare HMO | Admitting: Physical Therapy

## 2018-09-29 ENCOUNTER — Encounter: Payer: Medicare HMO | Admitting: Physical Therapy

## 2018-09-29 DIAGNOSIS — R2689 Other abnormalities of gait and mobility: Secondary | ICD-10-CM

## 2018-09-29 DIAGNOSIS — M25561 Pain in right knee: Secondary | ICD-10-CM | POA: Diagnosis not present

## 2018-09-29 DIAGNOSIS — M25661 Stiffness of right knee, not elsewhere classified: Secondary | ICD-10-CM

## 2018-09-29 DIAGNOSIS — Z9889 Other specified postprocedural states: Secondary | ICD-10-CM | POA: Diagnosis not present

## 2018-09-29 NOTE — Patient Instructions (Signed)
   Cardio Equipment (Goal is 30 minutes)  1. Recumbent Bike  2. Seated Stepper      Machines for Strengthening (Goal is 2-3 sets of 10-15 reps)  1. Knee Extension  2. Hamstring Curls  3. Hip Abduction  4. Hip Adduction  5. Hip Extension (may not have)  6. Leg Press

## 2018-09-29 NOTE — Therapy (Signed)
[place], [place], [address] Phone: [phone]   Fax:  [phone]  Physical Therapy Treatment  Patient Details  Name: Jerome Hicks MRN: [id number] Date of Birth: 13-May-1951 Referring Provider (PT): Dorna Leitz, MD   Encounter Date: 09/29/2018  PT End of Session - 09/29/18 1432    Visit Number  13    Number of Visits  16    Date for PT Re-Evaluation  10/15/18    Authorization Type  [employer]    PT Start Time  8588    PT Stop Time  1431    PT Time Calculation (min)  36 min    Activity Tolerance  Patient tolerated treatment well    Behavior During Therapy  [facility] for tasks assessed/performed       Past Medical History:  Diagnosis Date  . Arthritis    oa  . 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 Ascending aortic aneurysm ([place])    seen on 01-31-18 ct chest ; patient denies awareness  . Atrial fibrillation ([place])   . Cataracts, bilateral   . ED (erectile dysfunction)    has been prescribed Levitra and Viagrain in the past  . Hypertension   . Internal hemorrhoids   . Kidney stones   . Leg edema    recurrent cellulitis  . Scrotal abscess 3-03  . Shoulder arthritis   . Sigmoid diverticulosis     Past Surgical History:  Procedure Laterality Date  .  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 CHOLECYSTECTOMY  1971  . kidney stones  1990's   Ureteroscopic stone extraction  . TONSILLECTOMY    . 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   Procedure: RIGHT TOTAL KNEE ARTHROPLASTY;  Surgeon: Dorna Leitz, MD;  Location[facility];  Service: Orthopedics;  Laterality: Right;    There were no vitals filed for this visit.  Subjective Assessment - 09/29/18 1401    Subjective  MD pleased with progress; up to PT and pt to determine discharge.    Patient Stated Goals  improve mobility    Pain Onset  More than a month ago                       [facility] Adult PT  Treatment/Exercise - 09/29/18 1404      Self-Care   Self-Care  Other Self-Care Comments    Other Self-Care Comments   educated on gym/fitness center equipment recommendations.      Knee/Hip Exercises: Stretches   Passive Hamstring Stretch  Right;3 reps;30 seconds   supine and seated; with quad activation;      Knee/Hip Exercises: Aerobic   Stationary Bike  partial/full slow revolutions (not fast enough to turn bike on) for ROM x 5       Knee/Hip Exercises: Standing   Terminal Knee Extension  Right;2 sets;10 reps;Theraband    Theraband Level (Terminal Knee Extension)  Level 4 (Blue)      Manual Therapy   Passive ROM  Rt knee into extension               PT Short Term Goals - 08/27/18 1012      PT SHORT TERM GOAL #1   Title  improve Rt knee AROM 0-100 for improved function    Baseline  9/25: 8-107    Status  Partially Met      PT SHORT TERM GOAL #2   Title  amb > 150' with SPC modified independent for improved function    Status  Achieved        PT Long Term Goals - 09/18/18 1154      PT LONG TERM GOAL #1   Title  I with HEP    Time  4    Period  Weeks    Status  On-going      PT LONG TERM GOAL #2   Title  improve Rt knee AROM 0-110 for improved function and mobility    Time  4    Period  Weeks    Status  Partially Met      PT LONG TERM GOAL #3   Title  amb > 300' without device independently for improved functional mobility    Time  4    Period  Weeks    Status  Partially Met      PT LONG TERM GOAL #4   Title  improve FOTO to </= 40% limitation for improved function    Time  4    Period  Weeks    Status  On-going      PT LONG TERM GOAL #5   Title  report pain < 2/10 with mobility    Status  Partially Met            Plan - 09/29/18 1432    Clinical Impression Statement  Pt  doing well and MD pleased with progress.  Plan to see 2x/wk x 2 more weeks then reassess.  Need to focus on extension.    Rehab Potential  Good    PT Frequency  2x  / week    PT Duration  8 weeks    PT Treatment/Interventions  ADLs/Self Care Home Management;Cryotherapy;Electrical Stimulation;Ultrasound;Moist Heat;Gait training;Stair training;Functional mobility training;Therapeutic activities;Therapeutic exercise;Balance training;Neuromuscular re-education;Patient/family education;Manual techniques;Passive range of motion;Dry needling;Vasopneumatic Device;Taping    PT Next Visit Plan  continue progressive ROM, functional strengthening.     PT Home Exercise Plan  [phone]    Consulted and Agree with Plan of Care  Patient       Patient will benefit from skilled therapeutic intervention in order to improve the following deficits and impairments:  Abnormal gait, Increased fascial restricitons, Increased muscle spasms, Pain, Impaired flexibility, Decreased range of motion, Difficulty walking, Decreased balance, Decreased activity tolerance, Decreased mobility, Decreased strength  Visit Diagnosis: Acute pain of right knee  Stiffness of right knee, not elsewhere classified  Other abnormalities of gait and mobility     Problem List Patient Active Problem List   Diagnosis Date Noted  . Total knee replacement status 08/02/2018  . 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) 02/06/2018  . Primary osteoarthritis of right knee 10/21/2017  . BMI 37.0-37.9, adult 04/27/2016  . Glenohumeral arthritis of both shoulders 04/14/2015  . 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 Hypogonadism male 02/26/2014  . Lower leg edema 12/02/2013  . Lymph node enlargement 12/02/2013  . CKD (chronic kidney disease) stage 3, GFR 30-59 ml/min (HCC) 05/28/2013  . Atrial fibrillation ([place]) 06/25/2012  . 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 RESTRICTIVE LUNG DISEASE 01/12/2011  . ERECTILE DYSFUNCTION 01/06/2008  . ESSENTIAL HYPERTENSION, BENIGN 01/05/2008  . Venous (peripheral) insufficiency 01/05/2008      Laureen Abrahams, PT, DPT 09/29/18 3:19 PM    [facility] [place], [place], [address] Phone: [phone]   Fax:  [phone]  Name: Jerome Hicks MRN: [id number] Date of Birth: 14-Nov-1951

## 2018-10-02 ENCOUNTER — Encounter: Payer: Self-pay | Admitting: Physical Therapy

## 2018-10-02 ENCOUNTER — Ambulatory Visit (INDEPENDENT_AMBULATORY_CARE_PROVIDER_SITE_OTHER): Payer: Medicare HMO | Admitting: Physical Therapy

## 2018-10-02 DIAGNOSIS — M25661 Stiffness of right knee, not elsewhere classified: Secondary | ICD-10-CM

## 2018-10-02 DIAGNOSIS — M25561 Pain in right knee: Secondary | ICD-10-CM

## 2018-10-02 DIAGNOSIS — R2689 Other abnormalities of gait and mobility: Secondary | ICD-10-CM | POA: Diagnosis not present

## 2018-10-02 NOTE — Therapy (Signed)
[place], [place], [address] Phone: [phone]   Fax:  [phone]  Physical Therapy Treatment  Patient Details  Name: Jerome Hicks MRN: [id number] Date of Birth: 03-11-1951 Referring Provider (PT): Dorna Leitz, MD   Encounter Date: 10/02/2018  PT End of Session - 10/02/18 1014    Visit Number  14    Number of Visits  16    Date for PT Re-Evaluation  10/15/18    Authorization Type  [employer]    PT Start Time  0930    PT Stop Time  1020    PT Time Calculation (min)  50 min    Activity Tolerance  Patient tolerated treatment well    Behavior During Therapy  [facility] for tasks assessed/performed       Past Medical History:  Diagnosis Date  . Arthritis    oa  . 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 Ascending aortic aneurysm ([place])    seen on 01-31-18 ct chest ; patient denies awareness  . Atrial fibrillation ([place])   . Cataracts, bilateral   . ED (erectile dysfunction)    has been prescribed Levitra and Viagrain in the past  . Hypertension   . Internal hemorrhoids   . Kidney stones   . Leg edema    recurrent cellulitis  . Scrotal abscess 3-03  . Shoulder arthritis   . Sigmoid diverticulosis     Past Surgical History:  Procedure Laterality Date  .  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 CHOLECYSTECTOMY  1971  . kidney stones  1990's   Ureteroscopic stone extraction  . TONSILLECTOMY    . 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   Procedure: RIGHT TOTAL KNEE ARTHROPLASTY;  Surgeon: Dorna Leitz, MD;  Location: [facility];  Service: Orthopedics;  Laterality: Right;    There were no vitals filed for this visit.  Subjective Assessment - 10/02/18 1018    Subjective  "My knee is just sore."  Pt reports the weather is affecting his joints, especially Rt knee.      Patient is accompained by:  Family member    Currently in Pain?  Yes    Pain Score  3     Pain  Location  Knee    Pain Orientation  Right;Lateral;Medial    Aggravating Factors   bending, working on knee extension     Pain Relieving Factors  meds, ice          [facility] PT Assessment - 10/02/18 0001      Assessment   Medical Diagnosis  Rt TKA    Referring Provider (PT)  Dorna Leitz, MD    Onset Date/Surgical Date  08/01/18    Next MD Visit  3 months.       AROM   Right Knee Extension  -12    Right Knee Flexion  105        [facility] Adult PT Treatment/Exercise - 10/02/18 0001      Knee/Hip Exercises: Stretches   Passive Hamstring Stretch  Right;3 reps;30 seconds   supine and seated; with quad activation;    Gastroc Stretch  Right;Left;3 reps;30 seconds   heels off of step     Knee/Hip Exercises: Aerobic   Stationary Bike  partial revolutions forward /full slow revolutions backward  (not fast enough to turn bike on) for ROM x 5     Other Aerobic  laps in between exercises to decrease  stiffness.       Knee/Hip Exercises: Standing   Other Standing Knee Exercises  forward lunge with foot on 13" step to increase Rt knee flexion x 5-10 sec hold, x 8 reps - with also rocking back into hamstring stretch each rep.   Rt foot on low black mat table to forward lunge for Rt knee flexion ROM x 5 reps      Vasopneumatic   Number Minutes Vasopneumatic   10 minutes    Vasopnuematic Location   Knee    Vasopneumatic Pressure  Low    Vasopneumatic Temperature   34 deg      Manual Therapy   Soft tissue mobilization  STM to Rt hamstring to decrease fascial tightness and improve ROM.     Passive ROM  Rt knee into extension, multiple reps       Kinesiotix   Edema  squid shaped big daddy rock tape to anterior Rt knee with 10% stretch to decompress tissue, decrease pain and edema.               PT Short Term Goals - 08/27/18 1012      PT SHORT TERM GOAL #1   Title  improve Rt knee AROM 0-100 for improved function    Baseline  9/25: 8-107    Status  Partially Met      PT SHORT  TERM GOAL #2   Title  amb > 150' with SPC modified independent for improved function    Status  Achieved        PT Long Term Goals - 09/18/18 1154      PT LONG TERM GOAL #1   Title  I with HEP    Time  4    Period  Weeks    Status  On-going      PT LONG TERM GOAL #2   Title  improve Rt knee AROM 0-110 for improved function and mobility    Time  4    Period  Weeks    Status  Partially Met      PT LONG TERM GOAL #3   Title  amb > 300' without device independently for improved functional mobility    Time  4    Period  Weeks    Status  Partially Met      PT LONG TERM GOAL #4   Title  improve FOTO to </= 40% limitation for improved function    Time  4    Period  Weeks    Status  On-going      PT LONG TERM GOAL #5   Title  report pain < 2/10 with mobility    Status  Partially Met            Plan - 10/02/18 1015    Clinical Impression Statement  Pt presents with increased swelling in RLE and knee, limiting further advancements of ROM.  Rt knee ROM remains similar to last assessment.  Pt tolerated all stretches well, reporting mild increase in discomfort with ext.  Pt making gradual progress towards remaining goals.     Rehab Potential  Good    PT Frequency  2x / week    PT Duration  8 weeks    PT Treatment/Interventions  ADLs/Self Care Home Management;Cryotherapy;Electrical Stimulation;Ultrasound;Moist Heat;Gait training;Stair training;Functional mobility training;Therapeutic activities;Therapeutic exercise;Balance training;Neuromuscular re-education;Patient/family education;Manual techniques;Passive range of motion;Dry needling;Vasopneumatic Device;Taping    PT Next Visit Plan  continue progressive ROM, with focus on extension, functional strengthening.  Consulted and Agree with Plan of Care  Patient;Family member/caregiver    Family Member Consulted  wife       Patient will benefit from skilled therapeutic intervention in order to improve the following deficits  and impairments:  Abnormal gait, Increased fascial restricitons, Increased muscle spasms, Pain, Impaired flexibility, Decreased range of motion, Difficulty walking, Decreased balance, Decreased activity tolerance, Decreased mobility, Decreased strength  Visit Diagnosis: Acute pain of right knee  Stiffness of right knee, not elsewhere classified  Other abnormalities of gait and mobility     Problem List Patient Active Problem List   Diagnosis Date Noted  . Total knee replacement status 08/02/2018  . 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 Primary osteoarthritis of right knee 10/21/2017  . BMI 37.0-37.9, adult 04/27/2016  . Glenohumeral arthritis of both shoulders 04/14/2015  . 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 Hypogonadism male 02/26/2014  . Lower leg edema 12/02/2013  . Lymph node enlargement 12/02/2013  . CKD (chronic kidney disease) stage 3, GFR 30-59 ml/min (HCC) 05/28/2013  . Atrial fibrillation ([place]) 06/25/2012  . 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 RESTRICTIVE LUNG DISEASE 01/12/2011  . ERECTILE DYSFUNCTION 01/06/2008  . ESSENTIAL HYPERTENSION, BENIGN 01/05/2008  . Venous (peripheral) insufficiency 01/05/2008   Kerin Perna, PTA 10/02/18 10:51 AM  [facility] [place], [place], [address] Phone: [phone]   Fax:  [phone]  Name: Jerome Hicks MRN: [id number] Date of Birth: Aug 13, 1951

## 2018-10-06 ENCOUNTER — Ambulatory Visit (INDEPENDENT_AMBULATORY_CARE_PROVIDER_SITE_OTHER): Payer: Medicare HMO | Admitting: Physical Therapy

## 2018-10-06 ENCOUNTER — Ambulatory Visit: Payer: Medicare HMO | Admitting: Physician Assistant

## 2018-10-06 ENCOUNTER — Ambulatory Visit (INDEPENDENT_AMBULATORY_CARE_PROVIDER_SITE_OTHER): Payer: Medicare HMO | Admitting: Family Medicine

## 2018-10-06 ENCOUNTER — Encounter: Payer: Self-pay | Admitting: Physical Therapy

## 2018-10-06 ENCOUNTER — Encounter: Payer: Self-pay | Admitting: Family Medicine

## 2018-10-06 VITALS — BP 133/80 | HR 80 | Ht 74.0 in | Wt 290.0 lb

## 2018-10-06 DIAGNOSIS — M7989 Other specified soft tissue disorders: Secondary | ICD-10-CM

## 2018-10-06 DIAGNOSIS — I4891 Unspecified atrial fibrillation: Secondary | ICD-10-CM | POA: Diagnosis not present

## 2018-10-06 DIAGNOSIS — R2689 Other abnormalities of gait and mobility: Secondary | ICD-10-CM | POA: Diagnosis not present

## 2018-10-06 DIAGNOSIS — N183 Chronic kidney disease, stage 3 unspecified: Secondary | ICD-10-CM

## 2018-10-06 DIAGNOSIS — M25661 Stiffness of right knee, not elsewhere classified: Secondary | ICD-10-CM

## 2018-10-06 DIAGNOSIS — M25561 Pain in right knee: Secondary | ICD-10-CM

## 2018-10-06 DIAGNOSIS — I10 Essential (primary) hypertension: Secondary | ICD-10-CM | POA: Diagnosis not present

## 2018-10-06 DIAGNOSIS — R7309 Other abnormal glucose: Secondary | ICD-10-CM | POA: Diagnosis not present

## 2018-10-06 LAB — POCT INR: INR: 2.9 (ref 2.0–3.0)

## 2018-10-06 LAB — POCT GLYCOSYLATED HEMOGLOBIN (HGB A1C): Hemoglobin A1C: 4.9 % (ref 4.0–5.6)

## 2018-10-06 NOTE — Progress Notes (Signed)
Pt here for INR check no missed doses, diet changes,bruising,bleeding,CP,SOB, taking 5 mg m,w,f and 2.5 all other days.Elouise Munroe, [place]

## 2018-10-06 NOTE — Therapy (Signed)
[place], [place], [address] Phone: [phone]   Fax:  [phone]  Physical Therapy Treatment  Patient Details  Name: Jerome Hicks MRN: [id number] Date of Birth: 1951-02-26 Referring Provider (PT): Dorna Leitz, MD   Encounter Date: 10/06/2018  PT End of Session - 10/06/18 1017    Visit Number  15    Number of Visits  16    Date for PT Re-Evaluation  10/15/18    Authorization Type  [employer]    PT Start Time  0932    PT Stop Time  1017    PT Time Calculation (min)  45 min    Activity Tolerance  Patient tolerated treatment well    Behavior During Therapy  [facility] for tasks assessed/performed       Past Medical History:  Diagnosis Date  . Arthritis    oa  . 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 Ascending aortic aneurysm ([place])    seen on 01-31-18 ct chest ; patient denies awareness  . Atrial fibrillation ([place])   . Cataracts, bilateral   . ED (erectile dysfunction)    has been prescribed Levitra and Viagrain in the past  . Hypertension   . Internal hemorrhoids   . Kidney stones   . Leg edema    recurrent cellulitis  . Scrotal abscess 3-03  . Shoulder arthritis   . Sigmoid diverticulosis     Past Surgical History:  Procedure Laterality Date  .  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 CHOLECYSTECTOMY  1971  . kidney stones  1990's   Ureteroscopic stone extraction  . TONSILLECTOMY    . 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   Procedure: RIGHT TOTAL KNEE ARTHROPLASTY;  Surgeon: Dorna Leitz, MD;  [facility];  Service: Orthopedics;  Laterality: Right;    There were no vitals filed for this visit.  Subjective Assessment - 10/06/18 0936    Subjective  knee doing well; still a little sore    Patient Stated Goals  improve mobility    Currently in Pain?  Yes    Pain Score  3     Pain Location  Knee    Pain Orientation  Right;Lateral;Medial    Pain  Descriptors / Indicators  Sore    Pain Onset  More than a month ago    Pain Frequency  Intermittent    Aggravating Factors   bending, working on knee extension    Pain Relieving Factors  med, ice                       [facility] Adult PT Treatment/Exercise - 10/06/18 0938      Knee/Hip Exercises: Stretches   Gastroc Stretch  Right;Left;3 reps;30 seconds      Knee/Hip Exercises: Aerobic   Stationary Bike  partial revolutions forward /full slow revolutions backward  (not fast enough to turn bike on) for ROM x 5     Other Aerobic  laps in between exercises to decrease stiffness.       Knee/Hip Exercises: Standing   Heel Raises  Both;20 reps      Vasopneumatic   Number Minutes Vasopneumatic   10 minutes    Vasopnuematic Location   Knee    Vasopneumatic Pressure  Low    Vasopneumatic Temperature   34 deg      Manual Therapy   Soft tissue mobilization  STM to Rt  hamstring to decrease fascial tightness and improve ROM.     Passive ROM  Rt knee into extension, multiple reps                PT Short Term Goals - 08/27/18 1012      PT SHORT TERM GOAL #1   Title  improve Rt knee AROM 0-100 for improved function    Baseline  9/25: 8-107    Status  Partially Met      PT SHORT TERM GOAL #2   Title  amb > 150' with SPC modified independent for improved function    Status  Achieved        PT Long Term Goals - 09/18/18 1154      PT LONG TERM GOAL #1   Title  I with HEP    Time  4    Period  Weeks    Status  On-going      PT LONG TERM GOAL #2   Title  improve Rt knee AROM 0-110 for improved function and mobility    Time  4    Period  Weeks    Status  Partially Met      PT LONG TERM GOAL #3   Title  amb > 300' without device independently for improved functional mobility    Time  4    Period  Weeks    Status  Partially Met      PT LONG TERM GOAL #4   Title  improve FOTO to </= 40% limitation for improved function    Time  4    Period  Weeks     Status  On-going      PT LONG TERM GOAL #5   Title  report pain < 2/10 with mobility    Status  Partially Met            Plan - 10/06/18 1018    Clinical Impression Statement  Pt tolerated session well today with focus on extension and demonstrated improved gait with terminal knee extension and heel strike today after manual.  Progressing well and anticipate d/c to HEP next 1-2 visits.    Rehab Potential  Good    PT Frequency  2x / week    PT Duration  8 weeks    PT Treatment/Interventions  ADLs/Self Care Home Management;Cryotherapy;Electrical Stimulation;Ultrasound;Moist Heat;Gait training;Stair training;Functional mobility training;Therapeutic activities;Therapeutic exercise;Balance training;Neuromuscular re-education;Patient/family education;Manual techniques;Passive range of motion;Dry needling;Vasopneumatic Device;Taping    PT Next Visit Plan  continue progressive ROM, with focus on extension, functional strengthening.     PT Home Exercise Plan  [id number]    Consulted and Agree with Plan of Care  Patient;Family member/caregiver    Family Member Consulted  wife       Patient will benefit from skilled therapeutic intervention in order to improve the following deficits and impairments:  Abnormal gait, Increased fascial restricitons, Increased muscle spasms, Pain, Impaired flexibility, Decreased range of motion, Difficulty walking, Decreased balance, Decreased activity tolerance, Decreased mobility, Decreased strength  Visit Diagnosis: Acute pain of right knee  Stiffness of right knee, not elsewhere classified  Other abnormalities of gait and mobility     Problem List Patient Active Problem List   Diagnosis Date Noted  . Total knee replacement status 08/02/2018  . 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 Primary osteoarthritis of right knee 10/21/2017  . BMI 37.0-37.9, adult 04/27/2016  . Glenohumeral arthritis of both shoulders 04/14/2015  . Gilbert's disease  08/24/2014  . Hypogonadism  male 02/26/2014  . Lower leg edema 12/02/2013  . Lymph node enlargement 12/02/2013  . CKD (chronic kidney disease) stage 3, GFR 30-59 ml/min (HCC) 05/28/2013  . Atrial fibrillation ([place]) 06/25/2012  . 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 RESTRICTIVE LUNG DISEASE 01/12/2011  . ERECTILE DYSFUNCTION 01/06/2008  . ESSENTIAL HYPERTENSION, BENIGN 01/05/2008  . Venous (peripheral) insufficiency 01/05/2008       Laureen Abrahams, PT, DPT 10/06/18 10:19 AM    [facility] [place], [place], [address] Phone: [phone]   Fax:  [phone]  Name: Jerome Hicks MRN: [id number] Date of Birth: 05/10/1951

## 2018-10-06 NOTE — Progress Notes (Signed)
Subjective:    CC:   HPI:  Hypertension- Pt denies chest pain, SOB, dizziness, or heart palpitations.  Taking meds as directed w/o problems.  Denies medication side effects.    F/U CKD 3 - Stable no changes  Afib -he is doing well overall no problems bleeding.  So recovering from his knee surgery on the right.  He has about 4 more sessions of PT and that he will be complete and plans on joining the gym after that.  He plans on getting into some regular exercise and trying to eat more healthy.  Having some chronic swelling in that right knee and into his right ankle.  But the surgeon said it may take up to 6 months.  He also let me know that he did get his second shingles vaccine and he is completed his series.  Past medical history, Surgical history, Family history not pertinant except as noted below, Social history, Allergies, and medications have been entered into the medical record, reviewed, and corrections made.   Review of Systems: No fevers, chills, night sweats, weight loss, chest pain, or shortness of breath.   Objective:    General: Well Developed, well nourished, and in no acute distress.  Neuro: Alert and oriented x3, extra-ocular muscles intact, sensation grossly intact.  HEENT: Normocephalic, atraumatic  Skin: Warm and dry, no rashes. Cardiac: Regular rate and rhythm, no murmurs rubs or gallops, no lower extremity edema.  Respiratory: Clear to auscultation bilaterally. Not using accessory muscles, speaking in full sentences.   Impression and Recommendations:    HTN - Well controlled.  Continue with half a tab of lisinopril.  Since his blood pressures overall still look good.  Plans on getting back into the gym and starting to exercise and lose weight and eat more healthy.  Continue current regimen. Follow up in  6 months.    CKD 3  - last cr stable.    Afib - see coumadin flowsheet.  No change to Coumadin level today.  Borderline low so we will repeat check in 2 to 3  weeks.  Abnormal glucose-hemoglobin A1 C4 0.9 which is in the normal range.  Right knee and leg swelling status post knee replacement.  Need to elevate and ice.  Complete PT and and he plans to start working out at the gym.

## 2018-10-09 ENCOUNTER — Ambulatory Visit (INDEPENDENT_AMBULATORY_CARE_PROVIDER_SITE_OTHER): Payer: Medicare HMO | Admitting: Physical Therapy

## 2018-10-09 ENCOUNTER — Encounter: Payer: Self-pay | Admitting: Physical Therapy

## 2018-10-09 DIAGNOSIS — M25661 Stiffness of right knee, not elsewhere classified: Secondary | ICD-10-CM | POA: Diagnosis not present

## 2018-10-09 DIAGNOSIS — R2689 Other abnormalities of gait and mobility: Secondary | ICD-10-CM

## 2018-10-09 DIAGNOSIS — M25561 Pain in right knee: Secondary | ICD-10-CM

## 2018-10-09 NOTE — Therapy (Signed)
[place], [place], [address] Phone: [phone]   Fax:  [phone]  Physical Therapy Treatment/Discharge  Patient Details  Name: Jerome Hicks MRN: [id number] Date of Birth: 1951-11-21 Referring Provider (PT): Dorna Leitz, MD   Encounter Date: 10/09/2018  PT End of Session - 10/09/18 1149    Visit Number  16    Date for PT Re-Evaluation  10/15/18    Authorization Type  [employer]    PT Start Time  2993    PT Stop Time  1238    PT Time Calculation (min)  53 min    Activity Tolerance  Patient tolerated treatment well    Behavior During Therapy  [facility] for tasks assessed/performed       Past Medical History:  Diagnosis Date  . Arthritis    oa  . 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 Ascending aortic aneurysm ([place])    seen on 01-31-18 ct chest ; patient denies awareness  . Atrial fibrillation ([place])   . Cataracts, bilateral   . ED (erectile dysfunction)    has been prescribed Levitra and Viagrain in the past  . Hypertension   . Internal hemorrhoids   . Kidney stones   . Leg edema    recurrent cellulitis  . Scrotal abscess 3-03  . Shoulder arthritis   . Sigmoid diverticulosis     Past Surgical History:  Procedure Laterality Date  .  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 CHOLECYSTECTOMY  1971  . kidney stones  1990's   Ureteroscopic stone extraction  . TONSILLECTOMY    . 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   Procedure: RIGHT TOTAL KNEE ARTHROPLASTY;  Surgeon: Dorna Leitz, MD;  [facility];  Service: Orthopedics;  Laterality: Right;    There were no vitals filed for this visit.  Subjective Assessment - 10/09/18 1150    Subjective  Pt reports a "knot came up" on the lateral part of thigh on his RLE yesterday. He noticed it when he was rubbing his leg after completing exercises.     Patient Stated Goals  improve mobility    Currently in Pain?   Yes    Pain Score  5     Pain Location  Knee    Pain Orientation  Right;Left    Pain Descriptors / Indicators  Sore    Aggravating Factors   working on extension    Pain Relieving Factors  medication, ice          [facility] PT Assessment - 10/09/18 0001      Assessment   Medical Diagnosis  Rt TKA    Referring Provider (PT)  Dorna Leitz, MD    Onset Date/Surgical Date  08/01/18    Next MD Visit  3 months.       Observation/Other Assessments   Focus on Therapeutic Outcomes (FOTO)   36% limited       AROM   Right Knee Extension  -10    Right Knee Flexion  108       [facility] Adult PT Treatment/Exercise - 10/09/18 0001      Knee/Hip Exercises: Stretches   Passive Hamstring Stretch  Right;3 reps;30 seconds    Gastroc Stretch  Right;Left;3 reps;30 seconds      Knee/Hip Exercises: Aerobic   Stationary Bike  partial revolutions forward /full slow revolutions backward  (not fast enough to turn bike on) for ROM x 7 min  Knee/Hip Exercises: Standing   SLS  Rt/Lt SLS x 2 reps, 15 sec       Knee/Hip Exercises: Seated   Long Arc Quad  Right;10 reps;2 sets    Other Seated Knee/Hip Exercises  seated scoot x 10 reps  with 10 sec hold.      Knee/Hip Exercises: Prone   Prone Knee Hang  2 minutes      Vasopneumatic   Number Minutes Vasopneumatic   10 minutes    Vasopnuematic Location   Knee    Vasopneumatic Pressure  Low    Vasopneumatic Temperature   34 deg      Manual Therapy   Soft tissue mobilization  STM to Rt quad, adductors, and  Rt hamstring to decrease fascial tightness and improve ROM.     Passive ROM  Rt knee into extension, multiple reps       Kinesiotix   Edema  I strip of reg Rock tape applied to each side of Rt knee with 20% stretch to decompress tissue and decrease pain and edema.        Verbally reviewed current HEP.         PT Short Term Goals - 08/27/18 1012      PT SHORT TERM GOAL #1   Title  improve Rt knee AROM 0-100 for improved function     Baseline  9/25: 8-107    Status  Partially Met      PT SHORT TERM GOAL #2   Title  amb > 150' with SPC modified independent for improved function    Status  Achieved        PT Long Term Goals - 10/09/18 1249      PT LONG TERM GOAL #1   Title  I with HEP    Time  4    Period  Weeks    Status  Achieved      PT LONG TERM GOAL #2   Title  improve Rt knee AROM 0-110 for improved function and mobility    Time  4    Period  Weeks    Status  Not Met      PT LONG TERM GOAL #3   Title  amb > 300' without device independently for improved functional mobility    Time  4    Period  Weeks    Status  Achieved      PT LONG TERM GOAL #4   Title  improve FOTO to </= 40% limitation for improved function    Time  4    Status  Achieved      PT LONG TERM GOAL #5   Title  report pain < 2/10 with mobility    Period  Weeks    Status  Achieved            Plan - 10/09/18 1246    Clinical Impression Statement  Pt tolerated all exercises well, with focus on ROM.  Pt has partially met his goals and verbalized readiness to d/c to HEP at this time.     Rehab Potential  Good    PT Frequency  2x / week    PT Duration  8 weeks    PT Treatment/Interventions  ADLs/Self Care Home Management;Cryotherapy;Electrical Stimulation;Ultrasound;Moist Heat;Gait training;Stair training;Functional mobility training;Therapeutic activities;Therapeutic exercise;Balance training;Neuromuscular re-education;Patient/family education;Manual techniques;Passive range of motion;Dry needling;Vasopneumatic Device;Taping    PT Next Visit Plan  spoke to supervising PT; will d/c at this time.    Consulted and Agree with  Plan of Care  Patient       Patient will benefit from skilled therapeutic intervention in order to improve the following deficits and impairments:  Abnormal gait, Increased fascial restricitons, Increased muscle spasms, Pain, Impaired flexibility, Decreased range of motion, Difficulty walking, Decreased  balance, Decreased activity tolerance, Decreased mobility, Decreased strength  Visit Diagnosis: Acute pain of right knee  Stiffness of right knee, not elsewhere classified  Other abnormalities of gait and mobility     Problem List Patient Active Problem List   Diagnosis Date Noted  . Total knee replacement status 08/02/2018  . 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 Primary osteoarthritis of right knee 10/21/2017  . BMI 37.0-37.9, adult 04/27/2016  . Glenohumeral arthritis of both shoulders 04/14/2015  . 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 Hypogonadism male 02/26/2014  . Lower leg edema 12/02/2013  . Lymph node enlargement 12/02/2013  . CKD (chronic kidney disease) stage 3, GFR 30-59 ml/min (HCC) 05/28/2013  . Atrial fibrillation ([place]) 06/25/2012  . 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 RESTRICTIVE LUNG DISEASE 01/12/2011  . ERECTILE DYSFUNCTION 01/06/2008  . ESSENTIAL HYPERTENSION, BENIGN 01/05/2008  . Venous (peripheral) insufficiency 01/05/2008   Kerin Perna, PTA 10/09/18 12:50 PM  [facility] [place], [place], [address] Phone: [phone]   Fax:  [phone]  Name: Jerome Hicks MRN: [id number] Date of Birth: 1951/06/25     PHYSICAL THERAPY DISCHARGE SUMMARY  Visits from Start of Care: 16  Current functional level related to goals / functional outcomes: See above   Remaining deficits: See above   Education / Equipment: HEP  Plan: Patient agrees to discharge.  Patient goals were partially met. Patient is being discharged due to being pleased with the current functional level.  ?????     Laureen Abrahams, PT, DPT 10/09/18 1:54 PM  [facility] [place], [place] [address]  [phone] (office) [phone] (fax)

## 2018-10-12 ENCOUNTER — Other Ambulatory Visit: Payer: Self-pay | Admitting: Family Medicine

## 2018-10-13 ENCOUNTER — Encounter: Payer: Medicare HMO | Admitting: Physical Therapy

## 2018-10-13 ENCOUNTER — Telehealth: Payer: Self-pay | Admitting: Family Medicine

## 2018-10-13 MED ORDER — TRAMADOL HCL 50 MG PO TABS: 50.0000 mg | ORAL_TABLET | Freq: Two times a day (BID) | ORAL | 1 refills | Status: DC | PRN

## 2018-10-13 NOTE — Telephone Encounter (Signed)
Pt advised.

## 2018-10-13 NOTE — Telephone Encounter (Signed)
Script refilled to [employer] [facility]

## 2018-10-13 NOTE — Telephone Encounter (Signed)
Routing to pcp for review and signature.Yoniel Arkwright [name], CMA  

## 2018-10-13 NOTE — Telephone Encounter (Signed)
Pt called, requested Rx for Tramadol. I see where this was on med list, but PCP removed. Routing.

## 2018-10-16 ENCOUNTER — Encounter: Payer: Medicare HMO | Admitting: Physical Therapy

## 2018-10-20 ENCOUNTER — Ambulatory Visit (INDEPENDENT_AMBULATORY_CARE_PROVIDER_SITE_OTHER): Payer: Medicare HMO | Admitting: Family Medicine

## 2018-10-20 DIAGNOSIS — I4891 Unspecified atrial fibrillation: Secondary | ICD-10-CM

## 2018-10-20 LAB — POCT INR: INR: 2.6 (ref 2.0–3.0)

## 2018-10-20 NOTE — Progress Notes (Signed)
Pt in today for INR. Todays INR was 2.6 , per Dr. Joellyn Quails patient is to continue current dose and to return in 3 weeks. Pt aware

## 2018-11-11 ENCOUNTER — Ambulatory Visit (INDEPENDENT_AMBULATORY_CARE_PROVIDER_SITE_OTHER): Payer: Medicare HMO | Admitting: Family Medicine

## 2018-11-11 DIAGNOSIS — I4891 Unspecified atrial fibrillation: Secondary | ICD-10-CM | POA: Diagnosis not present

## 2018-11-11 LAB — POCT INR: INR: 3.9 — AB (ref 2.0–3.0)

## 2018-11-11 NOTE — Progress Notes (Signed)
Pt in today for PT/INR . Pt reports taking 5mg  of warfarin on Mon., Weds, and Friday, and 2.5 mg all others. Pt's INR today was 3.9. Discussed with provider. Pt is to take 5 mg on Monday and Thursday, and 2.5 mg all others. Pt was advised to return to clinic in 10 for recheck. Instructions were written down and given to patient.

## 2018-11-21 ENCOUNTER — Ambulatory Visit (INDEPENDENT_AMBULATORY_CARE_PROVIDER_SITE_OTHER): Payer: Medicare HMO | Admitting: Family Medicine

## 2018-11-21 DIAGNOSIS — I4891 Unspecified atrial fibrillation: Secondary | ICD-10-CM | POA: Diagnosis not present

## 2018-11-21 LAB — POCT INR: INR: 2.7 (ref 2.0–3.0)

## 2018-11-21 NOTE — Progress Notes (Signed)
Agree with documentation as above.   Clemence Stillings, MD  

## 2018-11-21 NOTE — Progress Notes (Signed)
Pt in today for PT/INR . Pt reports taking 5mg  of warfarin on Mon. And Thursday, and 2.5 mg all others. Pt's INR today was 2.7. Discussed with provider. Pt is to continue 5 mg on Monday and Thursday, and 2.5 mg all others. Pt was advised to return to clinic in 14 days for recheck. Instructions were written down and given to patient.

## 2018-12-09 ENCOUNTER — Ambulatory Visit (INDEPENDENT_AMBULATORY_CARE_PROVIDER_SITE_OTHER): Payer: Medicare HMO | Admitting: Family Medicine

## 2018-12-09 DIAGNOSIS — I482 Chronic atrial fibrillation, unspecified: Secondary | ICD-10-CM

## 2018-12-09 LAB — POCT INR: INR: 1.5 — AB (ref 2.0–3.0)

## 2018-12-09 NOTE — Progress Notes (Signed)
Patient advised of recommendations.  

## 2018-12-18 ENCOUNTER — Ambulatory Visit (INDEPENDENT_AMBULATORY_CARE_PROVIDER_SITE_OTHER): Payer: Medicare HMO | Admitting: Family Medicine

## 2018-12-18 DIAGNOSIS — I482 Chronic atrial fibrillation, unspecified: Secondary | ICD-10-CM | POA: Diagnosis not present

## 2018-12-18 LAB — POCT INR: INR: 2.6 (ref 2.0–3.0)

## 2018-12-18 NOTE — Progress Notes (Signed)
Pt in today for INR check. Pt reports taking   5 mg on Monday ,Wednesday and Thursday, and 2.5 mg all other days.Pt reports no change in diet, no recent hospitalizations, no bleeding or bruising, or SOB. Todays INR was 2.6  . Results discussed with provider and no changes made. Pt to return in 2 weeks for next INR. Pt notified.

## 2019-01-01 ENCOUNTER — Ambulatory Visit (INDEPENDENT_AMBULATORY_CARE_PROVIDER_SITE_OTHER): Payer: Medicare HMO | Admitting: Sports Medicine

## 2019-01-01 DIAGNOSIS — I482 Chronic atrial fibrillation, unspecified: Secondary | ICD-10-CM | POA: Diagnosis not present

## 2019-01-01 LAB — POCT INR: INR: 3 (ref 2.0–3.0)

## 2019-01-01 NOTE — Progress Notes (Signed)
Called and left patient a voicemail with instructions. I also provided the office's contact information so he can call back and make his next INR appointment.

## 2019-01-01 NOTE — Progress Notes (Signed)
INR is therapeutic, no change in dose.  5 mg on Mon , Wed, and Thur.  1/2 tab all other days.  Return in 2 weeks.

## 2019-01-15 ENCOUNTER — Ambulatory Visit: Payer: Medicare HMO

## 2019-01-21 ENCOUNTER — Ambulatory Visit (INDEPENDENT_AMBULATORY_CARE_PROVIDER_SITE_OTHER): Payer: Medicare HMO | Admitting: Family Medicine

## 2019-01-21 DIAGNOSIS — I482 Chronic atrial fibrillation, unspecified: Secondary | ICD-10-CM | POA: Diagnosis not present

## 2019-01-21 LAB — POCT INR: INR: 1.8 — AB (ref 2.0–3.0)

## 2019-01-21 NOTE — Progress Notes (Signed)
Pt in today for INR check. Pt reports taking Pt reports taking 5 mg on Monday , Wednesday and Thursday. Pt takes 2.5 mg all other days. Pt reports no change in diet, no recent hospitalizations, no bleeding or bruising, or SOB. Todays INR was 1.8. Results discussed with provider pt instructed to change to taking 5 mg on Mon., Weds.,Friday and Sunday.. Pt to return in 2 weeks for next INR. Pt notified.

## 2019-01-21 NOTE — Progress Notes (Signed)
Agree with documentation as above.   Ervin Rothbauer, MD  

## 2019-02-04 ENCOUNTER — Ambulatory Visit (INDEPENDENT_AMBULATORY_CARE_PROVIDER_SITE_OTHER): Payer: Medicare HMO | Admitting: Family Medicine

## 2019-02-04 ENCOUNTER — Encounter: Payer: Self-pay | Admitting: Family Medicine

## 2019-02-04 ENCOUNTER — Ambulatory Visit (INDEPENDENT_AMBULATORY_CARE_PROVIDER_SITE_OTHER): Payer: Medicare HMO

## 2019-02-04 VITALS — BP 133/79 | HR 86 | Temp 98.1°F | Ht 74.0 in | Wt 294.0 lb

## 2019-02-04 DIAGNOSIS — I482 Chronic atrial fibrillation, unspecified: Secondary | ICD-10-CM

## 2019-02-04 DIAGNOSIS — J61 Pneumoconiosis due to asbestos and other mineral fibers: Secondary | ICD-10-CM | POA: Diagnosis not present

## 2019-02-04 DIAGNOSIS — Z5181 Encounter for therapeutic drug level monitoring: Secondary | ICD-10-CM | POA: Diagnosis not present

## 2019-02-04 DIAGNOSIS — Z7901 Long term (current) use of anticoagulants: Secondary | ICD-10-CM

## 2019-02-04 DIAGNOSIS — J984 Other disorders of lung: Secondary | ICD-10-CM | POA: Diagnosis not present

## 2019-02-04 DIAGNOSIS — R05 Cough: Secondary | ICD-10-CM

## 2019-02-04 DIAGNOSIS — R059 Cough, unspecified: Secondary | ICD-10-CM

## 2019-02-04 LAB — POCT INR: INR: 3.1 — AB (ref 2.0–3.0)

## 2019-02-04 MED ORDER — PREDNISONE 10 MG PO TABS: 30.0000 mg | ORAL_TABLET | Freq: Every day | ORAL | 0 refills | Status: DC

## 2019-02-04 MED ORDER — BENZONATATE 200 MG PO CAPS: 200.0000 mg | ORAL_CAPSULE | Freq: Three times a day (TID) | ORAL | 0 refills | Status: DC | PRN

## 2019-02-04 MED ORDER — CEFDINIR 300 MG PO CAPS: 300.0000 mg | ORAL_CAPSULE | Freq: Two times a day (BID) | ORAL | 0 refills | Status: DC

## 2019-02-04 MED ORDER — HYDROCOD POLST-CPM POLST ER 10-8 MG/5ML PO SUER: 5.0000 mL | Freq: Two times a day (BID) | ORAL | 0 refills | Status: DC | PRN

## 2019-02-04 NOTE — Progress Notes (Signed)
Jerome Hicks is a 68 y.o. male who presents to [place]: [place] today for cough wheezing shortness of breath head congestion and pain.  Symptoms present for about 2 weeks.  Patient has persistent wheezing and cough.  He has a medical history significant for pulmonary asbestosis and restrictive lung disease.  Additionally he has atrial fibrillation and is anticoagulated with warfarin.  He has been trying some over-the-counter medications which developed only a little and are causing elevated blood pressures.  Cough is very obnoxious and interfering with sleep. Patient denies any travel history or exposure to people with known COVID-19.   Additionally patient was originally scheduled today for INR check.  He takes warfarin is listed below.  He denies any bleeding.  ROS as above: No , visual changes, nausea, vomiting, diarrhea, constipation, dizziness, abdominal pain, skin rash, fevers, chills, night sweats, weight loss, swollen lymph nodes, body aches, joint swelling, muscle aches, chest pain,mood changes, visual or auditory hallucinations.   Positive for headache and facial pain, and shortness of breath. Exam:  BP 133/79   Pulse 86   Temp 98.1 F (36.7 C) (Oral)   Ht 6\' 2"  (1.88 m)   Wt 294 lb (133.4 kg)   SpO2 97%   BMI 37.75 kg/m  Wt Readings from Last 5 Encounters:  02/04/19 294 lb (133.4 kg)  01/21/19 290 lb (131.5 kg)  01/01/19 290 lb (131.5 kg)  12/18/18 290 lb (131.5 kg)  11/21/18 292 lb (132.5 kg)    Gen: Well NAD HEENT: EOMI,  MMM clear nasal discharge with inflamed nasal turbinates.  Normal tympanic membranes bilaterally.  Posterior pharynx with cobblestoning.  Mild cervical lymphadenopathy present bilaterally. Lungs: Normal work of breathing.  Coarse sounds with prolonged expiratory phase. Heart: Irregular irregular no MRG Abd: NABS, Soft. Nondistended,  Nontender Exts: Brisk capillary refill, warm and well perfused.    Lab and Radiology Results Results for orders placed or performed in visit on 02/04/19 (from the past 72 hour(s))  POCT INR     Status: Abnormal   Collection Time: 02/04/19  9:25 AM  Result Value Ref Range   INR 3.1 (A) 2.0 - 3.0    Two-view chest x-ray images personally independently reviewed. Right hemidiaphragm elevation present.  Fibrosis appearance chronic unchanged from prior x-ray.  No acute infiltrates. Await formal radiology review    Assessment and Plan: 68 y.o. male with  Cough wheezing and congestion.  Likely viral etiology.  However this is in the setting of restrictive lung disease and fibrosis.  Patient does seem to have some obstructive symptoms as well today.  Plan to prescribe albuterol inhaler and Tessalon Perles.  Recommend continued over-the-counter medication including Mucinex DM extended release. We will print backup prescription for prednisone and for Omnicef.  Recommend avoiding taking these medications unless worsening or not improving.  Omnicef should be safe.  Patient has history of penicillin allergy as a child with no anaphylaxis history.  Patient has atrial fibrillation and was originally scheduled for INR check.  INR 3.1.  This is right around goal and plan for recheck in 1 month.  PDMP not reviewed this encounter. Orders Placed This Encounter  Procedures  . POCT INR    This back office order was created through the Results Console.   No orders of the defined types were placed in this encounter.    Historical information moved to improve visibility of documentation.  Past Medical History:  Diagnosis  Date  . Arthritis    oa  . 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 Ascending aortic aneurysm ([place])    seen on 01-31-18 ct chest ; patient denies awareness  . Atrial fibrillation ([place])   . Cataracts, bilateral   . ED (erectile dysfunction)    has been prescribed Levitra and Viagrain  in the past  . Hypertension   . Internal hemorrhoids   . Kidney stones   . Leg edema    recurrent cellulitis  . Scrotal abscess 3-03  . Shoulder arthritis   . Sigmoid diverticulosis    Past Surgical History:  Procedure Laterality Date  .  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 CHOLECYSTECTOMY  1971  . kidney stones  1990's   Ureteroscopic stone extraction  . TONSILLECTOMY    . 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   Procedure: RIGHT TOTAL KNEE ARTHROPLASTY;  Surgeon: Dorna Leitz, MD;  Location: [facility];  Service: Orthopedics;  Laterality: Right;   Social History   Tobacco Use  . Smoking status: Never Smoker  . Smokeless tobacco: Current User    Types: Chew  Substance Use Topics  . Alcohol use: No   family history includes Heart attack (age of onset: 65) in his sister; Heart attack (age of onset: 104) in his brother; Heart attack (age of onset: 85) in his mother; Heart attack (age of onset: 38) in his father; Multiple sclerosis in his daughter.  Medications: Current Outpatient Medications  Medication Sig Dispense Refill  . albuterol (PROVENTIL HFA;VENTOLIN HFA) 108 (90 Base) MCG/ACT inhaler Inhale 1-2 puffs into the lungs every 6 (six) hours as needed for wheezing or shortness of breath. 1 Inhaler 2  . Carboxymethylcellul-Glycerin (LUBRICATING EYE DROPS OP) Place 1 drop into both eyes daily as needed (dry eyes).    . carvedilol (COREG) 3.125 MG tablet Take 1 tablet (3.125 mg total) by mouth 2 (two) times daily with a meal. 180 tablet 3  . clotrimazole-betamethasone (LOTRISONE) cream Apply 1 application topically daily as needed (rash).     Marland Kitchen doxazosin (CARDURA) 4 MG tablet Take 1 tablet (4 mg total) by mouth daily. 90 tablet 1  . lisinopril (PRINIVIL,ZESTRIL) 10 MG tablet Take 1 tablet (10 mg total) by mouth daily. (Patient taking differently: Take 5 mg by mouth daily. ) 90 tablet 1  . meclizine (ANTIVERT) 25 MG tablet TAKE 1 TABLET THREE TIMES  DAILY AS NEEDED  FOR  DIZZINESS  OR  NAUSEA  AS DIRECTED 90 tablet 1  . Multiple Vitamin (MULTIVITAMIN WITH MINERALS) TABS tablet Take 1 tablet by mouth daily.    . potassium chloride (K-DUR,KLOR-CON) 10 MEQ tablet Take 1 tablet (10 mEq total) by mouth daily. 90 tablet 1  . tiZANidine (ZANAFLEX) 4 MG tablet TAKE 1 TABLET BY MOUTH EVERY 8 HOURS AS NEEDED FOR MUSCLE SPASM  0  . traMADol (ULTRAM) 50 MG tablet Take 1-2 tablets (50-100 mg total) by mouth 2 (two) times daily as needed. 360 tablet 1  . vitamin B-12 (CYANOCOBALAMIN) 1000 MCG tablet Take 1,000 mcg by mouth daily.    Marland Kitchen warfarin (COUMADIN) 5 MG tablet Take 1 tablet (5 mg total) by mouth daily. (Patient taking differently: Take 2.5-5 mg by mouth See admin instructions. Take 5 mg daily on Mon, Wed, Thurs, and Sat  Take 2.5 mg daily on Tues, Fri, and Sun) 90 tablet 3   No current facility-administered medications for this visit.    Allergies  Allergen Reactions  .  Penicillins     Childhood allergy Has patient had a PCN reaction causing immediate rash, facial/tongue/throat swelling, SOB or lightheadedness with hypotension: Unknown Has patient had a PCN reaction causing severe rash involving mucus membranes or skin necrosis: Unknown Has patient had a PCN reaction that required hospitalization: Unknown Has patient had a PCN reaction occurring within the last 10 years: No If all of the above answers are "NO", then may proceed with Cephalosporin use.      Discussed warning signs or symptoms. Please see discharge instructions. Patient expresses understanding.

## 2019-02-04 NOTE — Patient Instructions (Addendum)
Thank you for coming in today. Take over-the-counter Mucinex DM extended release 1200/60 twice daily. Use Tessalon Perles as well. Call or go to the emergency room if you get worse, have trouble breathing, have chest pains, or palpitations.   Use the albuterol inhaler.    If not better fill and take the prednisone.   If worse fill and take the antibiotic.    Acute Bronchitis, Adult  Acute bronchitis is sudden (acute) swelling of the air tubes (bronchi) in the lungs. Acute bronchitis causes these tubes to fill with mucus, which can make it hard to breathe. It can also cause coughing or wheezing. In adults, acute bronchitis usually goes away within 2 weeks. A cough caused by bronchitis may last up to 3 weeks. Smoking, allergies, and asthma can make the condition worse. Repeated episodes of bronchitis may cause further lung problems, such as chronic obstructive pulmonary disease (COPD). What are the causes? This condition can be caused by germs and by substances that irritate the lungs, including:  Cold and flu viruses. This condition is most often caused by the same virus that causes a cold.  Bacteria.  Exposure to tobacco smoke, dust, fumes, and air pollution. What increases the risk? This condition is more likely to develop in people who:  Have close contact with someone with acute bronchitis.  Are exposed to lung irritants, such as tobacco smoke, dust, fumes, and vapors.  Have a weak immune system.  Have a respiratory condition such as asthma. What are the signs or symptoms? Symptoms of this condition include:  A cough.  Coughing up clear, yellow, or green mucus.  Wheezing.  Chest congestion.  Shortness of breath.  A fever.  Body aches.  Chills.  A sore throat. How is this diagnosed? This condition is usually diagnosed with a physical exam. During the exam, your health care provider may order tests, such as chest X-rays, to rule out other conditions. He or she  may also:  Test a sample of your mucus for bacterial infection.  Check the level of oxygen in your blood. This is done to check for pneumonia.  Do a chest X-ray or lung function testing to rule out pneumonia and other conditions.  Perform blood tests. Your health care provider will also ask about your symptoms and medical history. How is this treated? Most cases of acute bronchitis clear up over time without treatment. Your health care provider may recommend:  Drinking more fluids. Drinking more makes your mucus thinner, which may make it easier to breathe.  Taking a medicine for a fever or cough.  Taking an antibiotic medicine.  Using an inhaler to help improve shortness of breath and to control a cough.  Using a cool mist vaporizer or humidifier to make it easier to breathe. Follow these instructions at home: Medicines  Take over-the-counter and prescription medicines only as told by your health care provider.  If you were prescribed an antibiotic, take it as told by your health care provider. Do not stop taking the antibiotic even if you start to feel better. General instructions   Get plenty of rest.  Drink enough fluids to keep your urine pale yellow.  Avoid smoking and secondhand smoke. Exposure to cigarette smoke or irritating chemicals will make bronchitis worse. If you smoke and you need help quitting, ask your health care provider. Quitting smoking will help your lungs heal faster.  Use an inhaler, cool mist vaporizer, or humidifier as told by your health care provider.  Keep  all follow-up visits as told by your health care provider. This is important. How is this prevented? To lower your risk of getting this condition again:  Wash your hands often with soap and water. If soap and water are not available, use hand sanitizer.  Avoid contact with people who have cold symptoms.  Try not to touch your hands to your mouth, nose, or eyes.  Make sure to get the flu  shot every year. Contact a health care provider if:  Your symptoms do not improve in 2 weeks of treatment. Get help right away if:  You cough up blood.  You have chest pain.  You have severe shortness of breath.  You become dehydrated.  You faint or keep feeling like you are going to faint.  You keep vomiting.  You have a severe headache.  Your fever or chills gets worse. This information is not intended to replace advice given to you by your health care provider. Make sure you discuss any questions you have with your health care provider. Document Released: 12/27/2004 Document Revised: 07/03/2017 Document Reviewed: 05/09/2016 Elsevier Interactive Patient Education  2019 [employer].

## 2019-02-23 ENCOUNTER — Other Ambulatory Visit: Payer: Self-pay | Admitting: Family Medicine

## 2019-02-23 ENCOUNTER — Other Ambulatory Visit: Payer: Self-pay | Admitting: *Deleted

## 2019-02-23 DIAGNOSIS — I482 Chronic atrial fibrillation, unspecified: Secondary | ICD-10-CM

## 2019-02-23 MED ORDER — WARFARIN SODIUM 5 MG PO TABS: 2.5000 mg | ORAL_TABLET | ORAL | 3 refills | Status: DC

## 2019-02-23 NOTE — Telephone Encounter (Signed)
Patient notified via VM that order has been fixed. KG LPN

## 2019-02-23 NOTE — Telephone Encounter (Signed)
Patient calling in stating that he needs warfarin (COUMADIN) 5 MG tablet [[id number]] called in to [place], [place].

## 2019-02-23 NOTE — Progress Notes (Signed)
refill 

## 2019-03-04 ENCOUNTER — Telehealth: Payer: Self-pay | Admitting: Family Medicine

## 2019-03-04 NOTE — Telephone Encounter (Signed)
-----   Message from Gregor Hams, MD sent at 03/03/2019  9:13 AM EDT ----- Regarding: April 6 visit Please contact patient.  He has a follow-up visit with me scheduled for April 6.  If it is for orthopedics that should be with me via WebEx or in person.  If it is to follow-up his breathing and atrial fibrillation it should be done remotely via WebEx with Dr. Charise Carwin his primary care provider.

## 2019-03-04 NOTE — Telephone Encounter (Signed)
Appointment was confirmed with Jerome Hicks. No further questions at this time.

## 2019-03-09 ENCOUNTER — Ambulatory Visit: Payer: Medicare HMO | Admitting: Family Medicine

## 2019-03-09 DIAGNOSIS — Z9889 Other specified postprocedural states: Secondary | ICD-10-CM | POA: Diagnosis not present

## 2019-03-09 DIAGNOSIS — M25561 Pain in right knee: Secondary | ICD-10-CM | POA: Diagnosis not present

## 2019-03-10 ENCOUNTER — Encounter: Payer: Self-pay | Admitting: Family Medicine

## 2019-03-10 ENCOUNTER — Ambulatory Visit (INDEPENDENT_AMBULATORY_CARE_PROVIDER_SITE_OTHER): Payer: Medicare HMO | Admitting: Family Medicine

## 2019-03-10 VITALS — BP 128/67 | HR 76 | Wt 299.0 lb

## 2019-03-10 DIAGNOSIS — Z5181 Encounter for therapeutic drug level monitoring: Secondary | ICD-10-CM

## 2019-03-10 DIAGNOSIS — I77810 Thoracic aortic ectasia: Secondary | ICD-10-CM

## 2019-03-10 DIAGNOSIS — J984 Other disorders of lung: Secondary | ICD-10-CM | POA: Diagnosis not present

## 2019-03-10 DIAGNOSIS — I482 Chronic atrial fibrillation, unspecified: Secondary | ICD-10-CM

## 2019-03-10 DIAGNOSIS — J61 Pneumoconiosis due to asbestos and other mineral fibers: Secondary | ICD-10-CM | POA: Diagnosis not present

## 2019-03-10 DIAGNOSIS — Z7901 Long term (current) use of anticoagulants: Secondary | ICD-10-CM | POA: Diagnosis not present

## 2019-03-10 DIAGNOSIS — I2510 Atherosclerotic heart disease of native coronary artery without angina pectoris: Secondary | ICD-10-CM

## 2019-03-10 DIAGNOSIS — I2584 Coronary atherosclerosis due to calcified coronary lesion: Secondary | ICD-10-CM | POA: Diagnosis not present

## 2019-03-10 LAB — POCT INR: INR: 3.3 — AB (ref 2.0–3.0)

## 2019-03-10 MED ORDER — WARFARIN SODIUM 5 MG PO TABS: 2.5000 mg | ORAL_TABLET | ORAL | 3 refills | Status: DC

## 2019-03-10 NOTE — Patient Instructions (Addendum)
Thank you for coming in today.  We are decreasing the warfarin slightly.  For Warfarin Take 5 mg daily on Mon, Thurs, and Sat  Take 2.5 mg daily on Tues, Wed, Fri, and Sun  I will ask Dr Madilyn Fireman what she wants to do about the Ansurysm and the Coronary Artery disease.  The answer will probably be continue current medicine and keep the blood pressure and cholesterol controlled.   Recheck with Dr Madilyn Fireman in 1 month.  She will likely want to adjust the warfarin and may change medicines.   Ask your insurance about Eliquis and Alen Blew

## 2019-03-10 NOTE — Progress Notes (Signed)
Jerome Hicks is a 68 y.o. male who presents to [place]: [place] today for follow-up cough and also discuss INR and warfarin adjustment is well as discussed chest findings on CT scan.  Jerome Hicks was seen 1 month ago for cough.  Jerome Hicks notes that Jerome Hicks is feeling 100% better and is happy with how things are going.  Jerome Hicks was seen by a pulmonologist in [place] as Jerome Hicks has a history of asbestosis.  Jerome Hicks had a chest CT scan and pulmonary function test.  The pulmonary function test shows moderate to severe restrictive and obstructive lung disease. The chest CT scan shows right hemidiaphragm elevation and evidence of asbestosis. Additionally unexpectedly it showed a 42 mm thoracic ascending aneurysm and coronary artery calcification significant for moderate CAD. Jerome Hicks notes shortness of breath chronically but denies chest pain or palpitation.  Jerome Hicks notes Jerome Hicks blood pressure and cholesterol typically pretty well controlled.  Additionally Jerome Hicks takes warfarin daily for atrial fibrillation.  Jerome Hicks takes 5 mg 4 days a week and 2.5 mg 3 days a week.  Jerome Hicks denies any bleeding.  Jerome Hicks notes previously Jerome Hicks was well tolerated on Eliquis but it was too expensive.  Jerome Hicks does not eat leafy green vegetables.  ROS as above:  Exam:  BP 128/67   Pulse 76   Wt 299 lb (135.6 kg)   BMI 38.39 kg/m  Wt Readings from Last 5 Encounters:  03/10/19 299 lb (135.6 kg)  02/04/19 294 lb (133.4 kg)  01/21/19 290 lb (131.5 kg)  01/01/19 290 lb (131.5 kg)  12/18/18 290 lb (131.5 kg)    Gen: Well NAD HEENT: EOMI,  MMM Lungs: Normal work of breathing. CTABL Heart: RRR no MRG Abd: NABS, Soft. Nondistended, Nontender Exts: Brisk capillary refill, warm and well perfused.   Lab and Radiology Results Results for orders placed or performed in visit on 03/10/19 (from the past 72 hour(s))  POCT INR     Status: Abnormal   Collection  Time: 03/10/19  8:45 AM  Result Value Ref Range   INR 3.3 (A) 2.0 - 3.0   No results found.    Assessment and Plan: 68 y.o. male with  Anticoagulation: INR elevated at 3.3.  1 month ago it was also elevated at 3.1.  Plan to decrease warfarin to 5 mg 3 days a week and 2.5 mg 4 days a week.  New prescription provided.  Plan on recheck with PCP in 1 month.  Consider home health INR checking via PCP as well.  Additionally I have asked patient to double check with Jerome Hicks insurance about cost of Eliquis or Xarelto.  They may be cheaper now since the last time Jerome Hicks checked.  Coronary artery disease.  Seen on chest CT scan.  Patient has well-controlled lipids without statins.  Plan for watchful waiting and deferment to PCP for further management.  Patient may benefit from statins regardless even if Jerome Hicks LDL is typically pretty well controlled.  Last LDL was 77 about 10 or 11 months ago.  Thoracic aortic aneurysm: Previously known appears to be stable.  Defer to PCP for further management.  Blood pressure and cholesterol pretty well controlled.   Cough: Significantly improved now asymptomatic.  Watchful waiting.    PDMP not reviewed this encounter. Orders Placed This Encounter  Procedures  . POCT INR   Meds ordered this encounter  Medications  . warfarin (COUMADIN) 5 MG tablet    Sig: Take 0.5-1 tablets (2.5-5  mg total) by mouth See admin instructions. Take 5 mg daily on Mon, Thurs, and Sat  Take 2.5 mg daily on Tues, Wed, Fri, and Sun    Dispense:  60 tablet    Refill:  3     Historical information moved to improve visibility of documentation.  Past Medical History:  Diagnosis Date  . Arthritis    oa  . 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 Ascending aortic aneurysm ([place])    seen on 01-31-18 ct chest ; patient denies awareness  . Atrial fibrillation ([place])   . Cataracts, bilateral   . ED (erectile dysfunction)    has been prescribed Levitra and Viagrain in the past  . Hypertension    . Internal hemorrhoids   . Kidney stones   . Leg edema    recurrent cellulitis  . Scrotal abscess 3-03  . Shoulder arthritis   . Sigmoid diverticulosis    Past Surgical History:  Procedure Laterality Date  .  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 CHOLECYSTECTOMY  1971  . kidney stones  1990's   Ureteroscopic stone extraction  . TONSILLECTOMY    . 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   Procedure: RIGHT TOTAL KNEE ARTHROPLASTY;  Surgeon: Dorna Leitz, MD;  Location: [facility];  Service: Orthopedics;  Laterality: Right;   Social History   Tobacco Use  . Smoking status: Never Smoker  . Smokeless tobacco: Current User    Types: Chew  Substance Use Topics  . Alcohol use: No   family history includes Heart attack (age of onset: 57) in Jerome Hicks sister; Heart attack (age of onset: 54) in Jerome Hicks brother; Heart attack (age of onset: 32) in Jerome Hicks mother; Heart attack (age of onset: 65) in Jerome Hicks father; Multiple sclerosis in Jerome Hicks daughter.  Medications: Current Outpatient Medications  Medication Sig Dispense Refill  . albuterol (PROVENTIL HFA;VENTOLIN HFA) 108 (90 Base) MCG/ACT inhaler Inhale 1-2 puffs into the lungs every 6 (six) hours as needed for wheezing or shortness of breath. 1 Inhaler 2  . Carboxymethylcellul-Glycerin (LUBRICATING EYE DROPS OP) Place 1 drop into both eyes daily as needed (dry eyes).    . carvedilol (COREG) 3.125 MG tablet Take 1 tablet (3.125 mg total) by mouth 2 (two) times daily with a meal. 180 tablet 3  . chlorpheniramine-HYDROcodone (TUSSIONEX) 10-8 MG/5ML SUER Take 5 mLs by mouth every 12 (twelve) hours as needed for cough (cough, will cause drowsiness.). 120 mL 0  . clotrimazole-betamethasone (LOTRISONE) cream Apply 1 application topically daily as needed (rash).     Marland Kitchen doxazosin (CARDURA) 4 MG tablet Take 1 tablet (4 mg total) by mouth daily. 90 tablet 1  . lisinopril (PRINIVIL,ZESTRIL) 10 MG tablet Take 1 tablet (10 mg total) by mouth  daily. (Patient taking differently: Take 5 mg by mouth daily. ) 90 tablet 1  . meclizine (ANTIVERT) 25 MG tablet TAKE 1 TABLET THREE TIMES DAILY AS NEEDED  FOR  DIZZINESS  OR  NAUSEA  AS DIRECTED 90 tablet 1  . Multiple Vitamin (MULTIVITAMIN WITH MINERALS) TABS tablet Take 1 tablet by mouth daily.    . potassium chloride (K-DUR,KLOR-CON) 10 MEQ tablet Take 1 tablet (10 mEq total) by mouth daily. 90 tablet 1  . tiZANidine (ZANAFLEX) 4 MG tablet TAKE 1 TABLET BY MOUTH EVERY 8 HOURS AS NEEDED FOR MUSCLE SPASM  0  . traMADol (ULTRAM) 50 MG tablet Take 1-2 tablets (50-100 mg total) by mouth 2 (two) times daily as needed.  360 tablet 1  . vitamin B-12 (CYANOCOBALAMIN) 1000 MCG tablet Take 1,000 mcg by mouth daily.    Marland Kitchen warfarin (COUMADIN) 5 MG tablet Take 0.5-1 tablets (2.5-5 mg total) by mouth See admin instructions. Take 5 mg daily on Mon, Thurs, and Sat  Take 2.5 mg daily on Tues, Wed, Fri, and Sun 60 tablet 3   No current facility-administered medications for this visit.    Allergies  Allergen Reactions  . Penicillins     Childhood allergy Has patient had a PCN reaction causing immediate rash, facial/tongue/throat swelling, SOB or lightheadedness with hypotension: Unknown Has patient had a PCN reaction causing severe rash involving mucus membranes or skin necrosis: Unknown Has patient had a PCN reaction that required hospitalization: Unknown Has patient had a PCN reaction occurring within the last 10 years: No If all of the above answers are "NO", then may proceed with Cephalosporin use.      Discussed warning signs or symptoms. Please see discharge instructions. Patient expresses understanding.

## 2019-04-03 ENCOUNTER — Other Ambulatory Visit: Payer: Self-pay | Admitting: Family Medicine

## 2019-04-03 DIAGNOSIS — I10 Essential (primary) hypertension: Secondary | ICD-10-CM

## 2019-04-06 ENCOUNTER — Ambulatory Visit (INDEPENDENT_AMBULATORY_CARE_PROVIDER_SITE_OTHER): Payer: Medicare HMO | Admitting: Family Medicine

## 2019-04-06 ENCOUNTER — Encounter: Payer: Self-pay | Admitting: Family Medicine

## 2019-04-06 ENCOUNTER — Ambulatory Visit: Payer: Self-pay | Admitting: Family Medicine

## 2019-04-06 VITALS — BP 135/75 | HR 92 | Temp 98.1°F | Ht 74.0 in | Wt 293.0 lb

## 2019-04-06 DIAGNOSIS — I10 Essential (primary) hypertension: Secondary | ICD-10-CM | POA: Diagnosis not present

## 2019-04-06 DIAGNOSIS — N183 Chronic kidney disease, stage 3 unspecified: Secondary | ICD-10-CM

## 2019-04-06 DIAGNOSIS — S81801A Unspecified open wound, right lower leg, initial encounter: Secondary | ICD-10-CM | POA: Diagnosis not present

## 2019-04-06 DIAGNOSIS — Z125 Encounter for screening for malignant neoplasm of prostate: Secondary | ICD-10-CM | POA: Diagnosis not present

## 2019-04-06 DIAGNOSIS — I482 Chronic atrial fibrillation, unspecified: Secondary | ICD-10-CM | POA: Diagnosis not present

## 2019-04-06 DIAGNOSIS — I872 Venous insufficiency (chronic) (peripheral): Secondary | ICD-10-CM | POA: Diagnosis not present

## 2019-04-06 DIAGNOSIS — I77810 Thoracic aortic ectasia: Secondary | ICD-10-CM

## 2019-04-06 LAB — POCT INR: INR: 3 (ref 2.0–3.0)

## 2019-04-06 MED ORDER — SULFAMETHOXAZOLE-TRIMETHOPRIM 800-160 MG PO TABS: 1.0000 | ORAL_TABLET | Freq: Two times a day (BID) | ORAL | 0 refills | Status: DC

## 2019-04-06 NOTE — Assessment & Plan Note (Signed)
Followed by cardiology 

## 2019-04-06 NOTE — Assessment & Plan Note (Signed)
Table.  Asymptomatic.  On Coumadin.  INR 3.0 today.  Plan to recheck again in 3 weeks.

## 2019-04-06 NOTE — Assessment & Plan Note (Signed)
To recheck renal function. 

## 2019-04-06 NOTE — Progress Notes (Signed)
Pt here for INR check no missed doses, diet changes,bruising,bleeding,CP,SOB..Jerome Hicks, CMA  

## 2019-04-06 NOTE — Assessment & Plan Note (Signed)
Quite as well-controlled as I would like.  And discussed that with blood pressures being a little elevated that that actually can worsen venous stasis.  He has been trying to lose weight which always seems to help.  Really make sure working on low-salt diet.  Work on add a few days of furosemide.  But we may need to adjust his overall regimen if not well controlled.

## 2019-04-06 NOTE — Assessment & Plan Note (Signed)
Most likely cause of current swelling.  Continue wear compression stockings and reduce salt intake.

## 2019-04-06 NOTE — Progress Notes (Signed)
Established Patient Office Visit  Subjective:  Patient ID: Jerome Hicks, male    DOB: 1951-05-30  Age: 68 y.o. MRN: [id number]  CC:  Chief Complaint  Patient presents with   Hyperlipidemia   Hypertension    HPI Jerome Hicks presents for   Hypertension- Pt denies chest pain, SOB, dizziness, or heart palpitations.  Taking meds as directed w/o problems.  Denies medication side effects.    F/U Afib -no recent chest pain or shortness of breath.  He has had some new onset increase in swelling.  F/U CKD 3 -due to recheck renal function.  F/U aortic ectasia -he had no recent chest pain or shortness of breath.  He does complain that he is had some increase in lower extremity edema over the last few weeks to maybe even month or so.  No recent changes in fact over the last 2 weeks he is actually been trying to lose weight.  He has had problems in the past when he gains weight he tends to retain more fluid.  He has been wearing his compression stockings but just did not have them on today because he wanted me to see his right lower leg.  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  He says the skin will grow back thick and scaly and then it will get easily bumped and then tear off and then he has a new wound.  Right now he has a wound on the right anterior leg and one on the right posterior leg.  He wonders if it could be MRSA again.  The one posteriorly has some more erythema around it.  He says he does try to moisturize regularly.  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)    get yearly PFT's   Ascending aortic aneurysm ([place])    seen on 01-31-18 ct chest ; patient denies awareness   Atrial fibrillation ([place])    Cataracts, bilateral    ED (erectile dysfunction)    has been prescribed Levitra and Viagrain in the past   Hypertension      Internal hemorrhoids    Kidney stones    Leg edema    recurrent cellulitis   Scrotal abscess 3-03   Shoulder arthritis    Sigmoid diverticulosis     Past Surgical History:  Procedure Laterality Date    MRSA infection in left groin requiring surgical debridement  12-08   CHOLECYSTECTOMY  1971   kidney stones  1990's   Ureteroscopic stone extraction   TONSILLECTOMY     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   Procedure: RIGHT TOTAL KNEE ARTHROPLASTY;  Surgeon: Dorna Leitz, MD;  Location: [facility];  Service: Orthopedics;  Laterality: Right;    Family History  Problem Relation Age of Onset   Heart attack Mother 45   Heart attack Father 57   Heart attack Sister 54   Heart attack Brother 70   Multiple sclerosis Daughter     Social History   Socioeconomic History   Marital status: Widowed    Spouse name: Not on file   Number of children: 2   Years of education: Not on file   Highest education level: Not on file  Occupational History   Occupation: On disability   Social Needs   Financial resource strain: Not on file  Food insecurity:    Worry: Not on file    Inability: Not on file   Transportation needs:    Medical: Not on file    Non-medical: Not on file  Tobacco Use   Smoking status: Never Smoker   Smokeless tobacco: Current User    Types: Chew  Substance and Sexual Activity   Alcohol use: No   Drug use: No   Sexual activity: Not on file  Lifestyle   Physical activity:    Days per week: Not on file    Minutes per session: Not on file   Stress: Not on file  Relationships   Social connections:    Talks on phone: Not on file    Gets together: Not on file    Attends religious service: Not on file    Active member of club or organization: Not on file    Attends meetings of clubs or organizations: Not on file    Relationship status: Not on file   Intimate partner violence:    Fear of current or ex partner: Not on file     Emotionally abused: Not on file    Physically abused: Not on file    Forced sexual activity: Not on file  Other Topics Concern   Not on file  Social History Narrative   Staying active    Outpatient Medications Prior to Visit  Medication Sig Dispense Refill   albuterol (PROVENTIL HFA;VENTOLIN HFA) 108 (90 Base) MCG/ACT inhaler Inhale 1-2 puffs into the lungs every 6 (six) hours as needed for wheezing or shortness of breath. 1 Inhaler 2   Carboxymethylcellul-Glycerin (LUBRICATING EYE DROPS OP) Place 1 drop into both eyes daily as needed (dry eyes).     carvedilol (COREG) 3.125 MG tablet Take 1 tablet (3.125 mg total) by mouth 2 (two) times daily with a meal. 180 tablet 3   clotrimazole-betamethasone (LOTRISONE) cream Apply 1 application topically daily as needed (rash).      meclizine (ANTIVERT) 25 MG tablet TAKE 1 TABLET THREE TIMES DAILY AS NEEDED  FOR  DIZZINESS  OR  NAUSEA  AS DIRECTED 90 tablet 1   Multiple Vitamin (MULTIVITAMIN WITH MINERALS) TABS tablet Take 1 tablet by mouth daily.     tiZANidine (ZANAFLEX) 4 MG tablet TAKE 1 TABLET BY MOUTH EVERY 8 HOURS AS NEEDED FOR MUSCLE SPASM  0   traMADol (ULTRAM) 50 MG tablet Take 1-2 tablets (50-100 mg total) by mouth 2 (two) times daily as needed. 360 tablet 1   vitamin B-12 (CYANOCOBALAMIN) 1000 MCG tablet Take 1,000 mcg by mouth daily.     warfarin (COUMADIN) 5 MG tablet Take 0.5-1 tablets (2.5-5 mg total) by mouth See admin instructions. Take 5 mg daily on Mon, Thurs, and Sat  Take 2.5 mg daily on Tues, Wed, Fri, and Sun 60 tablet 3   chlorpheniramine-HYDROcodone (TUSSIONEX) 10-8 MG/5ML SUER Take 5 mLs by mouth every 12 (twelve) hours as needed for cough (cough, will cause drowsiness.). 120 mL 0   doxazosin (CARDURA) 4 MG tablet Take 1 tablet (4 mg total) by mouth daily. 90 tablet 1   lisinopril (PRINIVIL,ZESTRIL) 10 MG tablet Take 1 tablet (10 mg total) by mouth daily. (Patient taking differently: Take 5 mg by mouth daily. )  90 tablet 1   potassium chloride (K-DUR,KLOR-CON) 10 MEQ tablet Take 1 tablet (10 mEq total) by mouth daily. 90 tablet 1   No facility-administered medications prior to visit.     Allergies  Allergen Reactions   Penicillins  Childhood allergy Has patient had a PCN reaction causing immediate rash, facial/tongue/throat swelling, SOB or lightheadedness with hypotension: Unknown Has patient had a PCN reaction causing severe rash involving mucus membranes or skin necrosis: Unknown Has patient had a PCN reaction that required hospitalization: Unknown Has patient had a PCN reaction occurring within the last 10 years: No If all of the above answers are "NO", then may proceed with Cephalosporin use.     ROS Review of Systems    Objective:    Physical Exam  Constitutional: He is oriented to person, place, and time. He appears well-developed and well-nourished.  HENT:  Head: Normocephalic and atraumatic.  Cardiovascular: Normal rate and normal heart sounds.  Irregular rhythm but rate controlled.DP pulses 2+ bilaterally.   Pulmonary/Chest: Effort normal and breath sounds normal.  Musculoskeletal:     Comments: 2+ pitting edema in the right lower extremity almost up to the knee and 1+ pitting edema on the left ankle.  Please see photographs of the wounds below.  The anterior wound did have some straw-colored seepage.  No drainage from the posterior wound but had significant erythema at the base.  Neurological: He is alert and oriented to person, place, and time.  Skin: Skin is warm and dry.  He has a few red petechiae on the left top of the foot  Psychiatric: He has a normal mood and affect. His behavior is normal.            BP 135/75    Pulse 92    Temp 98.1 F (36.7 C)    Ht 6\' 2"  (1.88 m)    Wt 293 lb (132.9 kg)    SpO2 100%    BMI 37.62 kg/m  Wt Readings from Last 3 Encounters:  04/06/19 293 lb (132.9 kg)  03/10/19 299 lb (135.6 kg)  02/04/19 294 lb (133.4 kg)      There are no preventive care reminders to display for this patient.  There are no preventive care reminders to display for this patient.  Lab Results  Component Value Date   TSH 3.18 04/30/2017   Lab Results  Component Value Date   WBC 13.1 (H) 08/02/2018   HGB 12.1 (L) 08/02/2018   HCT 35.5 (L) 08/02/2018   MCV 92.2 08/02/2018   PLT 167 08/02/2018   Lab Results  Component Value Date   NA 140 08/02/2018   K 4.5 08/02/2018   CO2 26 08/02/2018   GLUCOSE 186 (H) 08/02/2018   BUN 21 08/02/2018   CREATININE 0.94 08/02/2018   BILITOT 2.7 (H) 10/10/2017   ALKPHOS 68 04/30/2017   AST 20 10/10/2017   ALT 16 10/10/2017   PROT 5.9 (L) 10/10/2017   ALBUMIN 3.5 (L) 04/30/2017   CALCIUM 8.6 (L) 08/02/2018   ANIONGAP 6 08/02/2018   Lab Results  Component Value Date   CHOL 130 04/09/2018   Lab Results  Component Value Date   HDL 39 (L) 04/09/2018   Lab Results  Component Value Date   LDLCALC 77 04/09/2018   Lab Results  Component Value Date   TRIG 65 04/09/2018   Lab Results  Component Value Date   CHOLHDL 3.3 04/09/2018   Lab Results  Component Value Date   HGBA1C 4.9 10/06/2018      Assessment & Plan:   Problem List Items Addressed This Visit      Cardiovascular and Mediastinum   Venous (peripheral) insufficiency    Most likely cause of current swelling.  Continue wear compression  stockings and reduce salt intake.      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      Relevant Orders   COMPLETE METABOLIC PANEL WITH GFR   Lipid panel   PSA   ESSENTIAL HYPERTENSION, BENIGN - Primary    Quite as well-controlled as I would like.  And discussed that with blood pressures being a little elevated that that actually can worsen venous stasis.  He has been trying to lose weight which always seems to help.  Really make sure working on low-salt diet.  Work on add a few days of furosemide.  But we may need to adjust his overall regimen if not well  controlled.      Relevant Orders   COMPLETE METABOLIC PANEL WITH GFR   Lipid panel   PSA   Atrial fibrillation (HCC)    Table.  Asymptomatic.  On Coumadin.  INR 3.0 today.  Plan to recheck again in 3 weeks.      Relevant Orders   POCT INR (Completed)   COMPLETE METABOLIC PANEL WITH GFR   Lipid panel   PSA     Genitourinary   CKD (chronic kidney disease) stage 3, GFR 30-59 ml/min (HCC)    To recheck renal function.      Relevant Orders   COMPLETE METABOLIC PANEL WITH GFR   Lipid panel   PSA    Other Visit Diagnoses    Screening PSA (prostate specific antigen)       Relevant Orders   PSA   Wound of right lower extremity, initial encounter          Wound of right lower extremity-please see photographs above.  The one on the anterior side of the leg is actually weeping.  We discussed trying to elevate the legs making sure he is using his diuretic regularly which she says he is.  Continuing to wear the compression stockings and really trying to work to lose weight.  There always seems to be a direct correlation with his increased weight gain and onset of lower extremity edema.  He always has some trace pitting edema.  If not improving may need to consider Unna boots which we have also done in the past.  For now we will go ahead and place him on Bactrim DS to cover for MRSA.  Encouraged him to make sure that he is moisturizing his skin well.  For atrial fibrillation-see anticoagulation flowsheet for adjustments to Coumadin.  INR was 3.0 today.  Meds ordered this encounter  Medications   sulfamethoxazole-trimethoprim (BACTRIM DS) 800-160 MG tablet    Sig: Take 1 tablet by mouth 2 (two) times daily.    Dispense:  20 tablet    Refill:  0    Follow-up: Return in about 3 weeks (around 04/27/2019) for INR.    Beatrice Lecher, MD

## 2019-04-07 LAB — COMPLETE METABOLIC PANEL WITH GFR
AG Ratio: 1.7 (calc) (ref 1.0–2.5)
ALT: 23 U/L (ref 9–46)
AST: 23 U/L (ref 10–35)
Albumin: 4.1 g/dL (ref 3.6–5.1)
Alkaline phosphatase (APISO): 64 U/L (ref 35–144)
BUN: 18 mg/dL (ref 7–25)
CO2: 30 mmol/L (ref 20–32)
Calcium: 9.2 mg/dL (ref 8.6–10.3)
Chloride: 107 mmol/L (ref 98–110)
Creat: 0.86 mg/dL (ref 0.70–1.25)
GFR, Est African American: 103 mL/min/{1.73_m2} (ref >=60)
GFR, Est Non African American: 88 mL/min/{1.73_m2} (ref >=60)
Globulin: 2.4 g/dL (calc) (ref 1.9–3.7)
Glucose, Bld: 116 mg/dL — ABNORMAL HIGH (ref 65–99)
Potassium: 4.5 mmol/L (ref 3.5–5.3)
Sodium: 141 mmol/L (ref 135–146)
Total Bilirubin: 2 mg/dL — ABNORMAL HIGH (ref 0.2–1.2)
Total Protein: 6.5 g/dL (ref 6.1–8.1)

## 2019-04-07 LAB — LIPID PANEL
Cholesterol: 133 mg/dL (ref <200)
HDL: 38 mg/dL — ABNORMAL LOW (ref >=40)
LDL Cholesterol (Calc): 80 mg/dL (calc)
Non-HDL Cholesterol (Calc): 95 mg/dL (calc) (ref <130)
Total CHOL/HDL Ratio: 3.5 (calc) (ref <5.0)
Triglycerides: 70 mg/dL (ref <150)

## 2019-04-07 LAB — PSA: PSA: 2.1 ng/mL (ref <=4.0)

## 2019-04-09 DIAGNOSIS — M25562 Pain in left knee: Secondary | ICD-10-CM | POA: Diagnosis not present

## 2019-04-09 DIAGNOSIS — M25561 Pain in right knee: Secondary | ICD-10-CM | POA: Diagnosis not present

## 2019-04-16 ENCOUNTER — Other Ambulatory Visit: Payer: Self-pay | Admitting: Family Medicine

## 2019-04-29 ENCOUNTER — Encounter: Payer: Self-pay | Admitting: Family Medicine

## 2019-04-29 ENCOUNTER — Ambulatory Visit (INDEPENDENT_AMBULATORY_CARE_PROVIDER_SITE_OTHER): Payer: Medicare HMO | Admitting: Family Medicine

## 2019-04-29 VITALS — BP 131/81 | HR 90 | Ht 74.0 in | Wt 289.0 lb

## 2019-04-29 DIAGNOSIS — S81801A Unspecified open wound, right lower leg, initial encounter: Secondary | ICD-10-CM

## 2019-04-29 DIAGNOSIS — N183 Chronic kidney disease, stage 3 unspecified: Secondary | ICD-10-CM

## 2019-04-29 DIAGNOSIS — J61 Pneumoconiosis due to asbestos and other mineral fibers: Secondary | ICD-10-CM | POA: Diagnosis not present

## 2019-04-29 DIAGNOSIS — I482 Chronic atrial fibrillation, unspecified: Secondary | ICD-10-CM

## 2019-04-29 DIAGNOSIS — I10 Essential (primary) hypertension: Secondary | ICD-10-CM

## 2019-04-29 DIAGNOSIS — M19019 Primary osteoarthritis, unspecified shoulder: Secondary | ICD-10-CM

## 2019-04-29 LAB — POCT INR: INR: 2.2 (ref 2.0–3.0)

## 2019-04-29 MED ORDER — SULFAMETHOXAZOLE-TRIMETHOPRIM 800-160 MG PO TABS: 1.0000 | ORAL_TABLET | Freq: Two times a day (BID) | ORAL | 0 refills | Status: DC

## 2019-04-29 NOTE — Assessment & Plan Note (Signed)
Went for labs earlier this month.  Renal function is stable.  We will continue to monitor.

## 2019-04-29 NOTE — Assessment & Plan Note (Signed)
Blood pressure looks good today and he has been able to reduce some of the lower extremity edema which is been helpful.  Just encouraged him to continue to work on weight loss.  He says he has been trying.

## 2019-04-29 NOTE — Progress Notes (Signed)
Pt here for INR check no missed doses, diet changes,bruising,bleeding,CP,SOB..Gayleen Sholtz [name], CMA  

## 2019-04-29 NOTE — Progress Notes (Signed)
Established Patient Office Visit  Subjective:  Patient ID: Jerome Hicks, male    DOB: 1951/02/14  Age: 68 y.o. MRN: [id number]  CC:  Chief Complaint  Patient presents with  . Hypertension  . Atrial Fibrillation    HPI Jerome Hicks presents for   Wound on his leg that is still not healing.  It is a chronic wound that had started oozing last time I saw him. Placed him on Bactrim DS about 4 weeks ago.  It is still draining some blood.    Hypertension- Pt denies  dizziness, or heart palpitations.  Taking meds as directed w/o problems.  Denies medication  side effects.  He does occasionally experience some chest discomfort and shortness of breath.  He thinks it is because of his weight gain.  He says it can happen when he is at rest or with activity.  He also notes that his asbestosis is been getting worse over the years.  F/U OA of shoulders and knees -currently on tramadol for pain control.  Atrial fibrillation-currently on Coumadin for anticoagulation.  Taking medication regularly with no recent missed doses.  Past Medical History:  Diagnosis Date  . Arthritis    oa  . 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 Ascending aortic aneurysm ([place])    seen on 01-31-18 ct chest ; patient denies awareness  . Atrial fibrillation ([place])   . Cataracts, bilateral   . ED (erectile dysfunction)    has been prescribed Levitra and Viagrain in the past  . Hypertension   . Internal hemorrhoids   . Kidney stones   . Leg edema    recurrent cellulitis  . Scrotal abscess 3-03  . Shoulder arthritis   . Sigmoid diverticulosis     Past Surgical History:  Procedure Laterality Date  .  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 CHOLECYSTECTOMY  1971  . kidney stones  1990's   Ureteroscopic stone extraction  . TONSILLECTOMY    . 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   Procedure: RIGHT TOTAL KNEE ARTHROPLASTY;  Surgeon: Dorna Leitz, MD;  Location: [facility];   Service: Orthopedics;  Laterality: Right;    Family History  Problem Relation Age of Onset  . Heart attack Mother 42  . Heart attack Father 65  . Heart attack Sister 56  . Heart attack Brother 47  . Multiple sclerosis Daughter     Social History   Socioeconomic History  . Marital status: Widowed    Spouse name: Not on file  . Number of children: 2  . Years of education: Not on file  . Highest education level: Not on file  Occupational History  . Occupation: On disability   Social Needs  . Financial resource strain: Not on file  . Food insecurity:    Worry: Not on file    Inability: Not on file  . Transportation needs:    Medical: Not on file    Non-medical: Not on file  Tobacco Use  . Smoking status: Never Smoker  . Smokeless tobacco: Current User    Types: Chew  Substance and Sexual Activity  . Alcohol use: No  . Drug use: No  . Sexual activity: Not on file  Lifestyle  . Physical activity:    Days per week: Not on file    Minutes per session: Not on file  . Stress: Not on file  Relationships  . Social connections:    Talks on  phone: Not on file    Gets together: Not on file    Attends religious service: Not on file    Active member of club or organization: Not on file    Attends meetings of clubs or organizations: Not on file    Relationship status: Not on file  . Intimate partner violence:    Fear of current or ex partner: Not on file    Emotionally abused: Not on file    Physically abused: Not on file    Forced sexual activity: Not on file  Other Topics Concern  . Not on file  Social History Narrative   Staying active    Outpatient Medications Prior to Visit  Medication Sig Dispense Refill  . albuterol (PROVENTIL HFA;VENTOLIN HFA) 108 (90 Base) MCG/ACT inhaler Inhale 1-2 puffs into the lungs every 6 (six) hours as needed for wheezing or shortness of breath. 1 Inhaler 2  . Carboxymethylcellul-Glycerin (LUBRICATING EYE DROPS OP) Place 1 drop into both  eyes daily as needed (dry eyes).    . carvedilol (COREG) 3.125 MG tablet TAKE 1 TABLET (3.125 MG TOTAL) BY MOUTH 2 (TWO) TIMES DAILY WITH A MEAL. 180 tablet 3  . clotrimazole-betamethasone (LOTRISONE) cream Apply 1 application topically daily as needed (rash).     Marland Kitchen doxazosin (CARDURA) 4 MG tablet TAKE 1 TABLET EVERY DAY 90 tablet 1  . lisinopril (ZESTRIL) 10 MG tablet TAKE 1 TABLET EVERY DAY 90 tablet 1  . meclizine (ANTIVERT) 25 MG tablet TAKE 1 TABLET THREE TIMES DAILY AS NEEDED  FOR  DIZZINESS  OR  NAUSEA  AS DIRECTED 90 tablet 1  . Multiple Vitamin (MULTIVITAMIN WITH MINERALS) TABS tablet Take 1 tablet by mouth daily.    . potassium chloride (K-DUR) 10 MEQ tablet TAKE 1 TABLET EVERY DAY 90 tablet 1  . tiZANidine (ZANAFLEX) 4 MG tablet TAKE 1 TABLET BY MOUTH EVERY 8 HOURS AS NEEDED FOR MUSCLE SPASM  0  . traMADol (ULTRAM) 50 MG tablet Take 1-2 tablets (50-100 mg total) by mouth 2 (two) times daily as needed. 360 tablet 1  . vitamin B-12 (CYANOCOBALAMIN) 1000 MCG tablet Take 1,000 mcg by mouth daily.    Marland Kitchen warfarin (COUMADIN) 5 MG tablet Take 0.5-1 tablets (2.5-5 mg total) by mouth See admin instructions. Take 5 mg daily on Mon, Thurs, and Sat  Take 2.5 mg daily on Tues, Wed, Fri, and Sun 60 tablet 3  . sulfamethoxazole-trimethoprim (BACTRIM DS) 800-160 MG tablet Take 1 tablet by mouth 2 (two) times daily. 20 tablet 0   No facility-administered medications prior to visit.     Allergies  Allergen Reactions  . Penicillins     Childhood allergy Has patient had a PCN reaction causing immediate rash, facial/tongue/throat swelling, SOB or lightheadedness with hypotension: Unknown Has patient had a PCN reaction causing severe rash involving mucus membranes or skin necrosis: Unknown Has patient had a PCN reaction that required hospitalization: Unknown Has patient had a PCN reaction occurring within the last 10 years: No If all of the above answers are "NO", then may proceed with Cephalosporin  use.     ROS Review of Systems    Objective:    Physical Exam  Constitutional: He is oriented to person, place, and time. He appears well-developed and well-nourished.  HENT:  Head: Normocephalic and atraumatic.  Cardiovascular: Normal rate, regular rhythm and normal heart sounds.  Pulmonary/Chest: Effort normal and breath sounds normal.  Musculoskeletal:     Comments: Still some trace pitting edema in  the right ankle and lower leg.  No edema in the left lower leg.  Much improved from previous.  Neurological: He is alert and oriented to person, place, and time.  Skin: Skin is warm and dry.  On the left anterior shin he has an open wound that is measuring approximately 1.0 x 0.8 cm with a smaller oval-shaped lesion measuring approximately 0.4 x 0.6 cm in size.  It looks like it is healing well there is just some fresh blood where it looks like the scabs were removed.  He has a larger lesion on the posterior part of the leg that looks like it has closed there is still some thick scale over the area but there erythema that was present previously has resolved.  Psychiatric: He has a normal mood and affect. His behavior is normal.    BP 131/81   Pulse 90   Ht 6\' 2"  (1.88 m)   Wt 289 lb (131.1 kg)   SpO2 98%   BMI 37.11 kg/m  Wt Readings from Last 3 Encounters:  04/29/19 289 lb (131.1 kg)  04/06/19 293 lb (132.9 kg)  03/10/19 299 lb (135.6 kg)     There are no preventive care reminders to display for this patient.  There are no preventive care reminders to display for this patient.  Lab Results  Component Value Date   TSH 3.18 04/30/2017   Lab Results  Component Value Date   WBC 13.1 (H) 08/02/2018   HGB 12.1 (L) 08/02/2018   HCT 35.5 (L) 08/02/2018   MCV 92.2 08/02/2018   PLT 167 08/02/2018   Lab Results  Component Value Date   NA 141 04/06/2019   K 4.5 04/06/2019   CO2 30 04/06/2019   GLUCOSE 116 (H) 04/06/2019   BUN 18 04/06/2019   CREATININE 0.86 04/06/2019    BILITOT 2.0 (H) 04/06/2019   ALKPHOS 68 04/30/2017   AST 23 04/06/2019   ALT 23 04/06/2019   PROT 6.5 04/06/2019   ALBUMIN 3.5 (L) 04/30/2017   CALCIUM 9.2 04/06/2019   ANIONGAP 6 08/02/2018   Lab Results  Component Value Date   CHOL 133 04/06/2019   Lab Results  Component Value Date   HDL 38 (L) 04/06/2019   Lab Results  Component Value Date   LDLCALC 80 04/06/2019   Lab Results  Component Value Date   TRIG 70 04/06/2019   Lab Results  Component Value Date   CHOLHDL 3.5 04/06/2019   Lab Results  Component Value Date   HGBA1C 4.9 10/06/2018      Assessment & Plan:   Problem List Items Addressed This Visit      Cardiovascular and Mediastinum   ESSENTIAL HYPERTENSION, BENIGN    Blood pressure looks good today and he has been able to reduce some of the lower extremity edema which is been helpful.  Just encouraged him to continue to work on weight loss.  He says he has been trying.      Atrial fibrillation ([place]) - Primary   Relevant Orders   POCT INR (Completed)     Musculoskeletal and Integument   Glenohumeral arthritis of both shoulders    Uses tramadol as needed.  Doing well status post knee replacement.        Genitourinary   CKD (chronic kidney disease) stage 3, GFR 30-59 ml/min (HCC)    Went for labs earlier this month.  Renal function is stable.  We will continue to monitor.  Other Visit Diagnoses    Wound of right lower extremity, initial encounter         Wound is improving significantly.  He would like a second round of the Bactrim just to make sure that it is continuing to improve.  Encouraged him to continue to work on reducing the swelling and weight loss.  He is down 4 pounds from when I last saw him.  Meds ordered this encounter  Medications  . sulfamethoxazole-trimethoprim (BACTRIM DS) 800-160 MG tablet    Sig: Take 1 tablet by mouth 2 (two) times daily.    Dispense:  20 tablet    Refill:  0    Follow-up: Return in about 4  weeks (around 05/27/2019), or nurse visit for INR.    Beatrice Lecher, MD

## 2019-04-29 NOTE — Assessment & Plan Note (Signed)
Spirometry mostly showing restrictive defect.  Noticing some change in shortness of breath with activity.  Did encourage weight reduction which should help.  Continue to follow with Dr. Charlett Blake in [facility] yearly.

## 2019-04-29 NOTE — Assessment & Plan Note (Signed)
Uses tramadol as needed.  Doing well status post knee replacement.

## 2019-04-30 ENCOUNTER — Ambulatory Visit (INDEPENDENT_AMBULATORY_CARE_PROVIDER_SITE_OTHER): Payer: Medicare HMO | Admitting: Sports Medicine

## 2019-04-30 ENCOUNTER — Encounter: Payer: Self-pay | Admitting: Sports Medicine

## 2019-04-30 DIAGNOSIS — M19019 Primary osteoarthritis, unspecified shoulder: Secondary | ICD-10-CM | POA: Diagnosis not present

## 2019-04-30 NOTE — Assessment & Plan Note (Signed)
Repeat bilateral glenohumeral injections, previous injections were in May 2019.

## 2019-04-30 NOTE — Progress Notes (Signed)
Subjective:    CC: Bilateral shoulder pain  HPI: This is a pleasant 68 year old male, he has bilateral glenohumeral osteoarthritis, last injections were in May 2019, now having a recurrence of pain, moderate, persistent, localized at the joint lines without radiation, moderate gelling, worse with overhead activities.  No trauma, no constitutional symptoms.  I reviewed the past medical history, family history, social history, surgical history, and allergies today and no changes were needed.  Please see the problem list section below in epic for further details.  Past Medical History: Past Medical History:  Diagnosis Date  . Arthritis    oa  . 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 Ascending aortic aneurysm ([place])    seen on 01-31-18 ct chest ; patient denies awareness  . Atrial fibrillation ([place])   . Cataracts, bilateral   . ED (erectile dysfunction)    has been prescribed Levitra and Viagrain in the past  . Hypertension   . Internal hemorrhoids   . Kidney stones   . Leg edema    recurrent cellulitis  . Scrotal abscess 3-03  . Shoulder arthritis   . Sigmoid diverticulosis    Past Surgical History: Past Surgical History:  Procedure Laterality Date  .  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 CHOLECYSTECTOMY  1971  . kidney stones  1990's   Ureteroscopic stone extraction  . TONSILLECTOMY    . 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 Marital status: Widowed    Spouse name: Not on file  . Number of children: 2  . Years of education: Not on file  . Highest education level: Not on file  Occupational History  . Occupation: On disability   Social Needs  . Financial resource strain: Not on file  . Food insecurity:    Worry: Not on file    Inability: Not  on file  . Transportation needs:    Medical: Not on file    Non-medical: Not on file  Tobacco Use  . Smoking status: Never Smoker  . Smokeless tobacco: Current User    Types: Chew  Substance and Sexual Activity  . Alcohol use: No  . Drug use: No  . Sexual activity: Not on file  Lifestyle  . Physical activity:    Days per week: Not on file    Minutes per session: Not on file  . Stress: Not on file  Relationships  . Social connections:    Talks on phone: Not on file    Gets together: Not on file    Attends religious service: Not on file    Active member of club or organization: Not on file    Attends meetings of clubs or organizations: Not on file    Relationship status: Not on file  Other Topics Concern  . Not on file  Social History Narrative   Staying active   Family History: Family History  Problem Relation Age of Onset  . Heart attack Mother 42  . Heart attack Father 23  . Heart attack Sister 74  . Heart attack Brother 36  . Multiple sclerosis Daughter    Allergies: Allergies  Allergen Reactions  . Penicillins     Childhood allergy Has patient had a PCN reaction causing immediate rash, facial/tongue/throat swelling, SOB or lightheadedness  with hypotension: Unknown Has patient had a PCN reaction causing severe rash involving mucus membranes or skin necrosis: Unknown Has patient had a PCN reaction that required hospitalization: Unknown Has patient had a PCN reaction occurring within the last 10 years: No If all of the above answers are "NO", then may proceed with Cephalosporin use.    Medications: See med rec.  Review of Systems: No fevers, chills, night sweats, weight loss, chest pain, or shortness of breath.   Objective:    General: Well Developed, well nourished, and in no acute distress.  Neuro: Alert and oriented x3, extra-ocular muscles intact, sensation grossly intact.  HEENT: Normocephalic, atraumatic, pupils equal round reactive to light, neck  supple, no masses, no lymphadenopathy, thyroid nonpalpable.  Skin: Warm and dry, no rashes. Cardiac: Regular rate and rhythm, no murmurs rubs or gallops, no lower extremity edema.  Respiratory: Clear to auscultation bilaterally. Not using accessory muscles, speaking in full sentences.  Procedure: Real-time Ultrasound Guided injection of the left glenohumeral joint Device: GE Logiq E  Verbal informed consent obtained.  Time-out conducted.  Noted no overlying erythema, induration, or other signs of local infection.  Skin prepped in a sterile fashion.  Local anesthesia: Topical Ethyl chloride.  With sterile technique and under real time ultrasound guidance:  1 cc Kenalog 40, 2 cc lidocaine, 2 cc bupivacaine injected easily from a posterior approach Completed without difficulty  Pain immediately resolved suggesting accurate placement of the medication.  Advised to call if fevers/chills, erythema, induration, drainage, or persistent bleeding.  Images permanently stored and available for review in the ultrasound unit.  Impression: Technically successful ultrasound guided injection.  Procedure: Real-time Ultrasound Guided injection of the right glenohumeral joint Device: GE Logiq E  Verbal informed consent obtained.  Time-out conducted.  Noted no overlying erythema, induration, or other signs of local infection.  Skin prepped in a sterile fashion.  Local anesthesia: Topical Ethyl chloride.  With sterile technique and under real time ultrasound guidance:  1 cc Kenalog 40, 2 cc lidocaine, 2 cc bupivacaine injected easily from a posterior approach Completed without difficulty  Pain immediately resolved suggesting accurate placement of the medication.  Advised to call if fevers/chills, erythema, induration, drainage, or persistent bleeding.  Images permanently stored and available for review in the ultrasound unit.  Impression: Technically successful ultrasound guided injection.  Impression and  Recommendations:    Glenohumeral arthritis of both shoulders Repeat bilateral glenohumeral injections, previous injections were in May 2019.   ___________________________________________ Gwen Her. Dianah Field, M.D., ABFM., CAQSM. Primary Care and Sports Medicine [facility]  Adjunct Professor of [place] [employer]

## 2019-05-27 ENCOUNTER — Ambulatory Visit: Payer: Medicare HMO

## 2019-06-08 ENCOUNTER — Other Ambulatory Visit: Payer: Self-pay

## 2019-06-08 ENCOUNTER — Encounter: Payer: Self-pay | Admitting: Family Medicine

## 2019-06-08 ENCOUNTER — Ambulatory Visit (INDEPENDENT_AMBULATORY_CARE_PROVIDER_SITE_OTHER): Payer: Medicare HMO | Admitting: Family Medicine

## 2019-06-08 VITALS — BP 122/66 | HR 76 | Ht 74.0 in | Wt 292.0 lb

## 2019-06-08 DIAGNOSIS — Z1211 Encounter for screening for malignant neoplasm of colon: Secondary | ICD-10-CM

## 2019-06-08 DIAGNOSIS — Z0000 Encounter for general adult medical examination without abnormal findings: Secondary | ICD-10-CM

## 2019-06-08 DIAGNOSIS — R140 Abdominal distension (gaseous): Secondary | ICD-10-CM

## 2019-06-08 DIAGNOSIS — I482 Chronic atrial fibrillation, unspecified: Secondary | ICD-10-CM | POA: Diagnosis not present

## 2019-06-08 LAB — POCT INR: INR: 2.9 (ref 2.0–3.0)

## 2019-06-08 MED ORDER — AMBULATORY NON FORMULARY MEDICATION: 0 refills | Status: DC

## 2019-06-08 NOTE — Patient Instructions (Signed)
Health Maintenance After Age 69 After age 69, you are at a higher risk for certain long-term diseases and infections as well as injuries from falls. Falls are a major cause of broken bones and head injuries in people who are older than age 69. Getting regular preventive care can help to keep you healthy and well. Preventive care includes getting regular testing and making lifestyle changes as recommended by your health care provider. Talk with your health care provider about:  Which screenings and tests you should have. A screening is a test that checks for a disease when you have no symptoms.  A diet and exercise plan that is right for you. What should I know about screenings and tests to prevent falls? Screening and testing are the best ways to find a health problem early. Early diagnosis and treatment give you the best chance of managing medical conditions that are common after age 69. Certain conditions and lifestyle choices may make you more likely to have a fall. Your health care provider may recommend:  Regular vision checks. Poor vision and conditions such as cataracts can make you more likely to have a fall. If you wear glasses, make sure to get your prescription updated if your vision changes.  Medicine review. Work with your health care provider to regularly review all of the medicines you are taking, including over-the-counter medicines. Ask your health care provider about any side effects that may make you more likely to have a fall. Tell your health care provider if any medicines that you take make you feel dizzy or sleepy.  Osteoporosis screening. Osteoporosis is a condition that causes the bones to get weaker. This can make the bones weak and cause them to break more easily.  Blood pressure screening. Blood pressure changes and medicines to control blood pressure can make you feel dizzy.  Strength and balance checks. Your health care provider may recommend certain tests to check your  strength and balance while standing, walking, or changing positions.  Foot health exam. Foot pain and numbness, as well as not wearing proper footwear, can make you more likely to have a fall.  Depression screening. You may be more likely to have a fall if you have a fear of falling, feel emotionally low, or feel unable to do activities that you used to do.  Alcohol use screening. Using too much alcohol can affect your balance and may make you more likely to have a fall. What actions can I take to lower my risk of falls? General instructions  Talk with your health care provider about your risks for falling. Tell your health care provider if: ? You fall. Be sure to tell your health care provider about all falls, even ones that seem minor. ? You feel dizzy, sleepy, or off-balance.  Take over-the-counter and prescription medicines only as told by your health care provider. These include any supplements.  Eat a healthy diet and maintain a healthy weight. A healthy diet includes low-fat dairy products, low-fat (lean) meats, and fiber from whole grains, beans, and lots of fruits and vegetables. Home safety  Remove any tripping hazards, such as rugs, cords, and clutter.  Install safety equipment such as grab bars in bathrooms and safety rails on stairs.  Keep rooms and walkways well-lit. Activity   Follow a regular exercise program to stay fit. This will help you maintain your balance. Ask your health care provider what types of exercise are appropriate for you.  If you need a cane or   walker, use it as recommended by your health care provider.  Wear supportive shoes that have nonskid soles. Lifestyle  Do not drink alcohol if your health care provider tells you not to drink.  If you drink alcohol, limit how much you have: ? 0-1 drink a day for women. ? 0-2 drinks a day for men.  Be aware of how much alcohol is in your drink. In the [place] one drink equals one typical bottle of beer (12  oz), one-half glass of wine (5 oz), or one shot of hard liquor (1 oz).  Do not use any products that contain nicotine or tobacco, such as cigarettes and e-cigarettes. If you need help quitting, ask your health care provider. Summary  Having a healthy lifestyle and getting preventive care can help to protect your health and wellness after age 75.  Screening and testing are the best way to find a health problem early and help you avoid having a fall. Early diagnosis and treatment give you the best chance for managing medical conditions that are more common for people who are older than age 42.  Falls are a major cause of broken bones and head injuries in people who are older than age 31. Take precautions to prevent a fall at home.  Work with your health care provider to learn what changes you can make to improve your health and wellness and to prevent falls. This information is not intended to replace advice given to you by your health care provider. Make sure you discuss any questions you have with your health care provider. Document Released: 10/02/2017 Document Revised: 03/12/2019 Document Reviewed: 10/02/2017 Elsevier Patient Education  [place].  Abdominal Bloating When you have abdominal bloating, your abdomen may feel full, tight, or painful. It may also look bigger than normal or swollen (distended). Common causes of abdominal bloating include:  Swallowing air.  Constipation.  Problems digesting food.  Eating too much.  Irritable bowel syndrome. This is a condition that affects the large intestine.  Lactose intolerance. This is an inability to digest lactose, a natural sugar in dairy products.  Celiac disease. This is a condition that affects the ability to digest gluten, a protein found in some grains.  Gastroparesis. This is a condition that slows down the movement of food in the stomach and small intestine. It is more common in people with diabetes mellitus.   Gastroesophageal reflux disease (GERD). This is a digestive condition that makes stomach acid flow back into the esophagus.  Urinary retention. This means that the body is holding onto urine, and the bladder cannot be emptied all the way. Follow these instructions at home: Eating and drinking  Avoid eating too much.  Try not to swallow air while talking or eating.  Avoid eating while lying down.  Avoid these foods and drinks: ? Foods that cause gas, such as broccoli, cabbage, cauliflower, and baked beans. ? Carbonated drinks. ? Hard candy. ? Chewing gum. Medicines  Take over-the-counter and prescription medicines only as told by your health care provider.  Take probiotic medicines. These medicines contain live bacteria or yeasts that can help digestion.  Take coated peppermint oil capsules. Activity  Try to exercise regularly. Exercise may help to relieve bloating that is caused by gas and relieve constipation. General instructions  Keep all follow-up visits as told by your health care provider. This is important. Contact a health care provider if:  You have nausea and vomiting.  You have diarrhea.  You have abdominal  pain.  You have unusual weight loss or weight gain.  You have severe pain, and medicines do not help. Get help right away if:  You have severe chest pain.  You have trouble breathing.  You have shortness of breath.  You have trouble urinating.  You have darker urine than normal.  You have blood in your stools or have dark, tarry stools. Summary  Abdominal bloating means that the abdomen is swollen.  Common causes of abdominal bloating are swallowing air, constipation, and problems digesting food.  Avoid eating too much and avoid swallowing air.  Avoid foods that cause gas, carbonated drinks, hard candy, and chewing gum. This information is not intended to replace advice given to you by your health care provider. Make sure you discuss any  questions you have with your health care provider. Document Released: 12/21/2016 Document Revised: 03/09/2019 Document Reviewed: 12/21/2016 Elsevier Patient Education  2020 [employer].

## 2019-06-08 NOTE — Progress Notes (Signed)
Established Patient Office Visit  Subjective:  Patient ID: Jerome Hicks, male    DOB: [birth year]  Age: 68 y.o. MRN: [id number]  CC:  Chief Complaint  Patient presents with  . Annual Exam  . Coagulation Disorder    HPI Jerome Hicks presents for Wellness Exam.  He says he has been trying to exercise some but does occasionally get shortness of breath but this is chronic.  No chest pain with it.  Does report that he is had some difficulty with bloating mostly in the upper abdominal area above the bellybutton.  He says his bowel movements have been normal.  Denies any constipation.  He denies any reflux or belching.  Or heartburn.  He denies any recent medication changes or new medications.  He denies any blood in the stool he denies any nausea after eating.    Past Medical History:  Diagnosis Date  . Arthritis    oa  . 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 Ascending aortic aneurysm ([place])    seen on 01-31-18 ct chest ; patient denies awareness  . Atrial fibrillation ([place])   . Cataracts, bilateral   . ED (erectile dysfunction)    has been prescribed Levitra and Viagrain in the past  . Hypertension   . Internal hemorrhoids   . Kidney stones   . Leg edema    recurrent cellulitis  . Scrotal abscess 3-03  . Shoulder arthritis   . Sigmoid diverticulosis     Past Surgical History:  Procedure Laterality Date  .  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 CHOLECYSTECTOMY  1971  . kidney stones  1990's   Ureteroscopic stone extraction  . TONSILLECTOMY    . 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 Heart attack Mother 53  . Heart attack Father 35  . Heart attack Sister 104  . Heart attack Brother 30  . Multiple sclerosis Daughter     Social History    Socioeconomic History  . Marital status: Widowed    Spouse name: Not on file  . Number of children: 2  . Years of education: Not on file  . Highest education level: Not on file  Occupational History  . Occupation: On disability   Social Needs  . Financial resource strain: Not on file  . Food insecurity    Worry: Not on file    Inability: Not on file  . Transportation needs    Medical: Not on file    Non-medical: Not on file  Tobacco Use  . Smoking status: Never Smoker  . Smokeless tobacco: Current User    Types: Chew  Substance and Sexual Activity  . Alcohol use: No  . Drug use: No  . Sexual activity: Not on file  Lifestyle  . Physical activity    Days per week: Not on file    Minutes per session: Not on file  . Stress: Not on file  Relationships  . Social Herbalist on phone: Not on file    Gets together: Not on file    Attends religious service: Not on file    Active member of club or organization: Not on file    Attends meetings of clubs or organizations: Not on file  Relationship status: Not on file  . Intimate partner violence    Fear of current or ex partner: Not on file    Emotionally abused: Not on file    Physically abused: Not on file    Forced sexual activity: Not on file  Other Topics Concern  . Not on file  Social History Narrative   Staying active    Outpatient Medications Prior to Visit  Medication Sig Dispense Refill  . albuterol (PROVENTIL HFA;VENTOLIN HFA) 108 (90 Base) MCG/ACT inhaler Inhale 1-2 puffs into the lungs every 6 (six) hours as needed for wheezing or shortness of breath. 1 Inhaler 2  . Carboxymethylcellul-Glycerin (LUBRICATING EYE DROPS OP) Place 1 drop into both eyes daily as needed (dry eyes).    . carvedilol (COREG) 3.125 MG tablet TAKE 1 TABLET (3.125 MG TOTAL) BY MOUTH 2 (TWO) TIMES DAILY WITH A MEAL. 180 tablet 3  . clotrimazole-betamethasone (LOTRISONE) cream Apply 1 application topically daily as needed (rash).      Marland Kitchen doxazosin (CARDURA) 4 MG tablet TAKE 1 TABLET EVERY DAY 90 tablet 1  . lisinopril (ZESTRIL) 10 MG tablet TAKE 1 TABLET EVERY DAY 90 tablet 1  . meclizine (ANTIVERT) 25 MG tablet TAKE 1 TABLET THREE TIMES DAILY AS NEEDED  FOR  DIZZINESS  OR  NAUSEA  AS DIRECTED 90 tablet 1  . Multiple Vitamin (MULTIVITAMIN WITH MINERALS) TABS tablet Take 1 tablet by mouth daily.    . potassium chloride (K-DUR) 10 MEQ tablet TAKE 1 TABLET EVERY DAY 90 tablet 1  . sulfamethoxazole-trimethoprim (BACTRIM DS) 800-160 MG tablet Take 1 tablet by mouth 2 (two) times daily. 20 tablet 0  . tiZANidine (ZANAFLEX) 4 MG tablet TAKE 1 TABLET BY MOUTH EVERY 8 HOURS AS NEEDED FOR MUSCLE SPASM  0  . traMADol (ULTRAM) 50 MG tablet Take 1-2 tablets (50-100 mg total) by mouth 2 (two) times daily as needed. 360 tablet 1  . vitamin B-12 (CYANOCOBALAMIN) 1000 MCG tablet Take 1,000 mcg by mouth daily.    Marland Kitchen warfarin (COUMADIN) 5 MG tablet Take 0.5-1 tablets (2.5-5 mg total) by mouth See admin instructions. Take 5 mg daily on Mon, Thurs, and Sat  Take 2.5 mg daily on Tues, Wed, Fri, and Sun 60 tablet 3   No facility-administered medications prior to visit.     Allergies  Allergen Reactions  . Penicillins     Childhood allergy Has patient had a PCN reaction causing immediate rash, facial/tongue/throat swelling, SOB or lightheadedness with hypotension: Unknown Has patient had a PCN reaction causing severe rash involving mucus membranes or skin necrosis: Unknown Has patient had a PCN reaction that required hospitalization: Unknown Has patient had a PCN reaction occurring within the last 10 years: No If all of the above answers are "NO", then may proceed with Cephalosporin use.     ROS Review of Systems    Objective:    Physical Exam  BP 122/66   Pulse 76   Ht 6\' 2"  (1.88 m)   Wt 292 lb (132.5 kg)   SpO2 99%   BMI 37.49 kg/m  Wt Readings from Last 3 Encounters:  06/08/19 292 lb (132.5 kg)  04/30/19 291 lb (132 kg)   04/29/19 289 lb (131.1 kg)     Health Maintenance Due  Topic Date Due  . COLONOSCOPY  05/26/2019    There are no preventive care reminders to display for this patient.  Lab Results  Component Value Date   TSH 3.18 04/30/2017   Lab Results  Component Value Date   WBC 13.1 (H) 08/02/2018   HGB 12.1 (L) 08/02/2018   HCT 35.5 (L) 08/02/2018   MCV 92.2 08/02/2018   PLT 167 08/02/2018   Lab Results  Component Value Date   NA 141 04/06/2019   K 4.5 04/06/2019   CO2 30 04/06/2019   GLUCOSE 116 (H) 04/06/2019   BUN 18 04/06/2019   CREATININE 0.86 04/06/2019   BILITOT 2.0 (H) 04/06/2019   ALKPHOS 68 04/30/2017   AST 23 04/06/2019   ALT 23 04/06/2019   PROT 6.5 04/06/2019   ALBUMIN 3.5 (L) 04/30/2017   CALCIUM 9.2 04/06/2019   ANIONGAP 6 08/02/2018   Lab Results  Component Value Date   CHOL 133 04/06/2019   Lab Results  Component Value Date   HDL 38 (L) 04/06/2019   Lab Results  Component Value Date   LDLCALC 80 04/06/2019   Lab Results  Component Value Date   TRIG 70 04/06/2019   Lab Results  Component Value Date   CHOLHDL 3.5 04/06/2019   Lab Results  Component Value Date   HGBA1C 4.9 10/06/2018      Assessment & Plan:   Problem List Items Addressed This Visit      Cardiovascular and Mediastinum   Atrial fibrillation ([place])   Relevant Orders   POCT INR (Completed)    Other Visit Diagnoses    Wellness examination    -  Primary   Screening for malignant neoplasm of colon       Relevant Orders   Ambulatory referral to Gastroenterology   Bloating         Keep up a regular exercise program and make sure you are eating a healthy diet Try to eat 4 servings of dairy a day, or if you are lactose intolerant take a calcium with vitamin D daily.  Your vaccines are up to date. Tdap ordered. Will need to get done at pharmacy.   Due for colon cancer screening. Will place referral. He is due for 3 year followup.  A copy of the form letter from digestive  health.  Were going to place referral.  Bloating - recommend a trial of a probiotic.  Unclear etiology.  If any new symptoms, develops pain or nausea or vomiting or change in bowel movements then please let us know.     Meds ordered this encounter  Medications  . AMBULATORY NON FORMULARY MEDICATION    Sig: Medication Name: Tdap x 1 IM    Dispense:  1 each    Refill:  0    Follow-up: No follow-ups on file.    Beatrice Lecher, MD

## 2019-06-08 NOTE — Progress Notes (Signed)
Pt here for INR check no missed doses, diet changes,bruising,bleeding,CP,SOB..Jerome Hicks [name], CMA  

## 2019-06-10 ENCOUNTER — Ambulatory Visit: Payer: Medicare HMO

## 2019-06-16 ENCOUNTER — Other Ambulatory Visit: Payer: Self-pay | Admitting: Family Medicine

## 2019-06-22 ENCOUNTER — Telehealth: Payer: Self-pay | Admitting: Family Medicine

## 2019-06-22 MED ORDER — TRAMADOL HCL 50 MG PO TABS: 50.0000 mg | ORAL_TABLET | Freq: Three times a day (TID) | ORAL | 0 refills | Status: DC | PRN

## 2019-06-22 NOTE — Telephone Encounter (Signed)
Received notification from his pharmacy that they will only pay for 7-day supply and so he was unable to fill the full prescription for tramadol.  Thus will send over new prescription to the pharmacy for the additional amount.  I did change the Sigg on it as his last prescription actually lasted 5 months.

## 2019-07-06 ENCOUNTER — Ambulatory Visit (INDEPENDENT_AMBULATORY_CARE_PROVIDER_SITE_OTHER): Payer: Medicare HMO | Admitting: Physician Assistant

## 2019-07-06 ENCOUNTER — Other Ambulatory Visit: Payer: Self-pay

## 2019-07-06 DIAGNOSIS — I482 Chronic atrial fibrillation, unspecified: Secondary | ICD-10-CM

## 2019-07-06 LAB — POCT INR: INR: 2.7 (ref 2.0–3.0)

## 2019-07-06 NOTE — Patient Instructions (Signed)
Same dose. 5 mg on Mon , Wed, Fri, and Sun  1/2 tab all other days.  Return in 4 weeks.

## 2019-07-06 NOTE — Progress Notes (Signed)
Same dose. 5 mg on Mon , Wed, Fri, and Sun  1/2 tab all other days.  Return in 4 weeks.

## 2019-07-13 NOTE — Progress Notes (Signed)
Subjective:   Jerome Hicks is a 68 y.o. male who presents for Medicare Annual/Subsequent preventive examination.  Review of Systems:  No ROS.  Medicare Wellness Virtual Visit.  Visual/audio telehealth visit, UTA vital signs.   See social history for additional risk factors.    Cardiac Risk Factors include: advanced age (>79men, >58 women);hypertension;male gender;sedentary lifestyle;obesity (BMI >30kg/m2)  Sleep patterns: getting at least 8 hours of sleep a night. Wakes up 1 times a night to void. Wakes up and feels refreshed for the most part. Home Safety/Smoke Alarms: Feels safe in home. Smoke alarms in place.  Living environment; Lives with girlfriend in 1 story home. No steps in or around the home. Shower is a walk in shower with grab bars and bench in place. Seat Belt Safety/Bike Helmet: Wears seat belt.    Male:   CCS-  Done 07/14/2019 at [facility]   PSA-  UTD Lab Results  Component Value Date   PSA 2.1 04/06/2019   PSA 2.2 04/09/2018   PSA 1.9 04/30/2017        Objective:    Vitals: BP 117/69 (BP Location: Left Arm, Patient Position: Sitting, Cuff Size: Large)   Pulse 82   Temp 98.3 F (36.8 C) (Oral)   Ht 6\' 2"  (1.88 m)   Wt 289 lb (131.1 kg)   SpO2 97%   BMI 37.11 kg/m   Body mass index is 37.11 kg/m.  Advanced Directives 07/15/2019 08/20/2018 08/01/2018 08/01/2018 07/25/2018 04/18/2015 04/15/2014  Does Patient Have a Medical Advance Directive? Yes Yes - Yes Yes Yes Patient has advance directive, copy not in chart  Type of Advance Directive [place];Living will [place];Living will Living will - Living will - -  Does patient want to make changes to medical advance directive? No - Patient declined - No - Patient declined - No - Patient declined - -  Copy of [place] in Chart? No - copy requested - - - - Yes Copy requested from family  Would patient like information on creating a medical advance  directive? - - - - - - -    Tobacco Social History   Tobacco Use  Smoking Status Never Smoker  Smokeless Tobacco Current User  . Types: Chew     Ready to quit: Not Answered Counseling given: Not Answered   Clinical Intake:  Pre-visit preparation completed: Yes  Pain : No/denies pain     Nutritional Risks: None Diabetes: No  How often do you need to have someone help you when you read instructions, pamphlets, or other written materials from your doctor or pharmacy?: 1 - Never What is the last grade level you completed in school?: 14  Interpreter Needed?: No  Information entered by :: Orlie Dakin, LPN  Past Medical History:  Diagnosis Date  . Arthritis    oa  . 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 Ascending aortic aneurysm ([place])    seen on 01-31-18 ct chest ; patient denies awareness  . Atrial fibrillation ([place])   . Cataracts, bilateral   . ED (erectile dysfunction)    has been prescribed Levitra and Viagrain in the past  . Hypertension   . Internal hemorrhoids   . Kidney stones   . Leg edema    recurrent cellulitis  . Scrotal abscess 3-03  . Shoulder arthritis   . Sigmoid diverticulosis    Past Surgical History:  Procedure Laterality Date  .  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 CHOLECYSTECTOMY  1971  . kidney stones  1990's   Ureteroscopic stone extraction  . TONSILLECTOMY    . 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 Heart attack Mother 83  . Heart attack Father 28  . Heart attack Sister 21  . Heart attack Brother 64  . Multiple sclerosis Daughter    Social History   Socioeconomic History  . Marital status: Widowed    Spouse name: Not on file  . Number of children: 2  . Years of education: 48  . Highest education level:  Associate degree: occupational, Hotel manager, or vocational program  Occupational History  . Occupation: On disability     Comment: retired  Scientific laboratory technician  . Financial resource strain: Not hard at all  . Food insecurity    Worry: Never true    Inability: Never true  . Transportation needs    Medical: No    Non-medical: No  Tobacco Use  . Smoking status: Never Smoker  . Smokeless tobacco: Current User    Types: Chew  Substance and Sexual Activity  . Alcohol use: No  . Drug use: No  . Sexual activity: Not Currently  Lifestyle  . Physical activity    Days per week: 0 days    Minutes per session: 0 min  . Stress: Not at all  Relationships  . Social connections    Talks on phone: More than three times a week    Gets together: Once a week    Attends religious service: Never    Active member of club or organization: No    Attends meetings of clubs or organizations: Never    Relationship status: Widowed  Other Topics Concern  . Not on file  Social History Narrative   Staying active   Splits wood    Outpatient Encounter Medications as of 07/15/2019  Medication Sig  . albuterol (PROVENTIL HFA;VENTOLIN HFA) 108 (90 Base) MCG/ACT inhaler Inhale 1-2 puffs into the lungs every 6 (six) hours as needed for wheezing or shortness of breath.  . AMBULATORY NON FORMULARY MEDICATION Medication Name: Tdap x 1 IM  . Carboxymethylcellul-Glycerin (LUBRICATING EYE DROPS OP) Place 1 drop into both eyes daily as needed (dry eyes).  . carvedilol (COREG) 3.125 MG tablet TAKE 1 TABLET (3.125 MG TOTAL) BY MOUTH 2 (TWO) TIMES DAILY WITH A MEAL.  Marland Kitchen doxazosin (CARDURA) 4 MG tablet TAKE 1 TABLET EVERY DAY  . lisinopril (ZESTRIL) 10 MG tablet TAKE 1 TABLET EVERY DAY  . meclizine (ANTIVERT) 25 MG tablet TAKE 1 TABLET THREE TIMES DAILY AS NEEDED  FOR  DIZZINESS  OR  NAUSEA  AS DIRECTED  . Multiple Vitamin (MULTIVITAMIN WITH MINERALS) TABS tablet Take 1 tablet by mouth daily.  . potassium chloride (K-DUR) 10 MEQ  tablet TAKE 1 TABLET EVERY DAY  . traMADol (ULTRAM) 50 MG tablet Take 1 tablet (50 mg total) by mouth every 8 (eight) hours as needed.  . vitamin B-12 (CYANOCOBALAMIN) 1000 MCG tablet Take 1,000 mcg by mouth daily.  Marland Kitchen warfarin (COUMADIN) 5 MG tablet Take 0.5-1 tablets (2.5-5 mg total) by mouth See admin instructions. Take 5 mg daily on Mon, Thurs, and Sat  Take 2.5 mg daily on Tues, Wed, Fri, and Sun  . clotrimazole-betamethasone (LOTRISONE) cream Apply  1 application topically daily as needed (rash).   . [DISCONTINUED] sulfamethoxazole-trimethoprim (BACTRIM DS) 800-160 MG tablet Take 1 tablet by mouth 2 (two) times daily.  . [DISCONTINUED] tiZANidine (ZANAFLEX) 4 MG tablet TAKE 1 TABLET BY MOUTH EVERY 8 HOURS AS NEEDED FOR MUSCLE SPASM   No facility-administered encounter medications on file as of 07/15/2019.     Activities of Daily Living In your present state of health, do you have any difficulty performing the following activities: 07/15/2019 08/01/2018  Hearing? Y N  Comment bilateral hearing loss -  Vision? Y N  Comment haas cataracts bilaterally -  Difficulty concentrating or making decisions? N N  Walking or climbing stairs? N Y  Dressing or bathing? N N  Doing errands, shopping? N N  Preparing Food and eating ? N -  Using the Toilet? N -  In the past six months, have you accidently leaked urine? N -  Do you have problems with loss of bowel control? N -  Managing your Medications? N -  Managing your Finances? N -  Some recent data might be hidden    Patient Care Team: Hali Marry, MD as PCP - General   Assessment:   This is a routine wellness examination for [place].Physical assessment deferred to PCP.   Exercise Activities and Dietary recommendations Current Exercise Habits: The patient does not participate in regular exercise at present, Exercise limited by: Other - see comments(asbestosis) Diet eats a fairly healthy diet of protein, vegetables and  fruits. Breakfast:cereal or banana and grapes or eggs with tomato Lunch: skip Dinner:  Meat and vegetables, mostly bakes all meats instead of frying . Drinks water daily    Goals    . Weight (lb) < 200 lb (90.7 kg)     Wants to loose weight. States wants to loose 100lbs.       Fall Risk Fall Risk  07/15/2019 06/08/2019 06/03/2018 04/09/2018 04/25/2017  Falls in the past year? 0 0 No No No  Number falls in past yr: - 0 - - -  Injury with Fall? 0 0 - - -  Follow up Falls prevention discussed - - - -   Is the patient's home free of loose throw rugs in walkways, pet beds, electrical cords, etc?   yes      Grab bars in the bathroom? yes      Handrails on the stairs?   no      Adequate lighting?   yes   Depression Screen PHQ 2/9 Scores 07/15/2019 06/08/2019 06/03/2018 04/09/2018  PHQ - 2 Score 0 0 0 0  PHQ- 9 Score - - 3 -    Cognitive Function     6CIT Screen 07/15/2019 06/08/2019 06/03/2018 04/25/2017  What Year? 0 points 0 points 0 points 0 points  What month? 0 points 0 points 0 points 0 points  What time? 0 points 0 points 0 points 0 points  Count back from 20 0 points 0 points 0 points 0 points  Months in reverse 0 points 0 points 0 points 0 points  Repeat phrase 0 points 4 points 4 points 6 points  Total Score 0 4 4 6     Immunization History  Administered Date(s) Administered  . Influenza Split 08/21/2012  . Influenza Whole 08/27/2008, 11/07/2009, 10/18/2010, 08/20/2011  . Influenza, High Dose Seasonal PF 08/26/2017, 08/08/2018  . Influenza,inj,Quad PF,6+ Mos 08/25/2013, 08/05/2014, 08/31/2015, 08/28/2016  . Pneumococcal Conjugate-13 09/29/2013  . Pneumococcal Polysaccharide-23 07/14/2015  . Td 12/03/1994, 06/15/2009  .  Zoster 06/18/2011  . Zoster Recombinat (Shingrix) 06/07/2018, 08/21/2018    Screening Tests Health Maintenance  Topic Date Due  . COLONOSCOPY  05/26/2019  . TETANUS/TDAP  06/16/2019  . INFLUENZA VACCINE  07/04/2019  . Hepatitis C Screening  Completed  .  PNA vac Low Risk Adult  Completed        Plan:    Mr. Burgett , Thank you for taking time to come for your Medicare Wellness Visit. I appreciate your ongoing commitment to your health goals. Please review the following plan we discussed and let me know if I can assist you in the future. Please schedule your next medicare wellness visit with me in 1 yr. Continue doing brain stimulating activities (puzzles, reading, adult coloring books, staying active) to keep memory sharp.  Bring a copy of your living will and/or healthcare power of attorney to your next office visit.     These are the goals we discussed: Goals    . Weight (lb) < 200 lb (90.7 kg)     Wants to loose weight. States wants to loose 100lbs.       This is a list of the screening recommended for you and due dates:  Health Maintenance  Topic Date Due  . Colon Cancer Screening  05/26/2019  . Tetanus Vaccine  06/16/2019  . Flu Shot  07/04/2019  .  Hepatitis C: One time screening is recommended by Center for Disease Control  (CDC) for  adults born from [birth year] through 1966.   Completed  . Pneumonia vaccines  Completed     I have personally reviewed and noted the following in the patient's chart:   . Medical and social history . Use of alcohol, tobacco or illicit drugs  . Current medications and supplements . Functional ability and status . Nutritional status . Physical activity . Advanced directives . List of other physicians . Hospitalizations, surgeries, and ER visits in previous 12 months . Vitals . Screenings to include cognitive, depression, and falls . Referrals and appointments  In addition, I have reviewed and discussed with patient certain preventive protocols, quality metrics, and best practice recommendations. A written personalized care plan for preventive services as well as general preventive health recommendations were provided to patient.     Joanne Chars, LPN  04/06/6978

## 2019-07-14 DIAGNOSIS — D125 Benign neoplasm of sigmoid colon: Secondary | ICD-10-CM | POA: Diagnosis not present

## 2019-07-14 DIAGNOSIS — D124 Benign neoplasm of descending colon: Secondary | ICD-10-CM | POA: Diagnosis not present

## 2019-07-14 DIAGNOSIS — Z86010 Personal history of colonic polyps: Secondary | ICD-10-CM | POA: Diagnosis not present

## 2019-07-14 DIAGNOSIS — D122 Benign neoplasm of ascending colon: Secondary | ICD-10-CM | POA: Diagnosis not present

## 2019-07-14 LAB — HM COLONOSCOPY

## 2019-07-15 ENCOUNTER — Ambulatory Visit (INDEPENDENT_AMBULATORY_CARE_PROVIDER_SITE_OTHER): Payer: Medicare HMO | Admitting: *Deleted

## 2019-07-15 VITALS — BP 117/69 | HR 82 | Temp 98.3°F | Ht 74.0 in | Wt 289.0 lb

## 2019-07-15 DIAGNOSIS — Z0000 Encounter for general adult medical examination without abnormal findings: Secondary | ICD-10-CM

## 2019-07-15 NOTE — Patient Instructions (Addendum)
Jerome Hicks , Thank you for taking time to come for your Medicare Wellness Visit. I appreciate your ongoing commitment to your health goals. Please review the following plan we discussed and let me know if I can assist you in the future. Please schedule your next medicare wellness visit with me in 1 yr. Continue doing brain stimulating activities (puzzles, reading, adult coloring books, staying active) to keep memory sharp.  Bring a copy of your living will and/or healthcare power of attorney to your next office visit. These are the goals we discussed: Goals    . Weight (lb) < 200 lb (90.7 kg)     Wants to loose weight. States wants to loose 100lbs.

## 2019-08-03 ENCOUNTER — Ambulatory Visit (INDEPENDENT_AMBULATORY_CARE_PROVIDER_SITE_OTHER): Payer: Medicare HMO | Admitting: Family Medicine

## 2019-08-03 DIAGNOSIS — I482 Chronic atrial fibrillation, unspecified: Secondary | ICD-10-CM | POA: Diagnosis not present

## 2019-08-03 DIAGNOSIS — Z23 Encounter for immunization: Secondary | ICD-10-CM

## 2019-08-03 LAB — POCT INR: INR: 2.7 (ref 2.0–3.0)

## 2019-09-01 ENCOUNTER — Other Ambulatory Visit: Payer: Self-pay

## 2019-09-01 ENCOUNTER — Ambulatory Visit (INDEPENDENT_AMBULATORY_CARE_PROVIDER_SITE_OTHER): Payer: Medicare HMO | Admitting: Family Medicine

## 2019-09-01 ENCOUNTER — Ambulatory Visit: Payer: Self-pay | Admitting: Family Medicine

## 2019-09-01 DIAGNOSIS — I482 Chronic atrial fibrillation, unspecified: Secondary | ICD-10-CM | POA: Diagnosis not present

## 2019-09-01 LAB — POCT INR: INR: 3.4 — AB (ref 2.0–3.0)

## 2019-09-01 NOTE — Progress Notes (Signed)
error 

## 2019-09-01 NOTE — Progress Notes (Signed)
See anticoag flowsheet

## 2019-09-07 ENCOUNTER — Other Ambulatory Visit: Payer: Self-pay | Admitting: Family Medicine

## 2019-09-07 MED ORDER — TRAMADOL HCL 50 MG PO TABS: 50.0000 mg | ORAL_TABLET | Freq: Three times a day (TID) | ORAL | 0 refills | Status: DC | PRN

## 2019-09-07 NOTE — Telephone Encounter (Signed)
Pt called. He needs a refill on his Tramadol.  Thank you.

## 2019-09-07 NOTE — Telephone Encounter (Signed)
RX pended   Last RF 06/22/19

## 2019-09-15 ENCOUNTER — Ambulatory Visit (INDEPENDENT_AMBULATORY_CARE_PROVIDER_SITE_OTHER): Payer: Medicare HMO | Admitting: Family Medicine

## 2019-09-15 ENCOUNTER — Other Ambulatory Visit: Payer: Self-pay

## 2019-09-15 DIAGNOSIS — I4891 Unspecified atrial fibrillation: Secondary | ICD-10-CM

## 2019-09-15 LAB — POCT INR: INR: 3.1 — AB (ref 2.0–3.0)

## 2019-09-15 NOTE — Progress Notes (Signed)
Patient advised.

## 2019-09-18 IMAGING — CR DG CHEST 2V
2 series · 2 of 2 positions shown · non-contrast
Comparison: None.

CLINICAL DATA: Preoperative evaluation for upcoming knee
replacement

EXAM:
CHEST - 2 VIEW

[w chest pa]
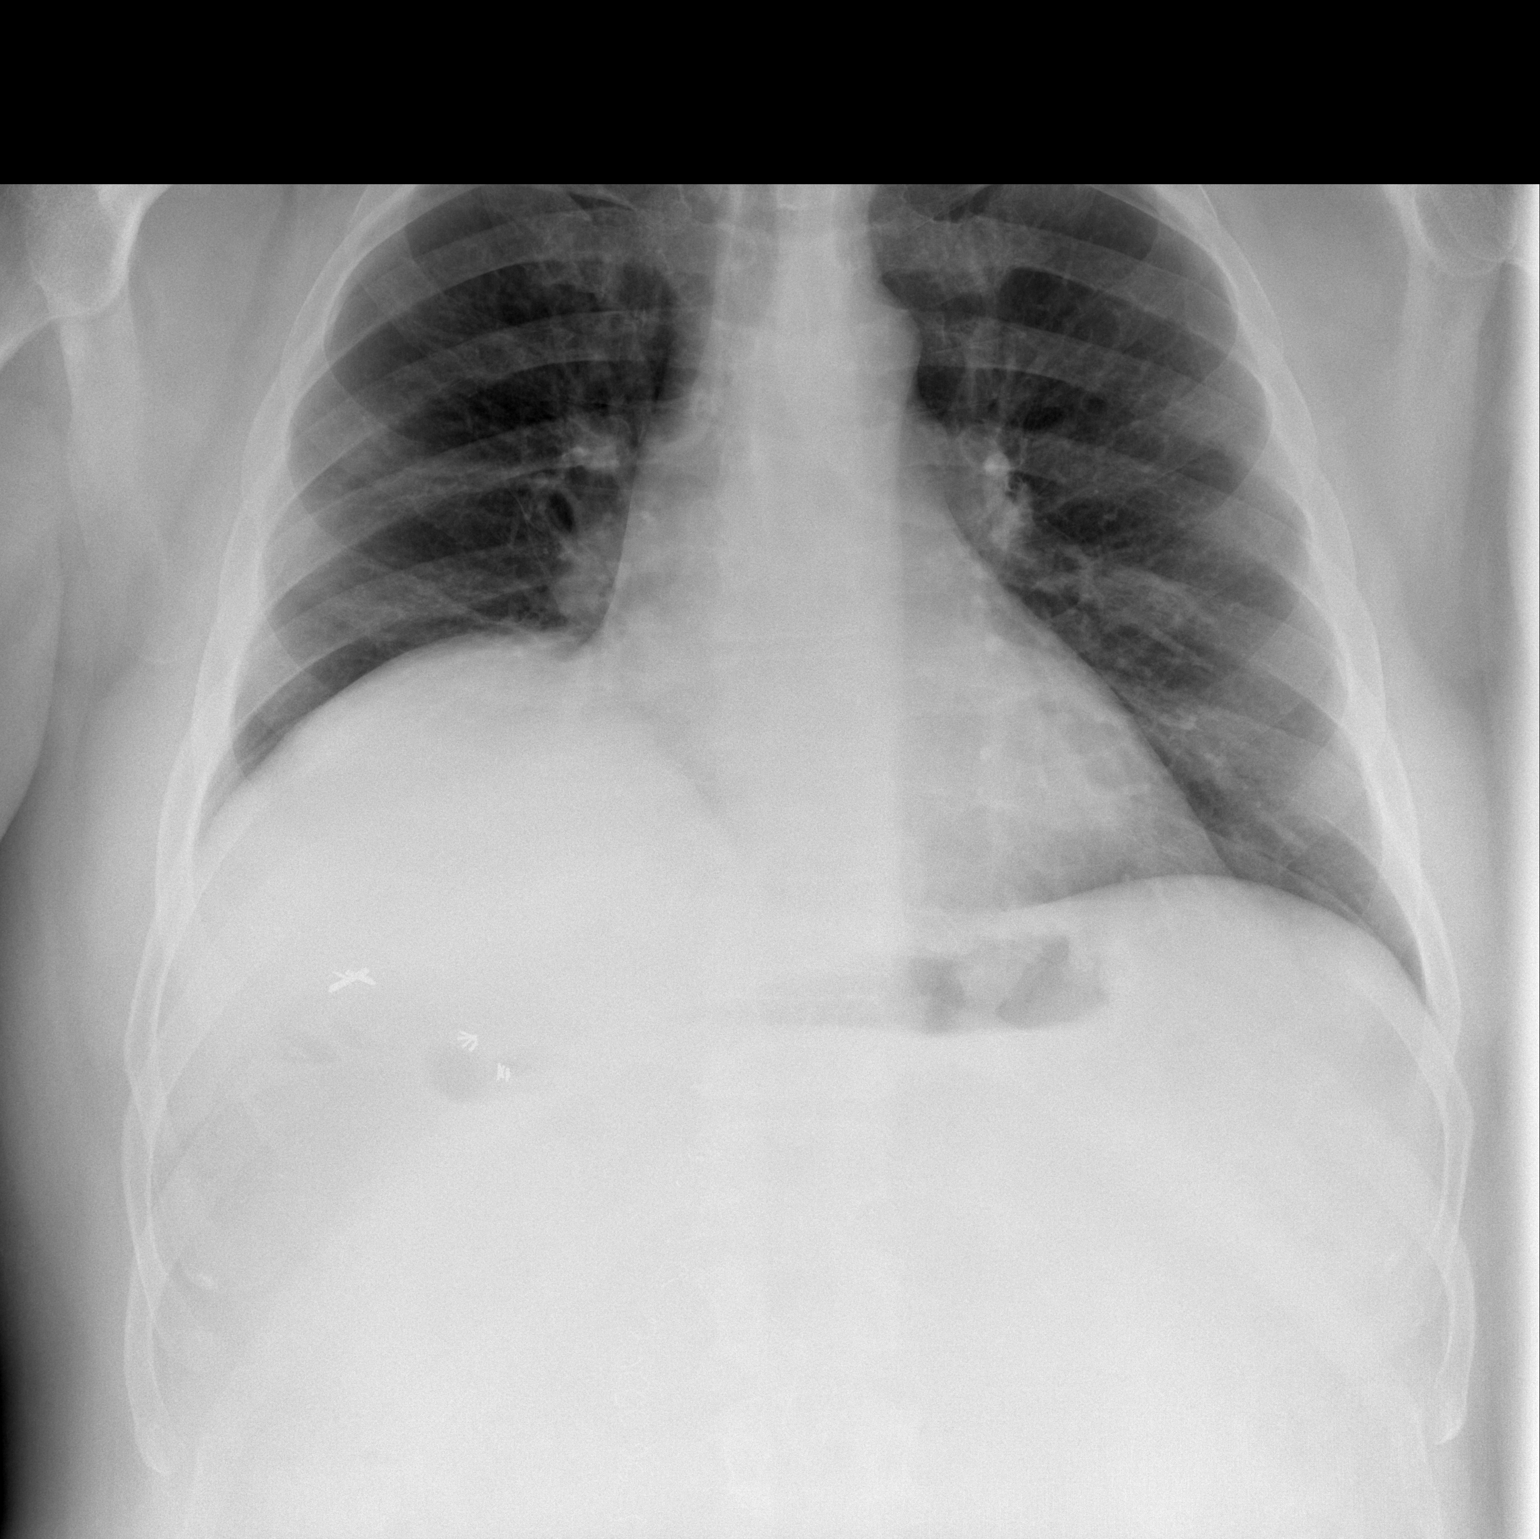

[w chest lat]
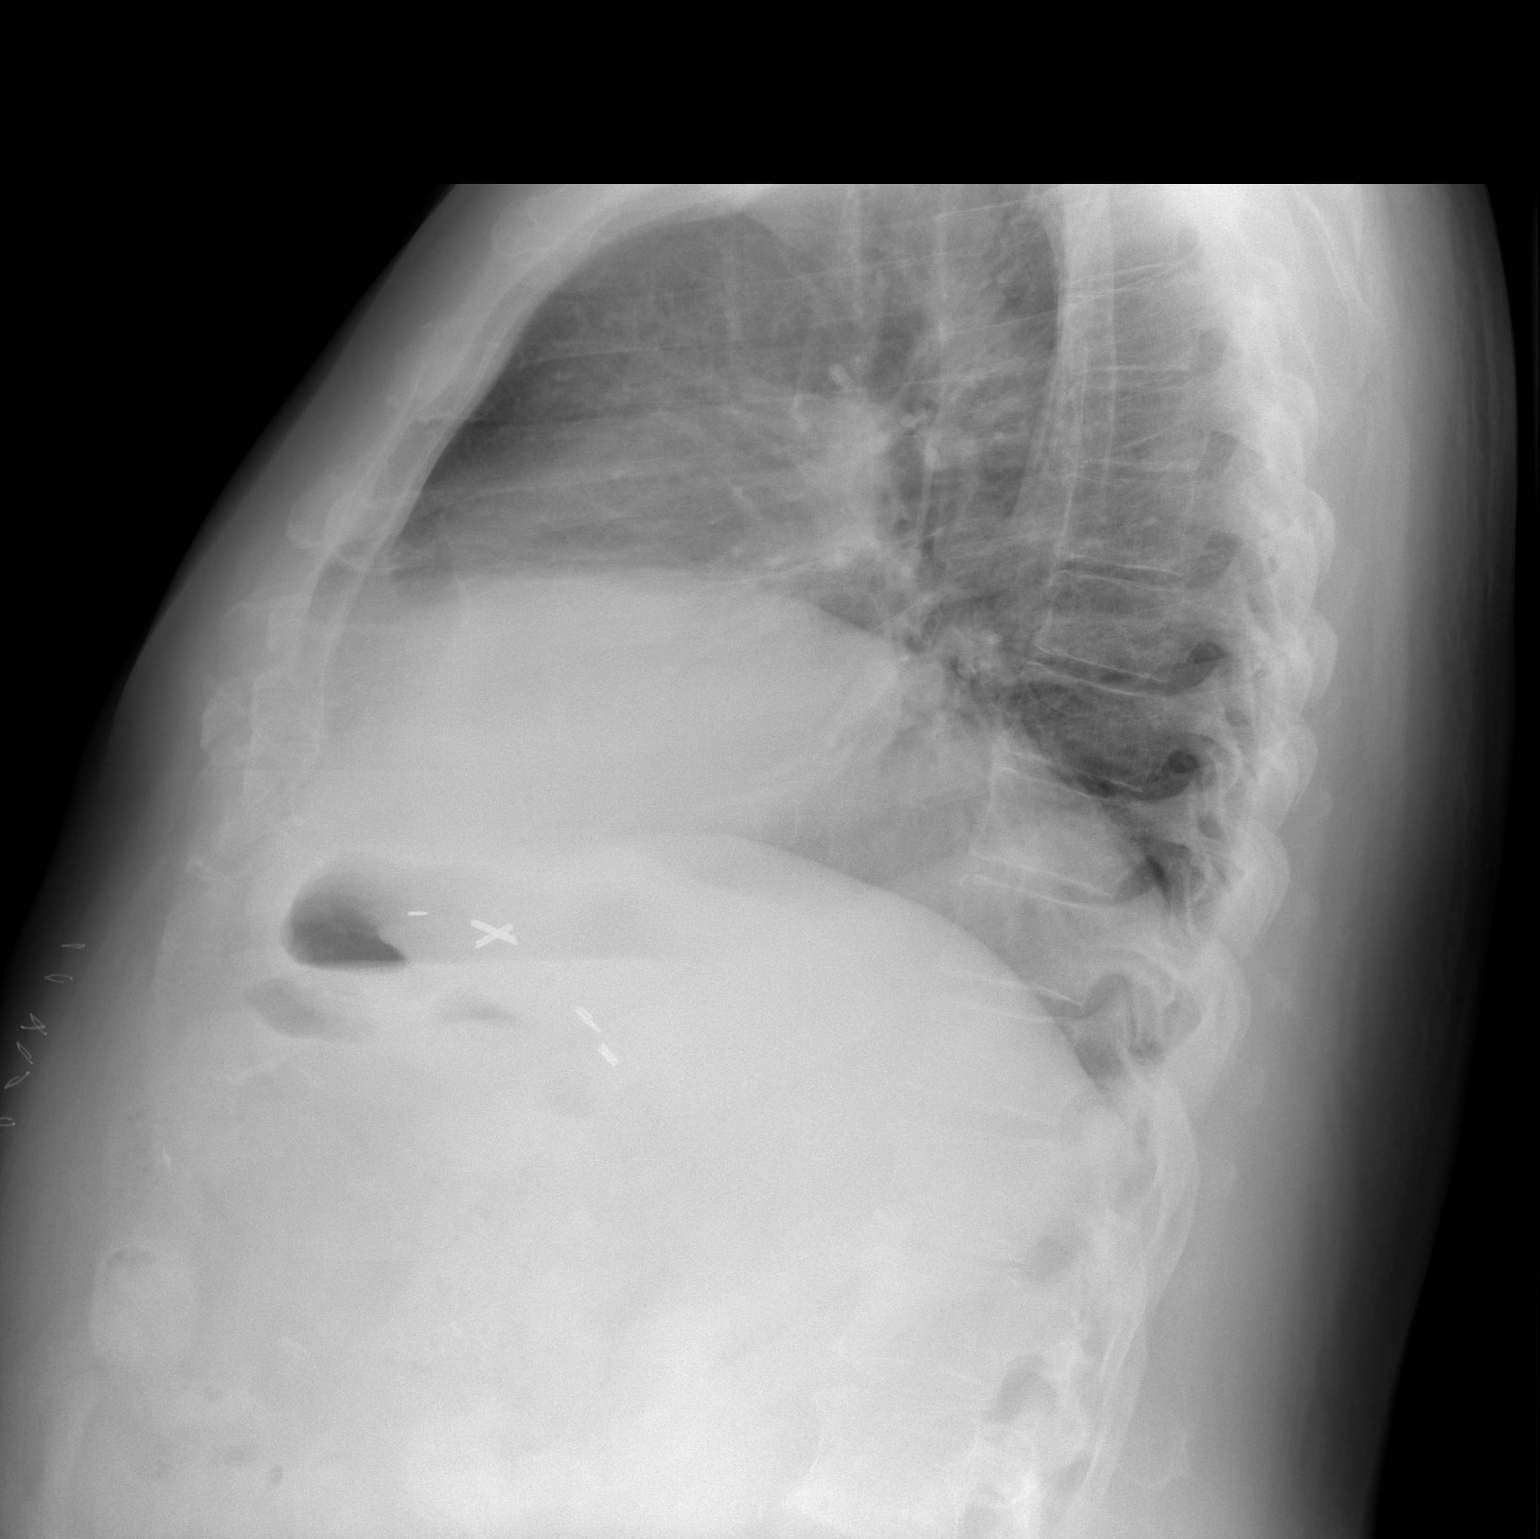

[2 of 2 positions shown; findings below may reference images not displayed]

FINDINGS: The heart size and mediastinal contours are within normal limits.
Both lungs are clear. The visualized skeletal structures are
unremarkable. Postsurgical changes in the upper abdomen are noted
consistent with prior cholecystectomy.
IMPRESSION: No active cardiopulmonary disease.

## 2019-09-23 DIAGNOSIS — H25811 Combined forms of age-related cataract, right eye: Secondary | ICD-10-CM | POA: Diagnosis not present

## 2019-09-23 DIAGNOSIS — Z01818 Encounter for other preprocedural examination: Secondary | ICD-10-CM | POA: Diagnosis not present

## 2019-09-29 ENCOUNTER — Other Ambulatory Visit: Payer: Self-pay

## 2019-09-29 ENCOUNTER — Ambulatory Visit (INDEPENDENT_AMBULATORY_CARE_PROVIDER_SITE_OTHER): Payer: Medicare HMO | Admitting: Family Medicine

## 2019-09-29 DIAGNOSIS — I4891 Unspecified atrial fibrillation: Secondary | ICD-10-CM | POA: Diagnosis not present

## 2019-09-29 LAB — POCT INR: INR: 2.1 (ref 2.0–3.0)

## 2019-09-29 NOTE — Progress Notes (Signed)
Patient presents to clinic for a INR check. Patient reports taking 5 mg on Monday, Thursday, and Saturday. He reports taking 2.5 mg on Tuesday, Wednesday, Friday and Sunday. He denies any recent hospitalizations. Patient denies any change in diet, any upcoming dental appointments, any recent hospitalizations, no missed doses and no major bleeding. PCP was aware of results and she recommends the patient come back in 2 weeks and stay on same dose. Patient was advised. He did not have any further questions and the patient made a follow up for a nurse visit in 2 weeks.

## 2019-09-29 NOTE — Patient Instructions (Signed)
Patient was instructed to keep on the current dose and to follow up in 2 weeks.

## 2019-10-05 DIAGNOSIS — H2511 Age-related nuclear cataract, right eye: Secondary | ICD-10-CM | POA: Diagnosis not present

## 2019-10-05 DIAGNOSIS — H25811 Combined forms of age-related cataract, right eye: Secondary | ICD-10-CM | POA: Diagnosis not present

## 2019-10-07 ENCOUNTER — Encounter: Payer: Self-pay | Admitting: Family Medicine

## 2019-10-13 ENCOUNTER — Other Ambulatory Visit: Payer: Self-pay

## 2019-10-13 ENCOUNTER — Ambulatory Visit (INDEPENDENT_AMBULATORY_CARE_PROVIDER_SITE_OTHER): Payer: Medicare HMO | Admitting: Family Medicine

## 2019-10-13 DIAGNOSIS — I4891 Unspecified atrial fibrillation: Secondary | ICD-10-CM

## 2019-10-13 LAB — POCT INR: INR: 2.5 (ref 2.0–3.0)

## 2019-10-13 NOTE — Progress Notes (Signed)
I have left a message for the patient to call back  

## 2019-10-13 NOTE — Progress Notes (Signed)
Patient in for INR check today. His goal is 2-3. He was 2.5 today.  Patient reports taking 5 mg on Monday, Thursday, and Saturday. He reports taking 2.5 mg on Tuesday, Wednesday, Friday and Sunday. He denies any recent hospitalizations, no diet changes,no bleeding or bruising, and no missed doses. Patient was instructed to make a follow up in 2 weeks and I would call with any changes to his regimen and he voices understanding.

## 2019-10-13 NOTE — Progress Notes (Signed)
Agree with below.  Continue current regimen.  Recheck in 2 to 3 weeks.

## 2019-10-13 NOTE — Patient Instructions (Signed)
Patient will come back in 2 weeks and I will call with any changes to the medication.

## 2019-10-14 NOTE — Progress Notes (Signed)
Patient has called back for a follow up and did not have any questions about his medications. Closing encounter.

## 2019-10-14 NOTE — Progress Notes (Signed)
Left detailed message for the patient with PCP recommendations. Patient was asked to call back for a follow up nurse visit in 2 weeks.

## 2019-10-19 DIAGNOSIS — H2512 Age-related nuclear cataract, left eye: Secondary | ICD-10-CM | POA: Diagnosis not present

## 2019-10-19 DIAGNOSIS — H25812 Combined forms of age-related cataract, left eye: Secondary | ICD-10-CM | POA: Diagnosis not present

## 2019-10-27 ENCOUNTER — Ambulatory Visit (INDEPENDENT_AMBULATORY_CARE_PROVIDER_SITE_OTHER): Payer: Medicare HMO | Admitting: Family Medicine

## 2019-10-27 ENCOUNTER — Other Ambulatory Visit: Payer: Self-pay

## 2019-10-27 DIAGNOSIS — I4891 Unspecified atrial fibrillation: Secondary | ICD-10-CM

## 2019-10-27 LAB — POCT INR: INR: 2.4 (ref 2.0–3.0)

## 2019-10-27 NOTE — Progress Notes (Signed)
Patient presents to clinic for a INR check. Patient reports goal is 2-3. He was 2.4 today.Patient reports taking 5 mg on Monday, Thursday, and Saturday. He reports taking 2.5 mg on Tuesday, Wednesday, Friday and Sunday. He denies any recent hospitalizations, no diet changes,no bleeding or bruising, and no missed doses. Patient was instructed to make a follow up in 2 weeks and I would call with any changes to his regimen and he voices understanding.

## 2019-10-27 NOTE — Patient Instructions (Signed)
Please follow up in 2 weeks for a INR check and I will call with any changes to the dosages.

## 2019-10-27 NOTE — Progress Notes (Signed)
F/U in 3 months.

## 2019-11-11 ENCOUNTER — Ambulatory Visit (INDEPENDENT_AMBULATORY_CARE_PROVIDER_SITE_OTHER): Payer: Medicare HMO | Admitting: Family Medicine

## 2019-11-11 DIAGNOSIS — I4891 Unspecified atrial fibrillation: Secondary | ICD-10-CM | POA: Diagnosis not present

## 2019-11-11 LAB — POCT INR: INR: 2.8 (ref 2.0–3.0)

## 2019-11-11 NOTE — Progress Notes (Signed)
Patient presents to clinic for a INR check. Patient reports goal is 2-3. He was 2.8 today.Patient reports taking 5 mg on Monday, Thursday, and Saturday. He reports taking 2.5 mg on Tuesday, Wednesday, Friday and Sunday. He denies any recent hospitalizations, no diet changes,no bleeding or bruising, and no missed doses. Patient was instructed to make a follow up in 3 weeks and I would call with any changes to his regimen and he voices understanding. No other questions.

## 2019-11-11 NOTE — Patient Instructions (Signed)
[facility] called and instructed to come back in 3 weeks for a repeat INR check.

## 2019-11-30 ENCOUNTER — Other Ambulatory Visit: Payer: Self-pay

## 2019-11-30 MED ORDER — TRAMADOL HCL 50 MG PO TABS: 50.0000 mg | ORAL_TABLET | Freq: Three times a day (TID) | ORAL | 0 refills | Status: DC | PRN

## 2019-12-01 ENCOUNTER — Ambulatory Visit (INDEPENDENT_AMBULATORY_CARE_PROVIDER_SITE_OTHER): Payer: Medicare HMO | Admitting: Family Medicine

## 2019-12-01 ENCOUNTER — Other Ambulatory Visit: Payer: Self-pay

## 2019-12-01 DIAGNOSIS — I4891 Unspecified atrial fibrillation: Secondary | ICD-10-CM

## 2019-12-01 LAB — POCT INR: INR: 2.4 (ref 2.0–3.0)

## 2019-12-01 NOTE — Progress Notes (Signed)
Agree with documentation as above.  Okay to follow-up in 4 weeks.  Beatrice Lecher, MD

## 2019-12-01 NOTE — Progress Notes (Signed)
Patient presents to clinic for a INR check. Patient reports goal is 2-3. He was 2.4 today.Patient reports taking 5 mg on Monday, Thursday, and Saturday. He reports taking 2.5 mg on Tuesday, Wednesday, Friday and Sunday. His goal is 2-3. He denies any recent hospitalizations, no diet changes,no bleeding or bruising, and no missed doses. Patient was instructed to make a follow up in 3 weeks and I would call with any changes to his regimen and he voices understanding.  Patient wants to set a follow up with you next month when he is due for the INR check.

## 2019-12-03 ENCOUNTER — Other Ambulatory Visit: Payer: Self-pay | Admitting: Family Medicine

## 2019-12-03 DIAGNOSIS — I10 Essential (primary) hypertension: Secondary | ICD-10-CM

## 2019-12-30 NOTE — Progress Notes (Signed)
Established Patient Office Visit  Subjective:  Patient ID: Jerome Hicks, male    DOB: 09/30/1951  Age: 69 y.o. MRN: [id number]  CC:  Chief Complaint  Patient presents with  . Hypertension  . Atrial Fibrillation    HPI Jerome Hicks presents for   Hypertension- Pt denies chest pain, SOB, dizziness, or heart palpitations.  Taking meds as directed w/o problems.  Denies medication side effects.    F/U CKD - No recent changes to urination, etc.   F/U Afib - he is taking 2.5mg  4 days and 5 mg the other 3 days.    He does report that he does feel like over the last couple years he has had some increasing shortness of breath particularly with activity.  He has a history of asbestosis and goes yearly for his PFTs he says he really has not had a significant change in the last 5 years.  He says he rarely uses his albuterol because it was so expensive.  More recently he has lost about 10 pounds as he and his girlfriend decided to cut out breads and sugary drinks.  And they are mostly eating baked and grilled foods as well as try to increase his veggie intake.  Last PFTs were in April 2020.  Reports his lower extremity swelling is okay.  Occasionally he also gets some blisters particularly on that right lower leg.  He states he then pops them and they form little scabs.  He still has persistent pain and swelling in that right knee status post full knee replacement but does have a follow-up with orthopedist in April.   Past Medical History:  Diagnosis Date  . Arthritis    oa  . 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 Ascending aortic aneurysm ([place])    seen on 01-31-18 ct chest ; patient denies awareness  . Atrial fibrillation ([place])   . Cataracts, bilateral   . ED (erectile dysfunction)    has been prescribed Levitra and Viagrain in the past  . Hypertension   . Internal hemorrhoids   . Kidney stones   . Leg edema    recurrent cellulitis  . Scrotal abscess 3-03  . Shoulder arthritis    . Sigmoid diverticulosis     Past Surgical History:  Procedure Laterality Date  .  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 CHOLECYSTECTOMY  1971  . kidney stones  1990's   Ureteroscopic stone extraction  . TONSILLECTOMY    . 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 Heart attack Mother 33  . Heart attack Father 46  . Heart attack Sister 36  . Heart attack Brother 45  . Multiple sclerosis Daughter     Social History   Socioeconomic History  . Marital status: Widowed    Spouse name: Not on file  . Number of children: 2  . Years of education: 69  . Highest education level: Associate degree: occupational, Hotel manager, or vocational program  Occupational History  . Occupation: On disability     Comment: retired  Tobacco Use  . Smoking status: Never Smoker  . Smokeless tobacco: Current User    Types: Chew  Substance and Sexual Activity  . Alcohol use: No  . Drug use: No  . Sexual  activity: Not Currently  Other Topics Concern  . Not on file  Social History Narrative   Staying active   Splits wood   Social Determinants of Health   Financial Resource Strain: Low Risk   . Difficulty of Paying Living Expenses: Not hard at all  Food Insecurity: No Food Insecurity  . Worried About Charity fundraiser in the Last Year: Never true  . Ran Out of Food in the Last Year: Never true  Transportation Needs: No Transportation Needs  . Lack of Transportation (Medical): No  . Lack of Transportation (Non-Medical): No  Physical Activity: Inactive  . Days of Exercise per Week: 0 days  . Minutes of Exercise per Session: 0 min  Stress: No Stress Concern Present  . Feeling of Stress : Not at all  Social Connections: Moderately Isolated  . Frequency of Communication with  Friends and Family: More than three times a week  . Frequency of Social Gatherings with Friends and Family: Once a week  . Attends Religious Services: Never  . Active Member of Clubs or Organizations: No  . Attends Archivist Meetings: Never  . Marital Status: Widowed  Intimate Partner Violence: Not At Risk  . Fear of Current or Ex-Partner: No  . Emotionally Abused: No  . Physically Abused: No  . Sexually Abused: No    Outpatient Medications Prior to Visit  Medication Sig Dispense Refill  . albuterol (PROVENTIL HFA;VENTOLIN HFA) 108 (90 Base) MCG/ACT inhaler Inhale 1-2 puffs into the lungs every 6 (six) hours as needed for wheezing or shortness of breath. 1 Inhaler 2  . AMBULATORY NON FORMULARY MEDICATION Medication Name: Tdap x 1 IM 1 each 0  . Carboxymethylcellul-Glycerin (LUBRICATING EYE DROPS OP) Place 1 drop into both eyes daily as needed (dry eyes).    . carvedilol (COREG) 3.125 MG tablet TAKE 1 TABLET (3.125 MG TOTAL) BY MOUTH 2 (TWO) TIMES DAILY WITH A MEAL. 180 tablet 3  . clotrimazole-betamethasone (LOTRISONE) cream Apply 1 application topically daily as needed (rash).     Marland Kitchen doxazosin (CARDURA) 4 MG tablet TAKE 1 TABLET EVERY DAY 90 tablet 1  . lisinopril (ZESTRIL) 10 MG tablet TAKE 1 TABLET EVERY DAY 90 tablet 1  . meclizine (ANTIVERT) 25 MG tablet TAKE 1 TABLET THREE TIMES DAILY AS NEEDED  FOR  DIZZINESS  OR  NAUSEA  AS DIRECTED 90 tablet 1  . Multiple Vitamin (MULTIVITAMIN WITH MINERALS) TABS tablet Take 1 tablet by mouth daily.    . potassium chloride (KLOR-CON) 10 MEQ tablet TAKE 1 TABLET EVERY DAY 90 tablet 1  . traMADol (ULTRAM) 50 MG tablet Take 1 tablet (50 mg total) by mouth every 8 (eight) hours as needed. 90 tablet 0  . vitamin B-12 (CYANOCOBALAMIN) 1000 MCG tablet Take 1,000 mcg by mouth daily.    Marland Kitchen warfarin (COUMADIN) 5 MG tablet Take 0.5-1 tablets (2.5-5 mg total) by mouth See admin instructions. Take 5 mg daily on Mon, Thurs, and Sat  Take 2.5 mg daily  on Tues, Wed, Fri, and Sun 60 tablet 3   No facility-administered medications prior to visit.    Allergies  Allergen Reactions  . Penicillins     Childhood allergy Has patient had a PCN reaction causing immediate rash, facial/tongue/throat swelling, SOB or lightheadedness with hypotension: Unknown Has patient had a PCN reaction causing severe rash involving mucus membranes or skin necrosis: Unknown Has patient had a PCN reaction that required hospitalization: Unknown Has patient had a PCN reaction occurring within  the last 10 years: No If all of the above answers are "NO", then may proceed with Cephalosporin use.     ROS Review of Systems    Objective:    Physical Exam  Constitutional: He is oriented to person, place, and time. He appears well-developed and well-nourished.  HENT:  Head: Normocephalic and atraumatic.  Cardiovascular: Normal rate, regular rhythm and normal heart sounds.  Pulmonary/Chest: Effort normal and breath sounds normal.  Neurological: He is alert and oriented to person, place, and time.  Skin: Skin is warm and dry.  Psychiatric: He has a normal mood and affect. His behavior is normal.    BP 125/75   Pulse 89   Ht 6\' 2"  (1.88 m)   Wt 292 lb (132.5 kg)   SpO2 100%   BMI 37.49 kg/m  Wt Readings from Last 3 Encounters:  12/31/19 292 lb (132.5 kg)  12/01/19 296 lb (134.3 kg)  11/11/19 296 lb (134.3 kg)     Health Maintenance Due  Topic Date Due  . TETANUS/TDAP  06/16/2019    There are no preventive care reminders to display for this patient.  Lab Results  Component Value Date   TSH 3.18 04/30/2017   Lab Results  Component Value Date   WBC 13.1 (H) 08/02/2018   HGB 12.1 (L) 08/02/2018   HCT 35.5 (L) 08/02/2018   MCV 92.2 08/02/2018   PLT 167 08/02/2018   Lab Results  Component Value Date   NA 141 04/06/2019   K 4.5 04/06/2019   CO2 30 04/06/2019   GLUCOSE 116 (H) 04/06/2019   BUN 18 04/06/2019   CREATININE 0.86 04/06/2019    BILITOT 2.0 (H) 04/06/2019   ALKPHOS 68 04/30/2017   AST 23 04/06/2019   ALT 23 04/06/2019   PROT 6.5 04/06/2019   ALBUMIN 3.5 (L) 04/30/2017   CALCIUM 9.2 04/06/2019   ANIONGAP 6 08/02/2018   Lab Results  Component Value Date   CHOL 133 04/06/2019   Lab Results  Component Value Date   HDL 38 (L) 04/06/2019   Lab Results  Component Value Date   LDLCALC 80 04/06/2019   Lab Results  Component Value Date   TRIG 70 04/06/2019   Lab Results  Component Value Date   CHOLHDL 3.5 04/06/2019   Lab Results  Component Value Date   HGBA1C 4.9 10/06/2018      Assessment & Plan:   Problem List Items Addressed This Visit      Cardiovascular and Mediastinum   ESSENTIAL HYPERTENSION, BENIGN - Primary    Well controlled. Continue current regimen. Follow up in  6 months.       Relevant Orders   BASIC METABOLIC PANEL WITH GFR   Atrial fibrillation ([place])   Relevant Orders   POCT INR (Completed)     Respiratory   Asbestosis ([place])    He does feel like he is getting more short of breath with activities gradually over time.  Denies any cough or sputum production.  We discussed using his albuterol before activities such as splitting wood etc. and seeing if that helps.  Let him know that albuterol now has a generic available gets.  Gets yearly PFTs with Dr. Charlett Blake in [place].        Genitourinary   CKD (chronic kidney disease) stage 3, GFR 30-59 ml/min    Due to check BMP        Other   Lower leg edema    Will.  No recent flares  or exacerbations.         No orders of the defined types were placed in this encounter.   Follow-up: Return in about 6 weeks (around 02/11/2020) for Check coumadin with nurse visit. Beatrice Lecher, MD

## 2019-12-31 ENCOUNTER — Other Ambulatory Visit: Payer: Self-pay

## 2019-12-31 ENCOUNTER — Ambulatory Visit (INDEPENDENT_AMBULATORY_CARE_PROVIDER_SITE_OTHER): Payer: Medicare HMO | Admitting: Family Medicine

## 2019-12-31 ENCOUNTER — Encounter: Payer: Self-pay | Admitting: Family Medicine

## 2019-12-31 VITALS — BP 125/75 | HR 89 | Ht 74.0 in | Wt 292.0 lb

## 2019-12-31 DIAGNOSIS — J61 Pneumoconiosis due to asbestos and other mineral fibers: Secondary | ICD-10-CM

## 2019-12-31 DIAGNOSIS — I10 Essential (primary) hypertension: Secondary | ICD-10-CM | POA: Diagnosis not present

## 2019-12-31 DIAGNOSIS — R600 Localized edema: Secondary | ICD-10-CM | POA: Diagnosis not present

## 2019-12-31 DIAGNOSIS — I4891 Unspecified atrial fibrillation: Secondary | ICD-10-CM | POA: Diagnosis not present

## 2019-12-31 DIAGNOSIS — N1831 Chronic kidney disease, stage 3a: Secondary | ICD-10-CM | POA: Diagnosis not present

## 2019-12-31 LAB — BASIC METABOLIC PANEL WITH GFR
BUN: 20 mg/dL (ref 7–25)
CO2: 27 mmol/L (ref 20–32)
Calcium: 9 mg/dL (ref 8.6–10.3)
Chloride: 107 mmol/L (ref 98–110)
Creat: 0.85 mg/dL (ref 0.70–1.25)
GFR, Est African American: 103 mL/min/{1.73_m2} (ref >=60)
GFR, Est Non African American: 89 mL/min/{1.73_m2} (ref >=60)
Glucose, Bld: 113 mg/dL — ABNORMAL HIGH (ref 65–99)
Potassium: 4 mmol/L (ref 3.5–5.3)
Sodium: 140 mmol/L (ref 135–146)

## 2019-12-31 LAB — POCT INR: INR: 1.8 — AB (ref 2.0–3.0)

## 2019-12-31 NOTE — Assessment & Plan Note (Signed)
Due to check BMP

## 2019-12-31 NOTE — Assessment & Plan Note (Addendum)
He does feel like he is getting more short of breath with activities gradually over time.  Denies any cough or sputum production.  We discussed using his albuterol before activities such as splitting wood etc. and seeing if that helps.  Let him know that albuterol now has a generic available gets.  Gets yearly PFTs with Dr. Charlett Blake in [place].

## 2019-12-31 NOTE — Assessment & Plan Note (Signed)
Will.  No recent flares or exacerbations.

## 2019-12-31 NOTE — Assessment & Plan Note (Signed)
Well controlled. Continue current regimen. Follow up in  6 months.  

## 2019-12-31 NOTE — Progress Notes (Signed)
Pt here for INR check no missed doses or diet changes,bruising,bleeding,CP,SOB  He is taking 2.5 mg 4 days per week and 5mg  the other 3 days per week

## 2019-12-31 NOTE — Patient Instructions (Addendum)
Increase your Coumadin dose by a whole tab one extra day week.  I think you are taking a whole tab 3 days a week currently so go up to 4 days a week.  And then just take a half a tab the other 3 days a week.  Take whole tab 5mg  Monday-Thursday, and half tab 2.5mg  Friday-Sunday

## 2020-01-01 NOTE — Progress Notes (Signed)
All labs are normal. 

## 2020-01-07 ENCOUNTER — Encounter: Payer: Self-pay | Admitting: Sports Medicine

## 2020-01-07 ENCOUNTER — Other Ambulatory Visit: Payer: Self-pay

## 2020-01-07 ENCOUNTER — Ambulatory Visit (INDEPENDENT_AMBULATORY_CARE_PROVIDER_SITE_OTHER): Payer: Medicare HMO | Admitting: Sports Medicine

## 2020-01-07 ENCOUNTER — Ambulatory Visit (INDEPENDENT_AMBULATORY_CARE_PROVIDER_SITE_OTHER): Payer: Medicare HMO

## 2020-01-07 DIAGNOSIS — M1712 Unilateral primary osteoarthritis, left knee: Secondary | ICD-10-CM | POA: Diagnosis not present

## 2020-01-07 DIAGNOSIS — M25461 Effusion, right knee: Secondary | ICD-10-CM | POA: Diagnosis not present

## 2020-01-07 DIAGNOSIS — M25561 Pain in right knee: Secondary | ICD-10-CM

## 2020-01-07 DIAGNOSIS — T8450XA Infection and inflammatory reaction due to unspecified internal joint prosthesis, initial encounter: Secondary | ICD-10-CM | POA: Diagnosis not present

## 2020-01-07 DIAGNOSIS — Z96651 Presence of right artificial knee joint: Secondary | ICD-10-CM | POA: Diagnosis not present

## 2020-01-07 LAB — CBC WITH DIFFERENTIAL/PLATELET
Absolute Monocytes: 491 cells/uL (ref 200–950)
Basophils Absolute: 50 cells/uL (ref 0–200)
Basophils Relative: 0.8 %
Eosinophils Absolute: 189 cells/uL (ref 15–500)
Eosinophils Relative: 3 %
HCT: 41.5 % (ref 38.5–50.0)
Hemoglobin: 14 g/dL (ref 13.2–17.1)
Lymphs Abs: 1966 cells/uL (ref 850–3900)
MCH: 30.9 pg (ref 27.0–33.0)
MCHC: 33.7 g/dL (ref 32.0–36.0)
MCV: 91.6 fL (ref 80.0–100.0)
MPV: 10.7 fL (ref 7.5–12.5)
Monocytes Relative: 7.8 %
Neutro Abs: 3604 cells/uL (ref 1500–7800)
Neutrophils Relative %: 57.2 %
Platelets: 194 10*3/uL (ref 140–400)
RBC: 4.53 10*6/uL (ref 4.20–5.80)
RDW: 11.6 % (ref 11.0–15.0)
Total Lymphocyte: 31.2 %
WBC: 6.3 10*3/uL (ref 3.8–10.8)

## 2020-01-07 LAB — COMPLETE METABOLIC PANEL WITH GFR
AG Ratio: 1.8 (calc) (ref 1.0–2.5)
ALT: 31 U/L (ref 9–46)
AST: 25 U/L (ref 10–35)
Albumin: 4 g/dL (ref 3.6–5.1)
Alkaline phosphatase (APISO): 57 U/L (ref 35–144)
BUN: 18 mg/dL (ref 7–25)
CO2: 30 mmol/L (ref 20–32)
Calcium: 8.7 mg/dL (ref 8.6–10.3)
Chloride: 106 mmol/L (ref 98–110)
Creat: 0.85 mg/dL (ref 0.70–1.25)
GFR, Est African American: 103 mL/min/{1.73_m2} (ref >=60)
GFR, Est Non African American: 89 mL/min/{1.73_m2} (ref >=60)
Globulin: 2.2 g/dL (calc) (ref 1.9–3.7)
Glucose, Bld: 113 mg/dL — ABNORMAL HIGH (ref 65–99)
Potassium: 4.1 mmol/L (ref 3.5–5.3)
Sodium: 141 mmol/L (ref 135–146)
Total Bilirubin: 3 mg/dL — ABNORMAL HIGH (ref 0.2–1.2)
Total Protein: 6.2 g/dL (ref 6.1–8.1)

## 2020-01-07 LAB — SEDIMENTATION RATE: Sed Rate: 2 mm/h (ref 0–20)

## 2020-01-07 NOTE — Assessment & Plan Note (Addendum)
This is a pleasant 69 year old male, approximately 3 years ago he had a total knee arthroplasty with Dr. Berenice Primas. Initially did well, he did complain of some numbness around the incision which I explained to him was normal. Over the past several months he has noted increasing swelling, no fevers, chills, no trauma, no mechanical symptoms. Exam is essentially unremarkable. We are going to get some x-rays to evaluate for loosening, I performed an arthrocentesis which will be sent off for Synovasure testing including a hold for Propionibacterium acnes. We are also going to get CBC, CMP, ESR. I would also like him to touch base again with Dr. Berenice Primas. If x-rays and cultures are negative he will likely need a three-phase bone scan to evaluate for loosening, I suspect this will be done through Dr. Berenice Primas.  Update 01/08/2020 Synovasure testing is negative, alpha defensin testing is negative, microbial ID panel is negative for Propionibacterium acnes, Staphylococcus, Candida, and Enterococcus.  Neutrophil elastase is negative.  WBC count is 704/mcL which is normal.  In addition, serum testing is unremarkable with a normal ESR.

## 2020-01-07 NOTE — Progress Notes (Addendum)
    Procedures performed today:    Procedure: Real-time Ultrasound Guided aspiration of right knee Device: Samsung [id number]  Verbal informed consent obtained.  Time-out conducted.  Noted no overlying erythema, induration, or other signs of local infection.  Skin prepped in a sterile fashion with chlorhexidine.  Local anesthesia: Topical Ethyl chloride.  With sterile technique and under real time ultrasound guidance:  Using strict sterile technique I advanced an 18-gauge needle into the joint and aspirated 9 mL of slightly cloudy, orange fluid. completed without difficulty  Pain immediately resolved suggesting accurate placement of the medication.  Advised to call if fevers/chills, erythema, induration, drainage, or persistent bleeding.  Images permanently stored and available for review in the ultrasound unit.  Impression: Technically successful ultrasound guided injection.  Independent interpretation of tests performed by another provider:   None.  Impression and Recommendations:    History of arthroplasty of right knee This is a pleasant 68-year-old male, approximately 3 years ago he had a total knee arthroplasty with Dr. Graves. Initially did well, he did complain of some numbness around the incision which I explained to him was normal. Over the past several months he has noted increasing swelling, no fevers, chills, no trauma, no mechanical symptoms. Exam is essentially unremarkable. We are going to get some x-rays to evaluate for loosening, I performed an arthrocentesis which will be sent off for Synovasure testing including a hold for Propionibacterium acnes. We are also going to get CBC, CMP, ESR. I would also like him to touch base again with Dr. Graves. If x-rays and cultures are negative he will likely need a three-phase bone scan to evaluate for loosening, I suspect this will be done through Dr. Graves.  Update 01/08/2020 Synovasure testing is negative, alpha defensin testing  is negative, microbial ID panel is negative for Propionibacterium acnes, Staphylococcus, Candida, and Enterococcus.  Neutrophil elastase is negative.  WBC count is 704/mcL which is normal.  In addition, serum testing is unremarkable with a normal ESR.    ___________________________________________ Thomas J. Thekkekandam, M.D., ABFM., CAQSM. Primary Care and Sports Medicine [facility]  Adjunct Instructor of Family Medicine  [employer] 

## 2020-01-29 DIAGNOSIS — Z96651 Presence of right artificial knee joint: Secondary | ICD-10-CM | POA: Diagnosis not present

## 2020-01-29 DIAGNOSIS — M25561 Pain in right knee: Secondary | ICD-10-CM | POA: Diagnosis not present

## 2020-02-04 ENCOUNTER — Other Ambulatory Visit: Payer: Self-pay | Admitting: Family Medicine

## 2020-02-04 DIAGNOSIS — I482 Chronic atrial fibrillation, unspecified: Secondary | ICD-10-CM

## 2020-02-04 MED ORDER — WARFARIN SODIUM 5 MG PO TABS: 2.5000 mg | ORAL_TABLET | ORAL | 3 refills | Status: DC

## 2020-02-04 NOTE — Telephone Encounter (Signed)
Patient calling in wanting a refill on the: warfarin (COUMADIN) 5 MG tablet [id number] . No further questions at this time.

## 2020-02-04 NOTE — Telephone Encounter (Signed)
Medication refilled. KG LPN

## 2020-02-08 ENCOUNTER — Other Ambulatory Visit: Payer: Self-pay | Admitting: *Deleted

## 2020-02-08 MED ORDER — FUROSEMIDE 40 MG PO TABS: ORAL_TABLET | ORAL | 0 refills | Status: DC

## 2020-02-11 ENCOUNTER — Ambulatory Visit (INDEPENDENT_AMBULATORY_CARE_PROVIDER_SITE_OTHER): Payer: Medicare HMO | Admitting: Family Medicine

## 2020-02-11 ENCOUNTER — Other Ambulatory Visit: Payer: Self-pay

## 2020-02-11 DIAGNOSIS — I4891 Unspecified atrial fibrillation: Secondary | ICD-10-CM

## 2020-02-11 LAB — POCT INR: INR: 2.7 (ref 2.0–3.0)

## 2020-02-11 MED ORDER — TRAMADOL HCL 50 MG PO TABS: 50.0000 mg | ORAL_TABLET | Freq: Three times a day (TID) | ORAL | 0 refills | Status: DC | PRN

## 2020-02-11 NOTE — Progress Notes (Signed)
Patient reports taking Coumadin as directed (1 tablet 3 days a week, half a tablet 4 days a week). No missed doses of meds, extra doses, bleeding gums, nose bleeds, antibiotic use, hospitalization, blood in urine, blood in stool, dental procedures, bruising, abnormal bleeding or med changes.  INR today 2.7. No dosage changes. He will follow up for INR check as directed.   Requesting refill of Tramadol.

## 2020-02-11 NOTE — Progress Notes (Signed)
See flowsheet.  Same dose.  Recheck INR in 4 weeks.  Did go ahead and refill tramadol today.

## 2020-02-25 ENCOUNTER — Ambulatory Visit: Payer: Medicare HMO

## 2020-03-08 ENCOUNTER — Ambulatory Visit (INDEPENDENT_AMBULATORY_CARE_PROVIDER_SITE_OTHER): Payer: Medicare HMO | Admitting: Family Medicine

## 2020-03-08 ENCOUNTER — Other Ambulatory Visit: Payer: Self-pay

## 2020-03-08 ENCOUNTER — Encounter: Payer: Self-pay | Admitting: Family Medicine

## 2020-03-08 VITALS — BP 159/68 | HR 89 | Ht 74.0 in | Wt 290.0 lb

## 2020-03-08 DIAGNOSIS — I4891 Unspecified atrial fibrillation: Secondary | ICD-10-CM

## 2020-03-08 DIAGNOSIS — I10 Essential (primary) hypertension: Secondary | ICD-10-CM

## 2020-03-08 DIAGNOSIS — L255 Unspecified contact dermatitis due to plants, except food: Secondary | ICD-10-CM | POA: Diagnosis not present

## 2020-03-08 LAB — POCT INR: INR: 2.3 (ref 2.0–3.0)

## 2020-03-08 MED ORDER — SULFAMETHOXAZOLE-TRIMETHOPRIM 800-160 MG PO TABS: 1.0000 | ORAL_TABLET | Freq: Two times a day (BID) | ORAL | 0 refills | Status: DC

## 2020-03-08 MED ORDER — PREDNISONE 20 MG PO TABS: 40.0000 mg | ORAL_TABLET | Freq: Every day | ORAL | 0 refills | Status: DC

## 2020-03-08 NOTE — Assessment & Plan Note (Signed)
Pressure elevated today he says he did take his medication it was a little bit high last time but not quite this high so encouraged him to keep an eye on it over the next couple of weeks.  In fact his weight is actually down so would suspect that his pressure should actually be better.  But if it is not coming down at home to please give [place] call back in the next week or 2.

## 2020-03-08 NOTE — Progress Notes (Signed)
Pt reports that he woke up Sunday morning and the L side of his neck was hurting and itchy. He wasn't sure if he was bitten by something in the middle of the night. He didn't use anything on the area.   He stated that on Saturday he had a tree that fell in his yard and he had to cut it up. It had some poison oak/ivy on the tree of which he is allergic to.   The area is red and swollen.    Pt is also due for his INR. He hasn't had any missed doses,diet changes,bruising,bleeding,CP,SOB.  He takes 2.5 mg 4 days a week and 5 mg all other days.

## 2020-03-08 NOTE — Assessment & Plan Note (Signed)
INR at goal. Recheck in 4 weeks.

## 2020-03-08 NOTE — Progress Notes (Signed)
Acute Office Visit  Subjective:    Patient ID: Jerome Hicks, male    DOB: Dec 21, 1950, 69 y.o.   MRN: [id number]  Chief Complaint  Patient presents with  . Insect Bite    HPI Patient is in today for Possible "bug bite".  He says he was pulling down a tree on Friday and knows he got exposed to some poison oak.  He says the next day he started to notice some itching and irritation on the side of his left neck.  It is continued to get worse it is itchy.  No fevers or chills but does feel hot to touch.  He is not sure if it is just poison oak but he did not get it anywhere else on his skin he did have gloves on the also had short sleeves on.  He is not sure if it could be a bug bite as well.  Past Medical History:  Diagnosis Date  . Arthritis    oa  . 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 Ascending aortic aneurysm ([place])    seen on 01-31-18 ct chest ; patient denies awareness  . Atrial fibrillation ([place])   . Cataracts, bilateral   . ED (erectile dysfunction)    has been prescribed Levitra and Viagrain in the past  . Hypertension   . Internal hemorrhoids   . Kidney stones   . Leg edema    recurrent cellulitis  . Scrotal abscess 3-03  . Shoulder arthritis   . Sigmoid diverticulosis     Past Surgical History:  Procedure Laterality Date  .  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 CHOLECYSTECTOMY  1971  . kidney stones  1990's   Ureteroscopic stone extraction  . TONSILLECTOMY    . 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 Heart attack Mother 13  . Heart attack Father 67  . Heart attack Sister 13  . Heart attack Brother 56  . Multiple sclerosis Daughter     Social History   Socioeconomic History  . Marital status: Widowed    Spouse name: Not on file  .  Number of children: 2  . Years of education: 40  . Highest education level: Associate degree: occupational, Hotel manager, or vocational program  Occupational History  . Occupation: On disability     Comment: retired  Tobacco Use  . Smoking status: Never Smoker  . Smokeless tobacco: Current User    Types: Chew  Substance and Sexual Activity  . Alcohol use: No  . Drug use: No  . Sexual activity: Not Currently  Other Topics Concern  . Not on file  Social History Narrative   Staying active   Splits wood   Social Determinants of Health   Financial Resource Strain: Low Risk   . Difficulty of Paying Living Expenses: Not hard at all  Food Insecurity: No Food Insecurity  . Worried About Charity fundraiser in the Last Year: Never true  . Ran Out of Food in the Last Year: Never true  Transportation Needs: No Transportation Needs  . Lack of Transportation (Medical): No  . Lack of Transportation (Non-Medical): No  Physical Activity: Inactive  . Days of Exercise per Week: 0 days  . Minutes of Exercise per Session: 0  min  Stress: No Stress Concern Present  . Feeling of Stress : Not at all  Social Connections: Moderately Isolated  . Frequency of Communication with Friends and Family: More than three times a week  . Frequency of Social Gatherings with Friends and Family: Once a week  . Attends Religious Services: Never  . Active Member of Clubs or Organizations: No  . Attends Archivist Meetings: Never  . Marital Status: Widowed  Intimate Partner Violence: Not At Risk  . Fear of Current or Ex-Partner: No  . Emotionally Abused: No  . Physically Abused: No  . Sexually Abused: No    Outpatient Medications Prior to Visit  Medication Sig Dispense Refill  . albuterol (PROVENTIL HFA;VENTOLIN HFA) 108 (90 Base) MCG/ACT inhaler Inhale 1-2 puffs into the lungs every 6 (six) hours as needed for wheezing or shortness of breath. 1 Inhaler 2  . Carboxymethylcellul-Glycerin (LUBRICATING  EYE DROPS OP) Place 1 drop into both eyes daily as needed (dry eyes).    . carvedilol (COREG) 3.125 MG tablet TAKE 1 TABLET (3.125 MG TOTAL) BY MOUTH 2 (TWO) TIMES DAILY WITH A MEAL. 180 tablet 3  . clotrimazole-betamethasone (LOTRISONE) cream Apply 1 application topically daily as needed (rash).     Marland Kitchen doxazosin (CARDURA) 4 MG tablet TAKE 1 TABLET EVERY DAY 90 tablet 1  . furosemide (LASIX) 40 MG tablet Take 1- 2 tablets daily as needed for edema. 180 tablet 0  . lisinopril (ZESTRIL) 10 MG tablet TAKE 1 TABLET EVERY DAY 90 tablet 1  . meclizine (ANTIVERT) 25 MG tablet TAKE 1 TABLET THREE TIMES DAILY AS NEEDED  FOR  DIZZINESS  OR  NAUSEA  AS DIRECTED 90 tablet 1  . Multiple Vitamin (MULTIVITAMIN WITH MINERALS) TABS tablet Take 1 tablet by mouth daily.    . potassium chloride (KLOR-CON) 10 MEQ tablet TAKE 1 TABLET EVERY DAY 90 tablet 1  . traMADol (ULTRAM) 50 MG tablet Take 1 tablet (50 mg total) by mouth every 8 (eight) hours as needed. 90 tablet 0  . vitamin B-12 (CYANOCOBALAMIN) 1000 MCG tablet Take 1,000 mcg by mouth daily.    Marland Kitchen warfarin (COUMADIN) 5 MG tablet Take 0.5-1 tablets (2.5-5 mg total) by mouth See admin instructions. Take 5 mg daily on Mon, Thurs, and Sat  Take 2.5 mg daily on Tues, Wed, Fri, and Sun 60 tablet 3  . AMBULATORY NON FORMULARY MEDICATION Medication Name: Tdap x 1 IM 1 each 0   No facility-administered medications prior to visit.    Allergies  Allergen Reactions  . Penicillins     Childhood allergy Has patient had a PCN reaction causing immediate rash, facial/tongue/throat swelling, SOB or lightheadedness with hypotension: Unknown Has patient had a PCN reaction causing severe rash involving mucus membranes or skin necrosis: Unknown Has patient had a PCN reaction that required hospitalization: Unknown Has patient had a PCN reaction occurring within the last 10 years: No If all of the above answers are "NO", then may proceed with Cephalosporin use.     Review of  Systems     Objective:    Physical Exam Vitals reviewed.  Constitutional:      Appearance: He is well-developed.  HENT:     Head: Normocephalic and atraumatic.  Eyes:     Conjunctiva/sclera: Conjunctivae normal.  Cardiovascular:     Rate and Rhythm: Normal rate.  Pulmonary:     Effort: Pulmonary effort is normal.  Skin:    General: Skin is dry.  Coloration: Skin is not pale.     Comments: On the left side of his neck he has a very large confluent erythematous area measuring approximately 12 x 13 cm.  Close to the center along the crease of his neck near the area looks more red and raw with emesis a little bit of brown crusting.  But no nodularity or induration or papules or pustules.  Neurological:     Mental Status: He is alert and oriented to person, place, and time.  Psychiatric:        Behavior: Behavior normal.     BP (!) 159/68   Pulse 89   Ht 6\' 2"  (1.88 m)   Wt 290 lb (131.5 kg)   SpO2 100%   BMI 37.23 kg/m  Wt Readings from Last 3 Encounters:  03/08/20 290 lb (131.5 kg)  02/11/20 294 lb (133.4 kg)  12/31/19 292 lb (132.5 kg)    Health Maintenance Due  Topic Date Due  . TETANUS/TDAP  06/16/2019    There are no preventive care reminders to display for this patient.   Lab Results  Component Value Date   TSH 3.18 04/30/2017   Lab Results  Component Value Date   WBC 6.3 01/07/2020   HGB 14.0 01/07/2020   HCT 41.5 01/07/2020   MCV 91.6 01/07/2020   PLT 194 01/07/2020   Lab Results  Component Value Date   NA 141 01/07/2020   K 4.1 01/07/2020   CO2 30 01/07/2020   GLUCOSE 113 (H) 01/07/2020   BUN 18 01/07/2020   CREATININE 0.85 01/07/2020   BILITOT 3.0 (H) 01/07/2020   ALKPHOS 68 04/30/2017   AST 25 01/07/2020   ALT 31 01/07/2020   PROT 6.2 01/07/2020   ALBUMIN 3.5 (L) 04/30/2017   CALCIUM 8.7 01/07/2020   ANIONGAP 6 08/02/2018   Lab Results  Component Value Date   CHOL 133 04/06/2019   Lab Results  Component Value Date   HDL 38  (L) 04/06/2019   Lab Results  Component Value Date   LDLCALC 80 04/06/2019   Lab Results  Component Value Date   TRIG 70 04/06/2019   Lab Results  Component Value Date   CHOLHDL 3.5 04/06/2019   Lab Results  Component Value Date   HGBA1C 4.9 10/06/2018       Assessment & Plan:   Problem List Items Addressed This Visit      Cardiovascular and Mediastinum   ESSENTIAL HYPERTENSION, BENIGN    Pressure elevated today he says he did take his medication it was a little bit high last time but not quite this high so encouraged him to keep an eye on it over the next couple of weeks.  In fact his weight is actually down so would suspect that his pressure should actually be better.  But if it is not coming down at home to please give [place] call back in the next week or 2.      Atrial fibrillation ([place]) - Primary    INR at goal. Recheck in 4 weeks.        Relevant Orders   POCT INR (Completed)    Other Visit Diagnoses    Rhus dermatitis         Rhus Dermatitis - will treat with Bactrim DS and and prednisone.  At the center it most looks like it is getting a little bit of an impetigo infection going on so we will also treat with Bactrim especially since it so confluent  and so diffuse which is a little bit unusual.  If it is not improving in the next 48 hours encouraged him to give [place] a call back.  If any point he develops a fever then give [place] call back as well.       Meds ordered this encounter  Medications  . sulfamethoxazole-trimethoprim (BACTRIM DS) 800-160 MG tablet    Sig: Take 1 tablet by mouth 2 (two) times daily.    Dispense:  20 tablet    Refill:  0  . predniSONE (DELTASONE) 20 MG tablet    Sig: Take 2 tablets (40 mg total) by mouth daily with breakfast.    Dispense:  10 tablet    Refill:  0     Beatrice Lecher, MD

## 2020-03-09 ENCOUNTER — Ambulatory Visit: Payer: Medicare HMO

## 2020-03-10 ENCOUNTER — Ambulatory Visit: Payer: Medicare HMO

## 2020-04-05 ENCOUNTER — Ambulatory Visit: Payer: Medicare HMO

## 2020-04-06 ENCOUNTER — Ambulatory Visit (INDEPENDENT_AMBULATORY_CARE_PROVIDER_SITE_OTHER): Payer: Medicare HMO | Admitting: Family Medicine

## 2020-04-06 ENCOUNTER — Other Ambulatory Visit: Payer: Self-pay

## 2020-04-06 DIAGNOSIS — I4891 Unspecified atrial fibrillation: Secondary | ICD-10-CM

## 2020-04-06 LAB — POCT INR: INR: 2.5 (ref 2.0–3.0)

## 2020-04-06 NOTE — Progress Notes (Signed)
Patient reports taking Coumadin as follows:  2.5 mg 4 days per week and 5mg  the other 3 days per week.     Jerome Hicks denies any missed doses of medication, extra doses, bleeding gums, nose bleeds, antibiotic use, hospitalization, blood in urine, blood in stool, dental procedures, any bruising, abnormal bleeding, or medication changes.  INR was checked today with a reading of 2.5. He was instructed to stay on current dose and return in 4 weeks.

## 2020-04-06 NOTE — Progress Notes (Signed)
Agree with documentation as above.   Sava Proby, MD  

## 2020-04-08 ENCOUNTER — Other Ambulatory Visit: Payer: Self-pay | Admitting: *Deleted

## 2020-04-08 DIAGNOSIS — I482 Chronic atrial fibrillation, unspecified: Secondary | ICD-10-CM

## 2020-04-08 MED ORDER — WARFARIN SODIUM 5 MG PO TABS: 2.5000 mg | ORAL_TABLET | ORAL | 1 refills | Status: DC

## 2020-04-12 ENCOUNTER — Other Ambulatory Visit: Payer: Self-pay | Admitting: *Deleted

## 2020-04-15 ENCOUNTER — Other Ambulatory Visit: Payer: Self-pay

## 2020-04-15 NOTE — Telephone Encounter (Signed)
[employer] called and left a message stating Jerome Hicks was wanting a refill on prednisone and tramadol. I looked in his chart and the last tramadol was sent to the local pharmacy. I am not sure why he is requesting the prednisone. I called and left a message for Jerome Hicks to call back.

## 2020-04-18 MED ORDER — TRAMADOL HCL 50 MG PO TABS: 50.0000 mg | ORAL_TABLET | Freq: Three times a day (TID) | ORAL | 0 refills | Status: DC | PRN

## 2020-04-18 NOTE — Telephone Encounter (Signed)
[facility] called back and states he only needs a refill on the Tramadol. He states he would like it to go [facility].

## 2020-05-03 ENCOUNTER — Other Ambulatory Visit: Payer: Self-pay | Admitting: *Deleted

## 2020-05-03 ENCOUNTER — Telehealth: Payer: Self-pay | Admitting: Family Medicine

## 2020-05-03 MED ORDER — CARVEDILOL 3.125 MG PO TABS: 3.1250 mg | ORAL_TABLET | Freq: Two times a day (BID) | ORAL | 3 refills | Status: DC

## 2020-05-03 NOTE — Telephone Encounter (Signed)
Pt called and states that he needs his Carvedilol 3.125mg  tabs called into [facility] and that he only has about a week left of this medicine.

## 2020-05-03 NOTE — Telephone Encounter (Signed)
[facility], they received the prescription and it will go out in the next 24 hours.  Called and spoke with Savone to let him know.

## 2020-05-03 NOTE — Telephone Encounter (Signed)
He needs to call [facility] directly.  It was sent 2 weeks ago

## 2020-05-04 ENCOUNTER — Ambulatory Visit (INDEPENDENT_AMBULATORY_CARE_PROVIDER_SITE_OTHER): Payer: Medicare HMO | Admitting: Family Medicine

## 2020-05-04 DIAGNOSIS — I4891 Unspecified atrial fibrillation: Secondary | ICD-10-CM | POA: Diagnosis not present

## 2020-05-04 DIAGNOSIS — I482 Chronic atrial fibrillation, unspecified: Secondary | ICD-10-CM

## 2020-05-04 LAB — POCT INR: INR: 3.2 — AB (ref 2.0–3.0)

## 2020-05-04 MED ORDER — WARFARIN SODIUM 5 MG PO TABS: 2.5000 mg | ORAL_TABLET | ORAL | 1 refills | Status: DC

## 2020-05-04 NOTE — Progress Notes (Signed)
Agree with documentation as above.  See anticoagulation flowsheet for adjustments.  Beatrice Lecher, MD

## 2020-05-04 NOTE — Patient Instructions (Addendum)
Results discussed with provider, patient advised to change dose to 5 mg on Monday and Thursday, 2.5 mg all other days.Patient to return in 2 weeks for recheck. Medication list has been updated.

## 2020-05-04 NOTE — Progress Notes (Signed)
Patient's blood pressure were elevated today at 150/84 and recheck at 147/85.   Patient reports checking blood pressures at home and it running in 130s/70s. I asked patient to record blood pressures for next 2 weeks and bring to office at his recheck to see if any adjustments need to be made. FYI to PCP.   HM: Patient declined Tdap, we cannot do this in office due to Medicare not covering in office and it will cover   Patient did get both COVID vaccines, these have been documented.

## 2020-05-06 DIAGNOSIS — H524 Presbyopia: Secondary | ICD-10-CM | POA: Diagnosis not present

## 2020-05-06 DIAGNOSIS — Z0100 Encounter for examination of eyes and vision without abnormal findings: Secondary | ICD-10-CM | POA: Diagnosis not present

## 2020-05-19 ENCOUNTER — Ambulatory Visit (INDEPENDENT_AMBULATORY_CARE_PROVIDER_SITE_OTHER): Payer: Medicare HMO | Admitting: Family Medicine

## 2020-05-19 ENCOUNTER — Other Ambulatory Visit: Payer: Self-pay

## 2020-05-19 VITALS — BP 150/94 | HR 80

## 2020-05-19 DIAGNOSIS — I10 Essential (primary) hypertension: Secondary | ICD-10-CM

## 2020-05-19 DIAGNOSIS — I482 Chronic atrial fibrillation, unspecified: Secondary | ICD-10-CM

## 2020-05-19 LAB — POCT INR: INR: 2.1 (ref 2.0–3.0)

## 2020-05-19 MED ORDER — POTASSIUM CHLORIDE CRYS ER 10 MEQ PO TBCR: 10.0000 meq | EXTENDED_RELEASE_TABLET | Freq: Every day | ORAL | 1 refills | Status: DC

## 2020-05-19 MED ORDER — LISINOPRIL 20 MG PO TABS: 20.0000 mg | ORAL_TABLET | Freq: Every day | ORAL | 1 refills | Status: DC

## 2020-05-19 NOTE — Progress Notes (Signed)
Patient made aware to increase to Lisinopril 20 mg. Sent to [facility] per patient request. He was instructed to take two tablets of 10 mg until he receives new dosage. Will call with any problems.

## 2020-05-19 NOTE — Progress Notes (Signed)
HTN - BPs not well controlled.  Will increase linsinopril to 20mg . Keep f/u already scheduled.     INR at goal. Same dose, repeat in 3 weeks.

## 2020-05-19 NOTE — Addendum Note (Signed)
Addended byAnnamaria Helling on: 05/19/2020 11:29 AM   Modules accepted: Orders

## 2020-05-19 NOTE — Progress Notes (Signed)
Patient reports taking Coumadin as follows:  5 mg Monday and Thursday 2.5 mg every other day   Jerome Hicks denies any missed doses of medication, extra doses, bleeding gums, nose bleeds, antibiotic use, hospitalization, blood in urine, blood in stool, dental procedures, any bruising, abnormal bleeding, or medication changes.  INR was checked today with a reading of 2.1. He will follow up for recheck in 3 weeks.   Patient has had elevated blood pressures. Blood pressure reading today is 150/94. He brings in home readings today which are as follows:  05/08/2020: 144/98 (before meds) 05/09/2020: 147/82 05/10/2020: 153/85 05/11/2020: 165/91 05/12/2020: 137/90 05/13/2020: 153/86, 134/81 05/15/2020: 126/91 05/16/2020: 142/86 05/17/2020: 145/91 05/18/2020: 138/90 05/19/2020: 138/88  Potassium refill sent to the pharmacy.

## 2020-05-30 ENCOUNTER — Encounter: Payer: Self-pay | Admitting: Family Medicine

## 2020-05-30 DIAGNOSIS — H04123 Dry eye syndrome of bilateral lacrimal glands: Secondary | ICD-10-CM | POA: Diagnosis not present

## 2020-05-30 DIAGNOSIS — H02831 Dermatochalasis of right upper eyelid: Secondary | ICD-10-CM | POA: Diagnosis not present

## 2020-05-30 DIAGNOSIS — H02834 Dermatochalasis of left upper eyelid: Secondary | ICD-10-CM | POA: Diagnosis not present

## 2020-05-30 DIAGNOSIS — Z961 Presence of intraocular lens: Secondary | ICD-10-CM | POA: Diagnosis not present

## 2020-05-30 DIAGNOSIS — H02403 Unspecified ptosis of bilateral eyelids: Secondary | ICD-10-CM | POA: Diagnosis not present

## 2020-06-09 ENCOUNTER — Other Ambulatory Visit: Payer: Self-pay

## 2020-06-09 ENCOUNTER — Ambulatory Visit (INDEPENDENT_AMBULATORY_CARE_PROVIDER_SITE_OTHER): Payer: Medicare HMO | Admitting: Family Medicine

## 2020-06-09 VITALS — BP 133/72 | HR 78

## 2020-06-09 DIAGNOSIS — I482 Chronic atrial fibrillation, unspecified: Secondary | ICD-10-CM

## 2020-06-09 LAB — POCT INR: INR: 1.4 — AB (ref 2.0–3.0)

## 2020-06-09 NOTE — Patient Instructions (Signed)
Change coumadin as follows: 5 mg on Monday, Wednesday, Friday and continue 2.5 mg all other days. Return in 2 weeks.

## 2020-06-09 NOTE — Progress Notes (Signed)
Patient reports taking Coumadin as follows:  5 mg on Monday and Thursdays, 2.5 mg all other days   Jerome Hicks denies any missed doses of medication, extra doses, bleeding gums, nose bleeds, antibiotic use, hospitalization, blood in urine, blood in stool, dental procedures, any bruising, abnormal bleeding, or medication changes.  INR was checked today with a reading of 1.4.  Blood pressure dosage was changed last visit to Lisinopril 20 mg.   His first blood pressure reading today is: 152/76. After sitting, his second reading is: 133/72.   I spoke with Dr. Madilyn Fireman who advised patient to change dosage to 5 mg on Monday, Wednesday, Friday and continue 2.5 mg all other days. Return in 2 weeks.

## 2020-06-15 ENCOUNTER — Other Ambulatory Visit: Payer: Self-pay

## 2020-06-15 ENCOUNTER — Telehealth: Payer: Self-pay | Admitting: Family Medicine

## 2020-06-15 DIAGNOSIS — I10 Essential (primary) hypertension: Secondary | ICD-10-CM

## 2020-06-15 MED ORDER — DOXAZOSIN MESYLATE 4 MG PO TABS: 4.0000 mg | ORAL_TABLET | Freq: Every day | ORAL | 1 refills | Status: DC

## 2020-06-15 NOTE — Telephone Encounter (Signed)
Let patient know information below.

## 2020-06-15 NOTE — Telephone Encounter (Signed)
Patient needs a refill on doxazosin (CARDURA) 4 MG tablet [[id number]]. Would like it sent to [place] [facility], [address]  [place], [place] [address].

## 2020-06-15 NOTE — Telephone Encounter (Signed)
Task completed. Rx sent to [place] [facility] as requested. Please contact the pt and update. Thanks.

## 2020-06-23 ENCOUNTER — Encounter: Payer: Self-pay | Admitting: Family Medicine

## 2020-06-23 ENCOUNTER — Other Ambulatory Visit: Payer: Self-pay

## 2020-06-23 ENCOUNTER — Ambulatory Visit (INDEPENDENT_AMBULATORY_CARE_PROVIDER_SITE_OTHER): Payer: Medicare HMO | Admitting: Family Medicine

## 2020-06-23 VITALS — BP 128/72 | HR 77 | Ht 74.0 in | Wt 285.0 lb

## 2020-06-23 DIAGNOSIS — I10 Essential (primary) hypertension: Secondary | ICD-10-CM

## 2020-06-23 DIAGNOSIS — Z0000 Encounter for general adult medical examination without abnormal findings: Secondary | ICD-10-CM | POA: Diagnosis not present

## 2020-06-23 DIAGNOSIS — Z23 Encounter for immunization: Secondary | ICD-10-CM | POA: Diagnosis not present

## 2020-06-23 DIAGNOSIS — I4891 Unspecified atrial fibrillation: Secondary | ICD-10-CM

## 2020-06-23 DIAGNOSIS — R0602 Shortness of breath: Secondary | ICD-10-CM

## 2020-06-23 DIAGNOSIS — Z125 Encounter for screening for malignant neoplasm of prostate: Secondary | ICD-10-CM | POA: Diagnosis not present

## 2020-06-23 DIAGNOSIS — S81801A Unspecified open wound, right lower leg, initial encounter: Secondary | ICD-10-CM | POA: Diagnosis not present

## 2020-06-23 LAB — LIPID PANEL
Cholesterol: 135 mg/dL (ref <200)
HDL: 45 mg/dL (ref >=40)
LDL Cholesterol (Calc): 75 mg/dL (calc)
Non-HDL Cholesterol (Calc): 90 mg/dL (calc) (ref <130)
Total CHOL/HDL Ratio: 3 (calc) (ref <5.0)
Triglycerides: 72 mg/dL (ref <150)

## 2020-06-23 LAB — COMPLETE METABOLIC PANEL WITH GFR
AG Ratio: 1.9 (calc) (ref 1.0–2.5)
ALT: 24 U/L (ref 9–46)
AST: 23 U/L (ref 10–35)
Albumin: 4.2 g/dL (ref 3.6–5.1)
Alkaline phosphatase (APISO): 67 U/L (ref 35–144)
BUN: 16 mg/dL (ref 7–25)
CO2: 31 mmol/L (ref 20–32)
Calcium: 9.5 mg/dL (ref 8.6–10.3)
Chloride: 106 mmol/L (ref 98–110)
Creat: 0.92 mg/dL (ref 0.70–1.18)
GFR, Est African American: 97 mL/min/{1.73_m2} (ref >=60)
GFR, Est Non African American: 84 mL/min/{1.73_m2} (ref >=60)
Globulin: 2.2 g/dL (calc) (ref 1.9–3.7)
Glucose, Bld: 114 mg/dL — ABNORMAL HIGH (ref 65–99)
Potassium: 4.8 mmol/L (ref 3.5–5.3)
Sodium: 140 mmol/L (ref 135–146)
Total Bilirubin: 3.3 mg/dL — ABNORMAL HIGH (ref 0.2–1.2)
Total Protein: 6.4 g/dL (ref 6.1–8.1)

## 2020-06-23 LAB — CBC
HCT: 42.7 % (ref 38.5–50.0)
Hemoglobin: 14.5 g/dL (ref 13.2–17.1)
MCH: 31.7 pg (ref 27.0–33.0)
MCHC: 34 g/dL (ref 32.0–36.0)
MCV: 93.2 fL (ref 80.0–100.0)
MPV: 10.7 fL (ref 7.5–12.5)
Platelets: 211 10*3/uL (ref 140–400)
RBC: 4.58 10*6/uL (ref 4.20–5.80)
RDW: 11.4 % (ref 11.0–15.0)
WBC: 6.4 10*3/uL (ref 3.8–10.8)

## 2020-06-23 LAB — PSA: PSA: 2.7 ng/mL (ref <=4.0)

## 2020-06-23 LAB — POCT INR: INR: 2.5 (ref 2.0–3.0)

## 2020-06-23 MED ORDER — TETANUS-DIPHTH-ACELL PERTUSSIS 5-2.5-18.5 LF-MCG/0.5 IM SUSP: 0.5000 mL | Freq: Once | INTRAMUSCULAR | 0 refills | Status: AC

## 2020-06-23 NOTE — Progress Notes (Signed)
INR check no missed doses, diet changes,bruising,bleeding,CP,SOB.  He has been taking 5 mg every Mon, Thu; 2.5 mg all other days  Next INR 3 weeks.

## 2020-06-23 NOTE — Progress Notes (Signed)
CPE  Established Patient Office Visit  Subjective:  Patient ID: Jerome Hicks, male    DOB: [birth year]  Age: 69 y.o. MRN: [id number]  CC:  Chief Complaint  Patient presents with   Annual Exam    R lower leg #1- 3.58mm #2- 1.32mm   Atrial Fibrillation    HPI Jerome Hicks presents for CPE.  His pneumonia vaccine flu shot and shingles vaccine are all up-to-date.  He is also had his Covid vaccination.  He is due for a Tdap.  Last one was in July 2010.  Last colonoscopy was August 2020.  Last set of labs was in February 2021 including a kidney function.  He is very independent and performs all his ADLs.  He denies any issues with incontinence no difficulty affording his medications or difficulty with transportation.  Colonoscopy is up-to-date.  PSA is up-to-date. Due for iNR Check today as well.   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)    get yearly PFT's   Ascending aortic aneurysm ([place])    seen on 01-31-18 ct chest ; patient denies awareness   Atrial fibrillation ([place])    Cataracts, bilateral    ED (erectile dysfunction)    has been prescribed Levitra and Viagrain in the past   Hypertension    Internal hemorrhoids    Kidney stones    Leg edema    recurrent cellulitis   Scrotal abscess 3-03   Shoulder arthritis    Sigmoid diverticulosis     Past Surgical History:  Procedure Laterality Date    MRSA infection in left groin requiring surgical debridement  12-08   CHOLECYSTECTOMY  1971   kidney stones  1990's   Ureteroscopic stone extraction   TONSILLECTOMY     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   Procedure: RIGHT TOTAL KNEE ARTHROPLASTY;  Surgeon: Dorna Leitz, MD;  Location: [facility];  Service: Orthopedics;  Laterality: Right;    Family History  Problem Relation Age of Onset   Heart attack Mother 56   Heart attack Father 59   Heart attack Sister 63   Heart attack Brother 49   Multiple sclerosis  Daughter     Social History   Socioeconomic History   Marital status: Widowed    Spouse name: Not on file   Number of children: 2   Years of education: 14   Highest education level: Associate degree: occupational, Hotel manager, or vocational program  Occupational History   Occupation: On disability     Comment: retired  Tobacco Use   Smoking status: Never Smoker   Smokeless tobacco: Current User    Types: Nurse, children's Use: Never used  Substance and Sexual Activity   Alcohol use: No   Drug use: No   Sexual activity: Not Currently  Other Topics Concern   Not on file  Social History Narrative   Staying active   Splits wood   Social Determinants of Health   Financial Resource Strain: Low Risk    Difficulty of Paying Living Expenses: Not hard at all  Food Insecurity: No Food Insecurity   Worried About Charity fundraiser in the Last Year: Never true   [place] in the Last Year: Never true  Transportation Needs: No Transportation Needs   Lack of Transportation (Medical): No   Lack of Transportation (Non-Medical): No  Physical Activity: Inactive   Days of Exercise per Week: 0  days   Minutes of Exercise per Session: 0 min  Stress: No Stress Concern Present   Feeling of Stress : Not at all  Social Connections: Socially Isolated   Frequency of Communication with Friends and Family: More than three times a week   Frequency of Social Gatherings with Friends and Family: Once a week   Attends Religious Services: Never   Marine scientist or Organizations: No   Attends Archivist Meetings: Never   Marital Status: Widowed  Human resources officer Violence: Not At Risk   Fear of Current or Ex-Partner: No   Emotionally Abused: No   Physically Abused: No   Sexually Abused: No    Outpatient Medications Prior to Visit  Medication Sig Dispense Refill   albuterol (PROVENTIL HFA;VENTOLIN HFA) 108 (90 Base) MCG/ACT inhaler  Inhale 1-2 puffs into the lungs every 6 (six) hours as needed for wheezing or shortness of breath. 1 Inhaler 2   Carboxymethylcellul-Glycerin (LUBRICATING EYE DROPS OP) Place 1 drop into both eyes daily as needed (dry eyes).     carvedilol (COREG) 3.125 MG tablet Take 1 tablet (3.125 mg total) by mouth 2 (two) times daily with a meal. 180 tablet 3   clotrimazole-betamethasone (LOTRISONE) cream Apply 1 application topically daily as needed (rash).      doxazosin (CARDURA) 4 MG tablet Take 1 tablet (4 mg total) by mouth daily. 90 tablet 1   furosemide (LASIX) 40 MG tablet Take 1- 2 tablets daily as needed for edema. 180 tablet 0   lisinopril (ZESTRIL) 20 MG tablet Take 1 tablet (20 mg total) by mouth daily. 90 tablet 1   meclizine (ANTIVERT) 25 MG tablet TAKE 1 TABLET THREE TIMES DAILY AS NEEDED  FOR  DIZZINESS  OR  NAUSEA  AS DIRECTED 90 tablet 1   Multiple Vitamin (MULTIVITAMIN WITH MINERALS) TABS tablet Take 1 tablet by mouth daily.     potassium chloride (KLOR-CON) 10 MEQ tablet Take 1 tablet (10 mEq total) by mouth daily. 90 tablet 1   RESTASIS 0.05 % ophthalmic emulsion      traMADol (ULTRAM) 50 MG tablet Take 1 tablet (50 mg total) by mouth every 8 (eight) hours as needed. 90 tablet 0   vitamin B-12 (CYANOCOBALAMIN) 1000 MCG tablet Take 1,000 mcg by mouth daily.     warfarin (COUMADIN) 5 MG tablet Take 0.5-1 tablets (2.5-5 mg total) by mouth See admin instructions. Take 5 mg daily on Monday and Thursday. Take 2.5 mg all other days. 180 tablet 1   No facility-administered medications prior to visit.    Allergies  Allergen Reactions   Penicillins     Childhood allergy Has patient had a PCN reaction causing immediate rash, facial/tongue/throat swelling, SOB or lightheadedness with hypotension: Unknown Has patient had a PCN reaction causing severe rash involving mucus membranes or skin necrosis: Unknown Has patient had a PCN reaction that required hospitalization: Unknown Has  patient had a PCN reaction occurring within the last 10 years: No If all of the above answers are "NO", then may proceed with Cephalosporin use.     ROS Review of Systems    Objective:    Physical Exam Constitutional:      Appearance: He is well-developed.  HENT:     Head: Normocephalic and atraumatic.     Right Ear: External ear normal.     Left Ear: External ear normal.     Nose: Nose normal.  Eyes:     Conjunctiva/sclera: Conjunctivae normal.  Pupils: Pupils are equal, round, and reactive to light.  Neck:     Thyroid: No thyromegaly.  Cardiovascular:     Rate and Rhythm: Normal rate and regular rhythm.     Heart sounds: Normal heart sounds.  Pulmonary:     Effort: Pulmonary effort is normal.     Breath sounds: Normal breath sounds.  Abdominal:     General: Bowel sounds are normal. There is no distension.     Palpations: Abdomen is soft. There is no mass.     Tenderness: There is no abdominal tenderness. There is no guarding or rebound.  Musculoskeletal:        General: Normal range of motion.     Cervical back: Normal range of motion and neck supple.  Lymphadenopathy:     Cervical: No cervical adenopathy.  Skin:    General: Skin is warm and dry.  Neurological:     Mental Status: He is alert and oriented to person, place, and time.     Deep Tendon Reflexes: Reflexes are normal and symmetric.  Psychiatric:        Behavior: Behavior normal.        Thought Content: Thought content normal.        Judgment: Judgment normal.     BP 128/72    Pulse 77    Ht 6\' 2"  (1.88 m)    Wt 285 lb (129.3 kg)    SpO2 100%    BMI 36.59 kg/m  Wt Readings from Last 3 Encounters:  06/23/20 285 lb (129.3 kg)  05/04/20 294 lb (133.4 kg)  03/08/20 290 lb (131.5 kg)     There are no preventive care reminders to display for this patient.  There are no preventive care reminders to display for this patient.  Lab Results  Component Value Date   TSH 3.18 04/30/2017   Lab  Results  Component Value Date   WBC 6.3 01/07/2020   HGB 14.0 01/07/2020   HCT 41.5 01/07/2020   MCV 91.6 01/07/2020   PLT 194 01/07/2020   Lab Results  Component Value Date   NA 141 01/07/2020   K 4.1 01/07/2020   CO2 30 01/07/2020   GLUCOSE 113 (H) 01/07/2020   BUN 18 01/07/2020   CREATININE 0.85 01/07/2020   BILITOT 3.0 (H) 01/07/2020   ALKPHOS 68 04/30/2017   AST 25 01/07/2020   ALT 31 01/07/2020   PROT 6.2 01/07/2020   ALBUMIN 3.5 (L) 04/30/2017   CALCIUM 8.7 01/07/2020   ANIONGAP 6 08/02/2018   Lab Results  Component Value Date   CHOL 133 04/06/2019   Lab Results  Component Value Date   HDL 38 (L) 04/06/2019   Lab Results  Component Value Date   LDLCALC 80 04/06/2019   Lab Results  Component Value Date   TRIG 70 04/06/2019   Lab Results  Component Value Date   CHOLHDL 3.5 04/06/2019   Lab Results  Component Value Date   HGBA1C 4.9 10/06/2018      Assessment & Plan:   Problem List Items Addressed This Visit      Cardiovascular and Mediastinum   ESSENTIAL HYPERTENSION, BENIGN   Relevant Orders   Lipid panel   COMPLETE METABOLIC PANEL WITH GFR   PSA   CBC   Atrial fibrillation (HCC)   Relevant Orders   Lipid panel   COMPLETE METABOLIC PANEL WITH GFR   PSA   POCT INR (Completed)   CBC    Other Visit Diagnoses  Wellness examination    -  Primary   Relevant Orders   Lipid panel   COMPLETE METABOLIC PANEL WITH GFR   PSA   CBC   Need for tetanus, diphtheria, and acellular pertussis (Tdap) vaccine in patient of adolescent age or older       Relevant Medications   Tdap (BOOSTRIX) 5-2.5-18.5 LF-MCG/0.5 injection   SOB (shortness of breath)       Relevant Orders   ECHOCARDIOGRAM COMPLETE   Leg wound, right, initial encounter         Keep up a regular exercise program and make sure you are eating a healthy diet Try to eat 4 servings of dairy a day, or if you are lactose intolerant take a calcium with vitamin D daily.  Your vaccines  are up to date.  Due for Tdap and prescription given today. Due for labs.    Meds ordered this encounter  Medications   Tdap (BOOSTRIX) 5-2.5-18.5 LF-MCG/0.5 injection    Sig: Inject 0.5 mLs into the muscle once for 1 dose.    Dispense:  0.5 mL    Refill:  0    Follow-up: Return in about 5 days (around 06/28/2020) for recheck leg .    Beatrice Lecher, MD

## 2020-06-23 NOTE — Progress Notes (Signed)
Acute Office Visit  Subjective:    Patient ID: Jerome Hicks, male    DOB: 1951/09/06, 69 y.o.   MRN: [id number]  Chief Complaint  Patient presents with  . Annual Exam    R lower leg #1- 3.63mm #2- 1.36mm  . Atrial Fibrillation    HPI Patient is in today -also complains of persistent shortness of breath.  He says he gets easily short of breath with just minimal activity.  He does have a history of A. fib as well as an elevated hemidiaphragm.  He had a chest CT in April with no worrisome findings such as cardiac pericardial effusion or lung abnormality.  He does have stable mild ectasia of extending a thoracic aorta measuring 4.3 cm.    CT Chest WO IV Contrast  Impression  IMPRESSION:  No pulmonary nodule or mass. No fibrosis.     Electronically Signed by: Claybon Jabs, MD Narrative  CHEST CT WITHOUT INTRAVENOUS CONTRAST:   TECHNIQUE: Multiple axial CT images of the chest without intravenous contrast. Two sets of coronal reformations, including MIP images, were performed from the axial data. CT dose reduction techniques were utilized.   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 No discrete hilar adenopathy, however evaluation may be difficult to lack of contrast.  Heart: Heart is normal in size. No pericardial effusion. Mild coronary arterial calcification. Stable mild ectasia of the ascending thoracic aorta measuring up to 4.3 cm.  Lungs and pleura: Stable chronic atelectasis in the right lower lobe. The lungs are otherwise clear.No pleural effusion. Patent central airways. No pleural nodularity or calcification. There is stable elevation of the right hemidiaphragm.  Bones and chest wall: No acute osseous abnormality There is bilateral gynecomastia. No axillary adenopathy.  Upper abdomen: The gallbladder is surgically  absent.    He also has a wound on the posterior right leg.  He says about a week ago he noticed some blisters on the back of his leg and he popped them.  He now has 2 scabs posteriorly he has more swelling in that right lower leg compared to his left.  Past Medical History:  Diagnosis Date  . Arthritis    oa  . 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 Ascending aortic aneurysm ([place])    seen on 01-31-18 ct chest ; patient denies awareness  . Atrial fibrillation ([place])   . Cataracts, bilateral   . ED (erectile dysfunction)    has been prescribed Levitra and Viagrain in the past  . Hypertension   . Internal hemorrhoids   . Kidney stones   . Leg edema    recurrent cellulitis  . Scrotal abscess 3-03  . Shoulder arthritis   . Sigmoid diverticulosis     Past Surgical History:  Procedure Laterality Date  .  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 CHOLECYSTECTOMY  1971  . kidney stones  1990's   Ureteroscopic stone extraction  . TONSILLECTOMY    . 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 Heart attack Mother 65  . Heart attack Father 80  . Heart attack Sister 29  . Heart attack Brother  89  . Multiple sclerosis Daughter     Social History   Socioeconomic History  . Marital status: Widowed    Spouse name: Not on file  . Number of children: 2  . Years of education: 33  . Highest education level: Associate degree: occupational, Hotel manager, or vocational program  Occupational History  . Occupation: On disability     Comment: retired  Tobacco Use  . Smoking status: Never Smoker  . Smokeless tobacco: Current User    Types: Chew  Vaping Use  . Vaping Use: Never used  Substance and Sexual Activity  . Alcohol use: No  . Drug use: No  . Sexual activity: Not  Currently  Other Topics Concern  . Not on file  Social History Narrative   Staying active   Splits wood   Social Determinants of Health   Financial Resource Strain: Low Risk   . Difficulty of Paying Living Expenses: Not hard at all  Food Insecurity: No Food Insecurity  . Worried About Charity fundraiser in the Last Year: Never true  . Ran Out of Food in the Last Year: Never true  Transportation Needs: No Transportation Needs  . Lack of Transportation (Medical): No  . Lack of Transportation (Non-Medical): No  Physical Activity: Inactive  . Days of Exercise per Week: 0 days  . Minutes of Exercise per Session: 0 min  Stress: No Stress Concern Present  . Feeling of Stress : Not at all  Social Connections: Socially Isolated  . Frequency of Communication with Friends and Family: More than three times a week  . Frequency of Social Gatherings with Friends and Family: Once a week  . Attends Religious Services: Never  . Active Member of Clubs or Organizations: No  . Attends Archivist Meetings: Never  . Marital Status: Widowed  Intimate Partner Violence: Not At Risk  . Fear of Current or Ex-Partner: No  . Emotionally Abused: No  . Physically Abused: No  . Sexually Abused: No    Outpatient Medications Prior to Visit  Medication Sig Dispense Refill  . albuterol (PROVENTIL HFA;VENTOLIN HFA) 108 (90 Base) MCG/ACT inhaler Inhale 1-2 puffs into the lungs every 6 (six) hours as needed for wheezing or shortness of breath. 1 Inhaler 2  . Carboxymethylcellul-Glycerin (LUBRICATING EYE DROPS OP) Place 1 drop into both eyes daily as needed (dry eyes).    . carvedilol (COREG) 3.125 MG tablet Take 1 tablet (3.125 mg total) by mouth 2 (two) times daily with a meal. 180 tablet 3  . clotrimazole-betamethasone (LOTRISONE) cream Apply 1 application topically daily as needed (rash).     Marland Kitchen doxazosin (CARDURA) 4 MG tablet Take 1 tablet (4 mg total) by mouth daily. 90 tablet 1  . furosemide  (LASIX) 40 MG tablet Take 1- 2 tablets daily as needed for edema. 180 tablet 0  . lisinopril (ZESTRIL) 20 MG tablet Take 1 tablet (20 mg total) by mouth daily. 90 tablet 1  . meclizine (ANTIVERT) 25 MG tablet TAKE 1 TABLET THREE TIMES DAILY AS NEEDED  FOR  DIZZINESS  OR  NAUSEA  AS DIRECTED 90 tablet 1  . Multiple Vitamin (MULTIVITAMIN WITH MINERALS) TABS tablet Take 1 tablet by mouth daily.    . potassium chloride (KLOR-CON) 10 MEQ tablet Take 1 tablet (10 mEq total) by mouth daily. 90 tablet 1  . RESTASIS 0.05 % ophthalmic emulsion     . traMADol (ULTRAM) 50 MG tablet Take 1 tablet (50 mg total) by mouth every  8 (eight) hours as needed. 90 tablet 0  . vitamin B-12 (CYANOCOBALAMIN) 1000 MCG tablet Take 1,000 mcg by mouth daily.    Marland Kitchen warfarin (COUMADIN) 5 MG tablet Take 0.5-1 tablets (2.5-5 mg total) by mouth See admin instructions. Take 5 mg daily on Monday and Thursday. Take 2.5 mg all other days. 180 tablet 1   No facility-administered medications prior to visit.    Allergies  Allergen Reactions  . Penicillins     Childhood allergy Has patient had a PCN reaction causing immediate rash, facial/tongue/throat swelling, SOB or lightheadedness with hypotension: Unknown Has patient had a PCN reaction causing severe rash involving mucus membranes or skin necrosis: Unknown Has patient had a PCN reaction that required hospitalization: Unknown Has patient had a PCN reaction occurring within the last 10 years: No If all of the above answers are "NO", then may proceed with Cephalosporin use.     Review of Systems     Objective:    Physical Exam Constitutional:      Appearance: He is well-developed.  HENT:     Head: Normocephalic and atraumatic.  Cardiovascular:     Rate and Rhythm: Normal rate and regular rhythm.     Heart sounds: Normal heart sounds.  Pulmonary:     Effort: Pulmonary effort is normal.     Breath sounds: Normal breath sounds.  Musculoskeletal:     Comments: 2+ edema  of the right lower leg, 1+ LL edema on the left. 2 larger scabs on the posterior right  Posterior calf area.  Largest lesion is oval-shaped and is approximately 1 x 3 inches.  The smaller one is more narrow and is approximately half an inch by 1 inch.  Skin:    General: Skin is warm and dry.  Neurological:     Mental Status: He is alert and oriented to person, place, and time.  Psychiatric:        Behavior: Behavior normal.     BP 128/72   Pulse 77   Ht 6\' 2"  (1.88 m)   Wt 285 lb (129.3 kg)   SpO2 100%   BMI 36.59 kg/m  Wt Readings from Last 3 Encounters:  06/23/20 285 lb (129.3 kg)  05/04/20 294 lb (133.4 kg)  03/08/20 290 lb (131.5 kg)    There are no preventive care reminders to display for this patient.  There are no preventive care reminders to display for this patient.   Lab Results  Component Value Date   TSH 3.18 04/30/2017   Lab Results  Component Value Date   WBC 6.3 01/07/2020   HGB 14.0 01/07/2020   HCT 41.5 01/07/2020   MCV 91.6 01/07/2020   PLT 194 01/07/2020   Lab Results  Component Value Date   NA 141 01/07/2020   K 4.1 01/07/2020   CO2 30 01/07/2020   GLUCOSE 113 (H) 01/07/2020   BUN 18 01/07/2020   CREATININE 0.85 01/07/2020   BILITOT 3.0 (H) 01/07/2020   ALKPHOS 68 04/30/2017   AST 25 01/07/2020   ALT 31 01/07/2020   PROT 6.2 01/07/2020   ALBUMIN 3.5 (L) 04/30/2017   CALCIUM 8.7 01/07/2020   ANIONGAP 6 08/02/2018   Lab Results  Component Value Date   CHOL 133 04/06/2019   Lab Results  Component Value Date   HDL 38 (L) 04/06/2019   Lab Results  Component Value Date   LDLCALC 80 04/06/2019   Lab Results  Component Value Date   TRIG 70 04/06/2019  Lab Results  Component Value Date   CHOLHDL 3.5 04/06/2019   Lab Results  Component Value Date   HGBA1C 4.9 10/06/2018       Assessment & Plan:   Shortness of breath-he has several reasons to be short of breath he has an elevated hemidiaphragm on imaging.  He does have  atrial fibrillation as well as known restrictive lung disease and asbestosis.  But he feels like this is more recent.  So we will go ahead and get him scheduled for echocardiogram for further work-up.  Wound of the posterior RIGHT lower leg where he popped a blister he has 2 large scabs and has 1+ pitting edema in that leg.  We discussed applying Vaseline twice a day and starting to wear his compression stockings again I want a recheck the area Monday or Tuesday to make sure that it is healing and not turning into an ulceration.  He has had problems with venous stasis ulcers in the past and is required repeat palliative Unna boot treatment.   Meds ordered this encounter  Medications  . Tdap (BOOSTRIX) 5-2.5-18.5 LF-MCG/0.5 injection    Sig: Inject 0.5 mLs into the muscle once for 1 dose.    Dispense:  0.5 mL    Refill:  0     Beatrice Lecher, MD

## 2020-06-23 NOTE — Patient Instructions (Addendum)
Please get your tetanus vaccine updated at the local pharmacy.      Health Maintenance After Age 70 After age 47, you are at a higher risk for certain long-term diseases and infections as well as injuries from falls. Falls are a major cause of broken bones and head injuries in people who are older than age 49. Getting regular preventive care can help to keep you healthy and well. Preventive care includes getting regular testing and making lifestyle changes as recommended by your health care provider. Talk with your health care provider about:  Which screenings and tests you should have. A screening is a test that checks for a disease when you have no symptoms.  A diet and exercise plan that is right for you. What should I know about screenings and tests to prevent falls? Screening and testing are the best ways to find a health problem early. Early diagnosis and treatment give you the best chance of managing medical conditions that are common after age 42. Certain conditions and lifestyle choices may make you more likely to have a fall. Your health care provider may recommend:  Regular vision checks. Poor vision and conditions such as cataracts can make you more likely to have a fall. If you wear glasses, make sure to get your prescription updated if your vision changes.  Medicine review. Work with your health care provider to regularly review all of the medicines you are taking, including over-the-counter medicines. Ask your health care provider about any side effects that may make you more likely to have a fall. Tell your health care provider if any medicines that you take make you feel dizzy or sleepy.  Osteoporosis screening. Osteoporosis is a condition that causes the bones to get weaker. This can make the bones weak and cause them to break more easily.  Blood pressure screening. Blood pressure changes and medicines to control blood pressure can make you feel dizzy.  Strength and balance  checks. Your health care provider may recommend certain tests to check your strength and balance while standing, walking, or changing positions.  Foot health exam. Foot pain and numbness, as well as not wearing proper footwear, can make you more likely to have a fall.  Depression screening. You may be more likely to have a fall if you have a fear of falling, feel emotionally low, or feel unable to do activities that you used to do.  Alcohol use screening. Using too much alcohol can affect your balance and may make you more likely to have a fall. What actions can I take to lower my risk of falls? General instructions  Talk with your health care provider about your risks for falling. Tell your health care provider if: ? You fall. Be sure to tell your health care provider about all falls, even ones that seem minor. ? You feel dizzy, sleepy, or off-balance.  Take over-the-counter and prescription medicines only as told by your health care provider. These include any supplements.  Eat a healthy diet and maintain a healthy weight. A healthy diet includes low-fat dairy products, low-fat (lean) meats, and fiber from whole grains, beans, and lots of fruits and vegetables. Home safety  Remove any tripping hazards, such as rugs, cords, and clutter.  Install safety equipment such as grab bars in bathrooms and safety rails on stairs.  Keep rooms and walkways well-lit. Activity   Follow a regular exercise program to stay fit. This will help you maintain your balance. Ask your health care provider  what types of exercise are appropriate for you.  If you need a cane or walker, use it as recommended by your health care provider.  Wear supportive shoes that have nonskid soles. Lifestyle  Do not drink alcohol if your health care provider tells you not to drink.  If you drink alcohol, limit how much you have: ? 0-1 drink a day for women. ? 0-2 drinks a day for men.  Be aware of how much alcohol is  in your drink. In the [place] one drink equals one typical bottle of beer (12 oz), one-half glass of wine (5 oz), or one shot of hard liquor (1 oz).  Do not use any products that contain nicotine or tobacco, such as cigarettes and e-cigarettes. If you need help quitting, ask your health care provider. Summary  Having a healthy lifestyle and getting preventive care can help to protect your health and wellness after age 40.  Screening and testing are the best way to find a health problem early and help you avoid having a fall. Early diagnosis and treatment give you the best chance for managing medical conditions that are more common for people who are older than age 36.  Falls are a major cause of broken bones and head injuries in people who are older than age 97. Take precautions to prevent a fall at home.  Work with your health care provider to learn what changes you can make to improve your health and wellness and to prevent falls. This information is not intended to replace advice given to you by your health care provider. Make sure you discuss any questions you have with your health care provider. Document Revised: 03/12/2019 Document Reviewed: 10/02/2017 Elsevier Patient Education  2020 [employer].

## 2020-06-24 NOTE — Progress Notes (Signed)
All labs are normal. 

## 2020-06-28 ENCOUNTER — Other Ambulatory Visit: Payer: Self-pay

## 2020-06-28 ENCOUNTER — Ambulatory Visit (INDEPENDENT_AMBULATORY_CARE_PROVIDER_SITE_OTHER): Payer: Medicare HMO | Admitting: Family Medicine

## 2020-06-28 VITALS — BP 130/74 | HR 72 | Ht 74.0 in | Wt 285.0 lb

## 2020-06-28 DIAGNOSIS — S81801A Unspecified open wound, right lower leg, initial encounter: Secondary | ICD-10-CM | POA: Diagnosis not present

## 2020-06-28 NOTE — Progress Notes (Signed)
Established Patient Office Visit  Subjective:  Patient ID: Jerome Hicks, male    DOB: 30-Jul-1951  Age: 69 y.o. MRN: [id number]  CC:  Chief Complaint  Patient presents with  . Wound Check    HPI Jerome Hicks presents for f/u left leg wound.  Jerome Hicks come back today to recheck the wound to make sure it was healing.  Treatment concluded applying Vaseline daily wearing Jerome Hicks compression stockings which she has been doing and taking Jerome Hicks diuretic daily.  Jerome Hicks says that has helped in fact Jerome Hicks weight is down about 3 pounds.  Jerome Hicks does feel like it looks better Jerome Hicks has not been having a lot of pain or discomfort.  Jerome Hicks has not had any active drainage in several days.  Past Medical History:  Diagnosis Date  . Arthritis    oa  . 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 Ascending aortic aneurysm ([place])    seen on 01-31-18 ct chest ; patient denies awareness  . Atrial fibrillation ([place])   . Cataracts, bilateral   . ED (erectile dysfunction)    has been prescribed Levitra and Viagrain in the past  . Hypertension   . Internal hemorrhoids   . Kidney stones   . Leg edema    recurrent cellulitis  . Scrotal abscess 3-03  . Shoulder arthritis   . Sigmoid diverticulosis     Past Surgical History:  Procedure Laterality Date  .  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 CHOLECYSTECTOMY  1971  . kidney stones  1990's   Ureteroscopic stone extraction  . TONSILLECTOMY    . 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 Heart attack Mother 36  . Heart attack Father 49  . Heart attack Sister 88  . Heart attack Brother 3  . Multiple sclerosis Daughter     Social History   Socioeconomic History  . Marital status: Widowed    Spouse name: Not on file  . Number of children: 2  . Years of  education: 56  . Highest education level: Associate degree: occupational, Hotel manager, or vocational program  Occupational History  . Occupation: On disability     Comment: retired  Tobacco Use  . Smoking status: Never Smoker  . Smokeless tobacco: Current User    Types: Chew  Vaping Use  . Vaping Use: Never used  Substance and Sexual Activity  . Alcohol use: No  . Drug use: No  . Sexual activity: Not Currently  Other Topics Concern  . Not on file  Social History Narrative   Staying active   Splits wood   Social Determinants of Health   Financial Resource Strain: Low Risk   . Difficulty of Paying Living Expenses: Not hard at all  Food Insecurity: No Food Insecurity  . Worried About Charity fundraiser in the Last Year: Never true  . Ran Out of Food in the Last Year: Never true  Transportation Needs: No Transportation Needs  . Lack of Transportation (Medical): No  . Lack of Transportation (Non-Medical): No  Physical Activity: Inactive  . Days of Exercise per Week: 0 days  . Minutes of Exercise per Session: 0 min  Stress: No Stress Concern Present  . Feeling of Stress : Not at all  Social  Connections: Socially Isolated  . Frequency of Communication with Friends and Family: More than three times a week  . Frequency of Social Gatherings with Friends and Family: Once a week  . Attends Religious Services: Never  . Active Member of Clubs or Organizations: No  . Attends Archivist Meetings: Never  . Marital Status: Widowed  Intimate Partner Violence: Not At Risk  . Fear of Current or Ex-Partner: No  . Emotionally Abused: No  . Physically Abused: No  . Sexually Abused: No    Outpatient Medications Prior to Visit  Medication Sig Dispense Refill  . albuterol (PROVENTIL HFA;VENTOLIN HFA) 108 (90 Base) MCG/ACT inhaler Inhale 1-2 puffs into the lungs every 6 (six) hours as needed for wheezing or shortness of breath. 1 Inhaler 2  . Carboxymethylcellul-Glycerin  (LUBRICATING EYE DROPS OP) Place 1 drop into both eyes daily as needed (dry eyes).    . carvedilol (COREG) 3.125 MG tablet Take 1 tablet (3.125 mg total) by mouth 2 (two) times daily with a meal. 180 tablet 3  . clotrimazole-betamethasone (LOTRISONE) cream Apply 1 application topically daily as needed (rash).     Marland Kitchen doxazosin (CARDURA) 4 MG tablet Take 1 tablet (4 mg total) by mouth daily. 90 tablet 1  . furosemide (LASIX) 40 MG tablet Take 1- 2 tablets daily as needed for edema. 180 tablet 0  . lisinopril (ZESTRIL) 20 MG tablet Take 1 tablet (20 mg total) by mouth daily. 90 tablet 1  . meclizine (ANTIVERT) 25 MG tablet TAKE 1 TABLET THREE TIMES DAILY AS NEEDED  FOR  DIZZINESS  OR  NAUSEA  AS DIRECTED 90 tablet 1  . Multiple Vitamin (MULTIVITAMIN WITH MINERALS) TABS tablet Take 1 tablet by mouth daily.    . potassium chloride (KLOR-CON) 10 MEQ tablet Take 1 tablet (10 mEq total) by mouth daily. 90 tablet 1  . RESTASIS 0.05 % ophthalmic emulsion     . traMADol (ULTRAM) 50 MG tablet Take 1 tablet (50 mg total) by mouth every 8 (eight) hours as needed. 90 tablet 0  . vitamin B-12 (CYANOCOBALAMIN) 1000 MCG tablet Take 1,000 mcg by mouth daily.    Marland Kitchen warfarin (COUMADIN) 5 MG tablet Take 0.5-1 tablets (2.5-5 mg total) by mouth See admin instructions. Take 5 mg daily on Monday and Thursday. Take 2.5 mg all other days. 180 tablet 1   No facility-administered medications prior to visit.    Allergies  Allergen Reactions  . Penicillins     Childhood allergy Has patient had a PCN reaction causing immediate rash, facial/tongue/throat swelling, SOB or lightheadedness with hypotension: Unknown Has patient had a PCN reaction causing severe rash involving mucus membranes or skin necrosis: Unknown Has patient had a PCN reaction that required hospitalization: Unknown Has patient had a PCN reaction occurring within the last 10 years: No If all of the above answers are "NO", then may proceed with Cephalosporin  use.     ROS Review of Systems    Objective:    Physical Exam        Largest wound measures 1.2 x 2.4 cm.  The smaller sliver shaped wound measures approximately 0.4 x 0.9 cm.  No active drainage.  BP (!) 130/74   Pulse 72   Ht 6\' 2"  (1.88 m)   Wt (!) 285 lb (129.3 kg)   SpO2 98%   BMI 36.59 kg/m  Wt Readings from Last 3 Encounters:  06/28/20 (!) 285 lb (129.3 kg)  06/23/20 285 lb (129.3 kg)  05/04/20  294 lb (133.4 kg)     There are no preventive care reminders to display for this patient.  There are no preventive care reminders to display for this patient.  Lab Results  Component Value Date   TSH 3.18 04/30/2017   Lab Results  Component Value Date   WBC 6.4 06/23/2020   HGB 14.5 06/23/2020   HCT 42.7 06/23/2020   MCV 93.2 06/23/2020   PLT 211 06/23/2020   Lab Results  Component Value Date   NA 140 06/23/2020   K 4.8 06/23/2020   CO2 31 06/23/2020   GLUCOSE 114 (H) 06/23/2020   BUN 16 06/23/2020   CREATININE 0.92 06/23/2020   BILITOT 3.3 (H) 06/23/2020   ALKPHOS 68 04/30/2017   AST 23 06/23/2020   ALT 24 06/23/2020   PROT 6.4 06/23/2020   ALBUMIN 3.5 (L) 04/30/2017   CALCIUM 9.5 06/23/2020   ANIONGAP 6 08/02/2018   Lab Results  Component Value Date   CHOL 135 06/23/2020   Lab Results  Component Value Date   HDL 45 06/23/2020   Lab Results  Component Value Date   LDLCALC 75 06/23/2020   Lab Results  Component Value Date   TRIG 72 06/23/2020   Lab Results  Component Value Date   CHOLHDL 3.0 06/23/2020   Lab Results  Component Value Date   HGBA1C 4.9 10/06/2018      Assessment & Plan:   Problem List Items Addressed This Visit    None    Visit Diagnoses    Leg wound, right, initial encounter    -  Primary     Leg wound is healing well.  No sign of cellulitis or active drainage at this point.  Continue current therapy with topical Vaseline, compression stockings, elevating the feet, and continuing to use Jerome Hicks diuretic  regularly.  Follow-up in 1 week for recheck.  If at that point it is continuing to heal well then we will just follow as needed.  No orders of the defined types were placed in this encounter.   Follow-up: Return in about 1 week (around 07/05/2020) for recheck wound .   Time spent 15 minutes in encounter.   Beatrice Lecher, MD

## 2020-07-05 ENCOUNTER — Ambulatory Visit (INDEPENDENT_AMBULATORY_CARE_PROVIDER_SITE_OTHER): Payer: Medicare HMO | Admitting: Family Medicine

## 2020-07-05 ENCOUNTER — Other Ambulatory Visit: Payer: Self-pay

## 2020-07-05 VITALS — BP 146/84 | HR 82 | Wt 287.0 lb

## 2020-07-05 DIAGNOSIS — S81801D Unspecified open wound, right lower leg, subsequent encounter: Secondary | ICD-10-CM | POA: Diagnosis not present

## 2020-07-05 NOTE — Progress Notes (Signed)
Agree with documentation as above.   Jerome Payano, MD  

## 2020-07-05 NOTE — Progress Notes (Signed)
Acute Office Visit  Subjective:    Patient ID: Jerome Hicks, male    DOB: 10-13-1951, 69 y.o.   MRN: [id number]  Chief Complaint  Patient presents with   Wound Check    HPI Patient is in today for wound check. The wound is mostly healed. He has an area still pink that measures about 2 cm.   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)    get yearly PFT's   Ascending aortic aneurysm ([place])    seen on 01-31-18 ct chest ; patient denies awareness   Atrial fibrillation ([place])    Cataracts, bilateral    ED (erectile dysfunction)    has been prescribed Levitra and Viagrain in the past   Hypertension    Internal hemorrhoids    Kidney stones    Leg edema    recurrent cellulitis   Scrotal abscess 3-03   Shoulder arthritis    Sigmoid diverticulosis     Past Surgical History:  Procedure Laterality Date    MRSA infection in left groin requiring surgical debridement  12-08   CHOLECYSTECTOMY  1971   kidney stones  1990's   Ureteroscopic stone extraction   TONSILLECTOMY     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   Procedure: RIGHT TOTAL KNEE ARTHROPLASTY;  Surgeon: Dorna Leitz, MD;  Location: [facility];  Service: Orthopedics;  Laterality: Right;    Family History  Problem Relation Age of Onset   Heart attack Mother 29   Heart attack Father 38   Heart attack Sister 33   Heart attack Brother 87   Multiple sclerosis Daughter     Social History   Socioeconomic History   Marital status: Widowed    Spouse name: Not on file   Number of children: 2   Years of education: 14   Highest education level: Associate degree: occupational, Hotel manager, or vocational program  Occupational History   Occupation: On disability     Comment: retired  Tobacco Use   Smoking status: Never Smoker   Smokeless tobacco: Current User    Types: Nurse, children's Use: Never used  Substance and Sexual Activity   Alcohol use:  No   Drug use: No   Sexual activity: Not Currently  Other Topics Concern   Not on file  Social History Narrative   Staying active   Splits wood   Social Determinants of Health   Financial Resource Strain: Low Risk    Difficulty of Paying Living Expenses: Not hard at all  Food Insecurity: No Food Insecurity   Worried About Charity fundraiser in the Last Year: Never true   [place] in the Last Year: Never true  Transportation Needs: No Transportation Needs   Lack of Transportation (Medical): No   Lack of Transportation (Non-Medical): No  Physical Activity: Inactive   Days of Exercise per Week: 0 days   Minutes of Exercise per Session: 0 min  Stress: No Stress Concern Present   Feeling of Stress : Not at all  Social Connections: Socially Isolated   Frequency of Communication with Friends and Family: More than three times a week   Frequency of Social Gatherings with Friends and Family: Once a week   Attends Religious Services: Never   Marine scientist or Organizations: No   Attends Archivist Meetings: Never   Marital Status: Widowed  Intimate Partner Violence: Not At Risk  Fear of Current or Ex-Partner: No   Emotionally Abused: No   Physically Abused: No   Sexually Abused: No    Outpatient Medications Prior to Visit  Medication Sig Dispense Refill   albuterol (PROVENTIL HFA;VENTOLIN HFA) 108 (90 Base) MCG/ACT inhaler Inhale 1-2 puffs into the lungs every 6 (six) hours as needed for wheezing or shortness of breath. 1 Inhaler 2   Carboxymethylcellul-Glycerin (LUBRICATING EYE DROPS OP) Place 1 drop into both eyes daily as needed (dry eyes).     carvedilol (COREG) 3.125 MG tablet Take 1 tablet (3.125 mg total) by mouth 2 (two) times daily with a meal. 180 tablet 3   clotrimazole-betamethasone (LOTRISONE) cream Apply 1 application topically daily as needed (rash).      doxazosin (CARDURA) 4 MG tablet Take 1 tablet (4 mg total) by  mouth daily. 90 tablet 1   furosemide (LASIX) 40 MG tablet Take 1- 2 tablets daily as needed for edema. 180 tablet 0   lisinopril (ZESTRIL) 20 MG tablet Take 1 tablet (20 mg total) by mouth daily. 90 tablet 1   meclizine (ANTIVERT) 25 MG tablet TAKE 1 TABLET THREE TIMES DAILY AS NEEDED  FOR  DIZZINESS  OR  NAUSEA  AS DIRECTED 90 tablet 1   Multiple Vitamin (MULTIVITAMIN WITH MINERALS) TABS tablet Take 1 tablet by mouth daily.     potassium chloride (KLOR-CON) 10 MEQ tablet Take 1 tablet (10 mEq total) by mouth daily. 90 tablet 1   RESTASIS 0.05 % ophthalmic emulsion      traMADol (ULTRAM) 50 MG tablet Take 1 tablet (50 mg total) by mouth every 8 (eight) hours as needed. 90 tablet 0   vitamin B-12 (CYANOCOBALAMIN) 1000 MCG tablet Take 1,000 mcg by mouth daily.     warfarin (COUMADIN) 5 MG tablet Take 0.5-1 tablets (2.5-5 mg total) by mouth See admin instructions. Take 5 mg daily on Monday and Thursday. Take 2.5 mg all other days. 180 tablet 1   No facility-administered medications prior to visit.    Allergies  Allergen Reactions   Penicillins     Childhood allergy Has patient had a PCN reaction causing immediate rash, facial/tongue/throat swelling, SOB or lightheadedness with hypotension: Unknown Has patient had a PCN reaction causing severe rash involving mucus membranes or skin necrosis: Unknown Has patient had a PCN reaction that required hospitalization: Unknown Has patient had a PCN reaction occurring within the last 10 years: No If all of the above answers are "NO", then may proceed with Cephalosporin use.     Review of Systems     Objective:    Physical Exam        BP (!) 146/84    Pulse 82    Wt 287 lb (130.2 kg)    SpO2 98%    BMI 36.85 kg/m  Wt Readings from Last 3 Encounters:  07/05/20 287 lb (130.2 kg)  06/28/20 (!) 285 lb (129.3 kg)  06/23/20 285 lb (129.3 kg)    Health Maintenance Due  Topic Date Due   INFLUENZA VACCINE  07/03/2020    There  are no preventive care reminders to display for this patient.   Lab Results  Component Value Date   TSH 3.18 04/30/2017   Lab Results  Component Value Date   WBC 6.4 06/23/2020   HGB 14.5 06/23/2020   HCT 42.7 06/23/2020   MCV 93.2 06/23/2020   PLT 211 06/23/2020   Lab Results  Component Value Date   NA 140 06/23/2020   K  4.8 06/23/2020   CO2 31 06/23/2020   GLUCOSE 114 (H) 06/23/2020   BUN 16 06/23/2020   CREATININE 0.92 06/23/2020   BILITOT 3.3 (H) 06/23/2020   ALKPHOS 68 04/30/2017   AST 23 06/23/2020   ALT 24 06/23/2020   PROT 6.4 06/23/2020   ALBUMIN 3.5 (L) 04/30/2017   CALCIUM 9.5 06/23/2020   ANIONGAP 6 08/02/2018   Lab Results  Component Value Date   CHOL 135 06/23/2020   Lab Results  Component Value Date   HDL 45 06/23/2020   Lab Results  Component Value Date   LDLCALC 75 06/23/2020   Lab Results  Component Value Date   TRIG 72 06/23/2020   Lab Results  Component Value Date   CHOLHDL 3.0 06/23/2020   Lab Results  Component Value Date   HGBA1C 4.9 10/06/2018       Assessment & Plan:  Wound check - Wound is healing. Patient advised to continue using the Vaseline on the wound, as directed until the wound has completely healed.    Problem List Items Addressed This Visit    None    Visit Diagnoses    Wound of right lower extremity, subsequent encounter    -  Primary       No orders of the defined types were placed in this encounter.    Lavell Luster, [place]

## 2020-07-14 ENCOUNTER — Ambulatory Visit (INDEPENDENT_AMBULATORY_CARE_PROVIDER_SITE_OTHER): Payer: Medicare HMO | Admitting: Sports Medicine

## 2020-07-14 ENCOUNTER — Other Ambulatory Visit: Payer: Self-pay

## 2020-07-14 VITALS — BP 155/92 | HR 72

## 2020-07-14 DIAGNOSIS — I482 Chronic atrial fibrillation, unspecified: Secondary | ICD-10-CM | POA: Diagnosis not present

## 2020-07-14 LAB — POCT INR: INR: 2 (ref 2.0–3.0)

## 2020-07-14 NOTE — Progress Notes (Signed)
Patient reports taking Coumadin as follows:  5 mg on Monday and Thursday 2.5 mg all other days   Jerome Hicks denies any missed doses of medication, extra doses, bleeding gums, nose bleeds, antibiotic use, hospitalization, blood in urine, blood in stool, dental procedures, any bruising, abnormal bleeding, or medication changes.  INR was checked today with a reading of 2.0. He will stay on his current dosage of medication. He has an appointment with Dr. Madilyn Fireman on 07/26/2020 and will recheck INR at that time.

## 2020-07-18 ENCOUNTER — Ambulatory Visit: Payer: Medicare HMO

## 2020-07-26 ENCOUNTER — Ambulatory Visit (INDEPENDENT_AMBULATORY_CARE_PROVIDER_SITE_OTHER): Payer: Medicare HMO | Admitting: Family Medicine

## 2020-07-26 ENCOUNTER — Other Ambulatory Visit: Payer: Self-pay | Admitting: Family Medicine

## 2020-07-26 ENCOUNTER — Telehealth: Payer: Self-pay | Admitting: *Deleted

## 2020-07-26 ENCOUNTER — Encounter: Payer: Self-pay | Admitting: Family Medicine

## 2020-07-26 VITALS — BP 134/79 | HR 81 | Ht 74.0 in | Wt 289.0 lb

## 2020-07-26 DIAGNOSIS — I482 Chronic atrial fibrillation, unspecified: Secondary | ICD-10-CM | POA: Diagnosis not present

## 2020-07-26 DIAGNOSIS — Z0000 Encounter for general adult medical examination without abnormal findings: Secondary | ICD-10-CM | POA: Diagnosis not present

## 2020-07-26 DIAGNOSIS — I10 Essential (primary) hypertension: Secondary | ICD-10-CM

## 2020-07-26 LAB — POCT INR: INR: 2 (ref 2.0–3.0)

## 2020-07-26 MED ORDER — RESTASIS 0.05 % OP EMUL
1.0000 [drp] | Freq: Two times a day (BID) | OPHTHALMIC | 0 refills | Status: DC
Start: 2020-07-26 — End: 2020-07-26

## 2020-07-26 MED ORDER — APIXABAN 5 MG PO TABS: 5.0000 mg | ORAL_TABLET | Freq: Two times a day (BID) | ORAL | 3 refills | Status: DC

## 2020-07-26 MED ORDER — LISINOPRIL 40 MG PO TABS: 40.0000 mg | ORAL_TABLET | Freq: Every day | ORAL | 0 refills | Status: DC

## 2020-07-26 NOTE — Progress Notes (Signed)
Subjective:   Jerome Hicks is a 69 y.o. male who presents for Medicare Annual/Subsequent preventive examination.  Review of Systems    Negative  Cardiac Risk Factors include: advanced age (>41men, >61 women);obesity (BMI >30kg/m2);male gender;hypertension     Objective:    Today's Vitals   07/26/20 1350 07/26/20 1422  BP: (!) 152/74 134/79  Pulse: 96 81  SpO2: 100%   Weight: 289 lb (131.1 kg)   Height: 6\' 2"  (1.88 m)    Body mass index is 37.11 kg/m.  Advanced Directives 07/15/2019 08/20/2018 08/01/2018 08/01/2018 07/25/2018 04/18/2015 04/15/2014  Does Patient Have a Medical Advance Directive? Yes Yes - Yes Yes Yes Patient has advance directive, copy not in chart  Type of Advance Directive [place];Living will [place];Living will Living will - Living will - -  Does patient want to make changes to medical advance directive? No - Patient declined - No - Patient declined - No - Patient declined - -  Copy of [place] in Chart? No - copy requested - - - - Yes Copy requested from family  Would patient like information on creating a medical advance directive? - - - - - - -    Current Medications (verified) Outpatient Encounter Medications as of 07/26/2020  Medication Sig  . albuterol (PROVENTIL HFA;VENTOLIN HFA) 108 (90 Base) MCG/ACT inhaler Inhale 1-2 puffs into the lungs every 6 (six) hours as needed for wheezing or shortness of breath.  . Carboxymethylcellul-Glycerin (LUBRICATING EYE DROPS OP) Place 1 drop into both eyes daily as needed (dry eyes).  . carvedilol (COREG) 3.125 MG tablet Take 1 tablet (3.125 mg total) by mouth 2 (two) times daily with a meal.  . clotrimazole-betamethasone (LOTRISONE) cream Apply 1 application topically daily as needed (rash).   Marland Kitchen doxazosin (CARDURA) 4 MG tablet Take 1 tablet (4 mg total) by mouth daily.  . furosemide (LASIX) 40 MG tablet Take 1- 2 tablets daily as needed for edema.  Marland Kitchen  lisinopril (ZESTRIL) 40 MG tablet Take 1 tablet (40 mg total) by mouth daily.  . meclizine (ANTIVERT) 25 MG tablet TAKE 1 TABLET THREE TIMES DAILY AS NEEDED  FOR  DIZZINESS  OR  NAUSEA  AS DIRECTED  . Multiple Vitamin (MULTIVITAMIN WITH MINERALS) TABS tablet Take 1 tablet by mouth daily.  . potassium chloride (KLOR-CON) 10 MEQ tablet Take 1 tablet (10 mEq total) by mouth daily.  . RESTASIS 0.05 % ophthalmic emulsion   . traMADol (ULTRAM) 50 MG tablet Take 1 tablet (50 mg total) by mouth every 8 (eight) hours as needed.  . vitamin B-12 (CYANOCOBALAMIN) 1000 MCG tablet Take 1,000 mcg by mouth daily.  Marland Kitchen warfarin (COUMADIN) 5 MG tablet Take 0.5-1 tablets (2.5-5 mg total) by mouth See admin instructions. Take 5 mg daily on Monday and Thursday. Take 2.5 mg all other days.  . [DISCONTINUED] cycloSPORINE (RESTASIS) 0.05 % ophthalmic emulsion Place 1 drop into both eyes 2 (two) times daily.  . [DISCONTINUED] lisinopril (ZESTRIL) 20 MG tablet Take 1 tablet (20 mg total) by mouth daily.   No facility-administered encounter medications on file as of 07/26/2020.    Allergies (verified) Penicillins   History: Past Medical History:  Diagnosis Date  . Arthritis    oa  . 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 Ascending aortic aneurysm ([place])    seen on 01-31-18 ct chest ; patient denies awareness  . Atrial fibrillation ([place])   . Cataracts, bilateral   .  ED (erectile dysfunction)    has been prescribed Levitra and Viagrain in the past  . Hypertension   . Internal hemorrhoids   . Kidney stones   . Leg edema    recurrent cellulitis  . Scrotal abscess 3-03  . Shoulder arthritis   . Sigmoid diverticulosis    Past Surgical History:  Procedure Laterality Date  .  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 CHOLECYSTECTOMY  1971  . kidney stones  1990's   Ureteroscopic stone extraction  . TONSILLECTOMY    . 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 Heart attack Mother 45  . Heart attack Father 38  . Heart attack Sister 37  . Heart attack Brother 104  . Multiple sclerosis Daughter    Social History   Socioeconomic History  . Marital status: Widowed    Spouse name: Not on file  . Number of children: 2  . Years of education: 58  . Highest education level: Associate degree: occupational, Hotel manager, or vocational program  Occupational History  . Occupation: On disability     Comment: retired  Tobacco Use  . Smoking status: Never Smoker  . Smokeless tobacco: Current User    Types: Chew  Vaping Use  . Vaping Use: Never used  Substance and Sexual Activity  . Alcohol use: No  . Drug use: No  . Sexual activity: Not Currently  Other Topics Concern  . Not on file  Social History Narrative   Staying active   Splits wood   Social Determinants of Health   Financial Resource Strain:   . Difficulty of Paying Living Expenses: Not on file  Food Insecurity:   . Worried About Charity fundraiser in the Last Year: Not on file  . Ran Out of Food in the Last Year: Not on file  Transportation Needs:   . Lack of Transportation (Medical): Not on file  . Lack of Transportation (Non-Medical): Not on file  Physical Activity:   . Days of Exercise per Week: Not on file  . Minutes of Exercise per Session: Not on file  Stress:   . Feeling of Stress : Not on file  Social Connections:   . Frequency of Communication with Friends and Family: Not on file  . Frequency of Social Gatherings with Friends and Family: Not on file  . Attends Religious Services: Not on file  . Active Member of Clubs or Organizations: Not on file  . Attends Archivist Meetings: Not on file  . Marital Status: Not on file    Tobacco Counseling Ready to quit: Not Answered Counseling given: Not  Answered   Clinical Intake:  Pre-visit preparation completed: Yes  Pain : No/denies pain     BMI - recorded: 37 Nutritional Status: BMI > 30  Obese Diabetes: No     Diabetic?No  Interpreter Needed?: No      Activities of Daily Living In your present state of health, do you have any difficulty performing the following activities: 07/26/2020  Hearing? N  Vision? N  Difficulty concentrating or making decisions? N  Walking or climbing stairs? Y  Comment knee pain and SOB  Dressing or bathing? N  Doing errands, shopping? N  Preparing Food and eating ? N  Using the Toilet? N  In the past six months, have you accidently leaked urine? N  Do you have problems with loss of bowel control? N  Managing your Medications? N  Managing your Finances? N  Housekeeping or managing your Housekeeping? N  Some recent data might be hidden    Patient Care Team: Hali Marry, MD as PCP - General  Indicate any recent Medical Services you may have received from other than [facility] providers in the past year (date may be approximate).     Assessment:   This is a routine wellness examination for Gray.  Physical Exam Constitutional:      Appearance: He is well-developed.  HENT:     Head: Normocephalic and atraumatic.  Cardiovascular:     Rate and Rhythm: Normal rate and regular rhythm.     Heart sounds: Normal heart sounds.  Pulmonary:     Effort: Pulmonary effort is normal.     Breath sounds: Normal breath sounds.  Skin:    General: Skin is warm and dry.  Neurological:     Mental Status: He is alert and oriented to person, place, and time.  Psychiatric:        Behavior: Behavior normal.      Hearing/Vision screen No exam data present  Dietary issues and exercise activities discussed: Current Exercise Habits: Home exercise routine, Type of exercise: walking, Time (Minutes): 10, Frequency (Times/Week): 5, Weekly Exercise (Minutes/Week): 50, Intensity: Mild  Goals     . Weight (lb) < 200 lb (90.7 kg)     Wants to loose weight. States wants to loose 100lbs.    . Weight (lb) < 200 lb (90.7 kg)     Recommend work on losing about 10 lbs over the next 3-4 months.  This will help with your swelling.  Encourage you to walk daily for about 20 minutes.        Depression Screen PHQ 2/9 Scores 06/23/2020 07/15/2019 06/08/2019 06/03/2018 04/09/2018 04/25/2017 07/01/2015  PHQ - 2 Score 0 0 0 0 0 0 0  PHQ- 9 Score - - - 3 - - -    Fall Risk Fall Risk  07/15/2019 06/08/2019 06/03/2018 04/09/2018 04/25/2017  Falls in the past year? 0 0 No No No  Number falls in past yr: - 0 - - -  Injury with Fall? 0 0 - - -  Follow up Falls prevention discussed - - - -    Any stairs in or around the home? No  If so, are there any without handrails? No  Home free of loose throw rugs in walkways, pet beds, electrical cords, etc? No  Adequate lighting in your home to reduce risk of falls? Yes   ASSISTIVE DEVICES UTILIZED TO PREVENT FALLS:  Life alert? No  Use of a cane, walker or w/c? No  Grab bars in the bathroom? No  Shower chair or bench in shower? No  Elevated toilet seat or a handicapped toilet? No   TIMED UP AND GO:  Was the test performed? No .  Length of time to ambulate 10 feet: NA   Gait steady and fast without use of assistive device  Cognitive Function:     6CIT Screen 06/23/2020 07/15/2019 06/08/2019 06/03/2018 04/25/2017  What Year? 0 points 0 points 0 points 0 points 0 points  What month? 0 points 0 points 0 points 0 points 0 points  What time? 0 points 0 points 0 points 0 points 0 points  Count back from 20 0 points 0 points 0  points 0 points 0 points  Months in reverse 0 points 0 points 0 points 0 points 0 points  Repeat phrase 0 points 0 points 4 points 4 points 6 points  Total Score 0 0 4 4 6     Immunizations Immunization History  Administered Date(s) Administered  . Influenza Split 08/21/2012  . Influenza Whole 08/27/2008, 11/07/2009, 10/18/2010, 08/20/2011   . Influenza, High Dose Seasonal PF 08/26/2017, 08/08/2018, 08/03/2019  . Influenza,inj,Quad PF,6+ Mos 08/25/2013, 08/05/2014, 08/31/2015, 08/28/2016  . PFIZER SARS-COV-2 Vaccination 02/12/2020, 03/05/2020  . Pneumococcal Conjugate-13 09/29/2013  . Pneumococcal Polysaccharide-23 07/14/2015  . Td 12/03/1994, 06/15/2009  . Zoster 06/18/2011  . Zoster Recombinat (Shingrix) 06/07/2018, 08/21/2018    TDAP status: Due, Education has been provided regarding the importance of this vaccine. Advised may receive this vaccine at local pharmacy or Health Dept. Aware to provide a copy of the vaccination record if obtained from local pharmacy or Health Dept. Verbalized acceptance and understanding. Flu Vaccine status: Up to date Pneumococcal vaccine status: Up to date Covid-19 vaccine status: Completed vaccines  Qualifies for Shingles Vaccine?UTD  Screening Tests Health Maintenance  Topic Date Due  . INFLUENZA VACCINE  10/26/2020 (Originally 07/03/2020)  . TETANUS/TDAP  12/03/2022 (Originally 06/16/2019)  . COLONOSCOPY  07/13/2022  . COVID-19 Vaccine  Completed  . Hepatitis C Screening  Completed  . PNA vac Low Risk Adult  Completed    Health Maintenance  There are no preventive care reminders to display for this patient.  Colorectal cancer screening: Completed 2020. Repeat every 10 years  Lung Cancer Screening: (Low Dose CT Chest recommended if Age 22-80 years, 30 pack-year currently smoking OR have quit w/in 15years.) does not qualify.   Lung Cancer Screening Referral: No  Additional Screening:  Hepatitis C Screening: Done Vision Screening: Recommended annual ophthalmology exams for early detection of glaucoma and other disorders of the eye. Is the patient up to date with their annual eye exam?  Yes  Who is the provider or what is the name of the office in which the patient attends annual eye exams? Dr. Luberta Mutter  Dental Screening: Recommended annual dental exams for proper oral  hygiene  Community Resource Referral / Chronic Care Management: CRR required this visit?  No   CCM required this visit?  No      Plan:     I have personally reviewed and noted the following in the patient's chart:   . Medical and social history . Use of alcohol, tobacco or illicit drugs  . Current medications and supplements . Functional ability and status . Nutritional status . Physical activity . Advanced directives . List of other physicians . Hospitalizations, surgeries, and ER visits in previous 12 months . Vitals . Screenings to include cognitive, depression, and falls . Referrals and appointments  In addition, I have reviewed and discussed with patient certain preventive protocols, quality metrics, and best practice recommendations. A written personalized care plan for preventive services as well as general preventive health recommendations were provided to patient.     Beatrice Lecher, MD   07/26/2020

## 2020-07-26 NOTE — Patient Instructions (Signed)
. ° °  Jerome Hicks , Thank you for taking time to come for your Medicare Wellness Visit. I appreciate your ongoing commitment to your health goals. Please review the following plan we discussed and let me know if I can assist you in the future.   These are the goals we discussed: Goals     Weight (lb) < 200 lb (90.7 kg)     Wants to loose weight. States wants to loose 100lbs.     Weight (lb) < 200 lb (90.7 kg)     Recommend work on losing about 10 lbs over the next 3-4 months.  This will help with your swelling.  Encourage you to walk daily for about 20 minutes.         This is a list of the screening recommended for you and due dates:  Health Maintenance  Topic Date Due   Flu Shot  07/03/2020   Tetanus Vaccine  12/03/2022*   Colon Cancer Screening  07/13/2022   COVID-19 Vaccine  Completed    Hepatitis C: One time screening is recommended by Center for Disease Control  (CDC) for  adults born from [birth year] through 1966.   Completed   Pneumonia vaccines  Completed  *Topic was postponed. The date shown is not the original due date.

## 2020-07-26 NOTE — Telephone Encounter (Signed)
Pt called and stated that he spoke w/[employer] and was told that Dr. Madilyn Fireman will need to seen a Tier execption to get either the xarelto or eliqius approved. He said that it may not cost him anything out of pocket.   eliquis 5 mg BID sent to mail order # 180 w/3 RF. Asked that pharmacy let us know if this will need a tier exception.

## 2020-07-26 NOTE — Progress Notes (Signed)
Pt here for INR check no missed doses, diet changes,bruising,bleeding,CP,SOB He is taking 5 mg on Monday and Thursday and 2.5 mg all other days.

## 2020-07-27 ENCOUNTER — Other Ambulatory Visit: Payer: Self-pay

## 2020-07-27 ENCOUNTER — Ambulatory Visit (HOSPITAL_BASED_OUTPATIENT_CLINIC_OR_DEPARTMENT_OTHER)
Admission: RE | Admit: 2020-07-27 | Discharge: 2020-07-27 | Disposition: A | Discharge status: Home / Self Care | Payer: Medicare HMO | Source: Ambulatory Visit | Attending: Family Medicine | Admitting: Family Medicine

## 2020-07-27 DIAGNOSIS — R0602 Shortness of breath: Secondary | ICD-10-CM

## 2020-07-27 LAB — ECHOCARDIOGRAM COMPLETE
Area-P 1/2: 3.75 cm2
S' Lateral: 3.44 cm

## 2020-08-05 ENCOUNTER — Other Ambulatory Visit: Payer: Self-pay | Admitting: Family Medicine

## 2020-08-05 DIAGNOSIS — I10 Essential (primary) hypertension: Secondary | ICD-10-CM

## 2020-08-23 ENCOUNTER — Ambulatory Visit (INDEPENDENT_AMBULATORY_CARE_PROVIDER_SITE_OTHER): Payer: Medicare HMO | Admitting: Family Medicine

## 2020-08-23 ENCOUNTER — Other Ambulatory Visit: Payer: Self-pay

## 2020-08-23 VITALS — BP 132/89 | HR 80

## 2020-08-23 DIAGNOSIS — I4811 Longstanding persistent atrial fibrillation: Secondary | ICD-10-CM

## 2020-08-23 LAB — POCT INR: INR: 2.2 (ref 2.0–3.0)

## 2020-08-24 ENCOUNTER — Ambulatory Visit (INDEPENDENT_AMBULATORY_CARE_PROVIDER_SITE_OTHER): Payer: Medicare HMO | Admitting: Family Medicine

## 2020-08-24 DIAGNOSIS — Z23 Encounter for immunization: Secondary | ICD-10-CM | POA: Diagnosis not present

## 2020-08-25 ENCOUNTER — Ambulatory Visit: Payer: Medicare HMO

## 2020-08-30 DIAGNOSIS — H02834 Dermatochalasis of left upper eyelid: Secondary | ICD-10-CM | POA: Diagnosis not present

## 2020-08-30 DIAGNOSIS — H02831 Dermatochalasis of right upper eyelid: Secondary | ICD-10-CM | POA: Diagnosis not present

## 2020-08-30 DIAGNOSIS — H02423 Myogenic ptosis of bilateral eyelids: Secondary | ICD-10-CM | POA: Diagnosis not present

## 2020-08-30 DIAGNOSIS — H57813 Brow ptosis, bilateral: Secondary | ICD-10-CM | POA: Diagnosis not present

## 2020-09-22 ENCOUNTER — Other Ambulatory Visit: Payer: Self-pay

## 2020-09-22 ENCOUNTER — Ambulatory Visit (INDEPENDENT_AMBULATORY_CARE_PROVIDER_SITE_OTHER): Payer: Medicare HMO | Admitting: Family Medicine

## 2020-09-22 VITALS — BP 144/85 | HR 84 | Wt 295.0 lb

## 2020-09-22 DIAGNOSIS — I4811 Longstanding persistent atrial fibrillation: Secondary | ICD-10-CM | POA: Diagnosis not present

## 2020-09-22 LAB — POCT INR
INR: 2.5 (ref 2.0–3.0)
INR: 2.5 (ref 2.0–3.0)

## 2020-09-22 MED ORDER — TRAMADOL HCL 50 MG PO TABS
50.0000 mg | ORAL_TABLET | Freq: Three times a day (TID) | ORAL | 0 refills | Status: DC | PRN
Start: 2020-09-22 — End: 2020-10-13

## 2020-09-22 NOTE — Progress Notes (Signed)
Patient's BP was taken prior to and after the procedure at patient's request.  Patient also requested a refill on his Tramadol.

## 2020-09-22 NOTE — Progress Notes (Signed)
Her blood pressure is elevated today.  Did send over refills for tramadol.  No change to Coumadin dosing.  Repeat in 4 weeks.

## 2020-10-06 ENCOUNTER — Other Ambulatory Visit: Payer: Self-pay | Admitting: Family Medicine

## 2020-10-06 DIAGNOSIS — I10 Essential (primary) hypertension: Secondary | ICD-10-CM

## 2020-10-13 ENCOUNTER — Other Ambulatory Visit: Payer: Self-pay | Admitting: *Deleted

## 2020-10-13 MED ORDER — TRAMADOL HCL 50 MG PO TABS
50.0000 mg | ORAL_TABLET | Freq: Three times a day (TID) | ORAL | 0 refills | Status: DC | PRN
Start: 2020-10-13 — End: 2021-07-31

## 2020-10-17 ENCOUNTER — Other Ambulatory Visit: Payer: Self-pay | Admitting: Family Medicine

## 2020-10-17 ENCOUNTER — Telehealth: Payer: Self-pay | Admitting: Family Medicine

## 2020-10-17 ENCOUNTER — Other Ambulatory Visit: Payer: Self-pay

## 2020-10-17 DIAGNOSIS — I482 Chronic atrial fibrillation, unspecified: Secondary | ICD-10-CM

## 2020-10-17 DIAGNOSIS — I10 Essential (primary) hypertension: Secondary | ICD-10-CM

## 2020-10-17 MED ORDER — WARFARIN SODIUM 5 MG PO TABS: 2.5000 mg | ORAL_TABLET | ORAL | 1 refills | Status: DC

## 2020-10-17 NOTE — Telephone Encounter (Signed)
Pt is Requesting his Lisinopril 40mg  tab be called into to [facility]

## 2020-10-18 ENCOUNTER — Other Ambulatory Visit: Payer: Self-pay | Admitting: *Deleted

## 2020-10-18 DIAGNOSIS — I10 Essential (primary) hypertension: Secondary | ICD-10-CM

## 2020-10-18 MED ORDER — LISINOPRIL 40 MG PO TABS: 40.0000 mg | ORAL_TABLET | Freq: Every day | ORAL | 1 refills | Status: DC

## 2020-10-18 NOTE — Progress Notes (Signed)
Refill sent.

## 2020-10-20 ENCOUNTER — Other Ambulatory Visit: Payer: Self-pay

## 2020-10-20 ENCOUNTER — Ambulatory Visit (INDEPENDENT_AMBULATORY_CARE_PROVIDER_SITE_OTHER): Payer: Medicare HMO | Admitting: Family Medicine

## 2020-10-20 VITALS — BP 140/69 | HR 85

## 2020-10-20 DIAGNOSIS — I4891 Unspecified atrial fibrillation: Secondary | ICD-10-CM | POA: Diagnosis not present

## 2020-10-20 LAB — POCT INR: INR: 2.8 (ref 2.0–3.0)

## 2020-10-20 NOTE — Progress Notes (Signed)
Return 4 weeks no change.  Did we recheck BP?   Beatrice Lecher, MD

## 2020-11-17 ENCOUNTER — Ambulatory Visit (INDEPENDENT_AMBULATORY_CARE_PROVIDER_SITE_OTHER): Payer: Medicare HMO | Admitting: Family Medicine

## 2020-11-17 ENCOUNTER — Other Ambulatory Visit: Payer: Self-pay

## 2020-11-17 VITALS — BP 139/81 | HR 78 | Wt 303.0 lb

## 2020-11-17 DIAGNOSIS — I4891 Unspecified atrial fibrillation: Secondary | ICD-10-CM

## 2020-11-17 DIAGNOSIS — I4811 Longstanding persistent atrial fibrillation: Secondary | ICD-10-CM

## 2020-11-17 LAB — POCT INR: INR: 3 (ref 2.0–3.0)

## 2020-11-17 NOTE — Progress Notes (Signed)
Pt here for INR check no missed doses, diet changes,bruising,bleeding,CP,SOB.  Pt advised to schedule a f/u with pcp for BP next month

## 2020-11-17 NOTE — Progress Notes (Signed)
Medical screening examination/treatment was performed by qualified clinical staff member and as supervising physician I was immediately available for consultation/collaboration. I have reviewed documentation and agree with assessment and plan. INR goal 2-3, continue current coumadin dosing.  Follow up with PCP for BP check.   Luetta Nutting, DO

## 2020-12-06 ENCOUNTER — Telehealth: Payer: Self-pay | Admitting: Family Medicine

## 2020-12-06 NOTE — Telephone Encounter (Signed)
Pt called and requested that Dr.Metheney's assistant - Tonya call him back because he has to have eye surgery and needs to talk to someone about coming off his Warfarin for a few days.

## 2020-12-06 NOTE — Telephone Encounter (Signed)
Called to pt to find out when his surgery date is. He informed me that it is 12/14/2020.  He is taking Warfarin and he also is taking a b12 supplement.  Will fwd to pcp for advice

## 2020-12-07 NOTE — Telephone Encounter (Signed)
Okay, please call the eye doctor's surgeon's office and see if they want [place] to hold his Coumadin I have no problem doing it but I just want to clarify that the patient is requesting what is actually being asked I usually prefer to receive some type of communication or fax from their office with the request information.  Maybe they have already tried to send something to our office am not sure?

## 2020-12-07 NOTE — Telephone Encounter (Signed)
Called patient to get surgeon information.  He's not at home, but will call me back once he gets home to give me the information.  He is having an eyelid lift.

## 2020-12-07 NOTE — Telephone Encounter (Signed)
Spoke with [facility].  Dr. Toni Arthurs would like Jerome Hicks to be off his coumadin for 5 days prior to surgery, his surgery date is 12/14/20.  She would like his PT/INR as close too 1 as possible.  If you don't agree with this please call Dr. Toni Arthurs at [phone].

## 2020-12-07 NOTE — Telephone Encounter (Signed)
Call pt: HOld coumadin for 5 days. Can restart the evening after surgery.  Plan to recheck coumadin/INR about 5 days after restarting his coumadin.

## 2020-12-07 NOTE — Telephone Encounter (Signed)
[facility] - Dr. Ralene Muskrat and the phone number is [phone].

## 2020-12-07 NOTE — Telephone Encounter (Signed)
Lets call the eye surgeon.  Typically we do not hold Coumadin for eye surgery.  But if they feel like it is necessary I can certainly make recommendations.  But lets call them directly to see what they are doing.

## 2020-12-09 NOTE — Telephone Encounter (Signed)
Patient advised.

## 2020-12-19 ENCOUNTER — Ambulatory Visit: Payer: Medicare HMO

## 2020-12-20 ENCOUNTER — Ambulatory Visit (INDEPENDENT_AMBULATORY_CARE_PROVIDER_SITE_OTHER): Payer: Medicare HMO | Admitting: Family Medicine

## 2020-12-20 ENCOUNTER — Other Ambulatory Visit: Payer: Self-pay

## 2020-12-20 ENCOUNTER — Encounter: Payer: Self-pay | Admitting: Family Medicine

## 2020-12-20 VITALS — BP 145/79 | HR 74 | Temp 98.4°F | Ht 74.0 in | Wt 299.0 lb

## 2020-12-20 DIAGNOSIS — R059 Cough, unspecified: Secondary | ICD-10-CM

## 2020-12-20 DIAGNOSIS — J61 Pneumoconiosis due to asbestos and other mineral fibers: Secondary | ICD-10-CM | POA: Diagnosis not present

## 2020-12-20 DIAGNOSIS — I10 Essential (primary) hypertension: Secondary | ICD-10-CM | POA: Diagnosis not present

## 2020-12-20 DIAGNOSIS — I4891 Unspecified atrial fibrillation: Secondary | ICD-10-CM | POA: Diagnosis not present

## 2020-12-20 DIAGNOSIS — J019 Acute sinusitis, unspecified: Secondary | ICD-10-CM | POA: Diagnosis not present

## 2020-12-20 LAB — POCT INR: INR: 2.4 (ref 2.0–3.0)

## 2020-12-20 MED ORDER — BENZONATATE 200 MG PO CAPS: 200.0000 mg | ORAL_CAPSULE | Freq: Three times a day (TID) | ORAL | 0 refills | Status: DC | PRN

## 2020-12-20 MED ORDER — MECLIZINE HCL 25 MG PO TABS: ORAL_TABLET | ORAL | 1 refills | Status: DC

## 2020-12-20 MED ORDER — SULFAMETHOXAZOLE-TRIMETHOPRIM 800-160 MG PO TABS: 1.0000 | ORAL_TABLET | Freq: Two times a day (BID) | ORAL | 0 refills | Status: DC

## 2020-12-20 MED ORDER — CARVEDILOL 6.25 MG PO TABS
6.2500 mg | ORAL_TABLET | Freq: Two times a day (BID) | ORAL | 0 refills | Status: DC
Start: 2020-12-20 — End: 2021-02-16

## 2020-12-20 NOTE — Assessment & Plan Note (Addendum)
INR looks great today.  Continue current regimen.  We will likely need to hold again in the next couple weeks when they reschedule his surgery.

## 2020-12-20 NOTE — Patient Instructions (Signed)
We will increase her carvedilol to 6.25 mg twice a day.  See you can take 2 tabs twice a day of your current carvedilol until you get your new prescription and from mail order.

## 2020-12-20 NOTE — Progress Notes (Signed)
Established Patient Office Visit  Subjective:  Patient ID: Jerome Hicks, male    DOB: [birth year]  Age: 70 y.o. MRN: [id number]  CC:  Chief Complaint  Patient presents with  . Coagulation Disorder  . Cough    HPI Jerome Hicks presents for f/u BP.  Reports his blood pressure has been elevated the last several times that he has been here.  He also reports that he has had some upper respiratory symptoms since last Tuesday, approximately 8 days ago.  Feels like postnasal drip he does not feel like he has a lot of phlegm or shortness of breath.  He says he really has not fevers chills or body aches.  Had the COVID-vaccine as well as the booster.  He says that his surgery got delayed because he was not feeling well.  So he did go ahead and restart his Coumadin on Tuesday which she had held up until that point.   Past Medical History:  Diagnosis Date  . Arthritis    oa  . 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 Ascending aortic aneurysm (HCC)    seen on 01-31-18 ct chest ; patient denies awareness  . Atrial fibrillation (HCC)   . Cataracts, bilateral   . ED (erectile dysfunction)    has been prescribed Levitra and Viagrain in the past  . Hypertension   . Internal hemorrhoids   . Kidney stones   . Leg edema    recurrent cellulitis  . Scrotal abscess 3-03  . Shoulder arthritis   . Sigmoid diverticulosis     Past Surgical History:  Procedure Laterality Date  .  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 CHOLECYSTECTOMY  1971  . kidney stones  1990's   Ureteroscopic stone extraction  . TONSILLECTOMY    . 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 Heart attack Mother [id number]  . Heart attack Father 2574  . Heart attack Sister 6538  . Heart attack Brother 43  . Multiple  sclerosis Daughter     Social History   Socioeconomic History  . Marital status: Widowed    Spouse name: Not on file  . Number of children: 2  . Years of education: 2014  . Highest education level: Associate degree: occupational, Scientist, product/process developmenttechnical, or vocational program  Occupational History  . Occupation: On disability     Comment: retired  Tobacco Use  . Smoking status: Never Smoker  . Smokeless tobacco: Current User    Types: Chew  Vaping Use  . Vaping Use: Never used  Substance and Sexual Activity  . Alcohol use: No  . Drug use: No  . Sexual activity: Not Currently  Other Topics Concern  . Not on file  Social History Narrative   Staying active   Splits wood   Social Determinants of Health   Financial Resource Strain: Not on file  Food Insecurity: Not on file  Transportation Needs: Not on file  Physical Activity: Not on file  Stress: Not on file  Social Connections: Not on file  Intimate Partner Violence: Not on file    Outpatient Medications Prior to Visit  Medication Sig Dispense Refill  . albuterol (PROVENTIL HFA;VENTOLIN HFA) 108 (90 Base) MCG/ACT inhaler Inhale 1-2 puffs into the lungs every 6 (six)  hours as needed for wheezing or shortness of breath. 1 Inhaler 2  . Carboxymethylcellul-Glycerin (LUBRICATING EYE DROPS OP) Place 1 drop into both eyes daily as needed (dry eyes).    . clotrimazole-betamethasone (LOTRISONE) cream Apply 1 application topically daily as needed (rash).     Marland Kitchen doxazosin (CARDURA) 4 MG tablet TAKE 1 TABLET EVERY DAY 90 tablet 1  . furosemide (LASIX) 40 MG tablet TAKE 1 TO 2 TABLETS DAILY AS NEEDED FOR EDEMA. 180 tablet 0  . lisinopril (ZESTRIL) 40 MG tablet Take 1 tablet (40 mg total) by mouth daily. 90 tablet 1  . Multiple Vitamin (MULTIVITAMIN WITH MINERALS) TABS tablet Take 1 tablet by mouth daily.    . potassium chloride (KLOR-CON) 10 MEQ tablet TAKE 1 TABLET EVERY DAY 90 tablet 1  . RESTASIS 0.05 % ophthalmic emulsion     . traMADol  (ULTRAM) 50 MG tablet Take 1 tablet (50 mg total) by mouth every 8 (eight) hours as needed. 90 tablet 0  . vitamin B-12 (CYANOCOBALAMIN) 1000 MCG tablet Take 1,000 mcg by mouth daily.    Marland Kitchen warfarin (COUMADIN) 5 MG tablet Take 0.5-1 tablets (2.5-5 mg total) by mouth See admin instructions. Take 5 mg daily on Monday and Thursday. Take 2.5 mg all other days. 180 tablet 1  . carvedilol (COREG) 3.125 MG tablet Take 1 tablet (3.125 mg total) by mouth 2 (two) times daily with a meal. 180 tablet 3  . meclizine (ANTIVERT) 25 MG tablet TAKE 1 TABLET THREE TIMES DAILY AS NEEDED  FOR  DIZZINESS  OR  NAUSEA  AS DIRECTED 90 tablet 1   No facility-administered medications prior to visit.    Allergies  Allergen Reactions  . Penicillins     Childhood allergy Has patient had a PCN reaction causing immediate rash, facial/tongue/throat swelling, SOB or lightheadedness with hypotension: Unknown Has patient had a PCN reaction causing severe rash involving mucus membranes or skin necrosis: Unknown Has patient had a PCN reaction that required hospitalization: Unknown Has patient had a PCN reaction occurring within the last 10 years: No If all of the above answers are "NO", then may proceed with Cephalosporin use.     ROS Review of Systems    Objective:    Physical Exam Constitutional:      Appearance: He is well-developed.  HENT:     Head: Normocephalic and atraumatic.     Right Ear: Tympanic membrane, ear canal and external ear normal.     Left Ear: Tympanic membrane, ear canal and external ear normal.     Nose: Nose normal.     Mouth/Throat:     Mouth: Mucous membranes are moist.     Pharynx: Oropharynx is clear. No posterior oropharyngeal erythema.  Eyes:     Conjunctiva/sclera: Conjunctivae normal.     Pupils: Pupils are equal, round, and reactive to light.  Neck:     Thyroid: No thyromegaly.  Cardiovascular:     Rate and Rhythm: Normal rate.     Heart sounds: Normal heart sounds.   Pulmonary:     Effort: Pulmonary effort is normal.     Breath sounds: Wheezing present.     Comments: Bilateral expiratory wheeze in both lungs.  Musculoskeletal:     Cervical back: Neck supple.  Lymphadenopathy:     Cervical: No cervical adenopathy.  Skin:    General: Skin is warm and dry.  Neurological:     Mental Status: He is alert and oriented to person, place, and time.  Psychiatric:  Mood and Affect: Mood normal.     BP (!) 145/79   Pulse 74   Temp 98.4 F (36.9 C)   Ht 6\' 2"  (1.88 m)   Wt 299 lb (135.6 kg)   SpO2 98%   BMI 38.39 kg/m  Wt Readings from Last 3 Encounters:  12/20/20 299 lb (135.6 kg)  11/17/20 (!) 303 lb (137.4 kg)  09/22/20 295 lb (133.8 kg)     Health Maintenance Due  Topic Date Due  . COVID-19 Vaccine (3 - Booster for Pfizer series) 09/04/2020    There are no preventive care reminders to display for this patient.  Lab Results  Component Value Date   TSH 3.18 04/30/2017   Lab Results  Component Value Date   WBC 6.4 06/23/2020   HGB 14.5 06/23/2020   HCT 42.7 06/23/2020   MCV 93.2 06/23/2020   PLT 211 06/23/2020   Lab Results  Component Value Date   NA 140 06/23/2020   K 4.8 06/23/2020   CO2 31 06/23/2020   GLUCOSE 114 (H) 06/23/2020   BUN 16 06/23/2020   CREATININE 0.92 06/23/2020   BILITOT 3.3 (H) 06/23/2020   ALKPHOS 68 04/30/2017   AST 23 06/23/2020   ALT 24 06/23/2020   PROT 6.4 06/23/2020   ALBUMIN 3.5 (L) 04/30/2017   CALCIUM 9.5 06/23/2020   ANIONGAP 6 08/02/2018   Lab Results  Component Value Date   CHOL 135 06/23/2020   Lab Results  Component Value Date   HDL 45 06/23/2020   Lab Results  Component Value Date   LDLCALC 75 06/23/2020   Lab Results  Component Value Date   TRIG 72 06/23/2020   Lab Results  Component Value Date   CHOLHDL 3.0 06/23/2020   Lab Results  Component Value Date   HGBA1C 4.9 10/06/2018      Assessment & Plan:   Problem List Items Addressed This Visit       Cardiovascular and Mediastinum   ESSENTIAL HYPERTENSION, BENIGN - Primary    Uncontrolled.  Discussed options he is already on the max dose of lisinopril.  He is also on carvedilol 3.25 mg.  We will increase carvedilol to 6.25 and have him follow-up in 2 weeks to recheck blood pressure.  We could always consider switching his lisinopril to valsartan as well.      Relevant Medications   carvedilol (COREG) 6.25 MG tablet   Atrial fibrillation (HCC)    INR looks great today.  Continue current regimen.  We will likely need to hold again in the next couple weeks when they reschedule his surgery.      Relevant Medications   carvedilol (COREG) 6.25 MG tablet   Other Relevant Orders   POCT INR (Completed)    Other Visit Diagnoses    Acute non-recurrent sinusitis, unspecified location       Relevant Medications   sulfamethoxazole-trimethoprim (BACTRIM DS) 800-160 MG tablet   benzonatate (TESSALON) 200 MG capsule   Other Relevant Orders   Novel Coronavirus, NAA ([facility])   Cough       Relevant Orders   Novel Coronavirus, NAA ([facility])      Acute sinusitis - sxs persistent for > 1 year.  We will go ahead and swab for COVID while he is here today.  Did discuss with him that he may be having more mild symptoms especially since he has been vaccinated.  We will also go ahead and treat for sinusitis since he feels like he is not really  getting any better with the nasal congestion.  Prescription sent to the pharmacy for Bactrim.  Meds ordered this encounter  Medications  . meclizine (ANTIVERT) 25 MG tablet    Sig: TAKE 1 TABLET THREE TIMES DAILY AS NEEDED  FOR  DIZZINESS  OR  NAUSEA  AS DIRECTED    Dispense:  90 tablet    Refill:  1  . carvedilol (COREG) 6.25 MG tablet    Sig: Take 1 tablet (6.25 mg total) by mouth 2 (two) times daily with a meal.    Dispense:  180 tablet    Refill:  0  . sulfamethoxazole-trimethoprim (BACTRIM DS) 800-160 MG tablet    Sig: Take 1 tablet by mouth 2 (two)  times daily.    Dispense:  14 tablet    Refill:  0  . benzonatate (TESSALON) 200 MG capsule    Sig: Take 1 capsule (200 mg total) by mouth 3 (three) times daily as needed for cough.    Dispense:  30 capsule    Refill:  0    Follow-up: Return in about 2 weeks (around 01/03/2021) for Nurse BP and INR check.    Beatrice Lecher, MD

## 2020-12-20 NOTE — Assessment & Plan Note (Addendum)
Uncontrolled.  Discussed options he is already on the max dose of lisinopril.  He is also on carvedilol 3.25 mg.  We will increase carvedilol to 6.25 and have him follow-up in 2 weeks to recheck blood pressure.  We could always consider switching his lisinopril to valsartan as well.

## 2020-12-20 NOTE — Progress Notes (Signed)
Pt reports that he was supposed to have eye surgery last Tuesday. This was cancelled because he informed the surgical center that he had a cough, and sore throat.  He denies f/s/c/n/v/d/body aches, headaches. He denies being around any sick contacts.  He has a cough and has been taking Coricidin HBP for cold/flu/sore throat. He said that whenever he coughs his throat gets irritated. He feels that he has a lot of head congestion.   He restarted the coumadin on last Tuesday. Before that he had been off of the coumadin for 4 days.

## 2020-12-22 LAB — NOVEL CORONAVIRUS, NAA: SARS-CoV-2, NAA: NOT DETECTED

## 2020-12-22 LAB — SARS-COV-2, NAA 2 DAY TAT

## 2020-12-26 ENCOUNTER — Ambulatory Visit: Payer: Medicare HMO | Admitting: Family Medicine

## 2021-01-03 ENCOUNTER — Ambulatory Visit (INDEPENDENT_AMBULATORY_CARE_PROVIDER_SITE_OTHER): Payer: Medicare HMO | Admitting: Family Medicine

## 2021-01-03 ENCOUNTER — Other Ambulatory Visit: Payer: Self-pay

## 2021-01-03 VITALS — BP 131/73 | HR 80

## 2021-01-03 DIAGNOSIS — I4891 Unspecified atrial fibrillation: Secondary | ICD-10-CM | POA: Diagnosis not present

## 2021-01-03 LAB — POCT INR: INR: 2.5 (ref 2.0–3.0)

## 2021-01-03 NOTE — Progress Notes (Signed)
[place] advised and scheduled.

## 2021-01-03 NOTE — Progress Notes (Signed)
See flow sheet

## 2021-01-31 ENCOUNTER — Ambulatory Visit (INDEPENDENT_AMBULATORY_CARE_PROVIDER_SITE_OTHER): Payer: Medicare HMO | Admitting: Family Medicine

## 2021-01-31 ENCOUNTER — Other Ambulatory Visit: Payer: Self-pay

## 2021-01-31 VITALS — BP 143/92 | HR 79 | Temp 97.8°F | Resp 20

## 2021-01-31 DIAGNOSIS — I4811 Longstanding persistent atrial fibrillation: Secondary | ICD-10-CM

## 2021-01-31 LAB — POCT INR: INR: 2.7 (ref 2.0–3.0)

## 2021-02-10 ENCOUNTER — Other Ambulatory Visit: Payer: Self-pay | Admitting: Family Medicine

## 2021-02-16 ENCOUNTER — Other Ambulatory Visit: Payer: Self-pay

## 2021-02-16 DIAGNOSIS — I10 Essential (primary) hypertension: Secondary | ICD-10-CM

## 2021-02-16 MED ORDER — CARVEDILOL 6.25 MG PO TABS
6.2500 mg | ORAL_TABLET | Freq: Two times a day (BID) | ORAL | 1 refills | Status: DC
Start: 2021-02-16 — End: 2021-06-13

## 2021-02-16 MED ORDER — DOXAZOSIN MESYLATE 4 MG PO TABS: 4.0000 mg | ORAL_TABLET | Freq: Every day | ORAL | 1 refills | Status: DC

## 2021-02-27 ENCOUNTER — Other Ambulatory Visit: Payer: Self-pay | Admitting: Family Medicine

## 2021-02-27 DIAGNOSIS — I10 Essential (primary) hypertension: Secondary | ICD-10-CM

## 2021-02-28 ENCOUNTER — Ambulatory Visit (INDEPENDENT_AMBULATORY_CARE_PROVIDER_SITE_OTHER): Payer: Medicare HMO | Admitting: Osteopathic Medicine

## 2021-02-28 ENCOUNTER — Other Ambulatory Visit: Payer: Self-pay

## 2021-02-28 VITALS — BP 129/86 | HR 75

## 2021-02-28 DIAGNOSIS — I4891 Unspecified atrial fibrillation: Secondary | ICD-10-CM

## 2021-02-28 LAB — POCT INR: INR: 2.5 (ref 2.0–3.0)

## 2021-03-02 IMAGING — DX DG KNEE 1-2V*L*
4 series · 4 of 4 positions shown · non-contrast
Comparison: None.

CLINICAL DATA: Right knee arthroplasty

EXAM:
LEFT KNEE - 1-2 VIEW

[knee ap]
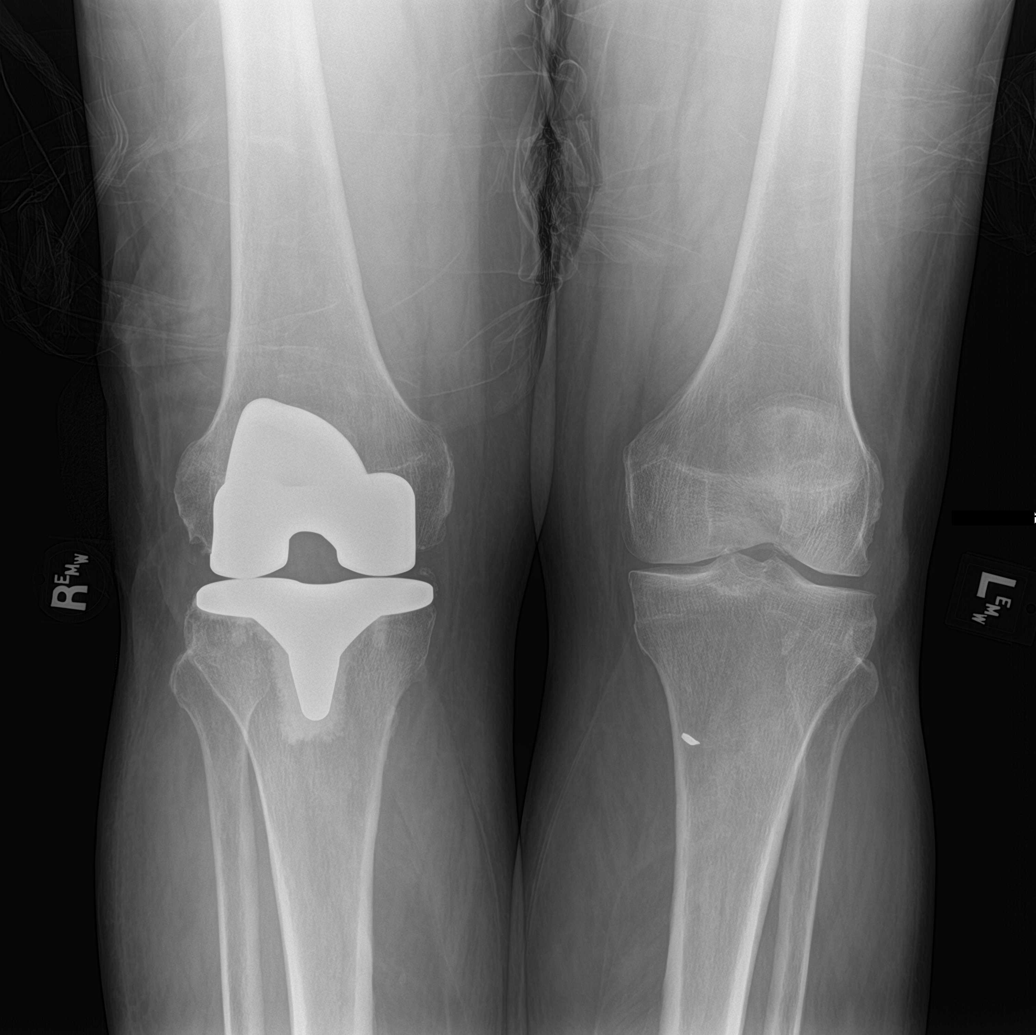

[tunnel]
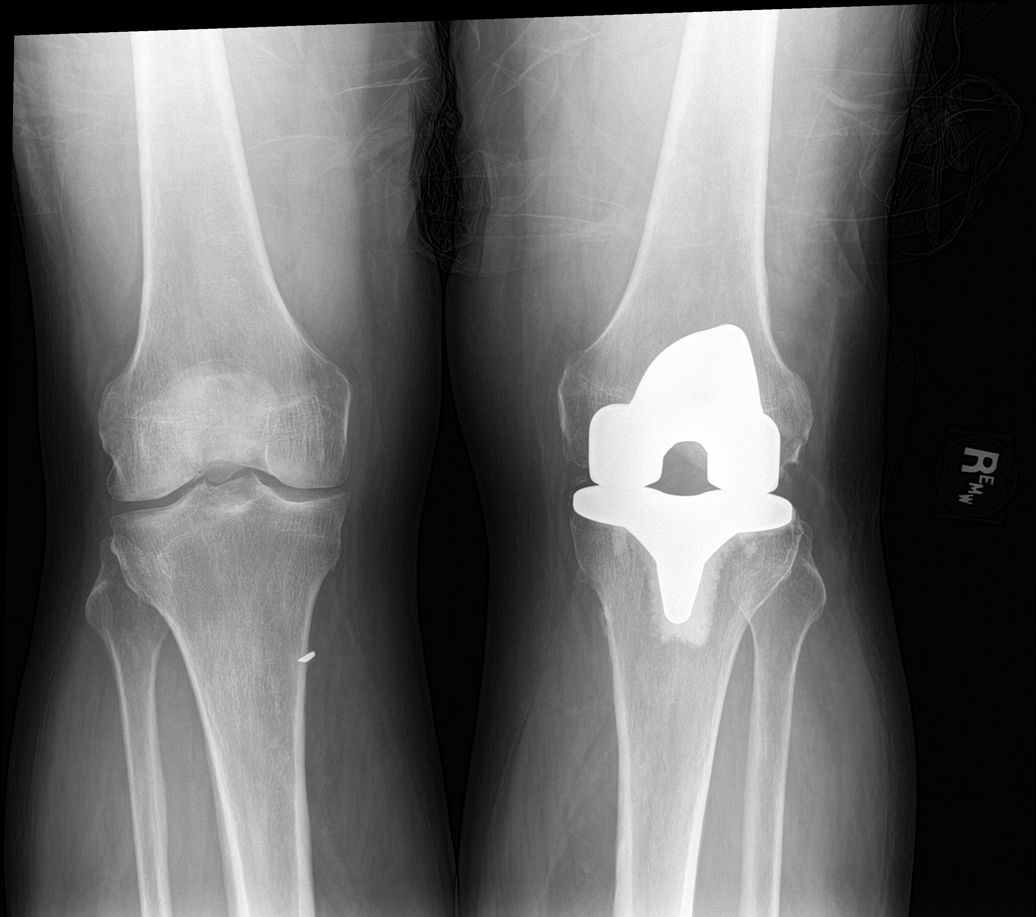

[knee lat]
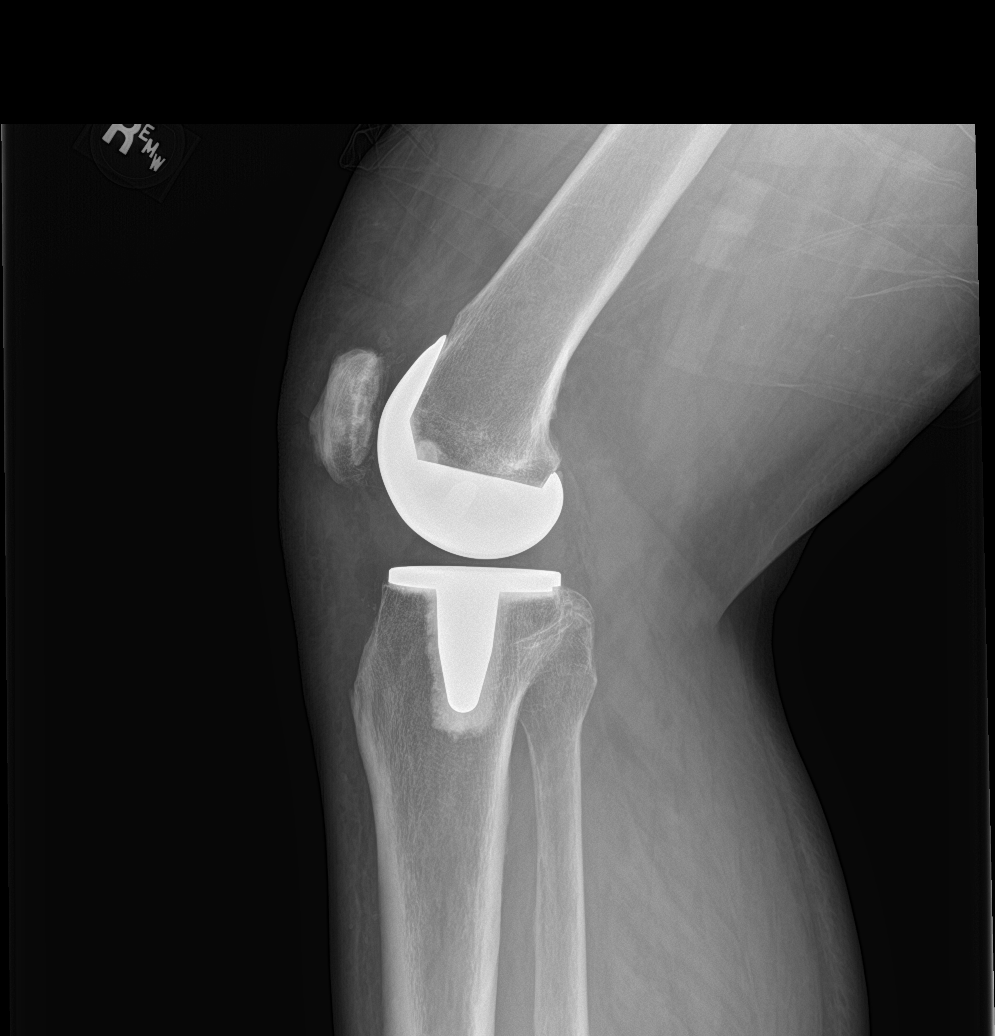

[knee sunrise]
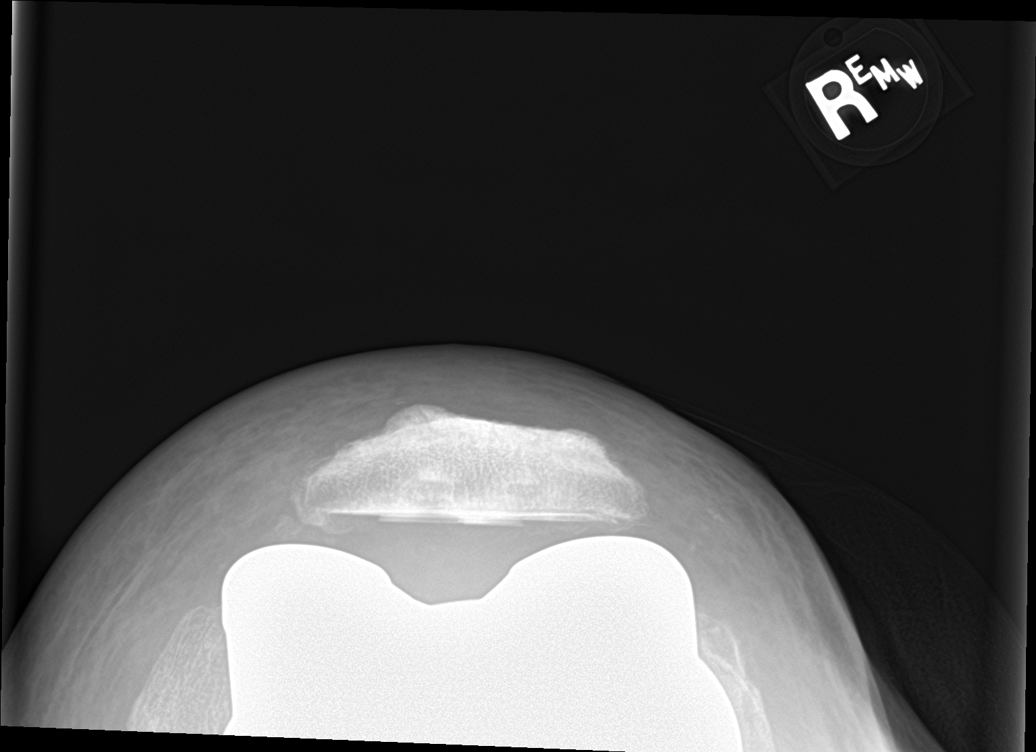

[4 of 4 positions shown; findings below may reference images not displayed]

FINDINGS: There is medial compartmental joint space narrowing. No evidence of
fracture. Metallic foreign body overlies the medial tibial diaphysis
and measures 8 mm.
IMPRESSION: 1. Medial compartment osteoarthritis.
2. Subcentimeter radiopaque foreign body of uncertain acuity.

## 2021-03-13 ENCOUNTER — Telehealth: Payer: Self-pay | Admitting: Family Medicine

## 2021-03-13 NOTE — Telephone Encounter (Signed)
Pt is having an eye lid lift and wanted to speak to [facility] about his coumadin. He was told he needed to be off of it for 5 days. He asked Tonya please call him when she has a minute

## 2021-03-14 NOTE — Telephone Encounter (Signed)
Called pt and advised that he can restart the evening after surgery.  Plan to recheck coumadin/INR about 5 days after restarting his coumadin. He has an appt on 4/26

## 2021-03-20 DIAGNOSIS — H02423 Myogenic ptosis of bilateral eyelids: Secondary | ICD-10-CM | POA: Diagnosis not present

## 2021-03-28 ENCOUNTER — Other Ambulatory Visit: Payer: Self-pay

## 2021-03-28 ENCOUNTER — Ambulatory Visit (INDEPENDENT_AMBULATORY_CARE_PROVIDER_SITE_OTHER): Payer: Medicare HMO | Admitting: Family Medicine

## 2021-03-28 VITALS — BP 133/84 | HR 74 | Temp 97.9°F | Resp 20 | Ht 74.0 in | Wt 299.0 lb

## 2021-03-28 DIAGNOSIS — I4811 Longstanding persistent atrial fibrillation: Secondary | ICD-10-CM

## 2021-03-28 DIAGNOSIS — I4891 Unspecified atrial fibrillation: Secondary | ICD-10-CM | POA: Diagnosis not present

## 2021-03-28 DIAGNOSIS — I482 Chronic atrial fibrillation, unspecified: Secondary | ICD-10-CM

## 2021-03-28 LAB — POCT INR: INR: 1.9 — AB (ref 2.0–3.0)

## 2021-03-28 NOTE — Progress Notes (Signed)
Jerome Hicks reports not taking his coumadin for 5 days due to eye lift lid surgery. Jerome Hicks's normal is 5mg  on Monday, Thursday.  2.5 mg all other days. Jerome Hicks will return on 04/03/21 for an INR check.

## 2021-03-28 NOTE — Progress Notes (Signed)
Agree with documentation as above.   Deania Siguenza, MD  

## 2021-04-03 ENCOUNTER — Ambulatory Visit: Payer: Medicare HMO

## 2021-04-03 ENCOUNTER — Ambulatory Visit (INDEPENDENT_AMBULATORY_CARE_PROVIDER_SITE_OTHER): Payer: Medicare HMO | Admitting: Family Medicine

## 2021-04-03 ENCOUNTER — Other Ambulatory Visit: Payer: Self-pay

## 2021-04-03 VITALS — BP 129/96 | HR 86

## 2021-04-03 DIAGNOSIS — I4891 Unspecified atrial fibrillation: Secondary | ICD-10-CM | POA: Diagnosis not present

## 2021-04-03 LAB — POCT INR: INR: 2.6 (ref 2.0–3.0)

## 2021-04-03 NOTE — Progress Notes (Signed)
Same dose.  Follow-up in 2 weeks.

## 2021-04-05 NOTE — Progress Notes (Signed)
Patient advised and scheduled.  

## 2021-04-06 ENCOUNTER — Other Ambulatory Visit: Payer: Self-pay | Admitting: Family Medicine

## 2021-04-06 ENCOUNTER — Telehealth (INDEPENDENT_AMBULATORY_CARE_PROVIDER_SITE_OTHER): Payer: Medicare HMO | Admitting: Family Medicine

## 2021-04-06 ENCOUNTER — Encounter: Payer: Self-pay | Admitting: Family Medicine

## 2021-04-06 VITALS — BP 122/72 | HR 76 | Temp 98.8°F | Ht 74.0 in | Wt 299.0 lb

## 2021-04-06 DIAGNOSIS — J019 Acute sinusitis, unspecified: Secondary | ICD-10-CM | POA: Diagnosis not present

## 2021-04-06 LAB — POCT INFLUENZA A/B
Influenza A, POC: NEGATIVE
Influenza B, POC: NEGATIVE

## 2021-04-06 NOTE — Progress Notes (Signed)
    Subjective:    CC: 5 days of nasal congestion.  He says he has a lot of pressure in the back of his head especially if he coughs or sneezes.  He says he just has a mild intermittent cough.  Nonproductive he is also not really getting a lot of congestion out of his nose is mostly pressure.  No fevers chills or sweats.  No ear pain.  No GI symptoms.  Denies any body aches. No OTC medications.     Past medical history, Surgical history, Family history not pertinant except as noted below, Social history, Allergies, and medications have been entered into the medical record, reviewed, and corrections made.   Review of Systems: No fevers, chills, night sweats, weight loss, chest pain, or shortness of breath.   Objective:    Physical Exam Constitutional:      Appearance: He is well-developed.  HENT:     Head: Normocephalic and atraumatic.     Right Ear: External ear normal.     Left Ear: External ear normal.     Nose: Nose normal.  Eyes:     Conjunctiva/sclera: Conjunctivae normal.     Pupils: Pupils are equal, round, and reactive to light.  Neck:     Thyroid: No thyromegaly.  Cardiovascular:     Rate and Rhythm: Normal rate.     Heart sounds: Normal heart sounds.  Pulmonary:     Effort: Pulmonary effort is normal.     Breath sounds: Normal breath sounds.  Musculoskeletal:     Cervical back: Neck supple.  Lymphadenopathy:     Cervical: No cervical adenopathy.  Skin:    General: Skin is warm and dry.  Neurological:     Mental Status: He is alert and oriented to person, place, and time.       Impression and Recommendations:    No problem-specific Assessment & Plan notes found for this encounter.  URI - tested for COVID and for flu.  Likely viral. If not better in the next couple of days ok to fill rx for the antibiotic.     Beatrice Lecher, MD

## 2021-04-07 ENCOUNTER — Telehealth: Payer: Self-pay | Admitting: *Deleted

## 2021-04-07 LAB — NOVEL CORONAVIRUS, NAA: SARS-CoV-2, NAA: DETECTED — AB

## 2021-04-07 LAB — SARS-COV-2, NAA 2 DAY TAT

## 2021-04-07 NOTE — Telephone Encounter (Addendum)
Called and informed pt of + COVID test. And that he will need to stay quarantined for 10 days from onset of sxs.   He wanted to know since he cant get the ABX if there is anything he can take.

## 2021-04-07 NOTE — Telephone Encounter (Signed)
LVM informing pt to stay quarantined until 5/11.  Advised that he can take OTC cough and cold meds,stay hydrated, continue to do proper hand hygiene and wipe surfaces.  Advised that if he felt that his sxs were getting worse (elevated temperature,body aches, n/v)to seek care at the ED.

## 2021-04-08 ENCOUNTER — Telehealth: Payer: Self-pay | Admitting: Adult Health

## 2021-04-08 NOTE — Telephone Encounter (Signed)
Patient meets criteria for COVID 19 treatment.  Unfortunately, he is outside the window to receive oral therapy (that was yesterday), and on Monday, when our infusion center opens, he will be past the 7 day window to receive IV therapy.  I reviewed this with him and recommended that he continue to f/u with Dr. Madilyn Fireman, and he notes he plans to do so.    Wilber Bihari, NP

## 2021-04-17 ENCOUNTER — Telehealth: Payer: Self-pay | Admitting: Family Medicine

## 2021-04-17 NOTE — Chronic Care Management (AMB) (Signed)
  Chronic Care Management   Note  04/17/2021 Name: Jerome Hicks MRN: [id number] DOB: 1951/08/29  Jerome Hicks is a 70 y.o. year old male who is a primary care patient of Hali Marry, MD. I reached out to Calvert Cantor by phone today in response to a referral sent by Jerome Hicks, Hali Marry, MD.   Jerome Hicks was given information about Chronic Care Management services today including:  1. CCM service includes personalized support from designated clinical staff supervised by his physician, including individualized plan of care and coordination with other care providers 2. 24/7 contact phone numbers for assistance for urgent and routine care needs. 3. Service will only be billed when office clinical staff spend 20 minutes or more in a month to coordinate care. 4. Only one practitioner may furnish and bill the service in a calendar month. 5. The patient may stop CCM services at any time (effective at the end of the month) by phone call to the office staff.   Patient agreed to services and verbal consent obtained.   Follow up plan:   Lauretta Grill [employer] Scheduler

## 2021-04-18 ENCOUNTER — Ambulatory Visit (INDEPENDENT_AMBULATORY_CARE_PROVIDER_SITE_OTHER): Payer: Medicare HMO | Admitting: Family Medicine

## 2021-04-18 ENCOUNTER — Other Ambulatory Visit: Payer: Self-pay

## 2021-04-18 VITALS — BP 133/85 | HR 83

## 2021-04-18 DIAGNOSIS — I4891 Unspecified atrial fibrillation: Secondary | ICD-10-CM | POA: Diagnosis not present

## 2021-04-18 LAB — POCT INR: INR: 2.9 (ref 2.0–3.0)

## 2021-04-18 NOTE — Progress Notes (Signed)
Tito advised and scheduled.

## 2021-04-18 NOTE — Progress Notes (Signed)
Needs 2 week f/u.

## 2021-04-19 ENCOUNTER — Ambulatory Visit: Payer: Medicare HMO

## 2021-04-25 NOTE — Progress Notes (Signed)
This encounter was created in error - please disregard.

## 2021-04-26 ENCOUNTER — Telehealth: Payer: Self-pay | Admitting: Pharmacist

## 2021-04-26 NOTE — Telephone Encounter (Signed)
Patient no-showed today's appointment for face-to-face initial visit of chronic care management services by pharmacist.  Routing to schedule team for reschedule.  Jerome Hicks

## 2021-04-26 NOTE — Telephone Encounter (Signed)
Patient has been cancelled off schedule for today. AM

## 2021-04-27 ENCOUNTER — Ambulatory Visit (INDEPENDENT_AMBULATORY_CARE_PROVIDER_SITE_OTHER): Payer: Medicare HMO | Admitting: Pharmacist

## 2021-04-27 ENCOUNTER — Other Ambulatory Visit: Payer: Self-pay

## 2021-04-27 DIAGNOSIS — I4891 Unspecified atrial fibrillation: Secondary | ICD-10-CM | POA: Diagnosis not present

## 2021-04-27 DIAGNOSIS — I10 Essential (primary) hypertension: Secondary | ICD-10-CM | POA: Diagnosis not present

## 2021-04-27 DIAGNOSIS — N1831 Chronic kidney disease, stage 3a: Secondary | ICD-10-CM | POA: Diagnosis not present

## 2021-04-27 NOTE — Patient Instructions (Signed)
Visit Information   PATIENT GOALS:  Goals Addressed            This Visit's Progress   . Medication Management       Patient Goals/Self-Care Activities . Over the next 180 days, patient will:  take medications as prescribed and engage in dietary modifications by portion control & incorporating more exercise (patient-stated goal of weight loss)  Follow Up Plan: Face to Face appointment with care management team member scheduled for: 6 months        Consent to CCM Services: Jerome Hicks was given information about Chronic Care Management services today including:  1. CCM service includes personalized support from designated clinical staff supervised by his physician, including individualized plan of care and coordination with other care providers 2. 24/7 contact phone numbers for assistance for urgent and routine care needs. 3. Service will only be billed when office clinical staff spend 20 minutes or more in a month to coordinate care. 4. Only one practitioner may furnish and bill the service in a calendar month. 5. The patient may stop CCM services at any time (effective at the end of the month) by phone call to the office staff. 6. The patient will be responsible for cost sharing (co-pay) of up to 20% of the service fee (after annual deductible is met).  Patient agreed to services and verbal consent obtained.   The patient verbalized understanding of instructions, educational materials, and care plan provided today and agreed to receive a mailed copy of patient instructions, educational materials, and care plan.   Face to Face appointment with care management team member scheduled for:  6 months  [place] CARE PLAN: Patient Care Plan: Medication Management    Problem Identified: Afib, HTN, CKD3     Long-Range Goal: Disease Progression Prevention   Note:   Current Barriers:  . none at present  Pharmacist Clinical Goal(s):  Marland Kitchen Over the next 90 days,  patient will maintain control of BP, Afib as evidenced by home BP/HR readings, taking medication as prescribed  through collaboration with PharmD and provider.   Interventions: . 1:1 collaboration with Hali Marry, MD regarding development and update of comprehensive plan of care as evidenced by provider attestation and co-signature . Inter-disciplinary care team collaboration (see longitudinal plan of care) . Comprehensive medication review performed; medication list updated in electronic medical record  Hypertension: . Controlled; current treatment: carvedilol 6.18m BID, doxazosin 433mdaily, lisinopril 4076maily, furosemide 45m61mily;  . Current home readings: about 128-135/ 72-83, checks once daily  . Denies/reports hypotensive/hypertensive symptoms . Recommended continue current medications,  Atrial Fibrillation: . Controlled; current rate/rhythm control carvedilol; anticoagulant treatment: warfarin . CHADS2VASc score: 3 (3.2% annual stroke risk . Home blood pressure, heart rate readings:  . Educated on stroke risk, HR control (through carvedilol), and s/sx of bleeding, though patient does very well on time in therapeutic range (TITR) Recommended continue current medications, and  CKD Stage 3 . Medications evaluated for renal function safety/efficacy . No concerns, continue current regimen  Patient Goals/Self-Care Activities . Over the next 180 days, patient will:  take medications as prescribed and engage in dietary modifications by portion control & incorporating more exercise (patient-stated goal of weight loss)  Follow Up Plan: Face to Face appointment with care management team member scheduled for: 6 months

## 2021-04-27 NOTE — Progress Notes (Signed)
Chronic Care Management Pharmacy Note  04/27/2021 Name:  Jerome Hicks MRN:  [id number] DOB:  [birth year]  Subjective: Jerome Hicks is an 70 y.o. year old male who is a primary patient of Metheney, Rene Kocher, MD.  The CCM team was consulted for assistance with disease management and care coordination needs.    Engaged with patient face to face for initial visit in response to provider referral for pharmacy case management and/or care coordination services.   Consent to Services:  The patient was given information about Chronic Care Management services, agreed to services, and gave verbal consent prior to initiation of services.  Please see initial visit note for detailed documentation.   Patient Care Team: Hali Marry, MD as PCP - General Darius Bump, [facility] - [place] as Pharmacist (Pharmacist)  Recent office visits: 04/18/21 - Dr. Madilyn Fireman (PCP)  - anticoagulation visit  Recent consult visits:   Hospital visits: None in previous 6 months  Objective:  Lab Results  Component Value Date   CREATININE 0.92 06/23/2020   CREATININE 0.85 01/07/2020   CREATININE 0.85 12/31/2019    Lab Results  Component Value Date   HGBA1C 4.9 10/06/2018   Last diabetic Eye exam: No results found for: HMDIABEYEEXA  Last diabetic Foot exam: No results found for: HMDIABFOOTEX      Component Value Date/Time   CHOL 135 06/23/2020 1029   TRIG 72 06/23/2020 1029   HDL 45 06/23/2020 1029   CHOLHDL 3.0 06/23/2020 1029   VLDL 9 07/01/2015 1429   LDLCALC 75 06/23/2020 1029    Hepatic Function Latest Ref Rng & Units 06/23/2020 01/07/2020 04/06/2019  Total Protein 6.1 - 8.1 g/dL 6.4 6.2 6.5  Albumin 3.6 - 5.1 g/dL - - -  AST 10 - 35 U/L _0 ALT 9 - 46 U/L _1 Alk Phosphatase 40 - 115 U/L - - -  Total Bilirubin 0.2 - 1.2 mg/dL 3.3(H) 3.0(H) 2.0(H)  Bilirubin, Direct 0.0 - 0.3 mg/dL - - -    Lab Results  Component Value Date/Time   TSH 3.18 04/30/2017 08:43 AM   TSH 2.90  08/28/2016 10:17 AM    CBC Latest Ref Rng & Units 06/23/2020 01/07/2020 08/02/2018  WBC 3.8 - 10.8 Thousand/uL 6.4 6.3 13.1(H)  Hemoglobin 13.2 - 17.1 g/dL 14.5 14.0 12.1(L)  Hematocrit 38.5 - 50.0 % 42.7 41.5 35.5(L)  Platelets 140 - 400 Thousand/uL 211 194 167    No results found for: VD25OH  Clinical ASCVD: Yes  The 10-year ASCVD risk score Mikey Bussing DC Jr., et al., 2013) is: 20.8%   Values used to calculate the score:     Age: 70 years     Sex: Male     Is Non-Hispanic African American: No     Diabetic: No     Tobacco smoker: No     Systolic Blood Pressure: 782 mmHg     Is BP treated: Yes     HDL Cholesterol: 45 mg/dL     Total Cholesterol: 135 mg/dL     Social History   Tobacco Use  Smoking Status Never Smoker  Smokeless Tobacco Current User  . Types: Chew   BP Readings from Last 3 Encounters:  04/18/21 133/85  04/06/21 122/72  04/03/21 (!) 129/96   Pulse Readings from Last 3 Encounters:  04/18/21 83  04/06/21 76  04/03/21 86   Wt Readings from Last 3 Encounters:  04/06/21 299 lb (135.6 kg)  03/28/21 299 lb (135.6 kg)  12/20/20 299 lb (135.6 kg)    Assessment: Review of patient past medical history, allergies, medications, health status, including review of consultants reports, laboratory and other test data, was performed as part of comprehensive evaluation and provision of chronic care management services.   SDOH:  (Social Determinants of Health) assessments and interventions performed:  SDOH Interventions   Flowsheet Row Most Recent Value  SDOH Interventions   Financial Strain Interventions Intervention Not Indicated      CCM Care Plan  Allergies  Allergen Reactions  . Penicillins     Childhood allergy Has patient had a PCN reaction causing immediate rash, facial/tongue/throat swelling, SOB or lightheadedness with hypotension: Unknown Has patient had a PCN reaction causing severe rash involving mucus membranes or skin necrosis: Unknown Has patient  had a PCN reaction that required hospitalization: Unknown Has patient had a PCN reaction occurring within the last 10 years: No If all of the above answers are "NO", then may proceed with Cephalosporin use.     Medications Reviewed Today    Reviewed by Narda Rutherford, CMA (Certified Medical Assistant) on 04/18/21 at [phone]  Med List Status: <None>  Medication Order Taking? Sig Documenting Provider Last Dose Status Informant  Carboxymethylcellul-Glycerin (LUBRICATING EYE DROPS OP) [id number] Yes Place 1 drop into both eyes daily as needed (dry eyes). [provider] Taking Active Self  carvedilol (COREG) 6.25 MG tablet [id number] Yes Take 1 tablet (6.25 mg total) by mouth 2 (two) times daily with a meal. Hali Marry, MD Taking Active   clotrimazole-betamethasone (LOTRISONE) cream [id number] Yes Apply 1 application topically daily as needed (rash).  [provider] Taking Active Self  doxazosin (CARDURA) 4 MG tablet [id number] Yes Take 1 tablet (4 mg total) by mouth daily. Hali Marry, MD Taking Active   furosemide (LASIX) 40 MG tablet [id number] Yes TAKE 1 TO 2 TABLETS DAILY AS NEEDED FOR EDEMA. Hali Marry, MD Taking Active   lisinopril (ZESTRIL) 40 MG tablet [id number] Yes TAKE 1 TABLET EVERY DAY Hali Marry, MD Taking Active   meclizine (ANTIVERT) 25 MG tablet [id number] Yes TAKE 1 TABLET THREE TIMES DAILY AS NEEDED  FOR  DIZZINESS  OR  NAUSEA  AS DIRECTED Hali Marry, MD Taking Active   Multiple Vitamin (MULTIVITAMIN WITH MINERALS) TABS tablet [id number] Yes Take 1 tablet by mouth daily. [provider] Taking Active Self  potassium chloride (KLOR-CON) 10 MEQ tablet [id number] Yes TAKE 1 TABLET EVERY DAY Hali Marry, MD Taking Active   RESTASIS 0.05 % ophthalmic emulsion [id number] Yes  [provider] Taking Active   traMADol (ULTRAM) 50 MG tablet [id number] Yes Take 1 tablet (50 mg total) by mouth every 8  (eight) hours as needed. Hali Marry, MD Taking Active   vitamin B-12 (CYANOCOBALAMIN) 1000 MCG tablet [id number] Yes Take 1,000 mcg by mouth daily. [provider] Taking Active Self  warfarin (COUMADIN) 5 MG tablet [id number] Yes Take 0.5-1 tablets (2.5-5 mg total) by mouth See admin instructions. Take 5 mg daily on Monday and Thursday. Take 2.5 mg all other days. Hali Marry, MD Taking Active           Patient Active Problem List   Diagnosis Date Noted  . Coronary artery disease due to calcified coronary lesion 03/10/2019  . Total knee replacement status 08/02/2018  . 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 History of arthroplasty of right knee 10/21/2017  . BMI 37.0-37.9, adult 04/27/2016  . Glenohumeral arthritis of both  shoulders 04/14/2015  . 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 Hypogonadism male 02/26/2014  . Lower leg edema 12/02/2013  . Lymph node enlargement 12/02/2013  . CKD (chronic kidney disease) stage 3, GFR 30-59 ml/min (HCC) 05/28/2013  . Atrial fibrillation ([place]) 06/25/2012  . 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 Restrictive lung disease 01/12/2011  . ERECTILE DYSFUNCTION 01/06/2008  . ESSENTIAL HYPERTENSION, BENIGN 01/05/2008  . Venous (peripheral) insufficiency 01/05/2008    Immunization History  Administered Date(s) Administered  . Fluad Quad(high Dose 65+) 08/24/2020  . Influenza Split 08/21/2012  . Influenza Whole 08/27/2008, 11/07/2009, 10/18/2010, 08/20/2011  . Influenza, High Dose Seasonal PF 08/26/2017, 08/08/2018, 08/03/2019  . Influenza,inj,Quad PF,6+ Mos 08/25/2013, 08/05/2014, 08/31/2015, 08/28/2016  . PFIZER(Purple Top)SARS-COV-2 Vaccination 02/12/2020, 03/05/2020  . Pneumococcal Conjugate-13 09/29/2013  . Pneumococcal Polysaccharide-23 07/14/2015  . Td 12/03/1994, 06/15/2009  . Zoster 06/18/2011  . Zoster Recombinat (Shingrix) 06/07/2018, 08/21/2018    Conditions to be addressed/monitored: Atrial Fibrillation, HTN and  CKD Stage 3  Care Plan : Medication Management  Updates made by Darius Bump, [place] since 04/27/2021 12:00 AM    Problem: Afib, HTN, CKD3     Long-Range Goal: Disease Progression Prevention   Note:   Current Barriers:  . none at present  Pharmacist Clinical Goal(s):  Marland Kitchen Over the next 90 days, patient will maintain control of BP, Afib as evidenced by home BP/HR readings, taking medication as prescribed  through collaboration with PharmD and provider.   Interventions: . 1:1 collaboration with Hali Marry, MD regarding development and update of comprehensive plan of care as evidenced by provider attestation and co-signature . Inter-disciplinary care team collaboration (see longitudinal plan of care) . Comprehensive medication review performed; medication list updated in electronic medical record  Hypertension: . Controlled; current treatment: carvedilol 6.73m BID, doxazosin 458mdaily, lisinopril 4053maily, furosemide 62m44mily;  . Current home readings: about 128-135/ 72-83, checks once daily  . Denies/reports hypotensive/hypertensive symptoms . Recommended continue current medications,  Atrial Fibrillation: . Controlled; current rate/rhythm control carvedilol; anticoagulant treatment: warfarin . CHADS2VASc score: 3 (3.2% annual stroke risk) . Educated on stroke risk, HR control (through carvedilol), and s/sx of bleeding, though patient does very well on time in therapeutic range (TITR) Recommended continue current medications, and  CKD Stage 3 . Medications evaluated for renal function safety/efficacy . No concerns, continue current regimen  Patient Goals/Self-Care Activities . Over the next 180 days, patient will:  take medications as prescribed and engage in dietary modifications by portion control & incorporating more exercise (patient-stated goal of weight loss)  Follow Up Plan: Face to Face appointment with care management team member scheduled for: 6 months       Medication Assistance: None required.  Patient affirms current coverage meets needs.  [facility]:  [facility] [id number]: [phone]: [id number]: [phone]: [phone] pill box? Yes Pt endorses 100% compliance  Follow Up:  Patient agrees to Care Plan and Follow-up.  Plan: Face to Face appointment with care management team member scheduled for: 6 months  KeesDarius Bump

## 2021-05-02 ENCOUNTER — Ambulatory Visit (INDEPENDENT_AMBULATORY_CARE_PROVIDER_SITE_OTHER): Payer: Medicare HMO | Admitting: Family Medicine

## 2021-05-02 ENCOUNTER — Other Ambulatory Visit: Payer: Self-pay

## 2021-05-02 VITALS — BP 140/78 | HR 66

## 2021-05-02 DIAGNOSIS — I4891 Unspecified atrial fibrillation: Secondary | ICD-10-CM

## 2021-05-02 LAB — POCT INR: INR: 2.5 (ref 2.0–3.0)

## 2021-05-02 NOTE — Progress Notes (Signed)
Patient advised and scheduled.  

## 2021-05-23 ENCOUNTER — Ambulatory Visit (INDEPENDENT_AMBULATORY_CARE_PROVIDER_SITE_OTHER): Payer: Medicare HMO | Admitting: Sports Medicine

## 2021-05-23 VITALS — BP 140/81 | HR 66

## 2021-05-23 DIAGNOSIS — I4891 Unspecified atrial fibrillation: Secondary | ICD-10-CM | POA: Diagnosis not present

## 2021-05-23 LAB — POCT INR: INR: 2 (ref 2.0–3.0)

## 2021-05-23 NOTE — Progress Notes (Signed)
Patient advised and scheduled.  

## 2021-05-23 NOTE — Assessment & Plan Note (Signed)
INR therapeutic today, continue current regimen, recheck in 3 weeks.

## 2021-05-23 NOTE — Progress Notes (Signed)
    Procedures performed today:    None.  Independent interpretation of notes and tests performed by another provider:   None.  Brief History, Exam, Impression, and Recommendations:    Atrial fibrillation INR therapeutic today, continue current regimen, recheck in 3 weeks.    ___________________________________________ Gwen Her. Dianah Field, M.D., ABFM., CAQSM. Primary Care and [place] Instructor of [employer] [facility] [employer]

## 2021-06-13 ENCOUNTER — Other Ambulatory Visit: Payer: Self-pay

## 2021-06-13 ENCOUNTER — Ambulatory Visit (INDEPENDENT_AMBULATORY_CARE_PROVIDER_SITE_OTHER): Payer: Medicare HMO | Admitting: Family Medicine

## 2021-06-13 ENCOUNTER — Encounter: Payer: Self-pay | Admitting: Family Medicine

## 2021-06-13 VITALS — BP 130/84 | HR 75 | Ht 74.0 in | Wt 302.0 lb

## 2021-06-13 DIAGNOSIS — I10 Essential (primary) hypertension: Secondary | ICD-10-CM

## 2021-06-13 DIAGNOSIS — Z125 Encounter for screening for malignant neoplasm of prostate: Secondary | ICD-10-CM

## 2021-06-13 DIAGNOSIS — Z0000 Encounter for general adult medical examination without abnormal findings: Secondary | ICD-10-CM | POA: Diagnosis not present

## 2021-06-13 DIAGNOSIS — E7889 Other lipoprotein metabolism disorders: Secondary | ICD-10-CM | POA: Diagnosis not present

## 2021-06-13 DIAGNOSIS — I4891 Unspecified atrial fibrillation: Secondary | ICD-10-CM | POA: Diagnosis not present

## 2021-06-13 LAB — POCT INR: INR: 2.5 (ref 2.0–3.0)

## 2021-06-13 MED ORDER — CARVEDILOL 12.5 MG PO TABS: 12.5000 mg | ORAL_TABLET | Freq: Two times a day (BID) | ORAL | 1 refills | Status: DC

## 2021-06-13 NOTE — Progress Notes (Signed)
INR checked today at office visit. No missed doses, diet changes,bruising,bleeding,CP,SOB. His previous coumadin doses were as follows:  2.5 mg on M,Thursdays, 5 mg all other days.

## 2021-06-13 NOTE — Progress Notes (Signed)
CPE  Established Patient Office Visit  Subjective:  Patient ID: Jerome Hicks, male    DOB: 08-Jan-1951  Age: 70 y.o. MRN: [id number]  CC:  Chief Complaint  Patient presents with   Annual Exam   Coagulation Disorder    HPI Jerome Hicks presents for CPE.  He reports that he is doing well overall.  No recent chest pain he does still struggle with trouble shortness of breath but he does come due for his yearly PFTs at the end of this month.  He does have a history of asbestosis exposure and he knows that his weight contributes to shortness of breath as well in fact more recently he has been trying to lose weight he says he started purposely cutting back on sugary foods and beverages about a week ago and has already dropped 5 pounds.  He did have his eyelid surgery done and has tolerated that well and has had a good recovery.  He feels like he sleeps well.  He notes that his blood pressures have been a little bit more elevated the last couple times that he has been here  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)    get yearly PFT's   Ascending aortic aneurysm ([place])    seen on 01-31-18 ct chest ; patient denies awareness   Atrial fibrillation ([place])    Cataracts, bilateral    ED (erectile dysfunction)    has been prescribed Levitra and Viagrain in the past   Hypertension    Internal hemorrhoids    Kidney stones    Leg edema    recurrent cellulitis   Scrotal abscess 3-03   Shoulder arthritis    Sigmoid diverticulosis     Past Surgical History:  Procedure Laterality Date    MRSA infection in left groin requiring surgical debridement  12-08   CHOLECYSTECTOMY  1971   kidney stones  1990's   Ureteroscopic stone extraction   TONSILLECTOMY     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   Procedure: RIGHT TOTAL KNEE ARTHROPLASTY;  Surgeon: Dorna Leitz, MD;  Location: [facility] ORS;  Service: Orthopedics;  Laterality: Right;    Family History  Problem  Relation Age of Onset   Heart attack Mother 65   Heart attack Father 58   Heart attack Sister 62   Heart attack Brother 8   Multiple sclerosis Daughter     Social History   Socioeconomic History   Marital status: Widowed    Spouse name: Not on file   Number of children: 2   Years of education: 14   Highest education level: Associate degree: occupational, Hotel manager, or vocational program  Occupational History   Occupation: On disability     Comment: retired  Tobacco Use   Smoking status: Never   Smokeless tobacco: Current    Types: Chew  Vaping Use   Vaping Use: Never used  Substance and Sexual Activity   Alcohol use: No   Drug use: No   Sexual activity: Not Currently  Other Topics Concern   Not on file  Social History Narrative   Staying active   Splits wood   Social Determinants of Health   Financial Resource Strain: Low Risk    Difficulty of Paying Living Expenses: Not very hard  Food Insecurity: Not on file  Transportation Needs: Not on file  Physical Activity: Not on file  Stress: Not on file  Social Connections: Not on file  Intimate Partner Violence: Not on file    Outpatient Medications Prior to Visit  Medication Sig Dispense Refill   Carboxymethylcellul-Glycerin (LUBRICATING EYE DROPS OP) Place 1 drop into both eyes daily as needed (dry eyes).     clotrimazole-betamethasone (LOTRISONE) cream Apply 1 application topically daily as needed (rash).      doxazosin (CARDURA) 4 MG tablet Take 1 tablet (4 mg total) by mouth daily. 90 tablet 1   furosemide (LASIX) 40 MG tablet TAKE 1 TO 2 TABLETS DAILY AS NEEDED FOR EDEMA. 180 tablet 0   lisinopril (ZESTRIL) 40 MG tablet TAKE 1 TABLET EVERY DAY 90 tablet 1   meclizine (ANTIVERT) 25 MG tablet TAKE 1 TABLET THREE TIMES DAILY AS NEEDED  FOR  DIZZINESS  OR  NAUSEA  AS DIRECTED 90 tablet 1   Multiple Vitamin (MULTIVITAMIN WITH MINERALS) TABS tablet Take 1 tablet by mouth daily.     potassium chloride (KLOR-CON) 10  MEQ tablet TAKE 1 TABLET EVERY DAY 90 tablet 1   RESTASIS 0.05 % ophthalmic emulsion      traMADol (ULTRAM) 50 MG tablet Take 1 tablet (50 mg total) by mouth every 8 (eight) hours as needed. 90 tablet 0   vitamin B-12 (CYANOCOBALAMIN) 1000 MCG tablet Take 1,000 mcg by mouth daily.     warfarin (COUMADIN) 5 MG tablet Take 0.5-1 tablets (2.5-5 mg total) by mouth See admin instructions. Take 5 mg daily on Monday and Thursday. Take 2.5 mg all other days. 180 tablet 1   carvedilol (COREG) 6.25 MG tablet Take 1 tablet (6.25 mg total) by mouth 2 (two) times daily with a meal. 180 tablet 1   No facility-administered medications prior to visit.    Allergies  Allergen Reactions   Penicillins     Childhood allergy Has patient had a PCN reaction causing immediate rash, facial/tongue/throat swelling, SOB or lightheadedness with hypotension: Unknown Has patient had a PCN reaction causing severe rash involving mucus membranes or skin necrosis: Unknown Has patient had a PCN reaction that required hospitalization: Unknown Has patient had a PCN reaction occurring within the last 10 years: No If all of the above answers are "NO", then may proceed with Cephalosporin use.     ROS Review of Systems    Objective:    Physical Exam Constitutional:      Appearance: He is well-developed.  HENT:     Head: Normocephalic and atraumatic.     Right Ear: External ear normal.     Left Ear: External ear normal.     Nose: Nose normal.  Eyes:     Conjunctiva/sclera: Conjunctivae normal.     Pupils: Pupils are equal, round, and reactive to light.  Neck:     Thyroid: No thyromegaly.  Cardiovascular:     Rate and Rhythm: Normal rate and regular rhythm.     Heart sounds: Normal heart sounds.  Pulmonary:     Effort: Pulmonary effort is normal.     Breath sounds: Normal breath sounds.  Abdominal:     General: Bowel sounds are normal. There is no distension.     Palpations: Abdomen is soft. There is no mass.      Tenderness: There is no abdominal tenderness. There is no guarding or rebound.  Musculoskeletal:        General: Normal range of motion.     Cervical back: Normal range of motion and neck supple.  Lymphadenopathy:     Cervical: No cervical adenopathy.  Skin:    General: Skin  is warm and dry.  Neurological:     Mental Status: He is alert and oriented to person, place, and time.     Deep Tendon Reflexes: Reflexes are normal and symmetric.  Psychiatric:        Behavior: Behavior normal.        Thought Content: Thought content normal.        Judgment: Judgment normal.    BP (!) 148/80   Pulse 75   Ht 6\' 2"  (1.88 m)   Wt (!) 302 lb (137 kg)   SpO2 96%   BMI 38.77 kg/m  Wt Readings from Last 3 Encounters:  06/13/21 (!) 302 lb (137 kg)  04/06/21 299 lb (135.6 kg)  03/28/21 299 lb (135.6 kg)     Health Maintenance Due  Topic Date Due   COVID-19 Vaccine (3 - Booster for Pfizer series) 08/05/2020    There are no preventive care reminders to display for this patient.  Lab Results  Component Value Date   TSH 3.18 04/30/2017   Lab Results  Component Value Date   WBC 6.4 06/23/2020   HGB 14.5 06/23/2020   HCT 42.7 06/23/2020   MCV 93.2 06/23/2020   PLT 211 06/23/2020   Lab Results  Component Value Date   NA 140 06/23/2020   K 4.8 06/23/2020   CO2 31 06/23/2020   GLUCOSE 114 (H) 06/23/2020   BUN 16 06/23/2020   CREATININE 0.92 06/23/2020   BILITOT 3.3 (H) 06/23/2020   ALKPHOS 68 04/30/2017   AST 23 06/23/2020   ALT 24 06/23/2020   PROT 6.4 06/23/2020   ALBUMIN 3.5 (L) 04/30/2017   CALCIUM 9.5 06/23/2020   ANIONGAP 6 08/02/2018   Lab Results  Component Value Date   CHOL 135 06/23/2020   Lab Results  Component Value Date   HDL 45 06/23/2020   Lab Results  Component Value Date   LDLCALC 75 06/23/2020   Lab Results  Component Value Date   TRIG 72 06/23/2020   Lab Results  Component Value Date   CHOLHDL 3.0 06/23/2020   Lab Results  Component  Value Date   HGBA1C 4.9 10/06/2018      Assessment & Plan:   Problem List Items Addressed This Visit       Cardiovascular and Mediastinum   ESSENTIAL HYPERTENSION, BENIGN    Pressure not well controlled.  We discussed increasing carvedilol to 12.5 mg.  New prescription sent to pharmacy.  He will have an INR follow-up in 1 month.       Relevant Medications   carvedilol (COREG) 12.5 MG tablet   Atrial fibrillation (HCC)    INR follow-up in 1 month.       Relevant Medications   carvedilol (COREG) 12.5 MG tablet   Other Relevant Orders   POCT INR (Completed)   CBC   COMPLETE METABOLIC PANEL WITH GFR   Lipid panel   Other Visit Diagnoses     Wellness examination    -  Primary   Relevant Orders   CBC   COMPLETE METABOLIC PANEL WITH GFR   Lipid panel   PSA   Screening for prostate cancer       Relevant Orders   PSA        Keep up a regular exercise program and make sure you are eating a healthy diet Try to eat 4 servings of dairy a day, or if you are lactose intolerant take a calcium with vitamin D daily.  Your vaccines are  up to date.   Meds ordered this encounter  Medications   carvedilol (COREG) 12.5 MG tablet    Sig: Take 1 tablet (12.5 mg total) by mouth 2 (two) times daily with a meal.    Dispense:  180 tablet    Refill:  1     Follow-up: Return in about 6 months (around 12/14/2021) for Hypertension.    Beatrice Lecher, MD

## 2021-06-13 NOTE — Assessment & Plan Note (Signed)
INR follow-up in 1 month.

## 2021-06-13 NOTE — Patient Instructions (Signed)
F/U in 3 wks for nurse visit for INR check.

## 2021-06-13 NOTE — Assessment & Plan Note (Signed)
Pressure not well controlled.  We discussed increasing carvedilol to 12.5 mg.  New prescription sent to pharmacy.  He will have an INR follow-up in 1 month.

## 2021-06-14 LAB — COMPLETE METABOLIC PANEL WITH GFR
AG Ratio: 1.7 (calc) (ref 1.0–2.5)
ALT: 24 U/L (ref 9–46)
AST: 21 U/L (ref 10–35)
Albumin: 4 g/dL (ref 3.6–5.1)
Alkaline phosphatase (APISO): 61 U/L (ref 35–144)
BUN: 16 mg/dL (ref 7–25)
CO2: 30 mmol/L (ref 20–32)
Calcium: 9 mg/dL (ref 8.6–10.3)
Chloride: 106 mmol/L (ref 98–110)
Creat: 0.94 mg/dL (ref 0.70–1.28)
Globulin: 2.4 g/dL (calc) (ref 1.9–3.7)
Glucose, Bld: 98 mg/dL (ref 65–99)
Potassium: 4.6 mmol/L (ref 3.5–5.3)
Sodium: 142 mmol/L (ref 135–146)
Total Bilirubin: 2.6 mg/dL — ABNORMAL HIGH (ref 0.2–1.2)
Total Protein: 6.4 g/dL (ref 6.1–8.1)
eGFR: 87 mL/min/{1.73_m2} (ref >=60)

## 2021-06-14 LAB — LIPID PANEL
Cholesterol: 134 mg/dL (ref <200)
HDL: 42 mg/dL (ref >=40)
LDL Cholesterol (Calc): 76 mg/dL (calc)
Non-HDL Cholesterol (Calc): 92 mg/dL (calc) (ref <130)
Total CHOL/HDL Ratio: 3.2 (calc) (ref <5.0)
Triglycerides: 81 mg/dL (ref <150)

## 2021-06-14 LAB — CBC
HCT: 42.5 % (ref 38.5–50.0)
Hemoglobin: 14.5 g/dL (ref 13.2–17.1)
MCH: 31.7 pg (ref 27.0–33.0)
MCHC: 34.1 g/dL (ref 32.0–36.0)
MCV: 92.8 fL (ref 80.0–100.0)
MPV: 10.7 fL (ref 7.5–12.5)
Platelets: 209 10*3/uL (ref 140–400)
RBC: 4.58 10*6/uL (ref 4.20–5.80)
RDW: 11.6 % (ref 11.0–15.0)
WBC: 6.3 10*3/uL (ref 3.8–10.8)

## 2021-06-14 LAB — PSA: PSA: 2.39 ng/mL (ref <=4.00)

## 2021-06-14 NOTE — Progress Notes (Signed)
All labs are normal. 

## 2021-07-03 ENCOUNTER — Other Ambulatory Visit: Payer: Self-pay | Admitting: Family Medicine

## 2021-07-04 ENCOUNTER — Ambulatory Visit (INDEPENDENT_AMBULATORY_CARE_PROVIDER_SITE_OTHER): Payer: Medicare HMO | Admitting: Family Medicine

## 2021-07-04 ENCOUNTER — Other Ambulatory Visit: Payer: Self-pay

## 2021-07-04 VITALS — BP 145/81 | HR 68

## 2021-07-04 DIAGNOSIS — I4891 Unspecified atrial fibrillation: Secondary | ICD-10-CM | POA: Diagnosis not present

## 2021-07-04 LAB — POCT INR: INR: 2.6 (ref 2.0–3.0)

## 2021-07-04 NOTE — Progress Notes (Signed)
Patient advised.

## 2021-07-04 NOTE — Progress Notes (Signed)
Bp high today.  Was normal at last OV. Will recheck in one month. Make sure avoiding salt and taking meds regularly.

## 2021-07-31 ENCOUNTER — Telehealth: Payer: Self-pay | Admitting: Family Medicine

## 2021-07-31 DIAGNOSIS — I10 Essential (primary) hypertension: Secondary | ICD-10-CM

## 2021-07-31 MED ORDER — TRAMADOL HCL 50 MG PO TABS
50.0000 mg | ORAL_TABLET | Freq: Three times a day (TID) | ORAL | 0 refills | Status: DC | PRN
Start: 2021-07-31 — End: 2021-09-22

## 2021-07-31 MED ORDER — LISINOPRIL 40 MG PO TABS: 40.0000 mg | ORAL_TABLET | Freq: Every day | ORAL | 1 refills | Status: DC

## 2021-07-31 NOTE — Telephone Encounter (Signed)
Pended Tramadol prescription.

## 2021-07-31 NOTE — Telephone Encounter (Signed)
Jerome Hicks called in requesting refills for Tramadol and Lisinopril. These are through [facility].

## 2021-07-31 NOTE — Telephone Encounter (Signed)
Meds ordered this encounter  Medications   lisinopril (ZESTRIL) 40 MG tablet    Sig: Take 1 tablet (40 mg total) by mouth daily.    Dispense:  90 tablet    Refill:  1   traMADol (ULTRAM) 50 MG tablet    Sig: Take 1 tablet (50 mg total) by mouth every 8 (eight) hours as needed.    Dispense:  90 tablet    Refill:  0

## 2021-08-04 ENCOUNTER — Other Ambulatory Visit: Payer: Self-pay

## 2021-08-04 ENCOUNTER — Ambulatory Visit (INDEPENDENT_AMBULATORY_CARE_PROVIDER_SITE_OTHER): Payer: Medicare HMO | Admitting: Family Medicine

## 2021-08-04 VITALS — BP 144/72 | HR 69

## 2021-08-04 DIAGNOSIS — I4891 Unspecified atrial fibrillation: Secondary | ICD-10-CM

## 2021-08-04 LAB — POCT INR: INR: 2.9 (ref 2.0–3.0)

## 2021-08-04 NOTE — Progress Notes (Signed)
LMOM notifying pt to repeat INR check in 4 weeks.

## 2021-08-04 NOTE — Progress Notes (Signed)
Repeat 4 weeks.

## 2021-08-08 ENCOUNTER — Telehealth: Payer: Self-pay | Admitting: Family Medicine

## 2021-08-08 DIAGNOSIS — I10 Essential (primary) hypertension: Secondary | ICD-10-CM

## 2021-08-08 MED ORDER — CARVEDILOL 25 MG PO TABS: 25.0000 mg | ORAL_TABLET | Freq: Two times a day (BID) | ORAL | 3 refills | Status: DC

## 2021-08-08 NOTE — Telephone Encounter (Signed)
Sent in new rx for carvedilol for higher dose.  Recheck BP at next INR check

## 2021-08-08 NOTE — Telephone Encounter (Signed)
Patient said he wanted to know about his BP. Has not heard anything back and wanted to make sure everything was okay since still elevated.

## 2021-08-09 NOTE — Telephone Encounter (Signed)
Pt informed of new prescription for increased dosage of carvedilol being sent to pharmacy.  Pt expressed understanding.  Charyl Bigger, CMA

## 2021-08-21 ENCOUNTER — Other Ambulatory Visit: Payer: Self-pay

## 2021-08-29 ENCOUNTER — Other Ambulatory Visit: Payer: Self-pay | Admitting: Family Medicine

## 2021-08-29 DIAGNOSIS — I10 Essential (primary) hypertension: Secondary | ICD-10-CM

## 2021-09-08 ENCOUNTER — Other Ambulatory Visit: Payer: Self-pay | Admitting: Family Medicine

## 2021-09-08 ENCOUNTER — Encounter: Payer: Self-pay | Admitting: Family Medicine

## 2021-09-08 ENCOUNTER — Ambulatory Visit: Payer: Medicare HMO

## 2021-09-08 ENCOUNTER — Ambulatory Visit (INDEPENDENT_AMBULATORY_CARE_PROVIDER_SITE_OTHER): Payer: Medicare HMO | Admitting: Family Medicine

## 2021-09-08 VITALS — BP 162/92 | HR 67 | Temp 97.6°F | Ht 74.0 in | Wt 309.0 lb

## 2021-09-08 DIAGNOSIS — I83019 Varicose veins of right lower extremity with ulcer of unspecified site: Secondary | ICD-10-CM | POA: Diagnosis not present

## 2021-09-08 DIAGNOSIS — R21 Rash and other nonspecific skin eruption: Secondary | ICD-10-CM | POA: Diagnosis not present

## 2021-09-08 DIAGNOSIS — I83029 Varicose veins of left lower extremity with ulcer of unspecified site: Secondary | ICD-10-CM

## 2021-09-08 DIAGNOSIS — L97929 Non-pressure chronic ulcer of unspecified part of left lower leg with unspecified severity: Secondary | ICD-10-CM | POA: Diagnosis not present

## 2021-09-08 DIAGNOSIS — I4891 Unspecified atrial fibrillation: Secondary | ICD-10-CM

## 2021-09-08 DIAGNOSIS — R600 Localized edema: Secondary | ICD-10-CM | POA: Diagnosis not present

## 2021-09-08 DIAGNOSIS — I10 Essential (primary) hypertension: Secondary | ICD-10-CM

## 2021-09-08 DIAGNOSIS — E8770 Fluid overload, unspecified: Secondary | ICD-10-CM

## 2021-09-08 DIAGNOSIS — L97919 Non-pressure chronic ulcer of unspecified part of right lower leg with unspecified severity: Secondary | ICD-10-CM | POA: Diagnosis not present

## 2021-09-08 DIAGNOSIS — Z23 Encounter for immunization: Secondary | ICD-10-CM

## 2021-09-08 LAB — POCT INR: INR: 2.7 (ref 2.0–3.0)

## 2021-09-08 MED ORDER — MUPIROCIN 2 % EX OINT: TOPICAL_OINTMENT | Freq: Two times a day (BID) | CUTANEOUS | 0 refills | Status: DC

## 2021-09-08 MED ORDER — BETAMETHASONE DIPROPIONATE 0.05 % EX CREA: TOPICAL_CREAM | Freq: Every day | CUTANEOUS | 0 refills | Status: DC | PRN

## 2021-09-08 NOTE — Patient Instructions (Addendum)
Increase your furosemide to 2 tabs daily in the mornings.  When I see if this helps pull some fluid as well as help lower your blood pressure. You will need to go to the lab next week. Please follow your weight daily on your home scale.  I really want you down about 7 pounds.

## 2021-09-08 NOTE — Progress Notes (Signed)
Established Patient Office Visit  Subjective:  Patient ID: Jerome Hicks, male    DOB: [birth year]  Age: 70 y.o. MRN: [id number]  CC:  Chief Complaint  Patient presents with   Coagulation Disorder   Hypertension    HPI Jerome Hicks presents for   Shortness of breath.  He says for about the last month he has noticed increased shortness of breath with activity.  No chest pain.  He is also noticed his been harder to get his compression stockings on in the morning for also about a month.  His weight is also up about 7 pounds.  He has a persistent dry patch just above his left eye and wanted to know if it could be psoriasis.  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  12-08   CHOLECYSTECTOMY  1971   kidney stones  1990's   Ureteroscopic stone extraction   TONSILLECTOMY     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   Procedure: RIGHT TOTAL KNEE ARTHROPLASTY;  Surgeon: Dorna Leitz, MD;  Location: [facility];  Service: Orthopedics;  Laterality: Right;    Family History  Problem Relation Age of Onset   Heart attack Mother 90   Heart attack Father 51   Heart attack Sister 66   Heart attack Brother 80   Multiple sclerosis Daughter     Social History   Socioeconomic History   Marital status: Widowed    Spouse name: Not on file   Number of children: 2   Years of education: 14   Highest education level: Associate degree:  occupational, Hotel manager, or vocational program  Occupational History   Occupation: On disability     Comment: retired  Tobacco Use   Smoking status: Never   Smokeless tobacco: Current    Types: Chew  Vaping Use   Vaping Use: Never used  Substance and Sexual Activity   Alcohol use: No   Drug use: No   Sexual activity: Not Currently  Other Topics Concern   Not on file  Social History Narrative   Staying active   Splits wood   Social Determinants of Health   Financial Resource Strain: Low Risk    Difficulty of Paying Living Expenses: Not very hard  Food Insecurity: Not on file  Transportation Needs: Not on file  Physical Activity: Not on file  Stress: Not on file  Social Connections: Not on file  Intimate Partner Violence: Not on file    Outpatient Medications Prior to Visit  Medication Sig Dispense Refill   Carboxymethylcellul-Glycerin (LUBRICATING EYE DROPS OP) Place 1 drop into both eyes daily as needed (dry eyes).     carvedilol (COREG) 25 MG tablet Take 1 tablet (25 mg total) by mouth 2 (two) times daily with a meal. 60 tablet 3   clotrimazole-betamethasone (LOTRISONE)  cream Apply 1 application topically daily as needed (rash).      doxazosin (CARDURA) 4 MG tablet TAKE 1 TABLET EVERY DAY 90 tablet 3   furosemide (LASIX) 40 MG tablet TAKE 1 TO 2 TABLETS DAILY AS NEEDED FOR EDEMA. 180 tablet 0   lisinopril (ZESTRIL) 40 MG tablet Take 1 tablet (40 mg total) by mouth daily. 90 tablet 1   meclizine (ANTIVERT) 25 MG tablet TAKE 1 TABLET THREE TIMES DAILY AS NEEDED  FOR  DIZZINESS  OR  NAUSEA  AS DIRECTED 90 tablet 1   Multiple Vitamin (MULTIVITAMIN WITH MINERALS) TABS tablet Take 1 tablet by mouth daily.     potassium chloride (KLOR-CON) 10 MEQ tablet TAKE 1 TABLET EVERY DAY 90 tablet 1   RESTASIS 0.05 % ophthalmic emulsion      traMADol (ULTRAM) 50 MG tablet Take 1 tablet (50 mg total) by mouth every 8 (eight) hours as needed. 90 tablet 0   vitamin B-12 (CYANOCOBALAMIN) 1000  MCG tablet Take 1,000 mcg by mouth daily.     warfarin (COUMADIN) 5 MG tablet Take 0.5-1 tablets (2.5-5 mg total) by mouth See admin instructions. Take 5 mg daily on Monday and Thursday. Take 2.5 mg all other days. 180 tablet 1   No facility-administered medications prior to visit.    Allergies  Allergen Reactions   Penicillins     Childhood allergy Has patient had a PCN reaction causing immediate rash, facial/tongue/throat swelling, SOB or lightheadedness with hypotension: Unknown Has patient had a PCN reaction causing severe rash involving mucus membranes or skin necrosis: Unknown Has patient had a PCN reaction that required hospitalization: Unknown Has patient had a PCN reaction occurring within the last 10 years: No If all of the above answers are "NO", then may proceed with Cephalosporin use.     ROS Review of Systems    Objective:    Physical Exam Constitutional:      Appearance: Normal appearance. He is well-developed.  HENT:     Head: Normocephalic and atraumatic.  Cardiovascular:     Rate and Rhythm: Normal rate. Rhythm irregular.     Heart sounds: Normal heart sounds.  Pulmonary:     Effort: Pulmonary effort is normal.     Breath sounds: Normal breath sounds.  Skin:    General: Skin is warm and dry.  Neurological:     Mental Status: He is alert and oriented to person, place, and time. Mental status is at baseline.  Psychiatric:        Behavior: Behavior normal.      BP (!) 162/92   Pulse 67   Temp 97.6 F (36.4 C)   Ht 6' 2"  (1.88 m)   Wt (!) 309 lb (140.2 kg)   SpO2 98%   BMI 39.67 kg/m  Wt Readings from Last 3 Encounters:  09/08/21 (!) 309 lb (140.2 kg)  06/13/21 (!) 302 lb (137 kg)  04/06/21 299 lb (135.6 kg)     Health Maintenance Due  Topic Date Due   COVID-19 Vaccine (3 - Booster for Pfizer series) 08/05/2020    There are no preventive care reminders to display for this patient.  Lab Results  Component Value Date   TSH 3.18  04/30/2017   Lab Results  Component Value Date   WBC 6.3 06/13/2021   HGB 14.5 06/13/2021   HCT 42.5 06/13/2021   MCV 92.8 06/13/2021   PLT 209 06/13/2021   Lab Results  Component Value Date   NA 142 06/13/2021  K 4.6 06/13/2021   CO2 30 06/13/2021   GLUCOSE 98 06/13/2021   BUN 16 06/13/2021   CREATININE 0.94 06/13/2021   BILITOT 2.6 (H) 06/13/2021   ALKPHOS 68 04/30/2017   AST 21 06/13/2021   ALT 24 06/13/2021   PROT 6.4 06/13/2021   ALBUMIN 3.5 (L) 04/30/2017   CALCIUM 9.0 06/13/2021   ANIONGAP 6 08/02/2018   EGFR 87 06/13/2021   Lab Results  Component Value Date   CHOL 134 06/13/2021   Lab Results  Component Value Date   HDL 42 06/13/2021   Lab Results  Component Value Date   LDLCALC 76 06/13/2021   Lab Results  Component Value Date   TRIG 81 06/13/2021   Lab Results  Component Value Date   CHOLHDL 3.2 06/13/2021   Lab Results  Component Value Date   HGBA1C 4.9 10/06/2018      Assessment & Plan:   Problem List Items Addressed This Visit       Cardiovascular and Mediastinum   ESSENTIAL HYPERTENSION, BENIGN - Primary           Atrial fibrillation (HCC)    INR optimal today plan to repeat in 1 month.      Relevant Orders   POCT INR (Completed)   Other Visit Diagnoses     Need for immunization against influenza       Relevant Orders   Flu Vaccine QUAD High Dose(Fluad) (Completed)   Hypervolemia, unspecified hypervolemia type       Relevant Orders   BASIC METABOLIC PANEL WITH GFR   B Nat Peptide   TSH   Rash       Relevant Medications   betamethasone dipropionate 0.05 % cream   Venous stasis ulcers of both lower extremities (HCC)           Shortness of breath-I feel like this is from volume overload.  No abnormal chest sounds no cough or sputum production and no other upper respiratory symptoms.  Volume overload-we will check a BNP and TSH today as well as electrolytes.  He denies any recent changes in diet or salt intake etc.   The only change that we made as we increased his carvedilol recently to help lower his blood pressure which has still been elevated.  Given 60 mg of IM Lasix today and will start taking 2 tabs of his 40 mg furosemide in the morning starting tomorrow.  Encouraged him to monitor his weight daily on his home scale.  Blood pressure is elevated today but I really feel like it is from volume overload.  We will try to pull some extra fluid off and see if the blood pressure normalizes if not then we can consider adding another medication or switching to a more potent ARB.  Venous stasis ulcers-did prescribe mupirocin ointment we will pull the fluid off which should help and then he will be able to wear his compression stockings.  If not improving over the next week or 2 then please let me know.  Meds ordered this encounter  Medications   mupirocin ointment (BACTROBAN) 2 %    Sig: Apply topically 2 (two) times daily. X 7 days to wounds on lower legs.    Dispense:  30 g    Refill:  0   betamethasone dipropionate 0.05 % cream    Sig: Apply topically daily as needed. No more than 10 days at a time for the rash. Apply think layer    Dispense:  30 g  Refill:  0    Follow-up: Return in about 2 weeks (around 09/22/2021) for BP and weight check .    Beatrice Lecher, MD

## 2021-09-08 NOTE — Assessment & Plan Note (Signed)
INR optimal today plan to repeat in 1 month.

## 2021-09-09 LAB — TSH: TSH: 4.16 mIU/L (ref 0.40–4.50)

## 2021-09-09 LAB — BASIC METABOLIC PANEL WITH GFR
BUN: 19 mg/dL (ref 7–25)
CO2: 28 mmol/L (ref 20–32)
Calcium: 9.1 mg/dL (ref 8.6–10.3)
Chloride: 107 mmol/L (ref 98–110)
Creat: 1.04 mg/dL (ref 0.70–1.28)
Glucose, Bld: 106 mg/dL — ABNORMAL HIGH (ref 65–99)
Potassium: 4.5 mmol/L (ref 3.5–5.3)
Sodium: 142 mmol/L (ref 135–146)
eGFR: 77 mL/min/{1.73_m2} (ref >=60)

## 2021-09-09 LAB — BRAIN NATRIURETIC PEPTIDE: Brain Natriuretic Peptide: 236 pg/mL — ABNORMAL HIGH (ref <100)

## 2021-09-11 NOTE — Progress Notes (Signed)
Call patient: Metabolic panel looks great.  BNP is elevated which is an indication of increased volume and fluid from his heart.  Hopefully his weight is coming down which means it were pulling some of that extra fluid off.  Thyroid looks okay.

## 2021-09-12 ENCOUNTER — Telehealth: Payer: Self-pay | Admitting: Family Medicine

## 2021-09-12 NOTE — Telephone Encounter (Signed)
Patient called and states the Rx: Betamethasone is too expensive and he needs something else called in instead. Please update patient

## 2021-09-12 NOTE — Telephone Encounter (Signed)
Please call pharmacy and see what they would recommend similar that jmay be moe affordable.

## 2021-09-13 MED ORDER — CLOTRIMAZOLE-BETAMETHASONE 1-0.05 % EX CREA: 1.0000 "application " | TOPICAL_CREAM | Freq: Every day | CUTANEOUS | 1 refills | Status: DC | PRN

## 2021-09-13 NOTE — Telephone Encounter (Signed)
Pt advised of new RX.  Advised pt to call back if he finds that this medication is too expensive as well.  Pt expressed understanding and is agreeable.  Charyl Bigger, CMA

## 2021-09-13 NOTE — Telephone Encounter (Signed)
Meds ordered this encounter  Medications   clotrimazole-betamethasone (LOTRISONE) cream    Sig: Apply 1 application topically daily as needed (rash). Dont use for more than 2 weeks at a time    Dispense:  45 g    Refill:  1

## 2021-09-13 NOTE — Telephone Encounter (Signed)
Spoke with pharmacist who stated that the pt has a $45 copay for this medication.  Pharmacist stated that she doesn't know what would be cheaper for the patient, but that you could send in another prescription for a different medication and they will run it through insurance to see if it would be any cheaper.  Charyl Bigger, CMA

## 2021-09-14 ENCOUNTER — Telehealth: Payer: Self-pay | Admitting: Family Medicine

## 2021-09-14 NOTE — Telephone Encounter (Signed)
Ok to try to alternate to Lasix 1 on one day and 2 on the next and alternate each day for the next 2 weeks.

## 2021-09-14 NOTE — Telephone Encounter (Signed)
Pt informed.  Pt expressed understanding and is agreeable.  T. Aslee Such, CMA  

## 2021-09-14 NOTE — Telephone Encounter (Signed)
Pt called.  He wants Dr. Madilyn Fireman to know he lost 7 lbs and bp was 118/68 this morning.  Thank you.

## 2021-09-18 ENCOUNTER — Other Ambulatory Visit: Payer: Self-pay

## 2021-09-18 ENCOUNTER — Ambulatory Visit (INDEPENDENT_AMBULATORY_CARE_PROVIDER_SITE_OTHER): Payer: Medicare HMO | Admitting: Family Medicine

## 2021-09-18 VITALS — BP 130/68 | HR 64 | Ht 74.0 in | Wt 308.1 lb

## 2021-09-18 DIAGNOSIS — Z0000 Encounter for general adult medical examination without abnormal findings: Secondary | ICD-10-CM

## 2021-09-18 NOTE — Progress Notes (Signed)
MEDICARE ANNUAL WELLNESS VISIT  09/18/2021  Subjective:  Jerome Hicks is a 70 y.o. male patient of Metheney, Rene Kocher, MD who had a Medicare Annual Wellness Visit today. Jerome Hicks is Retired and lives with their spouse. he has 2 children. he reports that he is socially active and does interact with friends/family regularly. he is minimally physically active and enjoys woodworking.  Patient Care Team: Hali Marry, MD as PCP - General Darius Bump, [facility] as Pharmacist (Pharmacist)  Advanced Directives 09/18/2021 07/15/2019 08/20/2018 08/01/2018 08/01/2018 07/25/2018 04/18/2015  Does Patient Have a Medical Advance Directive? Yes Yes Yes - Yes Yes Yes  Type of Advance Directive Living will [place];Living will [place];Living will Living will - Living will -  Does patient want to make changes to medical advance directive? No - Patient declined No - Patient declined - No - Patient declined - No - Patient declined -  Copy of [place] in Chart? - No - copy requested - - - - Yes  Would patient like information on creating a medical advance directive? - - - - - Jerome Hicks Utilization Over the Past 12 Months: # of hospitalizations or ER visits: 0 # of surgeries: 0  Review of Systems    Patient reports that his overall health is unchanged when compared to last year.  Review of Systems: History obtained from chart review and the patient  All other systems negative.  Pain Assessment Pain : No/denies pain     Current Medications & Allergies (verified) Allergies as of 09/18/2021       Reactions   Penicillins    Childhood allergy Has patient had a PCN reaction causing immediate rash, facial/tongue/throat swelling, SOB or lightheadedness with hypotension: Unknown Has patient had a PCN reaction causing severe rash involving mucus membranes or skin necrosis: Unknown Has patient had a PCN reaction that required  hospitalization: Unknown Has patient had a PCN reaction occurring within the last 10 years: No If all of the above answers are "NO", then may proceed with Cephalosporin use.        Medication List        Accurate as of September 18, 2021  9:25 AM. If you have any questions, ask your nurse or doctor.          carvedilol 25 MG tablet Commonly known as: COREG Take 1 tablet (25 mg total) by mouth 2 (two) times daily with a meal.   clotrimazole-betamethasone cream Commonly known as: LOTRISONE Apply 1 application topically daily as needed (rash). Dont use for more than 2 weeks at a time   doxazosin 4 MG tablet Commonly known as: CARDURA TAKE 1 TABLET EVERY DAY   furosemide 40 MG tablet Commonly known as: LASIX TAKE 1 TO 2 TABLETS DAILY AS NEEDED FOR EDEMA.   lisinopril 40 MG tablet Commonly known as: ZESTRIL Take 1 tablet (40 mg total) by mouth daily.   LUBRICATING EYE DROPS OP Place 1 drop into both eyes daily as needed (dry eyes).   meclizine 25 MG tablet Commonly known as: ANTIVERT TAKE 1 TABLET THREE TIMES DAILY AS NEEDED  FOR  DIZZINESS  OR  NAUSEA  AS DIRECTED   multivitamin with minerals Tabs tablet Take 1 tablet by mouth daily.   mupirocin ointment 2 % Commonly known as: BACTROBAN Apply topically 2 (two) times daily. X 7 days to wounds on lower legs.   potassium chloride 10 MEQ tablet Commonly  known as: KLOR-CON TAKE 1 TABLET EVERY DAY   Restasis 0.05 % ophthalmic emulsion Generic drug: cycloSPORINE   traMADol 50 MG tablet Commonly known as: ULTRAM Take 1 tablet (50 mg total) by mouth every 8 (eight) hours as needed.   vitamin B-12 1000 MCG tablet Commonly known as: CYANOCOBALAMIN Take 1,000 mcg by mouth daily.   warfarin 5 MG tablet Commonly known as: COUMADIN Take as directed by the anticoagulation clinic. If you are unsure how to take this medication, talk to your nurse or doctor. Original instructions: Take 0.5-1 tablets (2.5-5 mg total) by  mouth See admin instructions. Take 5 mg daily on Monday and Thursday. Take 2.5 mg all other days.        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  12-08   CHOLECYSTECTOMY  1971   kidney stones  1990's   Ureteroscopic stone extraction   TONSILLECTOMY     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   Procedure: RIGHT TOTAL KNEE ARTHROPLASTY;  Surgeon: Dorna Leitz, MD;  Location: [facility];  Service: Orthopedics;  Laterality: Right;   Family History  Problem Relation Age of Onset   Heart attack Mother 26   Heart attack Father 80   Heart attack Sister 68   Heart attack Brother 75   Multiple sclerosis Daughter    Social History   Socioeconomic History   Marital status: Married    Spouse name: Becky   Number of children: 2   Years of education: 14   Highest education level: Associate degree: occupational, Hotel manager, or vocational program  Occupational History   Occupation: On disability     Comment: retired  Tobacco Use   Smoking status: Never   Smokeless tobacco: Former    Types: Chew    Quit date: 05/21/2021  Vaping Use   Vaping Use: Never used  Substance and Sexual Activity   Alcohol use: No   Drug use: No   Sexual activity: Not Currently  Other Topics Concern   Not on file  Social History Narrative   Lives with his wife. He has two children. He enjoys being outside and working with wood.    Social Determinants of Health   Hicks Resource Strain: Low Risk    Difficulty of Paying Living Expenses: Not hard at all  Food Insecurity: No Food Insecurity   Worried About Charity fundraiser in the Last Year: Never true   [place] in the Last Year: Never true  Transportation Needs: No Transportation Needs   Lack of Transportation (Medical): No   Lack of Transportation (Non-Medical): No  Physical Activity: Inactive   Days of Exercise per Week: 0 days   Minutes of Exercise per Session: 0 min  Stress: No Stress Concern Present   Feeling of Stress : Not  at all  Social Connections: Moderately Isolated   Frequency of Communication with Friends and Family: Three times a week   Frequency of Social Gatherings with Friends and Family: Once a week   Attends Religious Services: Never   Marine scientist or Organizations: No   Attends Archivist Meetings: Never   Marital Status: Married    Activities of Daily Living In your present state of health, do you have any difficulty performing the following activities: 09/18/2021  Hearing? Y  Comment has difficulty hearing in both ears. he hasn't had a hearing exam.  Vision? N  Difficulty concentrating or making decisions? N  Walking or climbing stairs? Y  Comment he gets short of breath while walking. he has to sit and rest.  Dressing or bathing? N  Doing errands, shopping? N  Preparing Food and eating ? N  Using the Toilet? N  In the past six months, have you accidently leaked urine? N  Do you have problems with loss of bowel control? N  Managing your Medications? N  Managing your Finances? N  Housekeeping or managing your Housekeeping? N  Some recent data might be hidden    Patient Education/Literacy How often do you need to have someone help you when you read instructions, pamphlets, or other written materials from your doctor or pharmacy?: 1 - Never What is the last grade level you completed in school?:  Associates degree  Exercise Current Exercise Habits: The patient does not participate in regular exercise at present, Exercise limited by: respiratory conditions(s)  Diet Patient reports consuming 3 meals a day and 0-1 snack(s) a day Patient reports that his primary diet is: Regular Patient reports that she does have regular access to food.   Depression Screen PHQ 2/9 Scores 09/18/2021 06/13/2021 06/23/2020 07/15/2019 06/08/2019 06/03/2018 04/09/2018  PHQ - 2 Score 0 0 0 0 0 0 0  PHQ- 9 Score - - - - - 3 -     Fall Risk Fall Risk  09/18/2021 06/13/2021 07/15/2019 06/08/2019 06/03/2018  Falls in the past year? 0 0 0 0 No  Number falls in past yr: 0 0 - 0 -  Injury with Fall? 0 0 0 0 -  Risk for fall due to : No Fall Risks No Fall Risks - - -  Follow up Falls evaluation completed Falls prevention discussed;Falls evaluation completed Falls prevention discussed - -     Objective:   BP 130/68 (BP Location: Left Arm, Patient Position: Sitting, Cuff Size: Large)   Pulse 64   Ht 6\' 2"  (1.88 m)   Wt (!) 308 lb 1.9 oz (139.8 kg)   SpO2 97%   BMI 39.56 kg/m   Last Weight  Most recent update: 09/18/2021  9:00 AM    Weight  139.8 kg (308 lb 1.9 oz)               Body mass index is 39.56 kg/m.  Hearing/Vision  Jerome Hicks did  have difficulty with hearing/understanding during the face-to-face interview Jerome Hicks did  have difficulty with his vision during the face-to-face interview Reports that he has had a formal eye exam by an eye care professional within the past year Reports that he has not had a formal hearing evaluation within the past year  Cognitive Function: 6CIT Screen 09/18/2021 06/23/2020 07/15/2019 06/08/2019 06/03/2018  What Year? 0 points 0 points 0 points 0 points 0 points  What month? 0 points 0 points 0 points 0 points 0 points  What  time? 0 points 0 points 0 points 0 points 0 points  Count back from 20 0 points 0 points 0 points 0 points 0 points  Months in reverse 0 points 0 points 0  points 0 points 0 points  Repeat phrase 0 points 0 points 0 points 4 points 4 points  Total Score 0 0 0 4 4    Normal Cognitive Function Screening: Yes (Normal:0-7, Significant for Dysfunction: >8)  Immunization & Health Maintenance Record Immunization History  Administered Date(s) Administered   Fluad Quad(high Dose 65+) 08/24/2020, 09/08/2021   Influenza Split 08/21/2012   Influenza Whole 08/27/2008, 11/07/2009, 10/18/2010, 08/20/2011   Influenza, High Dose Seasonal PF 08/26/2017, 08/08/2018, 08/03/2019   Influenza,inj,Quad PF,6+ Mos 08/25/2013, 08/05/2014, 08/31/2015, 08/28/2016   PFIZER(Purple Top)SARS-COV-2 Vaccination 02/12/2020, 03/05/2020   Pneumococcal Conjugate-13 09/29/2013   Pneumococcal Polysaccharide-23 07/14/2015   Td 12/03/1994, 06/15/2009   Zoster Recombinat (Shingrix) 06/07/2018, 08/21/2018   Zoster, Live 06/18/2011    Health Maintenance  Topic Date Due   COVID-19 Vaccine (3 - Booster for [place] series) 10/04/2021 (Originally 08/05/2020)   TETANUS/TDAP  12/03/2022 (Originally 06/16/2019)   COLONOSCOPY (Pts 45-40yrs Insurance coverage will need to be confirmed)  07/13/2022   INFLUENZA VACCINE  Completed   Hepatitis C Screening  Completed   Zoster Vaccines- Shingrix  Completed   HPV VACCINES  Aged Out       Assessment  This is a routine wellness examination for Jerome Hicks.  Health Maintenance: Due or Overdue There are no preventive care reminders to display for this patient.   Jerome Hicks does not need a referral for Community Assistance: Care Management:   no Social Work:    no Prescription Assistance:  no Nutrition/Diabetes Education:  no   Plan:  Personalized Goals  Goals Addressed               This Visit's Progress     Patient Stated (pt-stated)        09/18/2021 AWV Goal: Exercise for General Health  Patient will verbalize understanding of the benefits of increased physical activity: Exercising regularly is important. It will  improve your overall fitness, flexibility, and endurance. Regular exercise also will improve your overall health. It can help you control your weight, reduce stress, and improve your bone density. Over the next year, patient will increase physical activity as tolerated with a goal of at least 150 minutes of moderate physical activity per week.  You can tell that you are exercising at a moderate intensity if your heart starts beating faster and you start breathing faster but can still hold a conversation. Moderate-intensity exercise ideas include: Walking 1 mile (1.6 km) in about 15 minutes Biking Hiking Golfing Dancing Water aerobics Patient will verbalize understanding of everyday activities that increase physical activity by providing examples like the following: Yard work, such as: Sales promotion account executive Gardening Washing windows or floors Patient will be able to explain general safety guidelines for exercising:  Before you start a new exercise program, talk with your health care provider. Do not exercise so much that you hurt yourself, feel dizzy, or get very short of breath. Wear comfortable clothes and wear shoes with good support. Drink plenty of water while you exercise to prevent dehydration or heat stroke. Work out until your breathing and your heartbeat get faster.        Personalized Health Maintenance & Screening Recommendations  Td vaccine  Lung  Cancer Screening Recommended: no (Low Dose CT Chest recommended if Age 45-80 years, 30 pack-year currently smoking OR have quit w/in past 15 years) Hepatitis C Screening recommended: no HIV Screening recommended: no  Advanced Directives: Written information was not given per the patient's request.  Referrals & Orders No orders of the defined types were placed in this encounter.   Follow-up Plan Follow-up with Hali Marry, MD as  planned Schedule your tetanus at your pharmacy.  Medicare wellness visit in one year.  AVS printed and given to the patient.  I have personally reviewed and noted the following in the patient's chart:   Medical and social history Use of alcohol, tobacco or illicit drugs  Current medications and supplements Functional ability and status Nutritional status Physical activity Advanced directives List of other physicians Hospitalizations, surgeries, and ER visits in previous 12 months Vitals Screenings to include cognitive, depression, and falls Referrals and appointments  In addition, I have reviewed and discussed with patient certain preventive protocols, quality metrics, and best practice recommendations. A written personalized care plan for preventive services as well as general preventive health recommendations were provided to patient.     Tinnie Gens, RN  09/18/2021

## 2021-09-18 NOTE — Patient Instructions (Addendum)
[place] Maintenance Summary and Written Plan of Care  Jerome Hicks ,  Thank you for allowing me to perform your Medicare Annual Wellness Visit and for your ongoing commitment to your health.   Health Maintenance & Immunization History Health Maintenance  Topic Date Due   COVID-19 Vaccine (3 - Booster for Pfizer series) 10/04/2021 (Originally 08/05/2020)   TETANUS/TDAP  12/03/2022 (Originally 06/16/2019)   COLONOSCOPY (Pts 45-84yrs Insurance coverage will need to be confirmed)  07/13/2022   INFLUENZA VACCINE  Completed   Hepatitis C Screening  Completed   Zoster Vaccines- Shingrix  Completed   HPV VACCINES  Aged Out   Immunization History  Administered Date(s) Administered   Fluad Quad(high Dose 65+) 08/24/2020, 09/08/2021   Influenza Split 08/21/2012   Influenza Whole 08/27/2008, 11/07/2009, 10/18/2010, 08/20/2011   Influenza, High Dose Seasonal PF 08/26/2017, 08/08/2018, 08/03/2019   Influenza,inj,Quad PF,6+ Mos 08/25/2013, 08/05/2014, 08/31/2015, 08/28/2016   PFIZER(Purple Top)SARS-COV-2 Vaccination 02/12/2020, 03/05/2020   Pneumococcal Conjugate-13 09/29/2013   Pneumococcal Polysaccharide-23 07/14/2015   Td 12/03/1994, 06/15/2009   Zoster Recombinat (Shingrix) 06/07/2018, 08/21/2018   Zoster, Live 06/18/2011    These are the patient goals that we discussed:  Goals Addressed               This Visit's Progress     Patient Stated (pt-stated)        09/18/2021 AWV Goal: Exercise for General Health  Patient will verbalize understanding of the benefits of increased physical activity: Exercising regularly is important. It will improve your overall fitness, flexibility, and endurance. Regular exercise also will improve your overall health. It can help you control your weight, reduce stress, and improve your bone density. Over the next year, patient will increase physical activity as tolerated with a goal of at least 150 minutes of moderate  physical activity per week.  You can tell that you are exercising at a moderate intensity if your heart starts beating faster and you start breathing faster but can still hold a conversation. Moderate-intensity exercise ideas include: Walking 1 mile (1.6 km) in about 15 minutes Biking Hiking Golfing Dancing Water aerobics Patient will verbalize understanding of everyday activities that increase physical activity by providing examples like the following: Yard work, such as: Sales promotion account executive Gardening Washing windows or floors Patient will be able to explain general safety guidelines for exercising:  Before you start a new exercise program, talk with your health care provider. Do not exercise so much that you hurt yourself, feel dizzy, or get very short of breath. Wear comfortable clothes and wear shoes with good support. Drink plenty of water while you exercise to prevent dehydration or heat stroke. Work out until your breathing and your heartbeat get faster.          This is a list of Health Maintenance Items that are overdue or due now: Td vaccine    Orders/Referrals Placed Today: No orders of the defined types were placed in this encounter.  (Contact our referral department at [phone] if you have not spoken with someone about your referral appointment within the next 5 days)    Follow-up Plan Follow-up with Hali Marry, MD as planned Schedule your tetanus at your pharmacy.  Medicare wellness visit in one year.  AVS printed and given to the patient.    Health Maintenance, Male Adopting a healthy lifestyle and getting preventive care are important in promoting health  and wellness. Ask your health care provider about: The right schedule for you to have regular tests and exams. Things you can do on your own to prevent diseases and keep yourself healthy. What should I know  about diet, weight, and exercise? Eat a healthy diet  Eat a diet that includes plenty of vegetables, fruits, low-fat dairy products, and lean protein. Do not eat a lot of foods that are high in solid fats, added sugars, or sodium. Maintain a healthy weight Body mass index (BMI) is a measurement that can be used to identify possible weight problems. It estimates body fat based on height and weight. Your health care provider can help determine your BMI and help you achieve or maintain a healthy weight. Get regular exercise Get regular exercise. This is one of the most important things you can do for your health. Most adults should: Exercise for at least 150 minutes each week. The exercise should increase your heart rate and make you sweat (moderate-intensity exercise). Do strengthening exercises at least twice a week. This is in addition to the moderate-intensity exercise. Spend less time sitting. Even light physical activity can be beneficial. Watch cholesterol and blood lipids Have your blood tested for lipids and cholesterol at 71 years of age, then have this test every 5 years. You may need to have your cholesterol levels checked more often if: Your lipid or cholesterol levels are high. You are older than 71 years of age. You are at high risk for heart disease. What should I know about cancer screening? Many types of cancers can be detected early and may often be prevented. Depending on your health history and family history, you may need to have cancer screening at various ages. This may include screening for: Colorectal cancer. Prostate cancer. Skin cancer. Lung cancer. What should I know about heart disease, diabetes, and high blood pressure? Blood pressure and heart disease High blood pressure causes heart disease and increases the risk of stroke. This is more likely to develop in people who have high blood pressure readings, are of African descent, or are overweight. Talk with your  health care provider about your target blood pressure readings. Have your blood pressure checked: Every 3-5 years if you are 64-61 years of age. Every year if you are 32 years old or older. If you are between the ages of 60 and 21 and are a current or former smoker, ask your health care provider if you should have a one-time screening for abdominal aortic aneurysm (AAA). Diabetes Have regular diabetes screenings. This checks your fasting blood sugar level. Have the screening done: Once every three years after age 67 if you are at a normal weight and have a low risk for diabetes. More often and at a younger age if you are overweight or have a high risk for diabetes. What should I know about preventing infection? Hepatitis B If you have a higher risk for hepatitis B, you should be screened for this virus. Talk with your health care provider to find out if you are at risk for hepatitis B infection. Hepatitis C Blood testing is recommended for: Everyone born from [birth year] through 1966. Anyone with known risk factors for hepatitis C. Sexually transmitted infections (STIs) You should be screened each year for STIs, including gonorrhea and chlamydia, if: You are sexually active and are younger than 71 years of age. You are older than 71 years of age and your health care provider tells you that you are at risk for  this type of infection. Your sexual activity has changed since you were last screened, and you are at increased risk for chlamydia or gonorrhea. Ask your health care provider if you are at risk. Ask your health care provider about whether you are at high risk for HIV. Your health care provider may recommend a prescription medicine to help prevent HIV infection. If you choose to take medicine to prevent HIV, you should first get tested for HIV. You should then be tested every 3 months for as long as you are taking the medicine. Follow these instructions at home: Lifestyle Do not use any products  that contain nicotine or tobacco, such as cigarettes, e-cigarettes, and chewing tobacco. If you need help quitting, ask your health care provider. Do not use street drugs. Do not share needles. Ask your health care provider for help if you need support or information about quitting drugs. Alcohol use Do not drink alcohol if your health care provider tells you not to drink. If you drink alcohol: Limit how much you have to 0-2 drinks a day. Be aware of how much alcohol is in your drink. In the [place] one drink equals one 12 oz bottle of beer (355 mL), one 5 oz glass of wine (148 mL), or one 1 oz glass of hard liquor (44 mL). General instructions Schedule regular health, dental, and eye exams. Stay current with your vaccines. Tell your health care provider if: You often feel depressed. You have ever been abused or do not feel safe at home. Summary Adopting a healthy lifestyle and getting preventive care are important in promoting health and wellness. Follow your health care provider's instructions about healthy diet, exercising, and getting tested or screened for diseases. Follow your health care provider's instructions on monitoring your cholesterol and blood pressure. This information is not intended to replace advice given to you by your health care provider. Make sure you discuss any questions you have with your health care provider. Document Revised: 01/27/2021 Document Reviewed: 11/12/2018 Elsevier Patient Education  2022 [employer].

## 2021-09-22 ENCOUNTER — Other Ambulatory Visit: Payer: Self-pay

## 2021-09-22 ENCOUNTER — Telehealth: Payer: Self-pay | Admitting: Family Medicine

## 2021-09-22 ENCOUNTER — Ambulatory Visit (INDEPENDENT_AMBULATORY_CARE_PROVIDER_SITE_OTHER): Payer: Medicare HMO | Admitting: Family Medicine

## 2021-09-22 ENCOUNTER — Encounter: Payer: Self-pay | Admitting: Family Medicine

## 2021-09-22 ENCOUNTER — Ambulatory Visit: Payer: Self-pay | Admitting: Family Medicine

## 2021-09-22 VITALS — BP 136/69 | HR 61 | Ht 74.0 in | Wt 310.0 lb

## 2021-09-22 DIAGNOSIS — R7989 Other specified abnormal findings of blood chemistry: Secondary | ICD-10-CM | POA: Diagnosis not present

## 2021-09-22 DIAGNOSIS — R600 Localized edema: Secondary | ICD-10-CM | POA: Diagnosis not present

## 2021-09-22 DIAGNOSIS — I4891 Unspecified atrial fibrillation: Secondary | ICD-10-CM

## 2021-09-22 DIAGNOSIS — I10 Essential (primary) hypertension: Secondary | ICD-10-CM

## 2021-09-22 LAB — POCT INR: INR: 3.2 — AB (ref 2.0–3.0)

## 2021-09-22 MED ORDER — TRAMADOL HCL 50 MG PO TABS: 50.0000 mg | ORAL_TABLET | Freq: Three times a day (TID) | ORAL | 0 refills | Status: DC | PRN

## 2021-09-22 NOTE — Assessment & Plan Note (Signed)
I do think the edema is likely from some mild systolic heart failure.  BNP was elevated last office visit.  He is taking his Lasix but it is a little bit sporadic he is also been skipping some days so we discussed getting back on a really regimented routine of 1 tab alternating with 2 tabs in between daily and not skipping any days unless he just absolutely has to.  I really still think he has several pounds of fluid still present but overall there is no weeping and the extremities do look better and he feels less short of breath.

## 2021-09-22 NOTE — Assessment & Plan Note (Signed)
Home blood pressures look great.  Repeat blood pressure in the office today does look better.

## 2021-09-22 NOTE — Progress Notes (Signed)
Pt here for INR check no missed doses, diet changes,bruising,bleeding,CP,SOB .

## 2021-09-22 NOTE — Progress Notes (Signed)
5mg  on Thursday.  2.5 mg all other days.  Therapeutic, no changes, return in 2  weeks.

## 2021-09-22 NOTE — Patient Instructions (Signed)
5mg  on Thursday.  2.5 mg all other days.  Therapeutic, no changes, return in 2  weeks.

## 2021-09-22 NOTE — Assessment & Plan Note (Signed)
He has actually been very steady on his Coumadin regimen for months until this time.  Make adjustments per flowsheet.

## 2021-09-22 NOTE — Progress Notes (Signed)
Established Patient Office Visit  Subjective:  Patient ID: Jerome Hicks, male    DOB: [birth year]  Age: 70 y.o. MRN: [id number]  CC:  Chief Complaint  Patient presents with   Hypertension    HPI RADFORD PEASE presents for f/u LE edema.  When I saw him last time was volume overloaded with an elevated BNP.  He did increase his fluid pill for several days and feels like his legs are doing better he also uses this topical steroid cream and says that the skin also looks much better as well he has been very happy with the result.  He says he has been weighing himself at home and is down to 300 pounds on his home scale.  He has started alternating the Lasix 1 1 day to 1 day but then sometimes will skip a day.  Also due for his INR today.  Has been checking blood pressures at home and says that they have looked great with systolics in the 353I and even in the teens.  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)    get yearly PFT's   Ascending aortic aneurysm    seen on 01-31-18 ct chest ; patient denies awareness   Atrial fibrillation ([place])    Cataracts, bilateral    ED (erectile dysfunction)    has been prescribed Levitra and Viagrain in the past   Hypertension    Internal hemorrhoids    Kidney stones    Leg edema    recurrent cellulitis   Scrotal abscess 3-03   Shoulder arthritis    Sigmoid diverticulosis     Past Surgical History:  Procedure Laterality Date    MRSA infection in left groin requiring surgical debridement  12-08   CHOLECYSTECTOMY  1971   kidney stones  1990's   Ureteroscopic stone extraction   TONSILLECTOMY     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   Procedure: RIGHT TOTAL KNEE ARTHROPLASTY;  Surgeon: Dorna Leitz, MD;  Location: [facility];  Service: Orthopedics;  Laterality: Right;    Family History  Problem Relation Age of Onset   Heart attack Mother 38   Heart attack Father 26   Heart attack Sister 96   Heart attack  Brother 102   Multiple sclerosis Daughter     Social History   Socioeconomic History   Marital status: Married    Spouse name: Becky   Number of children: 2   Years of education: 14   Highest education level: Associate degree: occupational, Hotel manager, or vocational program  Occupational History   Occupation: On disability     Comment: retired  Tobacco Use   Smoking status: Never   Smokeless tobacco: Former    Types: Chew    Quit date: 05/21/2021  Vaping Use   Vaping Use: Never used  Substance and Sexual Activity   Alcohol use: No   Drug use: No   Sexual activity: Not Currently  Other Topics Concern   Not on file  Social History Narrative   Lives with his wife. He has two children. He enjoys being outside and working with wood.   Social Determinants of Health   Financial Resource Strain: Low Risk    Difficulty of Paying Living Expenses: Not hard at all  Food Insecurity: No Food Insecurity   Worried About Charity fundraiser in the Last Year: Never true   Ran Out of Food in the Last Year: Never true  Transportation Needs: No Data processing manager (Medical): No   Lack of Transportation (Non-Medical): No  Physical Activity: Inactive   Days of Exercise per Week: 0 days   Minutes of Exercise per Session: 0 min  Stress: No Stress Concern Present   Feeling of Stress : Not at all  Social Connections: Moderately Isolated   Frequency of Communication with Friends and Family: Three times a week   Frequency of Social Gatherings with Friends and Family: Once a week   Attends Religious Services: Never   Marine scientist or Organizations: No   Attends Music therapist: Never   Marital Status: Married  Human resources officer Violence: Not At Risk   Fear of Current or Ex-Partner: No   Emotionally Abused: No   Physically Abused: No   Sexually Abused: No    Outpatient Medications Prior to Visit  Medication Sig Dispense Refill    Carboxymethylcellul-Glycerin (LUBRICATING EYE DROPS OP) Place 1 drop into both eyes daily as needed (dry eyes).     carvedilol (COREG) 25 MG tablet Take 1 tablet (25 mg total) by mouth 2 (two) times daily with a meal. 60 tablet 3   clotrimazole-betamethasone (LOTRISONE) cream Apply 1 application topically daily as needed (rash). Dont use for more than 2 weeks at a time 45 g 1   doxazosin (CARDURA) 4 MG tablet TAKE 1 TABLET EVERY DAY 90 tablet 3   furosemide (LASIX) 40 MG tablet TAKE 1 TO 2 TABLETS DAILY AS NEEDED FOR EDEMA. 180 tablet 0   lisinopril (ZESTRIL) 40 MG tablet Take 1 tablet (40 mg total) by mouth daily. 90 tablet 1   meclizine (ANTIVERT) 25 MG tablet TAKE 1 TABLET THREE TIMES DAILY AS NEEDED  FOR  DIZZINESS  OR  NAUSEA  AS DIRECTED 90 tablet 1   Multiple Vitamin (MULTIVITAMIN WITH MINERALS) TABS tablet Take 1 tablet by mouth daily.     potassium chloride (KLOR-CON) 10 MEQ tablet TAKE 1 TABLET EVERY DAY 90 tablet 1   RESTASIS 0.05 % ophthalmic emulsion      vitamin B-12 (CYANOCOBALAMIN) 1000 MCG tablet Take 1,000 mcg by mouth daily.     warfarin (COUMADIN) 5 MG tablet Take 0.5-1 tablets (2.5-5 mg total) by mouth See admin instructions. Take 5 mg daily on Monday and Thursday. Take 2.5 mg all other days. 180 tablet 1   traMADol (ULTRAM) 50 MG tablet Take 1 tablet (50 mg total) by mouth every 8 (eight) hours as needed. 90 tablet 0   mupirocin ointment (BACTROBAN) 2 % Apply topically 2 (two) times daily. X 7 days to wounds on lower legs. 30 g 0   No facility-administered medications prior to visit.    Allergies  Allergen Reactions   Penicillins     Childhood allergy Has patient had a PCN reaction causing immediate rash, facial/tongue/throat swelling, SOB or lightheadedness with hypotension: Unknown Has patient had a PCN reaction causing severe rash involving mucus membranes or skin necrosis: Unknown Has patient had a PCN reaction that required hospitalization: Unknown Has patient had  a PCN reaction occurring within the last 10 years: No If all of the above answers are "NO", then may proceed with Cephalosporin use.     ROS Review of Systems    Objective:    Physical Exam Constitutional:      Appearance: Normal appearance. He is well-developed.  HENT:     Head: Normocephalic and atraumatic.  Cardiovascular:     Rate and Rhythm: Normal rate  and regular rhythm.     Heart sounds: Normal heart sounds.  Pulmonary:     Effort: Pulmonary effort is normal.     Breath sounds: Normal breath sounds.  Musculoskeletal:     Comments: Lower extremity swelling is improved but he still has some mild pitting edema.  The skin looks much better and that I do not see a lot of thick scale or breakage so that has improved as well.  Skin:    General: Skin is warm and dry.  Neurological:     Mental Status: He is alert and oriented to person, place, and time. Mental status is at baseline.  Psychiatric:        Behavior: Behavior normal.    BP 136/69   Pulse 61   Ht _0  (1.88 m)   Wt (!) 310 lb (140.6 kg)   SpO2 96%   BMI 39.80 kg/m  Wt Readings from Last 3 Encounters:  09/22/21 (!) 310 lb (140.6 kg)  09/18/21 (!) 308 lb 1.9 oz (139.8 kg)  09/08/21 (!) 309 lb (140.2 kg)     There are no preventive care reminders to display for this patient.  There are no preventive care reminders to display for this patient.  Lab Results  Component Value Date   TSH 4.16 09/08/2021   Lab Results  Component Value Date   WBC 6.3 06/13/2021   HGB 14.5 06/13/2021   HCT 42.5 06/13/2021   MCV 92.8 06/13/2021   PLT 209 06/13/2021   Lab Results  Component Value Date   NA 142 09/08/2021   K 4.5 09/08/2021   CO2 28 09/08/2021   GLUCOSE 106 (H) 09/08/2021   BUN 19 09/08/2021   CREATININE 1.04 09/08/2021   BILITOT 2.6 (H) 06/13/2021   ALKPHOS 68 04/30/2017   AST 21 06/13/2021   ALT 24 06/13/2021   PROT 6.4 06/13/2021   ALBUMIN 3.5 (L) 04/30/2017   CALCIUM 9.1 09/08/2021    ANIONGAP 6 08/02/2018   EGFR 77 09/08/2021   Lab Results  Component Value Date   CHOL 134 06/13/2021   Lab Results  Component Value Date   HDL 42 06/13/2021   Lab Results  Component Value Date   LDLCALC 76 06/13/2021   Lab Results  Component Value Date   TRIG 81 06/13/2021   Lab Results  Component Value Date   CHOLHDL 3.2 06/13/2021   Lab Results  Component Value Date   HGBA1C 4.9 10/06/2018      Assessment & Plan:   Problem List Items Addressed This Visit       Cardiovascular and Mediastinum   ESSENTIAL HYPERTENSION, BENIGN    Home blood pressures look great.  Repeat blood pressure in the office today does look better.      Relevant Orders   ECHOCARDIOGRAM COMPLETE   Atrial fibrillation ([place]) - Primary    He has actually been very steady on his Coumadin regimen for months until this time.  Make adjustments per flowsheet.      Relevant Orders   POCT INR (Completed)   ECHOCARDIOGRAM COMPLETE     Other   Lower leg edema    I do think the edema is likely from some mild systolic heart failure.  BNP was elevated last office visit.  He is taking his Lasix but it is a little bit sporadic he is also been skipping some days so we discussed getting back on a really regimented routine of 1 tab alternating with 2 tabs in  between daily and not skipping any days unless he just absolutely has to.  I really still think he has several pounds of fluid still present but overall there is no weeping and the extremities do look better and he feels less short of breath.      Relevant Orders   ECHOCARDIOGRAM COMPLETE   Other Visit Diagnoses     Elevated brain natriuretic peptide (BNP) level       Relevant Orders   ECHOCARDIOGRAM COMPLETE       Consider echocardiogram for further work-up.  Meds ordered this encounter  Medications   DISCONTD: traMADol (ULTRAM) 50 MG tablet    Sig: Take 1 tablet (50 mg total) by mouth every 8 (eight) hours as needed.    Dispense:  90  tablet    Refill:  0   traMADol (ULTRAM) 50 MG tablet    Sig: Take 1 tablet (50 mg total) by mouth every 8 (eight) hours as needed.    Dispense:  90 tablet    Refill:  0    Follow-up: Return in about 3 months (around 12/23/2021) for Hypertension.    Beatrice Lecher, MD

## 2021-09-22 NOTE — Telephone Encounter (Signed)
Please call patient and let him know that I like to get an echocardiogram of his heart I know we did it last year but with the retaining fluid like to get that updated.

## 2021-09-25 NOTE — Telephone Encounter (Signed)
Pt informed.  Pt is agreeable to echocardiogram.  Advised pt that if he has not heard from a scheduler by 09/29/2021 to call our office so that we may f/u on this for him.  Charyl Bigger, CMA

## 2021-10-04 ENCOUNTER — Ambulatory Visit (HOSPITAL_BASED_OUTPATIENT_CLINIC_OR_DEPARTMENT_OTHER)
Admission: RE | Admit: 2021-10-04 | Discharge: 2021-10-04 | Disposition: A | Discharge status: Home / Self Care | Payer: Medicare HMO | Source: Ambulatory Visit | Attending: Family Medicine | Admitting: Family Medicine

## 2021-10-04 ENCOUNTER — Other Ambulatory Visit: Payer: Self-pay

## 2021-10-04 DIAGNOSIS — R7989 Other specified abnormal findings of blood chemistry: Secondary | ICD-10-CM | POA: Diagnosis not present

## 2021-10-04 DIAGNOSIS — R600 Localized edema: Secondary | ICD-10-CM | POA: Insufficient documentation

## 2021-10-04 DIAGNOSIS — I4891 Unspecified atrial fibrillation: Secondary | ICD-10-CM | POA: Diagnosis not present

## 2021-10-04 DIAGNOSIS — I10 Essential (primary) hypertension: Secondary | ICD-10-CM | POA: Insufficient documentation

## 2021-10-04 LAB — ECHOCARDIOGRAM COMPLETE
AR max vel: 4.77 cm2
AV Area VTI: 4.42 cm2
AV Area mean vel: 5.09 cm2
AV Mean grad: 2 mmHg
AV Peak grad: 3.8 mmHg
Ao pk vel: 0.97 m/s
Area-P 1/2: 5.09 cm2
Calc EF: 61 %
S' Lateral: 3.9 cm
Single Plane A2C EF: 64.3 %
Single Plane A4C EF: 57.2 %

## 2021-10-04 MED ORDER — PERFLUTREN LIPID MICROSPHERE
1.0000 mL | INTRAVENOUS | Status: AC | PRN
Start: 2021-10-04 — End: 2021-10-04
  Administered 2021-10-04: 4 mL via INTRAVENOUS

## 2021-10-05 NOTE — Progress Notes (Signed)
Call patient: Echocardiogram of his heart shows normal function in regards to the pumping part of the heart.  There is a little bit of enlargement of the left ventricle this is usually from having high blood pressure so it is really important that we do a good job in controlling his blood pressure and keeping it under 130.  Notices blood pressures been creeping up a little bit the last few times that he has been here.  Check his blood pressure again when he comes back in for his next INR if it still greater than 130 then we will need to make an adjustment to his regimen continue to work on low-salt diet.

## 2021-10-06 ENCOUNTER — Ambulatory Visit (INDEPENDENT_AMBULATORY_CARE_PROVIDER_SITE_OTHER): Payer: Medicare HMO | Admitting: Family Medicine

## 2021-10-06 VITALS — BP 139/73 | HR 75 | Temp 97.2°F | Ht 74.0 in | Wt 310.0 lb

## 2021-10-06 DIAGNOSIS — I4891 Unspecified atrial fibrillation: Secondary | ICD-10-CM | POA: Diagnosis not present

## 2021-10-06 DIAGNOSIS — I10 Essential (primary) hypertension: Secondary | ICD-10-CM

## 2021-10-06 LAB — POCT INR: INR: 2.3 (ref 2.0–3.0)

## 2021-10-06 MED ORDER — VALSARTAN 320 MG PO TABS: 320.0000 mg | ORAL_TABLET | Freq: Every day | ORAL | 1 refills | Status: DC

## 2021-10-06 NOTE — Progress Notes (Signed)
Pt has been advised of medication change and d/c of lisinopril confirmed.

## 2021-10-06 NOTE — Progress Notes (Signed)
HTN - will d/c lisinopril and change to valsartan which is a little more powerful.  Then we will recheck his pressure when he comes back in a month for his INR.  He just needs to make sure that he stops the lisinopril completely.

## 2021-10-06 NOTE — Progress Notes (Signed)
Pt reports taking medication. No missed doses. No side effects.   BP elevated. Dr. Madilyn Fireman aware and making adjustments to current medications.

## 2021-10-09 ENCOUNTER — Other Ambulatory Visit: Payer: Self-pay | Admitting: Family Medicine

## 2021-10-09 DIAGNOSIS — I10 Essential (primary) hypertension: Secondary | ICD-10-CM

## 2021-10-18 ENCOUNTER — Other Ambulatory Visit: Payer: Self-pay | Admitting: Family Medicine

## 2021-10-18 DIAGNOSIS — I482 Chronic atrial fibrillation, unspecified: Secondary | ICD-10-CM

## 2021-11-01 ENCOUNTER — Telehealth: Payer: Self-pay | Admitting: Pharmacist

## 2021-11-01 ENCOUNTER — Ambulatory Visit (INDEPENDENT_AMBULATORY_CARE_PROVIDER_SITE_OTHER): Payer: Medicare HMO | Admitting: Family Medicine

## 2021-11-01 ENCOUNTER — Other Ambulatory Visit: Payer: Self-pay

## 2021-11-01 VITALS — BP 149/81 | HR 63 | Temp 98.6°F | Ht 74.0 in | Wt 315.0 lb

## 2021-11-01 DIAGNOSIS — B349 Viral infection, unspecified: Secondary | ICD-10-CM

## 2021-11-01 DIAGNOSIS — Z6841 Body Mass Index (BMI) 40.0 and over, adult: Secondary | ICD-10-CM | POA: Diagnosis not present

## 2021-11-01 DIAGNOSIS — R52 Pain, unspecified: Secondary | ICD-10-CM | POA: Diagnosis not present

## 2021-11-01 DIAGNOSIS — I482 Chronic atrial fibrillation, unspecified: Secondary | ICD-10-CM | POA: Diagnosis not present

## 2021-11-01 DIAGNOSIS — R110 Nausea: Secondary | ICD-10-CM | POA: Diagnosis not present

## 2021-11-01 LAB — POCT INFLUENZA A/B
Influenza A, POC: NEGATIVE
Influenza B, POC: NEGATIVE

## 2021-11-01 LAB — POCT INR: INR: 2.2 (ref 2.0–3.0)

## 2021-11-01 NOTE — Telephone Encounter (Signed)
Patient presented for follow-up appointment with pharmacist today for chronic care management, expressed request for covid testing due to symptoms of headache, fatigue, and being around family at Thanksgiving in which 6 family members resulted as positive.  Patient was transitioned to acute visit with PCP for further management.   Will send to schedule team for reschedule for pharmacy specific visit at a later date.  Jerome Hicks

## 2021-11-01 NOTE — Progress Notes (Signed)
Encounter Error - Chronic Care Management Pharmacist Visit Transitioned to Acute Visit with PCP.  Jerome Hicks

## 2021-11-01 NOTE — Progress Notes (Signed)
 Acute Office Visit  Subjective:    Patient ID: Jerome Hicks, male    DOB: 02/22/1951, 70 y.o.   MRN: [id number]  Chief Complaint  Patient presents with   Covid Exposure   Coagulation Disorder   Not HPI Patient is in today for not feeling well.  He was originally scheduled to see our clinical pharmacist but mention to her that he did not feel good and he thinks he might have COVID.  He had been around some sick family members over Thanksgiving and in the last couple days started feeling a little nauseated having a headache and feeling achy.  He denies any significant cough or nasal congestion though he says it does feel similar to when he had COVID previously.  He would also like to get his INR checked today if possible.  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)    get yearly PFT's   Ascending aortic aneurysm    seen on 01-31-18 ct chest ; patient denies awareness   Atrial fibrillation ([facility])    Cataracts, bilateral    ED (erectile dysfunction)    has been prescribed Levitra and Viagrain in the past   Hypertension    Internal hemorrhoids    Kidney stones    Leg edema    recurrent cellulitis   Scrotal abscess 3-03   Shoulder arthritis    Sigmoid diverticulosis     Past Surgical History:  Procedure Laterality Date    MRSA infection in left groin requiring surgical debridement  12-08   CHOLECYSTECTOMY  1971   kidney stones  1990's   Ureteroscopic stone extraction   TONSILLECTOMY     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   Procedure: RIGHT TOTAL KNEE ARTHROPLASTY;  Surgeon: Graves, John, MD;  Location: [facility];  Service: Orthopedics;  Laterality: Right;    Family History  Problem Relation Age of Onset   Heart attack Mother 73   Heart attack Father 74   Heart attack Sister 38   Heart attack Brother 43   Multiple sclerosis Daughter     Social History   Socioeconomic History   Marital status: Married    Spouse name: Becky    Number of children: 2   Years of education: 14   Highest education level: Associate degree: occupational, technical, or vocational program  Occupational History   Occupation: On disability     Comment: retired  Tobacco Use   Smoking status: Never   Smokeless tobacco: Former    Types: Chew    Quit date: 05/21/2021  Vaping Use   Vaping Use: Never used  Substance and Sexual Activity   Alcohol use: No   Drug use: No   Sexual activity: Not Currently  Other Topics Concern   Not on file  Social History Narrative   Lives with his wife. He has two children. He enjoys being outside and working with wood.   Social Determinants of Health   Financial Resource Strain: Low Risk    Difficulty of Paying Living Expenses: Not hard at all  Food Insecurity: No Food Insecurity   Worried About Running Out of Food in the Last Year: Never true   Ran Out of Food in the Last Year: Never true  Transportation Needs: No Transportation Needs   Lack of Transportation (Medical): No   Lack of Transportation (Non-Medical): No  Physical Activity: Inactive   Days of Exercise per Week: 0 days   Minutes   of Exercise per Session: 0 min  Stress: No Stress Concern Present   Feeling of Stress : Not at all  Social Connections: Moderately Isolated   Frequency of Communication with Friends and Family: Three times a week   Frequency of Social Gatherings with Friends and Family: Once a week   Attends Religious Services: Never   Marine scientist or Organizations: No   Attends Music therapist: Never   Marital Status: Married  Human resources officer Violence: Not At Risk   Fear of Current or Ex-Partner: No   Emotionally Abused: No   Physically Abused: No   Sexually Abused: No    Outpatient Medications Prior to Visit  Medication Sig Dispense Refill   Carboxymethylcellul-Glycerin (LUBRICATING EYE DROPS OP) Place 1 drop into both eyes daily as needed (dry eyes).     carvedilol (COREG) 25 MG tablet Take 1  tablet (25 mg total) by mouth 2 (two) times daily with a meal. 60 tablet 3   clotrimazole-betamethasone (LOTRISONE) cream Apply 1 application topically daily as needed (rash). Dont use for more than 2 weeks at a time 45 g 1   doxazosin (CARDURA) 4 MG tablet TAKE 1 TABLET EVERY DAY 90 tablet 3   furosemide (LASIX) 40 MG tablet TAKE 1 TO 2 TABLETS DAILY AS NEEDED FOR EDEMA. 180 tablet 0   meclizine (ANTIVERT) 25 MG tablet TAKE 1 TABLET THREE TIMES DAILY AS NEEDED  FOR  DIZZINESS  OR  NAUSEA  AS DIRECTED 90 tablet 1   Multiple Vitamin (MULTIVITAMIN WITH MINERALS) TABS tablet Take 1 tablet by mouth daily.     potassium chloride (KLOR-CON) 10 MEQ tablet TAKE 1 TABLET EVERY DAY 90 tablet 1   RESTASIS 0.05 % ophthalmic emulsion      traMADol (ULTRAM) 50 MG tablet Take 1 tablet (50 mg total) by mouth every 8 (eight) hours as needed. 90 tablet 0   valsartan (DIOVAN) 320 MG tablet Take 1 tablet (320 mg total) by mouth daily. 90 tablet 1   vitamin B-12 (CYANOCOBALAMIN) 1000 MCG tablet Take 1,000 mcg by mouth daily.     warfarin (COUMADIN) 5 MG tablet TAKE 5 MG EVERY MONDAY AND THURSDAY. TAKE 2.5 MG ALL OTHER DAYS 59 tablet 1   No facility-administered medications prior to visit.    Allergies  Allergen Reactions   Penicillins     Childhood allergy Has patient had a PCN reaction causing immediate rash, facial/tongue/throat swelling, SOB or lightheadedness with hypotension: Unknown Has patient had a PCN reaction causing severe rash involving mucus membranes or skin necrosis: Unknown Has patient had a PCN reaction that required hospitalization: Unknown Has patient had a PCN reaction occurring within the last 10 years: No If all of the above answers are "NO", then may proceed with Cephalosporin use.     Review of Systems     Objective:    Physical Exam Constitutional:      Appearance: He is well-developed.  HENT:     Head: Normocephalic and atraumatic.     Right Ear: Tympanic membrane, ear  canal and external ear normal.     Left Ear: Tympanic membrane, ear canal and external ear normal.     Nose: Nose normal.     Mouth/Throat:     Mouth: Mucous membranes are moist.     Pharynx: Posterior oropharyngeal erythema present. No oropharyngeal exudate.  Eyes:     Conjunctiva/sclera: Conjunctivae normal.     Pupils: Pupils are equal, round, and reactive to light.  Neck:     Thyroid: No thyromegaly.  Cardiovascular:     Rate and Rhythm: Normal rate.     Heart sounds: Normal heart sounds.  Pulmonary:     Effort: Pulmonary effort is normal.     Breath sounds: Normal breath sounds.  Musculoskeletal:     Cervical back: Neck supple.  Lymphadenopathy:     Cervical: No cervical adenopathy.  Skin:    General: Skin is warm and dry.  Neurological:     Mental Status: He is alert and oriented to person, place, and time.    BP (!) 149/81   Pulse 63   Temp 98.6 F (37 C)   Ht 6' 2" (1.88 m)   Wt (!) 315 lb (142.9 kg)   SpO2 95%   BMI 40.44 kg/m  Wt Readings from Last 3 Encounters:  11/01/21 (!) 315 lb (142.9 kg)  10/06/21 (!) 310 lb (140.6 kg)  09/22/21 (!) 310 lb (140.6 kg)    Health Maintenance Due  Topic Date Due   COVID-19 Vaccine (3 - Booster for Pfizer series) 04/30/2020    There are no preventive care reminders to display for this patient.   Lab Results  Component Value Date   TSH 4.16 09/08/2021   Lab Results  Component Value Date   WBC 6.3 06/13/2021   HGB 14.5 06/13/2021   HCT 42.5 06/13/2021   MCV 92.8 06/13/2021   PLT 209 06/13/2021   Lab Results  Component Value Date   NA 142 09/08/2021   K 4.5 09/08/2021   CO2 28 09/08/2021   GLUCOSE 106 (H) 09/08/2021   BUN 19 09/08/2021   CREATININE 1.04 09/08/2021   BILITOT 2.6 (H) 06/13/2021   ALKPHOS 68 04/30/2017   AST 21 06/13/2021   ALT 24 06/13/2021   PROT 6.4 06/13/2021   ALBUMIN 3.5 (L) 04/30/2017   CALCIUM 9.1 09/08/2021   ANIONGAP 6 08/02/2018   EGFR 77 09/08/2021   Lab Results   Component Value Date   CHOL 134 06/13/2021   Lab Results  Component Value Date   HDL 42 06/13/2021   Lab Results  Component Value Date   LDLCALC 76 06/13/2021   Lab Results  Component Value Date   TRIG 81 06/13/2021   Lab Results  Component Value Date   CHOLHDL 3.2 06/13/2021   Lab Results  Component Value Date   HGBA1C 4.9 10/06/2018       Assessment & Plan:   Problem List Items Addressed This Visit       Cardiovascular and Mediastinum   Atrial fibrillation ([facility])   Relevant Orders   POCT INR (Completed)   Other Visit Diagnoses     Nausea    -  Primary   Relevant Orders   Novel Coronavirus, NAA ([facility])   POCT Influenza A/B (Completed)   Generalized body aches       Relevant Orders   Novel Coronavirus, NAA ([facility])   POCT Influenza A/B (Completed)   Viral infection          Viral infection-negative for flu rapid test today.  Did send for COVID swab but that will be back until tomorrow but will let them know soon as we get it back.  Atrial fibrillation-INR at goal at 2.2 today.  Continue current regimen and repeat in 1 month.  See anticoagulation flowsheet.  No orders of the defined types were placed in this encounter.    Catherine Metheney, MD  

## 2021-11-02 LAB — NOVEL CORONAVIRUS, NAA: SARS-CoV-2, NAA: NOT DETECTED

## 2021-11-02 NOTE — Chronic Care Management (AMB) (Signed)
  Care Management   Note  11/02/2021 Name: Jerome Hicks MRN: [id number] DOB: 1951/03/25  Jerome Hicks is a 70 y.o. year old male who is a primary care patient of Hali Marry, MD and is actively engaged with the care management team. I reached out to Calvert Cantor by phone today to assist with re-scheduling a follow up visit with the Pharmacist  Follow up plan: Telephone appointment with care management team member scheduled for: 11/20/2021  Julian Hy, [place], [place] [facility]  Direct Dial: [phone]

## 2021-11-02 NOTE — Progress Notes (Signed)
Please call patient and let him know that he was negative for COVID this time which is great.  But I do think he still has a viral illness.  If he is not feeling some better by Monday then let us know.

## 2021-11-03 ENCOUNTER — Other Ambulatory Visit: Payer: Self-pay | Admitting: *Deleted

## 2021-11-03 DIAGNOSIS — I10 Essential (primary) hypertension: Secondary | ICD-10-CM

## 2021-11-03 MED ORDER — VALSARTAN 320 MG PO TABS: 320.0000 mg | ORAL_TABLET | Freq: Every day | ORAL | 1 refills | Status: DC

## 2021-11-07 ENCOUNTER — Ambulatory Visit: Payer: Medicare HMO

## 2021-11-20 ENCOUNTER — Ambulatory Visit (INDEPENDENT_AMBULATORY_CARE_PROVIDER_SITE_OTHER): Payer: Medicare HMO | Admitting: Pharmacist

## 2021-11-20 ENCOUNTER — Other Ambulatory Visit: Payer: Self-pay

## 2021-11-20 DIAGNOSIS — N1831 Chronic kidney disease, stage 3a: Secondary | ICD-10-CM

## 2021-11-20 DIAGNOSIS — I482 Chronic atrial fibrillation, unspecified: Secondary | ICD-10-CM

## 2021-11-20 DIAGNOSIS — I10 Essential (primary) hypertension: Secondary | ICD-10-CM

## 2021-11-20 NOTE — Patient Instructions (Signed)
Visit Information  Thank you for taking time to visit with me today. Please don't hesitate to contact me if I can be of assistance to you before our next scheduled telephone appointment.  Following are the goals we discussed today:  Patient Goals/Self-Care Activities Over the next 180 days, patient will:  take medications as prescribed and engage in dietary modifications by portion control & incorporating more exercise (patient-stated goal of weight loss)  Follow Up Plan: Telephone appointment with care management team member scheduled for: 6 months   Please call the care guide team at [phone] if you need to cancel or reschedule your appointment.    The patient verbalized understanding of instructions, educational materials, and care plan provided today and agreed to receive a mailed copy of patient instructions, educational materials, and care plan.   Jerome Hicks

## 2021-11-20 NOTE — Progress Notes (Signed)
Chronic Care Management Pharmacy Note  11/20/2021 Name:  Jerome Hicks MRN:  [id number] DOB:  18-Jan-1951  Summary: addressed HTN, Afib, CKD. Patient is stable with all medications and doing well.  Recommendations/Changes made from today's visit: none at this time  Plan: f/u with pharmacist in 6 months  Subjective: Jerome Hicks is an 70 y.o. year old male who is a primary patient of Metheney, Rene Kocher, MD.  The CCM team was consulted for assistance with disease management and care coordination needs.    Engaged with patient by telephone for follow up visit in response to provider referral for pharmacy case management and/or care coordination services.   Consent to Services:  The patient was given information about Chronic Care Management services, agreed to services, and gave verbal consent prior to initiation of services.  Please see initial visit note for detailed documentation.   Patient Care Team: Hali Marry, MD as PCP - General Darius Bump, [facility] as Pharmacist (Pharmacist)  Objective:  Lab Results  Component Value Date   CREATININE 1.04 09/08/2021   CREATININE 0.94 06/13/2021   CREATININE 0.92 06/23/2020       Component Value Date/Time   CHOL 134 06/13/2021 0000   TRIG 81 06/13/2021 0000   HDL 42 06/13/2021 0000   CHOLHDL 3.2 06/13/2021 0000   VLDL 9 07/01/2015 1429   LDLCALC 76 06/13/2021 0000    Hepatic Function Latest Ref Rng & Units 06/13/2021 06/23/2020 01/07/2020  Total Protein 6.1 - 8.1 g/dL 6.4 6.4 6.2  Albumin 3.6 - 5.1 g/dL - - -  AST 10 - 35 U/L _0 ALT 9 - 46 U/L _1 Alk Phosphatase 40 - 115 U/L - - -  Total Bilirubin 0.2 - 1.2 mg/dL 2.6(H) 3.3(H) 3.0(H)  Bilirubin, Direct 0.0 - 0.3 mg/dL - - -    Lab Results  Component Value Date/Time   TSH 4.16 09/08/2021 12:00 AM   TSH 3.18 04/30/2017 08:43 AM    CBC Latest Ref Rng & Units 06/13/2021 06/23/2020 01/07/2020  WBC 3.8 - 10.8 Thousand/uL 6.3 6.4 6.3  Hemoglobin  13.2 - 17.1 g/dL 14.5 14.5 14.0  Hematocrit 38.5 - 50.0 % 42.5 42.7 41.5  Platelets 140 - 400 Thousand/uL 209 211 194   Social History   Tobacco Use  Smoking Status Never  Smokeless Tobacco Former   Types: Chew   Quit date: 05/21/2021   BP Readings from Last 3 Encounters:  11/01/21 (!) 149/81  10/06/21 139/73  09/22/21 136/69   Pulse Readings from Last 3 Encounters:  11/01/21 63  10/06/21 75  09/22/21 61   Wt Readings from Last 3 Encounters:  11/01/21 (!) 315 lb (142.9 kg)  10/06/21 (!) 310 lb (140.6 kg)  09/22/21 (!) 310 lb (140.6 kg)    Assessment: Review of patient past medical history, allergies, medications, health status, including review of consultants reports, laboratory and other test data, was performed as part of comprehensive evaluation and provision of chronic care management services.   SDOH:  (Social Determinants of Health) assessments and interventions performed:    CCM Care Plan  Allergies  Allergen Reactions   Penicillins     Childhood allergy Has patient had a PCN reaction causing immediate rash, facial/tongue/throat swelling, SOB or lightheadedness with hypotension: Unknown Has patient had a PCN reaction causing severe rash involving mucus membranes or skin necrosis: Unknown Has patient had a PCN reaction that required hospitalization: Unknown Has patient had a PCN  reaction occurring within the last 10 years: No If all of the above answers are "NO", then may proceed with Cephalosporin use.     Medications Reviewed Today     Reviewed by Darius Bump, [facility] (Pharmacist) on 11/20/21 at 1008  Med List Status: <None>   Medication Order Taking? Sig Documenting Provider Last Dose Status Informant  Carboxymethylcellul-Glycerin (LUBRICATING EYE DROPS OP) [id number] Yes Place 1 drop into both eyes daily as needed (dry eyes). [provider] Taking Active Self  carvedilol (COREG) 25 MG tablet [id number] Yes Take 1 tablet (25 mg total) by mouth 2  (two) times daily with a meal. Hali Marry, MD Taking Active   clotrimazole-betamethasone Donalynn Furlong) cream [id number] Yes Apply 1 application topically daily as needed (rash). Dont use for more than 2 weeks at a time Hali Marry, MD Taking Active   doxazosin (CARDURA) 4 MG tablet [id number] Yes TAKE 1 TABLET EVERY DAY Hali Marry, MD Taking Active   furosemide (LASIX) 40 MG tablet [id number] Yes TAKE 1 TO 2 TABLETS DAILY AS NEEDED FOR EDEMA. Hali Marry, MD Taking Active            Med Note Tonny Bollman Apr 27, 2021  9:03 AM) Just 1 tablet PRN, maybe once every 2 weeks   meclizine (ANTIVERT) 25 MG tablet [id number] Yes TAKE 1 TABLET THREE TIMES DAILY AS NEEDED  FOR  DIZZINESS  OR  NAUSEA  AS DIRECTED Hali Marry, MD Taking Active   Multiple Vitamin (MULTIVITAMIN WITH MINERALS) TABS tablet [id number] Yes Take 1 tablet by mouth daily. [provider] Taking Active Self  potassium chloride (KLOR-CON) 10 MEQ tablet [id number] Yes TAKE 1 TABLET EVERY DAY Hali Marry, MD Taking Active   RESTASIS 0.05 % ophthalmic emulsion [id number] Yes  [provider] Taking Active   traMADol (ULTRAM) 50 MG tablet [id number] Yes Take 1 tablet (50 mg total) by mouth every 8 (eight) hours as needed. Hali Marry, MD Taking Active   valsartan (DIOVAN) 320 MG tablet [id number] Yes Take 1 tablet (320 mg total) by mouth daily. Hali Marry, MD Taking Active   vitamin B-12 (CYANOCOBALAMIN) 1000 MCG tablet [id number] Yes Take 1,000 mcg by mouth daily. [provider] Taking Active Self  warfarin (COUMADIN) 5 MG tablet [id number] Yes TAKE 5 MG EVERY MONDAY AND THURSDAY. TAKE 2.5 MG ALL OTHER DAYS Hali Marry, MD Taking Active             Patient Active Problem List   Diagnosis Date Noted   Coronary artery disease due to calcified coronary lesion 03/10/2019   Total knee replacement status 08/02/2018    Thoracic aortic ectasia ([place]) 02/06/2018   History of arthroplasty of right knee 10/21/2017   BMI 37.0-37.9, adult 04/27/2016   Glenohumeral arthritis of both shoulders 04/14/2015   Gilbert's disease 08/24/2014   Hypogonadism male 02/26/2014   Lower leg edema 12/02/2013   Lymph node enlargement 12/02/2013   CKD (chronic kidney disease) stage 3, GFR 30-59 ml/min (HCC) 05/28/2013   Atrial fibrillation ([place]) 06/25/2012   Asbestosis ([place]) 01/12/2011   Restrictive lung disease 01/12/2011   ERECTILE DYSFUNCTION 01/06/2008   ESSENTIAL HYPERTENSION, BENIGN 01/05/2008   Venous (peripheral) insufficiency 01/05/2008    Immunization History  Administered Date(s) Administered   Fluad Quad(high Dose 65+) 08/24/2020, 09/08/2021   Influenza Split 08/21/2012   Influenza Whole 08/27/2008, 11/07/2009, 10/18/2010, 08/20/2011   Influenza, High Dose Seasonal PF 08/26/2017, 08/08/2018, 08/03/2019  Influenza,inj,Quad PF,6+ Mos 08/25/2013, 08/05/2014, 08/31/2015, 08/28/2016   PFIZER(Purple Top)SARS-COV-2 Vaccination 02/12/2020, 03/05/2020   Pneumococcal Conjugate-13 09/29/2013   Pneumococcal Polysaccharide-23 07/14/2015   Td 12/03/1994, 06/15/2009   Zoster Recombinat (Shingrix) 06/07/2018, 08/21/2018   Zoster, Live 06/18/2011    Conditions to be addressed/monitored: Atrial Fibrillation and HTN  Care Plan : Medication Management  Updates made by Darius Bump, [place] since 11/20/2021 12:00 AM     Problem: Afib, HTN, CKD3      Long-Range Goal: Disease Progression Prevention   Note:   Current Barriers:  none at present  Pharmacist Clinical Goal(s):  Over the next 180 days, patient will maintain control of BP, Afib as evidenced by home BP/HR readings, taking medication as prescribed  through collaboration with PharmD and provider.   Interventions: 1:1 collaboration with Hali Marry, MD regarding development and update of comprehensive plan of care as evidenced by provider attestation  and co-signature Inter-disciplinary care team collaboration (see longitudinal plan of care) Comprehensive medication review performed; medication list updated in electronic medical record  Hypertension:  Controlled; current treatment: carvedilol 6.38m BID, doxazosin 493mdaily, lisinopril 4013maily, furosemide 54m39mily;   Current home readings: on a good day = 138/83, on a bad day = 148/90s   Denies hypotensive/hypertensive symptoms  Recommended continue current medications,  Atrial Fibrillation:  Controlled; current rate/rhythm control carvedilol; anticoagulant treatment: warfarin  CHADS2VASc score: 3 (3.2% annual stroke risk)  Previously ducated on stroke risk, HR control (through carvedilol), and s/sx of bleeding, though patient does very well on time in therapeutic range (TITR).  Also discussed diet & that patient is allowed to eat leafy greens/vitamin K containing foods, but that the KEY is consistency (I.e. not 8 salads one day and none the next) Recommended continue current medications, and  CKD Stage 3  Medications evaluated for renal function safety/efficacy  No concerns, continue current regimen  Patient Goals/Self-Care Activities Over the next 180 days, patient will:  take medications as prescribed and engage in dietary modifications by portion control & incorporating more exercise (patient-stated goal of weight loss)  Follow Up Plan: Telephone appointment with care management team member scheduled for: 6 months      Medication Assistance: None required.  Patient affirms current coverage meets needs.  [facility] pharmacy is:  [facility] [id number]: [phone]: [phone]: [phone]: [phone] Up:  Patient agrees to  Care Plan and Follow-up.  Plan: Telephone follow up appointment with care management team member scheduled for:  6 months  KeesLarinda ButteryarmD Clinical Pharmacist [facility]-[phone]

## 2021-12-01 ENCOUNTER — Other Ambulatory Visit: Payer: Self-pay | Admitting: Family Medicine

## 2021-12-01 DIAGNOSIS — I10 Essential (primary) hypertension: Secondary | ICD-10-CM

## 2021-12-02 DIAGNOSIS — N1831 Chronic kidney disease, stage 3a: Secondary | ICD-10-CM

## 2021-12-02 DIAGNOSIS — I482 Chronic atrial fibrillation, unspecified: Secondary | ICD-10-CM | POA: Diagnosis not present

## 2021-12-02 DIAGNOSIS — I10 Essential (primary) hypertension: Secondary | ICD-10-CM | POA: Diagnosis not present

## 2021-12-05 ENCOUNTER — Ambulatory Visit: Payer: Medicare HMO

## 2021-12-05 ENCOUNTER — Ambulatory Visit: Payer: Self-pay | Admitting: Family Medicine

## 2021-12-05 ENCOUNTER — Ambulatory Visit (INDEPENDENT_AMBULATORY_CARE_PROVIDER_SITE_OTHER): Payer: Medicare HMO | Admitting: Family Medicine

## 2021-12-05 ENCOUNTER — Encounter: Payer: Self-pay | Admitting: Family Medicine

## 2021-12-05 ENCOUNTER — Other Ambulatory Visit: Payer: Self-pay

## 2021-12-05 ENCOUNTER — Ambulatory Visit (INDEPENDENT_AMBULATORY_CARE_PROVIDER_SITE_OTHER): Payer: Medicare HMO

## 2021-12-05 VITALS — BP 147/81 | HR 98 | Temp 97.9°F | Resp 18 | Ht 74.0 in | Wt 319.1 lb

## 2021-12-05 DIAGNOSIS — I872 Venous insufficiency (chronic) (peripheral): Secondary | ICD-10-CM | POA: Diagnosis not present

## 2021-12-05 DIAGNOSIS — R0602 Shortness of breath: Secondary | ICD-10-CM | POA: Diagnosis not present

## 2021-12-05 DIAGNOSIS — I482 Chronic atrial fibrillation, unspecified: Secondary | ICD-10-CM

## 2021-12-05 DIAGNOSIS — I420 Dilated cardiomyopathy: Secondary | ICD-10-CM | POA: Diagnosis not present

## 2021-12-05 DIAGNOSIS — Z6841 Body Mass Index (BMI) 40.0 and over, adult: Secondary | ICD-10-CM | POA: Diagnosis not present

## 2021-12-05 DIAGNOSIS — I10 Essential (primary) hypertension: Secondary | ICD-10-CM

## 2021-12-05 DIAGNOSIS — I4891 Unspecified atrial fibrillation: Secondary | ICD-10-CM

## 2021-12-05 LAB — POCT INR: INR: 2.5 (ref 2.0–3.0)

## 2021-12-05 MED ORDER — FUROSEMIDE 10 MG/ML IJ SOLN
80.0000 mg | Freq: Once | INTRAMUSCULAR | Status: AC
  Administered 2021-12-05: 80 mg via INTRAMUSCULAR

## 2021-12-05 NOTE — Assessment & Plan Note (Addendum)
INR looks great today at 2.5.  See anticoagulation flowsheet.  Continue current regimen and plan to recheck in 1 month.

## 2021-12-05 NOTE — Assessment & Plan Note (Signed)
Uncontrolled today.  We really need to get a systolic less than 953 because of some LVH on his last echo.  For now we will try to work on getting some fluid pulled off and see if that makes a difference he is also planning on really changing his diet.

## 2021-12-05 NOTE — Progress Notes (Signed)
See flow sheet

## 2021-12-05 NOTE — Patient Instructions (Signed)
Limit your fluid intake to no more than 50 ounces total per day that is for all fluids. Increase your Lasix to 2 tabs daily. A morning you can take the wrap off and shower.

## 2021-12-05 NOTE — Progress Notes (Signed)
Call patient: No sign of pneumonia etc.  I do think once we get some of the fluid off and really encourage him to cut back in regards to his diet.  Cutting out sweets and carbs I think he will start breathing better.

## 2021-12-05 NOTE — Progress Notes (Signed)
Acute Office Visit  Subjective:    Patient ID: Jerome Hicks, male    DOB: 08-Aug-1951, 70 y.o.   MRN: [id number]  Chief Complaint  Patient presents with   Follow-up    Coumadin    Blisters on legs     2 weeks. Bilateral. Worse on the right leg    Shortness of Breath    2 months. Getting worse. Patient states he is short of breath after walking at short distance     HPI Patient is in today for blisters on legs.  2 weeks. Bilateral. Worse on the right leg. His weight is up 5 lbs. he takes his Lasix as needed but started taking it daily last week.  He feels like it is effective it definitely increases his urinary frequency.  He says he has been avoiding salt.  He has gained some weight but says that he and his girlfriend just started getting back on track with a diet yesterday.  SOB x 2 months getting worse.  Patient states he is short of breath after walking at short distance.  He says he has not had any other cold symptoms no cough or chest congestion just feels short of breath with achy when he gets up and moves around  F/U Afib - due for INR check today.    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)    get yearly PFT's   Ascending aortic aneurysm    seen on 01-31-18 ct chest ; patient denies awareness   Atrial fibrillation ([place])    Cataracts, bilateral    ED (erectile dysfunction)    has been prescribed Levitra and Viagrain in the past   Hypertension    Internal hemorrhoids    Kidney stones    Leg edema    recurrent cellulitis   Scrotal abscess 3-03   Shoulder arthritis    Sigmoid diverticulosis     Past Surgical History:  Procedure Laterality Date    MRSA infection in left groin requiring surgical debridement  12-08   CHOLECYSTECTOMY  1971   kidney stones  1990's   Ureteroscopic stone extraction   TONSILLECTOMY     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   Procedure: RIGHT TOTAL KNEE ARTHROPLASTY;  Surgeon: Dorna Leitz, MD;   Location: [facility] ORS;  Service: Orthopedics;  Laterality: Right;    Family History  Problem Relation Age of Onset   Heart attack Mother 66   Heart attack Father 59   Heart attack Sister 52   Heart attack Brother 57   Multiple sclerosis Daughter     Social History   Socioeconomic History   Marital status: Married    Spouse name: Becky   Number of children: 2   Years of education: 14   Highest education level: Associate degree: occupational, Hotel manager, or vocational program  Occupational History   Occupation: On disability     Comment: retired  Tobacco Use   Smoking status: Never   Smokeless tobacco: Former    Types: Chew    Quit date: 05/21/2021  Vaping Use   Vaping Use: Never used  Substance and Sexual Activity   Alcohol use: No   Drug use: No   Sexual activity: Not Currently  Other Topics Concern   Not on file  Social History Narrative   Lives with his wife. He has two children. He enjoys being outside and working with wood.   Social Determinants of Health  Financial Resource Strain: Low Risk    Difficulty of Paying Living Expenses: Not hard at all  Food Insecurity: No Food Insecurity   Worried About Charity fundraiser in the Last Year: Never true   Ran Out of Food in the Last Year: Never true  Transportation Needs: No Transportation Needs   Lack of Transportation (Medical): No   Lack of Transportation (Non-Medical): No  Physical Activity: Inactive   Days of Exercise per Week: 0 days   Minutes of Exercise per Session: 0 min  Stress: No Stress Concern Present   Feeling of Stress : Not at all  Social Connections: Moderately Isolated   Frequency of Communication with Friends and Family: Three times a week   Frequency of Social Gatherings with Friends and Family: Once a week   Attends Religious Services: Never   Marine scientist or Organizations: No   Attends Music therapist: Never   Marital Status: Married  Human resources officer Violence: Not At  Risk   Fear of Current or Ex-Partner: No   Emotionally Abused: No   Physically Abused: No   Sexually Abused: No    Outpatient Medications Prior to Visit  Medication Sig Dispense Refill   Carboxymethylcellul-Glycerin (LUBRICATING EYE DROPS OP) Place 1 drop into both eyes daily as needed (dry eyes).     carvedilol (COREG) 25 MG tablet TAKE 1 TABLET BY MOUTH TWICE DAILY WITH A MEAL 60 tablet 0   clotrimazole-betamethasone (LOTRISONE) cream Apply 1 application topically daily as needed (rash). Dont use for more than 2 weeks at a time 45 g 1   doxazosin (CARDURA) 4 MG tablet TAKE 1 TABLET EVERY DAY 90 tablet 3   furosemide (LASIX) 40 MG tablet TAKE 1 TO 2 TABLETS DAILY AS NEEDED FOR EDEMA. 180 tablet 0   meclizine (ANTIVERT) 25 MG tablet TAKE 1 TABLET THREE TIMES DAILY AS NEEDED  FOR  DIZZINESS  OR  NAUSEA  AS DIRECTED 90 tablet 1   Multiple Vitamin (MULTIVITAMIN WITH MINERALS) TABS tablet Take 1 tablet by mouth daily.     potassium chloride (KLOR-CON) 10 MEQ tablet TAKE 1 TABLET EVERY DAY 90 tablet 1   RESTASIS 0.05 % ophthalmic emulsion      traMADol (ULTRAM) 50 MG tablet Take 1 tablet (50 mg total) by mouth every 8 (eight) hours as needed. 90 tablet 0   valsartan (DIOVAN) 320 MG tablet Take 1 tablet (320 mg total) by mouth daily. 90 tablet 1   vitamin B-12 (CYANOCOBALAMIN) 1000 MCG tablet Take 1,000 mcg by mouth daily.     warfarin (COUMADIN) 5 MG tablet TAKE 5 MG EVERY MONDAY AND THURSDAY. TAKE 2.5 MG ALL OTHER DAYS 59 tablet 1   No facility-administered medications prior to visit.    Allergies  Allergen Reactions   Penicillins     Childhood allergy Has patient had a PCN reaction causing immediate rash, facial/tongue/throat swelling, SOB or lightheadedness with hypotension: Unknown Has patient had a PCN reaction causing severe rash involving mucus membranes or skin necrosis: Unknown Has patient had a PCN reaction that required hospitalization: Unknown Has patient had a PCN reaction  occurring within the last 10 years: No If all of the above answers are "NO", then may proceed with Cephalosporin use.     Review of Systems     Objective:    Physical Exam Constitutional:      Appearance: He is well-developed.  HENT:     Head: Normocephalic and atraumatic.  Cardiovascular:  Rate and Rhythm: Normal rate and regular rhythm.     Heart sounds: Normal heart sounds.  Pulmonary:     Effort: Pulmonary effort is normal.     Breath sounds: Normal breath sounds.  Musculoskeletal:     Comments: He has pretty thickened skin over his lower extremities with some chronic venous stasis dermatitis.  He has 1-2+ edema on both lower extremities and on his right posterior leg has blisters that are weeping a clear serous fluid.  Skin:    General: Skin is warm and dry.  Neurological:     Mental Status: He is alert and oriented to person, place, and time.  Psychiatric:        Behavior: Behavior normal.    BP (!) 147/81    Pulse 98    Temp 97.9 F (36.6 C)    Resp 18    Ht _0  (1.88 m)    Wt (!) 319 lb 1.9 oz (144.8 kg)    SpO2 97%    BMI 40.97 kg/m  Wt Readings from Last 3 Encounters:  12/05/21 (!) 319 lb 1.9 oz (144.8 kg)  11/01/21 (!) 315 lb (142.9 kg)  10/06/21 (!) 310 lb (140.6 kg)    Health Maintenance Due  Topic Date Due   COVID-19 Vaccine (3 - Booster for Pfizer series) 04/30/2020    There are no preventive care reminders to display for this patient.   Lab Results  Component Value Date   TSH 4.16 09/08/2021   Lab Results  Component Value Date   WBC 6.3 06/13/2021   HGB 14.5 06/13/2021   HCT 42.5 06/13/2021   MCV 92.8 06/13/2021   PLT 209 06/13/2021   Lab Results  Component Value Date   NA 142 09/08/2021   K 4.5 09/08/2021   CO2 28 09/08/2021   GLUCOSE 106 (H) 09/08/2021   BUN 19 09/08/2021   CREATININE 1.04 09/08/2021   BILITOT 2.6 (H) 06/13/2021   ALKPHOS 68 04/30/2017   AST 21 06/13/2021   ALT 24 06/13/2021   PROT 6.4 06/13/2021    ALBUMIN 3.5 (L) 04/30/2017   CALCIUM 9.1 09/08/2021   ANIONGAP 6 08/02/2018   EGFR 77 09/08/2021   Lab Results  Component Value Date   CHOL 134 06/13/2021   Lab Results  Component Value Date   HDL 42 06/13/2021   Lab Results  Component Value Date   LDLCALC 76 06/13/2021   Lab Results  Component Value Date   TRIG 81 06/13/2021   Lab Results  Component Value Date   CHOLHDL 3.2 06/13/2021   Lab Results  Component Value Date   HGBA1C 4.9 10/06/2018       Assessment & Plan:   Problem List Items Addressed This Visit       Cardiovascular and Mediastinum   Venous (peripheral) insufficiency    Active swelling and weeping of the leg recommend an Unna boot on that right leg and then follow-up in 3 days.  We will check the leg again and see if it is doing better and if we can get some of the fluid and volume off I want a try to do this to prevent any secondary infection he was concerned and thought that he might benefit from antibiotic but today I did not see any indication for antibiotics.  But if anything changes I did encourage him to give [place] call back sooner and otherwise we can make a judgment when I see him on Friday if I do feel  like he needs antibiotics at that point in time.      ESSENTIAL HYPERTENSION, BENIGN    Uncontrolled today.  We really need to get a systolic less than 189 because of some LVH on his last echo.  For now we will try to work on getting some fluid pulled off and see if that makes a difference he is also planning on really changing his diet.      Atrial fibrillation ([place]) - Primary    INR looks great today at 2.5.  See anticoagulation flowsheet.  Continue current regimen and plan to recheck in 1 month.      Relevant Orders   POCT INR (Completed)   Other Visit Diagnoses     SOB (shortness of breath)       Relevant Medications   furosemide (LASIX) injection 80 mg (Completed)   Other Relevant Orders   DG Chest 2 View      Shortness of breath  x2 months-lungs are clear on exam.  We will get chest x-ray today.  I suspect it is probably related to increased volume overload.  Unna boot placed on the right leg.  Patient tolerated well.  Meds ordered this encounter  Medications   furosemide (LASIX) injection 80 mg     Beatrice Lecher, MD

## 2021-12-05 NOTE — Assessment & Plan Note (Signed)
Active swelling and weeping of the leg recommend an Unna boot on that right leg and then follow-up in 3 days.  We will check the leg again and see if it is doing better and if we can get some of the fluid and volume off I want a try to do this to prevent any secondary infection he was concerned and thought that he might benefit from antibiotic but today I did not see any indication for antibiotics.  But if anything changes I did encourage him to give [place] call back sooner and otherwise we can make a judgment when I see him on Friday if I do feel like he needs antibiotics at that point in time.

## 2021-12-08 ENCOUNTER — Ambulatory Visit (INDEPENDENT_AMBULATORY_CARE_PROVIDER_SITE_OTHER): Payer: Medicare HMO | Admitting: Family Medicine

## 2021-12-08 ENCOUNTER — Other Ambulatory Visit: Payer: Self-pay

## 2021-12-08 ENCOUNTER — Encounter: Payer: Self-pay | Admitting: Family Medicine

## 2021-12-08 VITALS — BP 139/67 | HR 57 | Resp 18 | Ht 74.0 in | Wt 311.0 lb

## 2021-12-08 DIAGNOSIS — I872 Venous insufficiency (chronic) (peripheral): Secondary | ICD-10-CM

## 2021-12-08 DIAGNOSIS — L97811 Non-pressure chronic ulcer of other part of right lower leg limited to breakdown of skin: Secondary | ICD-10-CM

## 2021-12-08 MED ORDER — DOXYCYCLINE HYCLATE 100 MG PO TABS: 100.0000 mg | ORAL_TABLET | Freq: Two times a day (BID) | ORAL | 0 refills | Status: DC

## 2021-12-08 NOTE — Addendum Note (Signed)
Addended by: Beatrice Lecher D on: 12/08/2021 03:38 PM   Modules accepted: Orders

## 2021-12-08 NOTE — Progress Notes (Addendum)
Acute Office Visit  Subjective:    Patient ID: Jerome Hicks, male    DOB: 12/07/1950, 71 y.o.   MRN: [id number]  Chief Complaint  Patient presents with   Follow-up    Blisters on legs/some improvement.   Shortness of Breath    Improved since Lasix injections.     HPI Patient is in today for swelling.  He feels like his shortness of breath is improved.  He is down 8 pounds.  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)    get yearly PFT's   Ascending aortic aneurysm    seen on 01-31-18 ct chest ; patient denies awareness   Atrial fibrillation ([place])    Cataracts, bilateral    ED (erectile dysfunction)    has been prescribed Levitra and Viagrain in the past   Hypertension    Internal hemorrhoids    Kidney stones    Leg edema    recurrent cellulitis   Scrotal abscess 3-03   Shoulder arthritis    Sigmoid diverticulosis     Past Surgical History:  Procedure Laterality Date    MRSA infection in left groin requiring surgical debridement  12-08   CHOLECYSTECTOMY  1971   kidney stones  1990's   Ureteroscopic stone extraction   TONSILLECTOMY     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   Procedure: RIGHT TOTAL KNEE ARTHROPLASTY;  Surgeon: Dorna Leitz, MD;  Location: [facility];  Service: Orthopedics;  Laterality: Right;    Family History  Problem Relation Age of Onset   Heart attack Mother 50   Heart attack Father 57   Heart attack Sister 92   Heart attack Brother 44   Multiple sclerosis Daughter     Social History   Socioeconomic History   Marital status: Married    Spouse name: Becky   Number of children: 2   Years of education: 14   Highest education level: Associate degree: occupational, Hotel manager, or vocational program  Occupational History   Occupation: On disability     Comment: retired  Tobacco Use   Smoking status: Never   Smokeless tobacco: Former    Types: Chew    Quit date: 05/21/2021  Vaping Use   Vaping Use:  Never used  Substance and Sexual Activity   Alcohol use: No   Drug use: No   Sexual activity: Not Currently  Other Topics Concern   Not on file  Social History Narrative   Lives with his wife. He has two children. He enjoys being outside and working with wood.   Social Determinants of Health   Financial Resource Strain: Low Risk    Difficulty of Paying Living Expenses: Not hard at all  Food Insecurity: No Food Insecurity   Worried About Charity fundraiser in the Last Year: Never true   [place] in the Last Year: Never true  Transportation Needs: No Transportation Needs   Lack of Transportation (Medical): No   Lack of Transportation (Non-Medical): No  Physical Activity: Inactive   Days of Exercise per Week: 0 days   Minutes of Exercise per Session: 0 min  Stress: No Stress Concern Present   Feeling of Stress : Not at all  Social Connections: Moderately Isolated   Frequency of Communication with Friends and Family: Three times a week   Frequency of Social Gatherings with Friends and Family: Once a week   Attends Religious Services: Never   Active  Member of Clubs or Organizations: No   Attends Music therapist: Never   Marital Status: Married  Human resources officer Violence: Not At Risk   Fear of Current or Ex-Partner: No   Emotionally Abused: No   Physically Abused: No   Sexually Abused: No    Outpatient Medications Prior to Visit  Medication Sig Dispense Refill   Carboxymethylcellul-Glycerin (LUBRICATING EYE DROPS OP) Place 1 drop into both eyes daily as needed (dry eyes).     carvedilol (COREG) 25 MG tablet TAKE 1 TABLET BY MOUTH TWICE DAILY WITH A MEAL 60 tablet 0   clotrimazole-betamethasone (LOTRISONE) cream Apply 1 application topically daily as needed (rash). Dont use for more than 2 weeks at a time 45 g 1   doxazosin (CARDURA) 4 MG tablet TAKE 1 TABLET EVERY DAY 90 tablet 3   furosemide (LASIX) 40 MG tablet TAKE 1 TO 2 TABLETS DAILY AS NEEDED FOR  EDEMA. 180 tablet 0   meclizine (ANTIVERT) 25 MG tablet TAKE 1 TABLET THREE TIMES DAILY AS NEEDED  FOR  DIZZINESS  OR  NAUSEA  AS DIRECTED 90 tablet 1   Multiple Vitamin (MULTIVITAMIN WITH MINERALS) TABS tablet Take 1 tablet by mouth daily.     potassium chloride (KLOR-CON) 10 MEQ tablet TAKE 1 TABLET EVERY DAY 90 tablet 1   RESTASIS 0.05 % ophthalmic emulsion      traMADol (ULTRAM) 50 MG tablet Take 1 tablet (50 mg total) by mouth every 8 (eight) hours as needed. 90 tablet 0   valsartan (DIOVAN) 320 MG tablet Take 1 tablet (320 mg total) by mouth daily. 90 tablet 1   vitamin B-12 (CYANOCOBALAMIN) 1000 MCG tablet Take 1,000 mcg by mouth daily.     warfarin (COUMADIN) 5 MG tablet TAKE 5 MG EVERY MONDAY AND THURSDAY. TAKE 2.5 MG ALL OTHER DAYS 59 tablet 1   No facility-administered medications prior to visit.    Allergies  Allergen Reactions   Penicillins     Childhood allergy Has patient had a PCN reaction causing immediate rash, facial/tongue/throat swelling, SOB or lightheadedness with hypotension: Unknown Has patient had a PCN reaction causing severe rash involving mucus membranes or skin necrosis: Unknown Has patient had a PCN reaction that required hospitalization: Unknown Has patient had a PCN reaction occurring within the last 10 years: No If all of the above answers are "NO", then may proceed with Cephalosporin use.     Review of Systems     Objective:    Physical Exam Vitals reviewed.  Constitutional:      Appearance: He is well-developed.  HENT:     Head: Normocephalic and atraumatic.  Eyes:     Conjunctiva/sclera: Conjunctivae normal.  Cardiovascular:     Rate and Rhythm: Normal rate.  Pulmonary:     Effort: Pulmonary effort is normal.  Skin:    General: Skin is dry.     Coloration: Skin is not pale.  Neurological:     Mental Status: He is alert and oriented to person, place, and time.  Psychiatric:        Behavior: Behavior normal.    Larger wound on the  posterior calf measures approximately 4 x 4-1/2 cm.  The smaller 1 which is closer to the knee measures 1-1/2 cm and is more round in shape.     BP 139/67    Pulse (!) 57    Resp 18    Ht _0  (1.88 m)    Wt (!) 311 lb (141.1 kg)  SpO2 97%    BMI 39.93 kg/m  Wt Readings from Last 3 Encounters:  12/08/21 (!) 311 lb (141.1 kg)  12/05/21 (!) 319 lb 1.9 oz (144.8 kg)  11/01/21 (!) 315 lb (142.9 kg)    Health Maintenance Due  Topic Date Due   COVID-19 Vaccine (3 - Booster for Pfizer series) 04/30/2020    There are no preventive care reminders to display for this patient.   Lab Results  Component Value Date   TSH 4.16 09/08/2021   Lab Results  Component Value Date   WBC 6.3 06/13/2021   HGB 14.5 06/13/2021   HCT 42.5 06/13/2021   MCV 92.8 06/13/2021   PLT 209 06/13/2021   Lab Results  Component Value Date   NA 142 09/08/2021   K 4.5 09/08/2021   CO2 28 09/08/2021   GLUCOSE 106 (H) 09/08/2021   BUN 19 09/08/2021   CREATININE 1.04 09/08/2021   BILITOT 2.6 (H) 06/13/2021   ALKPHOS 68 04/30/2017   AST 21 06/13/2021   ALT 24 06/13/2021   PROT 6.4 06/13/2021   ALBUMIN 3.5 (L) 04/30/2017   CALCIUM 9.1 09/08/2021   ANIONGAP 6 08/02/2018   EGFR 77 09/08/2021   Lab Results  Component Value Date   CHOL 134 06/13/2021   Lab Results  Component Value Date   HDL 42 06/13/2021   Lab Results  Component Value Date   LDLCALC 76 06/13/2021   Lab Results  Component Value Date   TRIG 81 06/13/2021   Lab Results  Component Value Date   CHOLHDL 3.2 06/13/2021   Lab Results  Component Value Date   HGBA1C 4.9 10/06/2018       Assessment & Plan:   Problem List Items Addressed This Visit       Cardiovascular and Mediastinum   Venous (peripheral) insufficiency - Primary   Other Visit Diagnoses     Venous stasis ulcer of other part of right lower leg limited to breakdown of skin without varicose veins (HCC)          Venous stasis ulcer-if you feel like we  have made a lot of progress.  He is actually down about 8 pounds on our scale he says about 13 pounds on his home scale.  Continue with twice Lasix twice a day for at least another week I still feel like he has some more edema that we need to pull off.  It is really encouraged him to work on low-salt diet.  Will wrap again with an Unna boot since there is still a large open area.  Return on Monday for recheck of wound.  Okay to place on nurse schedule.    Meds ordered this encounter  Medications   doxycycline (VIBRA-TABS) 100 MG tablet    Sig: Take 1 tablet (100 mg total) by mouth 2 (two) times daily.    Dispense:  10 tablet    Refill:  0     Beatrice Lecher, MD

## 2021-12-11 ENCOUNTER — Ambulatory Visit (INDEPENDENT_AMBULATORY_CARE_PROVIDER_SITE_OTHER): Payer: Medicare HMO | Admitting: Family Medicine

## 2021-12-11 ENCOUNTER — Encounter: Payer: Self-pay | Admitting: Family Medicine

## 2021-12-11 ENCOUNTER — Other Ambulatory Visit: Payer: Self-pay

## 2021-12-11 VITALS — BP 132/78 | HR 65 | Resp 18 | Ht 74.0 in | Wt 305.0 lb

## 2021-12-11 DIAGNOSIS — L97811 Non-pressure chronic ulcer of other part of right lower leg limited to breakdown of skin: Secondary | ICD-10-CM

## 2021-12-11 DIAGNOSIS — I872 Venous insufficiency (chronic) (peripheral): Secondary | ICD-10-CM

## 2021-12-11 MED ORDER — MUPIROCIN 2 % EX OINT: TOPICAL_OINTMENT | Freq: Two times a day (BID) | CUTANEOUS | 0 refills | Status: DC | PRN

## 2021-12-11 NOTE — Progress Notes (Signed)
Acute Office Visit  Subjective:    Patient ID: Jerome Hicks, male    DOB: 26-Nov-1951, 71 y.o.   MRN: [id number]  Chief Complaint  Patient presents with   Follow-up    Swelling/Blisters on Legs improving     HPI Patient is in today for follow-up venous stasis ulcer on posterior right calf.  He says overall he feels like it is getting better.  He did have the Unna boot on through the weekend and took it off today.  He feels like his swelling has gone down significantly and in fact his weight is down about 6 pounds which is fantastic.  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)    get yearly PFT's   Ascending aortic aneurysm    seen on 01-31-18 ct chest ; patient denies awareness   Atrial fibrillation ([place])    Cataracts, bilateral    ED (erectile dysfunction)    has been prescribed Levitra and Viagrain in the past   Hypertension    Internal hemorrhoids    Kidney stones    Leg edema    recurrent cellulitis   Scrotal abscess 3-03   Shoulder arthritis    Sigmoid diverticulosis     Past Surgical History:  Procedure Laterality Date    MRSA infection in left groin requiring surgical debridement  12-08   CHOLECYSTECTOMY  1971   kidney stones  1990's   Ureteroscopic stone extraction   TONSILLECTOMY     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   Procedure: RIGHT TOTAL KNEE ARTHROPLASTY;  Surgeon: Dorna Leitz, MD;  Location: [facility];  Service: Orthopedics;  Laterality: Right;    Family History  Problem Relation Age of Onset   Heart attack Mother 56   Heart attack Father 35   Heart attack Sister 64   Heart attack Brother 30   Multiple sclerosis Daughter     Social History   Socioeconomic History   Marital status: Married    Spouse name: Becky   Number of children: 2   Years of education: 14   Highest education level: Associate degree: occupational,  Hotel manager, or vocational program  Occupational History   Occupation: On disability     Comment: retired  Tobacco Use   Smoking status: Never   Smokeless tobacco: Former    Types: Chew    Quit date: 05/21/2021  Vaping Use   Vaping Use: Never used  Substance and Sexual Activity   Alcohol use: No   Drug use: No   Sexual activity: Not Currently  Other Topics Concern   Not on file  Social History Narrative   Lives with his wife. He has two children. He enjoys being outside and working with wood.   Social Determinants of Health   Financial Resource Strain: Low Risk    Difficulty of Paying Living Expenses: Not hard at all  Food Insecurity: No Food Insecurity   Worried About Charity fundraiser in the Last Year: Never true   [place] in the Last Year: Never true  Transportation Needs: No Transportation Needs   Lack of Transportation (Medical): No   Lack of Transportation (Non-Medical): No  Physical Activity: Inactive   Days of Exercise per Week: 0 days   Minutes of Exercise per Session: 0 min  Stress: No Stress Concern Present  Feeling of Stress : Not at all  Social Connections: Moderately Isolated   Frequency of Communication with Friends and Family: Three times a week   Frequency of Social Gatherings with Friends and Family: Once a week   Attends Religious Services: Never   Marine scientist or Organizations: No   Attends Music therapist: Never   Marital Status: Married  Human resources officer Violence: Not At Risk   Fear of Current or Ex-Partner: No   Emotionally Abused: No   Physically Abused: No   Sexually Abused: No    Outpatient Medications Prior to Visit  Medication Sig Dispense Refill   Carboxymethylcellul-Glycerin (LUBRICATING EYE DROPS OP) Place 1 drop into both eyes daily as needed (dry eyes).     carvedilol (COREG) 25 MG tablet TAKE 1 TABLET BY MOUTH TWICE DAILY WITH A MEAL 60 tablet 0   clotrimazole-betamethasone (LOTRISONE) cream Apply 1  application topically daily as needed (rash). Dont use for more than 2 weeks at a time 45 g 1   doxazosin (CARDURA) 4 MG tablet TAKE 1 TABLET EVERY DAY 90 tablet 3   doxycycline (VIBRA-TABS) 100 MG tablet Take 1 tablet (100 mg total) by mouth 2 (two) times daily. 10 tablet 0   furosemide (LASIX) 40 MG tablet TAKE 1 TO 2 TABLETS DAILY AS NEEDED FOR EDEMA. 180 tablet 0   meclizine (ANTIVERT) 25 MG tablet TAKE 1 TABLET THREE TIMES DAILY AS NEEDED  FOR  DIZZINESS  OR  NAUSEA  AS DIRECTED 90 tablet 1   Multiple Vitamin (MULTIVITAMIN WITH MINERALS) TABS tablet Take 1 tablet by mouth daily.     potassium chloride (KLOR-CON) 10 MEQ tablet TAKE 1 TABLET EVERY DAY 90 tablet 1   RESTASIS 0.05 % ophthalmic emulsion      traMADol (ULTRAM) 50 MG tablet Take 1 tablet (50 mg total) by mouth every 8 (eight) hours as needed. 90 tablet 0   valsartan (DIOVAN) 320 MG tablet Take 1 tablet (320 mg total) by mouth daily. 90 tablet 1   vitamin B-12 (CYANOCOBALAMIN) 1000 MCG tablet Take 1,000 mcg by mouth daily.     warfarin (COUMADIN) 5 MG tablet TAKE 5 MG EVERY MONDAY AND THURSDAY. TAKE 2.5 MG ALL OTHER DAYS 59 tablet 1   No facility-administered medications prior to visit.    Allergies  Allergen Reactions   Penicillins     Childhood allergy Has patient had a PCN reaction causing immediate rash, facial/tongue/throat swelling, SOB or lightheadedness with hypotension: Unknown Has patient had a PCN reaction causing severe rash involving mucus membranes or skin necrosis: Unknown Has patient had a PCN reaction that required hospitalization: Unknown Has patient had a PCN reaction occurring within the last 10 years: No If all of the above answers are "NO", then may proceed with Cephalosporin use.     Review of Systems     Objective:    Physical Exam Vitals reviewed.  Constitutional:      Appearance: He is well-developed.  HENT:     Head: Normocephalic and atraumatic.  Eyes:     Conjunctiva/sclera:  Conjunctivae normal.  Cardiovascular:     Rate and Rhythm: Normal rate.  Pulmonary:     Effort: Pulmonary effort is normal.  Skin:    General: Skin is dry.     Coloration: Skin is not pale.     Comments: 2 discrete lesions.  The upper smaller lesion is actually almost completely healed.  The lower lesion looks much smaller and just has a  little bit of fresh blood and serous drainage.  Neurological:     Mental Status: He is alert and oriented to person, place, and time.  Psychiatric:        Behavior: Behavior normal.    BP 132/78    Pulse 65    Resp 18    Ht 6' 2" (1.88 m)    Wt (!) 305 lb (138.3 kg)    SpO2 97%    BMI 39.16 kg/m  Wt Readings from Last 3 Encounters:  12/11/21 (!) 305 lb (138.3 kg)  12/08/21 (!) 311 lb (141.1 kg)  12/05/21 (!) 319 lb 1.9 oz (144.8 kg)    Health Maintenance Due  Topic Date Due   COVID-19 Vaccine (3 - Booster for Pfizer series) 04/30/2020    There are no preventive care reminders to display for this patient.   Lab Results  Component Value Date   TSH 4.16 09/08/2021   Lab Results  Component Value Date   WBC 6.3 06/13/2021   HGB 14.5 06/13/2021   HCT 42.5 06/13/2021   MCV 92.8 06/13/2021   PLT 209 06/13/2021   Lab Results  Component Value Date   NA 142 09/08/2021   K 4.5 09/08/2021   CO2 28 09/08/2021   GLUCOSE 106 (H) 09/08/2021   BUN 19 09/08/2021   CREATININE 1.04 09/08/2021   BILITOT 2.6 (H) 06/13/2021   ALKPHOS 68 04/30/2017   AST 21 06/13/2021   ALT 24 06/13/2021   PROT 6.4 06/13/2021   ALBUMIN 3.5 (L) 04/30/2017   CALCIUM 9.1 09/08/2021   ANIONGAP 6 08/02/2018   EGFR 77 09/08/2021   Lab Results  Component Value Date   CHOL 134 06/13/2021   Lab Results  Component Value Date   HDL 42 06/13/2021   Lab Results  Component Value Date   LDLCALC 76 06/13/2021   Lab Results  Component Value Date   TRIG 81 06/13/2021   Lab Results  Component Value Date   CHOLHDL 3.2 06/13/2021   Lab Results  Component Value  Date   HGBA1C 4.9 10/06/2018       Assessment & Plan:   Problem List Items Addressed This Visit       Cardiovascular and Mediastinum   Venous (peripheral) insufficiency - Primary   Relevant Orders   BASIC METABOLIC PANEL WITH GFR   Other Visit Diagnoses     Venous stasis ulcer of other part of right lower leg limited to breakdown of skin without varicose veins (HCC)       Relevant Orders   BASIC METABOLIC PANEL WITH GFR      Stasis ulcer-now that he is got a significant amount of fluid off I think he will continue to heal well.  We discussed just using Xeroform and Coban dressing and changing it daily after his shower and using mupirocin ointment.  Okay to finish off the doxycycline.  If not improving or changing over the next week then please let us know.  Venous stasis-Since we adjusted his Lasix I would like to recheck a BUN and creatinine.  Meds ordered this encounter  Medications   mupirocin ointment (BACTROBAN) 2 %    Sig: Apply topically 2 (two) times daily as needed.    Dispense:  30 g    Refill:  0     Beatrice Lecher, MD

## 2021-12-11 NOTE — Progress Notes (Signed)
° °Acute Office Visit ° °Subjective:  ° ° Patient ID: Jerome Hicks, male    DOB: 10/01/1951, 70 y.o.   MRN: [id number] ° °Chief Complaint  °Patient presents with  ° Follow-up  °  Swelling/Blisters on Legs improving   ° ° °HPI °Patient is in today for follow-up venous stasis ulcer on posterior right calf.  He says overall he feels like it is getting better.  He did have the [place] boot on through the weekend and took it off today.  He feels like his swelling has gone down significantly and in fact his weight is down about 6 pounds which is fantastic.  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)   ° get yearly PFT's  ° Ascending aortic aneurysm   ° seen on 01-31-18 ct chest ; patient denies awareness  ° Atrial fibrillation (HCC)   ° Cataracts, bilateral   ° ED (erectile dysfunction)   ° has been prescribed Levitra and Viagrain in the past  ° Hypertension   ° Internal hemorrhoids   ° Kidney stones   ° Leg edema   ° recurrent cellulitis  ° Scrotal abscess 3-03  ° Shoulder arthritis   ° Sigmoid diverticulosis   ° ° °Past Surgical History:  °Procedure Laterality Date  °  MRSA infection in left groin requiring surgical debridement  12-08  ° CHOLECYSTECTOMY  1971  ° kidney stones  1990's  ° Ureteroscopic stone extraction  ° TONSILLECTOMY    ° 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  ° Procedure: RIGHT TOTAL KNEE ARTHROPLASTY;  Surgeon: Graves, John, MD;  Location: [facility];  Service: Orthopedics;  Laterality: Right;  ° ° °Family History  °Problem Relation Age of Onset  ° Heart attack Mother 73  ° Heart attack Father 74  ° Heart attack Sister 38  ° Heart attack Brother 43  ° Multiple sclerosis Daughter   ° ° °Social History  ° °Socioeconomic History  ° Marital status: Married  °  Spouse name: Becky  ° Number of children: 2  ° Years of education: 14  ° Highest education level: Associate degree: occupational,  technical, or vocational program  °Occupational History  ° Occupation: On disability   °  Comment: retired  °Tobacco Use  ° Smoking status: Never  ° Smokeless tobacco: Former  °  Types: Chew  °  Quit date: 05/21/2021  °Vaping Use  ° Vaping Use: Never used  °Substance and Sexual Activity  ° Alcohol use: No  ° Drug use: No  ° Sexual activity: Not Currently  °Other Topics Concern  ° Not on file  °Social History Narrative  ° Lives with his wife. He has two children. He enjoys being outside and working with wood.  ° °Social Determinants of Health  ° °Financial Resource Strain: Low Risk   ° Difficulty of Paying Living Expenses: Not hard at all  °Food Insecurity: No Food Insecurity  ° Worried About Running Out of Food in the Last Year: Never true  ° Ran Out of Food in the Last Year: Never true  °Transportation Needs: No Transportation Needs  ° Lack of Transportation (Medical): No  ° Lack of Transportation (Non-Medical): No  °Physical Activity: Inactive  ° Days of Exercise per Week: 0 days  ° Minutes of Exercise per Session: 0 min  °Stress: No Stress Concern Present  °   Feeling of Stress : Not at all  °Social Connections: Moderately Isolated  ° Frequency of Communication with Friends and Family: Three times a week  ° Frequency of Social Gatherings with Friends and Family: Once a week  ° Attends Religious Services: Never  ° Active Member of Clubs or Organizations: No  ° Attends Club or Organization Meetings: Never  ° Marital Status: Married  °Intimate Partner Violence: Not At Risk  ° Fear of Current or Ex-Partner: No  ° Emotionally Abused: No  ° Physically Abused: No  ° Sexually Abused: No  ° ° °Outpatient Medications Prior to Visit  °Medication Sig Dispense Refill  ° Carboxymethylcellul-Glycerin (LUBRICATING EYE DROPS OP) Place 1 drop into both eyes daily as needed (dry eyes).    ° carvedilol (COREG) 25 MG tablet TAKE 1 TABLET BY MOUTH TWICE DAILY WITH A MEAL 60 tablet 0  ° clotrimazole-betamethasone (LOTRISONE) cream Apply 1  application topically daily as needed (rash). Dont use for more than 2 weeks at a time 45 g 1  ° doxazosin (CARDURA) 4 MG tablet TAKE 1 TABLET EVERY DAY 90 tablet 3  ° doxycycline (VIBRA-TABS) 100 MG tablet Take 1 tablet (100 mg total) by mouth 2 (two) times daily. 10 tablet 0  ° furosemide (LASIX) 40 MG tablet TAKE 1 TO 2 TABLETS DAILY AS NEEDED FOR EDEMA. 180 tablet 0  ° meclizine (ANTIVERT) 25 MG tablet TAKE 1 TABLET THREE TIMES DAILY AS NEEDED  FOR  DIZZINESS  OR  NAUSEA  AS DIRECTED 90 tablet 1  ° Multiple Vitamin (MULTIVITAMIN WITH MINERALS) TABS tablet Take 1 tablet by mouth daily.    ° potassium chloride (KLOR-CON) 10 MEQ tablet TAKE 1 TABLET EVERY DAY 90 tablet 1  ° RESTASIS 0.05 % ophthalmic emulsion     ° traMADol (ULTRAM) 50 MG tablet Take 1 tablet (50 mg total) by mouth every 8 (eight) hours as needed. 90 tablet 0  ° valsartan (DIOVAN) 320 MG tablet Take 1 tablet (320 mg total) by mouth daily. 90 tablet 1  ° vitamin B-12 (CYANOCOBALAMIN) 1000 MCG tablet Take 1,000 mcg by mouth daily.    ° warfarin (COUMADIN) 5 MG tablet TAKE 5 MG EVERY MONDAY AND THURSDAY. TAKE 2.5 MG ALL OTHER DAYS 59 tablet 1  ° °No facility-administered medications prior to visit.  ° ° °Allergies  °Allergen Reactions  ° Penicillins   °  Childhood allergy °Has patient had a PCN reaction causing immediate rash, facial/tongue/throat swelling, SOB or lightheadedness with hypotension: Unknown °Has patient had a PCN reaction causing severe rash involving mucus membranes or skin necrosis: Unknown °Has patient had a PCN reaction that required hospitalization: Unknown °Has patient had a PCN reaction occurring within the last 10 years: No °If all of the above answers are "NO", then may proceed with Cephalosporin use. °  ° ° °Review of Systems ° °   °Objective:  °  °Physical Exam °Vitals reviewed.  °Constitutional:   °   Appearance: He is well-developed.  °HENT:  °   Head: Normocephalic and atraumatic.  °Eyes:  °   Conjunctiva/sclera:  Conjunctivae normal.  °Cardiovascular:  °   Rate and Rhythm: Normal rate.  °Pulmonary:  °   Effort: Pulmonary effort is normal.  °Skin: °   General: Skin is dry.  °   Coloration: Skin is not pale.  °   Comments: 2 discrete lesions.  The upper smaller lesion is actually almost completely healed.  The lower lesion looks much smaller and just has a   little bit of fresh blood and serous drainage.  °Neurological:  °   Mental Status: He is alert and oriented to person, place, and time.  °Psychiatric:     °   Behavior: Behavior normal.  ° ° °BP 132/78    Pulse 65    Resp 18    Ht 6' 2" (1.88 m)    Wt (!) 305 lb (138.3 kg)    SpO2 97%    BMI 39.16 kg/m²  °Wt Readings from Last 3 Encounters:  °12/11/21 (!) 305 lb (138.3 kg)  °12/08/21 (!) 311 lb (141.1 kg)  °12/05/21 (!) 319 lb 1.9 oz (144.8 kg)  ° ° °Health Maintenance Due  °Topic Date Due  ° COVID-19 Vaccine (3 - Booster for Pfizer series) 04/30/2020  ° ° °There are no preventive care reminders to display for this patient. ° ° °Lab Results  °Component Value Date  ° TSH 4.16 09/08/2021  ° °Lab Results  °Component Value Date  ° WBC 6.3 06/13/2021  ° HGB 14.5 06/13/2021  ° HCT 42.5 06/13/2021  ° MCV 92.8 06/13/2021  ° PLT 209 06/13/2021  ° °Lab Results  °Component Value Date  ° NA 142 09/08/2021  ° K 4.5 09/08/2021  ° CO2 28 09/08/2021  ° GLUCOSE 106 (H) 09/08/2021  ° BUN 19 09/08/2021  ° CREATININE 1.04 09/08/2021  ° BILITOT 2.6 (H) 06/13/2021  ° ALKPHOS 68 04/30/2017  ° AST 21 06/13/2021  ° ALT 24 06/13/2021  ° PROT 6.4 06/13/2021  ° ALBUMIN 3.5 (L) 04/30/2017  ° CALCIUM 9.1 09/08/2021  ° ANIONGAP 6 08/02/2018  ° EGFR 77 09/08/2021  ° °Lab Results  °Component Value Date  ° CHOL 134 06/13/2021  ° °Lab Results  °Component Value Date  ° HDL 42 06/13/2021  ° °Lab Results  °Component Value Date  ° LDLCALC 76 06/13/2021  ° °Lab Results  °Component Value Date  ° TRIG 81 06/13/2021  ° °Lab Results  °Component Value Date  ° CHOLHDL 3.2 06/13/2021  ° °Lab Results  °Component Value  Date  ° HGBA1C 4.9 10/06/2018  ° ° °   °Assessment & Plan:  ° °Problem List Items Addressed This Visit   ° °  ° Cardiovascular and Mediastinum  ° Venous (peripheral) insufficiency - Primary  ° Relevant Orders  ° BASIC METABOLIC PANEL WITH GFR  ° °Other Visit Diagnoses   ° ° Venous stasis ulcer of other part of right lower leg limited to breakdown of skin without varicose veins (HCC)      ° Relevant Orders  ° BASIC METABOLIC PANEL WITH GFR  ° °  ° °Stasis ulcer-now that he is got a significant amount of fluid off I think he will continue to heal well.  We discussed just using Xeroform and Coban dressing and changing it daily after his shower and using mupirocin ointment.  Okay to finish off the doxycycline.  If not improving or changing over the next week then please let us know. ° °Venous stasis-Since we adjusted his Lasix I would like to recheck a BUN and creatinine. ° °Meds ordered this encounter  °Medications  ° mupirocin ointment (BACTROBAN) 2 %  °  Sig: Apply topically 2 (two) times daily as needed.  °  Dispense:  30 g  °  Refill:  0  ° ° ° °Jimmye Wisnieski, MD ° °

## 2021-12-11 NOTE — Progress Notes (Signed)
Measurements for patient's blister on back of right leg 1 inch in width and 1.25 inches in height

## 2021-12-11 NOTE — Patient Instructions (Signed)
Continue to alternate the Lasix taking 2 tabs on 1 day, then 1 tab the next and then continuing to alternate that pattern.

## 2021-12-12 LAB — BASIC METABOLIC PANEL WITH GFR
BUN: 24 mg/dL (ref 7–25)
CO2: 28 mmol/L (ref 20–32)
Calcium: 9.3 mg/dL (ref 8.6–10.3)
Chloride: 105 mmol/L (ref 98–110)
Creat: 1.08 mg/dL (ref 0.70–1.28)
Glucose, Bld: 100 mg/dL — ABNORMAL HIGH (ref 65–99)
Potassium: 4.5 mmol/L (ref 3.5–5.3)
Sodium: 142 mmol/L (ref 135–146)
eGFR: 73 mL/min/{1.73_m2} (ref >=60)

## 2021-12-12 NOTE — Progress Notes (Signed)
Your lab work is within acceptable range and there are no concerning findings.  Keep taking the Lasix 2 on 1 day and 1 in the next and then continuing to alternate.  Do you to work on eating low-salt diet and eating foods that are low in sodium.  Shooting for less than 2000 mg total per day.

## 2021-12-25 ENCOUNTER — Ambulatory Visit (INDEPENDENT_AMBULATORY_CARE_PROVIDER_SITE_OTHER): Payer: Medicare HMO | Admitting: Family Medicine

## 2021-12-25 ENCOUNTER — Ambulatory Visit (INDEPENDENT_AMBULATORY_CARE_PROVIDER_SITE_OTHER): Payer: Medicare HMO

## 2021-12-25 ENCOUNTER — Encounter: Payer: Self-pay | Admitting: Family Medicine

## 2021-12-25 ENCOUNTER — Ambulatory Visit: Payer: Self-pay | Admitting: Family Medicine

## 2021-12-25 ENCOUNTER — Other Ambulatory Visit: Payer: Self-pay

## 2021-12-25 VITALS — BP 132/70 | HR 72 | Resp 18 | Ht 74.0 in | Wt 319.0 lb

## 2021-12-25 DIAGNOSIS — I10 Essential (primary) hypertension: Secondary | ICD-10-CM | POA: Diagnosis not present

## 2021-12-25 DIAGNOSIS — I4891 Unspecified atrial fibrillation: Secondary | ICD-10-CM

## 2021-12-25 DIAGNOSIS — J61 Pneumoconiosis due to asbestos and other mineral fibers: Secondary | ICD-10-CM

## 2021-12-25 DIAGNOSIS — R0602 Shortness of breath: Secondary | ICD-10-CM

## 2021-12-25 DIAGNOSIS — I872 Venous insufficiency (chronic) (peripheral): Secondary | ICD-10-CM | POA: Diagnosis not present

## 2021-12-25 DIAGNOSIS — L97811 Non-pressure chronic ulcer of other part of right lower leg limited to breakdown of skin: Secondary | ICD-10-CM | POA: Diagnosis not present

## 2021-12-25 LAB — POCT INR: INR: 2.9 (ref 2.0–3.0)

## 2021-12-25 NOTE — Patient Instructions (Signed)
Please make sure you have lost about 8 lbs by the end of the week Go back up to 2 tab daily.

## 2021-12-25 NOTE — Assessment & Plan Note (Signed)
See anticoagulation flowsheet for adjustments to Coumadin today.  Lab Results  Component Value Date   INR 2.9 12/25/2021   INR 2.5 12/05/2021   INR 2.2 11/01/2021

## 2021-12-25 NOTE — Assessment & Plan Note (Signed)
Have restrictive lung disease and asbestosis.  I suspect the shortness of breath is most likely from volume overload.  But I am can go ahead and get an updated chest x-ray today

## 2021-12-25 NOTE — Progress Notes (Signed)
Established Patient Office Visit  Subjective:  Patient ID: Jerome Hicks, male    DOB: June 10, 1951  Age: 71 y.o. MRN: [id number]  CC:  Chief Complaint  Patient presents with   Shortness of Breath   Hypertension    Follow up     HPI Jerome Hicks presents for   Hypertension- Pt denies chest pain, SOB, dizziness, or heart palpitations.  Taking meds as directed w/o problems.  Denies medication side effects.    F/U SOB -he says that he has been fighting a head cold for a little over a week he says the nasal congestion is actually a lot better he never really had a sore throat with it never had a lot of ear pressure or fullness.  No fever chills or sweats.  But now he starting to feel little bit more short of breath he denies a cough or wheeze.  Follow-up the venous stasis ulcer on the posterior right lower leg.  He says it is pretty much about healed he continues to apply moisturizer daily.  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)    get yearly PFT's   Ascending aortic aneurysm    seen on 01-31-18 ct chest ; patient denies awareness   Atrial fibrillation ([place])    Cataracts, bilateral    ED (erectile dysfunction)    has been prescribed Levitra and Viagrain in the past   Hypertension    Internal hemorrhoids    Kidney stones    Leg edema    recurrent cellulitis   Scrotal abscess 3-03   Shoulder arthritis    Sigmoid diverticulosis     Past Surgical History:  Procedure Laterality Date    MRSA infection in left groin requiring surgical debridement  12-08   CHOLECYSTECTOMY  1971   kidney stones  1990's   Ureteroscopic stone extraction   TONSILLECTOMY     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   Procedure: RIGHT TOTAL KNEE ARTHROPLASTY;  Surgeon: Dorna Leitz, MD;  Location: [facility];  Service: Orthopedics;  Laterality: Right;    Family History  Problem Relation Age of Onset   Heart attack Mother 38   Heart attack Father 18   Heart  attack Sister 5   Heart attack Brother 19   Multiple sclerosis Daughter     Social History   Socioeconomic History   Marital status: Married    Spouse name: Becky   Number of children: 2   Years of education: 14   Highest education level: Associate degree: occupational, Hotel manager, or vocational program  Occupational History   Occupation: On disability     Comment: retired  Tobacco Use   Smoking status: Never   Smokeless tobacco: Former    Types: Chew    Quit date: 05/21/2021  Vaping Use   Vaping Use: Never used  Substance and Sexual Activity   Alcohol use: No   Drug use: No   Sexual activity: Not Currently  Other Topics Concern   Not on file  Social History Narrative   Lives with his wife. He has two children. He enjoys being outside and working with wood.   Social Determinants of Health   Financial Resource Strain: Low Risk    Difficulty of Paying Living Expenses: Not hard at all  Food Insecurity: No Food Insecurity   Worried About Charity fundraiser in the Last Year: Never true   [place] in the Last  Year: Never true  Transportation Needs: No Transportation Needs   Lack of Transportation (Medical): No   Lack of Transportation (Non-Medical): No  Physical Activity: Inactive   Days of Exercise per Week: 0 days   Minutes of Exercise per Session: 0 min  Stress: No Stress Concern Present   Feeling of Stress : Not at all  Social Connections: Moderately Isolated   Frequency of Communication with Friends and Family: Three times a week   Frequency of Social Gatherings with Friends and Family: Once a week   Attends Religious Services: Never   Marine scientist or Organizations: No   Attends Music therapist: Never   Marital Status: Married  Human resources officer Violence: Not At Risk   Fear of Current or Ex-Partner: No   Emotionally Abused: No   Physically Abused: No   Sexually Abused: No    Outpatient Medications Prior to Visit  Medication  Sig Dispense Refill   Carboxymethylcellul-Glycerin (LUBRICATING EYE DROPS OP) Place 1 drop into both eyes daily as needed (dry eyes).     carvedilol (COREG) 25 MG tablet TAKE 1 TABLET BY MOUTH TWICE DAILY WITH A MEAL 60 tablet 0   clotrimazole-betamethasone (LOTRISONE) cream Apply 1 application topically daily as needed (rash). Dont use for more than 2 weeks at a time 45 g 1   doxazosin (CARDURA) 4 MG tablet TAKE 1 TABLET EVERY DAY 90 tablet 3   doxycycline (VIBRA-TABS) 100 MG tablet Take 1 tablet (100 mg total) by mouth 2 (two) times daily. 10 tablet 0   furosemide (LASIX) 40 MG tablet TAKE 1 TO 2 TABLETS DAILY AS NEEDED FOR EDEMA. 180 tablet 0   meclizine (ANTIVERT) 25 MG tablet TAKE 1 TABLET THREE TIMES DAILY AS NEEDED  FOR  DIZZINESS  OR  NAUSEA  AS DIRECTED 90 tablet 1   Multiple Vitamin (MULTIVITAMIN WITH MINERALS) TABS tablet Take 1 tablet by mouth daily.     mupirocin ointment (BACTROBAN) 2 % Apply topically 2 (two) times daily as needed. 30 g 0   potassium chloride (KLOR-CON) 10 MEQ tablet TAKE 1 TABLET EVERY DAY 90 tablet 1   RESTASIS 0.05 % ophthalmic emulsion      traMADol (ULTRAM) 50 MG tablet Take 1 tablet (50 mg total) by mouth every 8 (eight) hours as needed. 90 tablet 0   valsartan (DIOVAN) 320 MG tablet Take 1 tablet (320 mg total) by mouth daily. 90 tablet 1   vitamin B-12 (CYANOCOBALAMIN) 1000 MCG tablet Take 1,000 mcg by mouth daily.     warfarin (COUMADIN) 5 MG tablet TAKE 5 MG EVERY MONDAY AND THURSDAY. TAKE 2.5 MG ALL OTHER DAYS 59 tablet 1   No facility-administered medications prior to visit.    Allergies  Allergen Reactions   Penicillins     Childhood allergy Has patient had a PCN reaction causing immediate rash, facial/tongue/throat swelling, SOB or lightheadedness with hypotension: Unknown Has patient had a PCN reaction causing severe rash involving mucus membranes or skin necrosis: Unknown Has patient had a PCN reaction that required hospitalization:  Unknown Has patient had a PCN reaction occurring within the last 10 years: No If all of the above answers are "NO", then may proceed with Cephalosporin use.     ROS Review of Systems    Objective:    Physical Exam Constitutional:      Appearance: He is well-developed.  HENT:     Head: Normocephalic and atraumatic.     Right Ear: Tympanic membrane, ear canal  and external ear normal.     Left Ear: Tympanic membrane, ear canal and external ear normal.     Nose: Nose normal.     Mouth/Throat:     Mouth: Mucous membranes are moist.     Pharynx: Oropharynx is clear. No posterior oropharyngeal erythema.  Eyes:     Conjunctiva/sclera: Conjunctivae normal.     Pupils: Pupils are equal, round, and reactive to light.  Neck:     Thyroid: No thyromegaly.  Cardiovascular:     Rate and Rhythm: Normal rate.     Heart sounds: Normal heart sounds.  Pulmonary:     Effort: Pulmonary effort is normal.     Breath sounds: Normal breath sounds.  Musculoskeletal:     Cervical back: Neck supple.  Lymphadenopathy:     Cervical: No cervical adenopathy.  Skin:    General: Skin is warm and dry.     Comments: + pitting edema.  Area on his posterior right calf seems to be healing well.  There is some thick scale but no open wounds.  Just encouraged him to continue to moisturize well.  Neurological:     Mental Status: He is alert and oriented to person, place, and time.  Psychiatric:        Mood and Affect: Mood normal.    BP 132/70 (BP Location: Left Arm)    Pulse 72    Resp 18    Ht 6' 2"  (1.88 m)    Wt (!) 319 lb (144.7 kg)    SpO2 95%    BMI 40.96 kg/m  Wt Readings from Last 3 Encounters:  12/25/21 (!) 319 lb (144.7 kg)  12/11/21 (!) 305 lb (138.3 kg)  12/08/21 (!) 311 lb (141.1 kg)     There are no preventive care reminders to display for this patient.  There are no preventive care reminders to display for this patient.  Lab Results  Component Value Date   TSH 4.16 09/08/2021    Lab Results  Component Value Date   WBC 6.3 06/13/2021   HGB 14.5 06/13/2021   HCT 42.5 06/13/2021   MCV 92.8 06/13/2021   PLT 209 06/13/2021   Lab Results  Component Value Date   NA 142 12/11/2021   K 4.5 12/11/2021   CO2 28 12/11/2021   GLUCOSE 100 (H) 12/11/2021   BUN 24 12/11/2021   CREATININE 1.08 12/11/2021   BILITOT 2.6 (H) 06/13/2021   ALKPHOS 68 04/30/2017   AST 21 06/13/2021   ALT 24 06/13/2021   PROT 6.4 06/13/2021   ALBUMIN 3.5 (L) 04/30/2017   CALCIUM 9.3 12/11/2021   ANIONGAP 6 08/02/2018   EGFR 73 12/11/2021   Lab Results  Component Value Date   CHOL 134 06/13/2021   Lab Results  Component Value Date   HDL 42 06/13/2021   Lab Results  Component Value Date   LDLCALC 76 06/13/2021   Lab Results  Component Value Date   TRIG 81 06/13/2021   Lab Results  Component Value Date   CHOLHDL 3.2 06/13/2021   Lab Results  Component Value Date   HGBA1C 4.9 10/06/2018      Assessment & Plan:   Problem List Items Addressed This Visit       Cardiovascular and Mediastinum   Venous (peripheral) insufficiency    Unfortunately I feel like he is significantly volume overloaded again.  Probably about 9 to 10 pounds.  We discussed increasing his diuretic back to 2 tabs daily.  He had  been alternating between 2 and 1 tab every other day as I had recommended after we had pulled a lot of volume off.  Unfortunately I think he is slowly gaining it back.  He does not have any fluid blisters on the legs but he is feeling more short of breath and he is more swollen.      ESSENTIAL HYPERTENSION, BENIGN    BP at goal.       Atrial fibrillation ([place])    See anticoagulation flowsheet for adjustments to Coumadin today.  Lab Results  Component Value Date   INR 2.9 12/25/2021   INR 2.5 12/05/2021   INR 2.2 11/01/2021         Relevant Orders   POCT INR (Completed)     Respiratory   Asbestosis ([place])    Have restrictive lung disease and asbestosis.  I  suspect the shortness of breath is most likely from volume overload.  But I am can go ahead and get an updated chest x-ray today      Other Visit Diagnoses     Venous stasis ulcer of other part of right lower leg limited to breakdown of skin without varicose veins (HCC)    -  Primary   SOB (shortness of breath)       Relevant Orders   DG Chest 2 View       No orders of the defined types were placed in this encounter.   Follow-up: No follow-ups on file.    Beatrice Lecher, MD

## 2021-12-25 NOTE — Assessment & Plan Note (Signed)
BP at goal 

## 2021-12-25 NOTE — Progress Notes (Signed)
Call patient: He has elevated hemidiaphragm on the right but this is not new.  He does have what looks like maybe a little bit of fluid on that right so again I really want him to increase the diuretic and try to get some fluid off and see if he is feeling better.

## 2021-12-25 NOTE — Assessment & Plan Note (Signed)
Unfortunately I feel like he is significantly volume overloaded again.  Probably about 9 to 10 pounds.  We discussed increasing his diuretic back to 2 tabs daily.  He had been alternating between 2 and 1 tab every other day as I had recommended after we had pulled a lot of volume off.  Unfortunately I think he is slowly gaining it back.  He does not have any fluid blisters on the legs but he is feeling more short of breath and he is more swollen.

## 2021-12-31 ENCOUNTER — Other Ambulatory Visit: Payer: Self-pay | Admitting: Family Medicine

## 2021-12-31 DIAGNOSIS — I10 Essential (primary) hypertension: Secondary | ICD-10-CM

## 2022-01-02 ENCOUNTER — Ambulatory Visit: Payer: Medicare HMO

## 2022-01-26 ENCOUNTER — Ambulatory Visit (INDEPENDENT_AMBULATORY_CARE_PROVIDER_SITE_OTHER): Payer: Medicare HMO | Admitting: Family Medicine

## 2022-01-26 ENCOUNTER — Other Ambulatory Visit: Payer: Self-pay

## 2022-01-26 VITALS — BP 129/78 | HR 71

## 2022-01-26 DIAGNOSIS — I4891 Unspecified atrial fibrillation: Secondary | ICD-10-CM

## 2022-01-26 LAB — POCT INR: INR: 2.4 (ref 2.0–3.0)

## 2022-01-26 NOTE — Progress Notes (Signed)
Patient advised.

## 2022-01-26 NOTE — Progress Notes (Signed)
No change. F/U 4 weeks.

## 2022-02-23 ENCOUNTER — Ambulatory Visit (INDEPENDENT_AMBULATORY_CARE_PROVIDER_SITE_OTHER): Payer: Medicare HMO | Admitting: Family Medicine

## 2022-02-23 ENCOUNTER — Other Ambulatory Visit: Payer: Self-pay

## 2022-02-23 VITALS — BP 149/84 | HR 57

## 2022-02-23 DIAGNOSIS — I4891 Unspecified atrial fibrillation: Secondary | ICD-10-CM

## 2022-02-23 LAB — POCT INR: INR: 2.4 (ref 2.0–3.0)

## 2022-02-23 NOTE — Progress Notes (Signed)
Pt reports that he has had some recent weight gain and fluid retention.  Pt reports that he is taking furosemide one tablet every other day alternating with 2 tablets.  Per Dr. Madilyn Fireman, advised pt to take an extra furosemide this weekend.  Pt expressed understanding and is agreeable.  Charyl Bigger, CMA ?

## 2022-02-23 NOTE — Progress Notes (Signed)
Agree with documentation as above.   Leaann Nevils, MD  

## 2022-03-01 ENCOUNTER — Other Ambulatory Visit: Payer: Self-pay | Admitting: Family Medicine

## 2022-03-01 DIAGNOSIS — I10 Essential (primary) hypertension: Secondary | ICD-10-CM

## 2022-03-06 ENCOUNTER — Other Ambulatory Visit: Payer: Self-pay | Admitting: Family Medicine

## 2022-03-14 ENCOUNTER — Telehealth: Payer: Self-pay | Admitting: Family Medicine

## 2022-03-14 NOTE — Telephone Encounter (Signed)
Pt called and stated that [facility] was requesting a script for his lisinopril. But according to his chart he is no longer taking it. Patient was not sure what med was supposed to replace the lisinopril. Please contact him and advise.  ?

## 2022-03-15 NOTE — Telephone Encounter (Signed)
Patient should be taking Valsartan and not Lisinopril. Spoke with patient. Patient stated he is taking Valsartan and not Lisinopril and just wanted to make sure that is correct. Patient is taking medication as directed.  ?

## 2022-03-23 ENCOUNTER — Ambulatory Visit (INDEPENDENT_AMBULATORY_CARE_PROVIDER_SITE_OTHER): Payer: Medicare HMO | Admitting: Family Medicine

## 2022-03-23 VITALS — BP 142/85 | HR 62 | Ht 74.0 in | Wt 317.0 lb

## 2022-03-23 DIAGNOSIS — I4891 Unspecified atrial fibrillation: Secondary | ICD-10-CM | POA: Diagnosis not present

## 2022-03-23 LAB — POCT INR: INR: 2.2 (ref 2.0–3.0)

## 2022-03-23 NOTE — Progress Notes (Signed)
Pt here for INR check no missed doses, diet changes,bruising,bleeding,CP,SOB. ? ?Pt's BP was elevated had him to sit for 10 minutes and rechecked and it did come down some. He stated that he took it before leaving home today and it was 122/78.  ? ? ?

## 2022-03-23 NOTE — Progress Notes (Signed)
Continue current regimen.  Repeat INR in 4 weeks.

## 2022-03-23 NOTE — Patient Instructions (Signed)
Pt to RTC in 1 month for next INR check.  ?

## 2022-04-20 ENCOUNTER — Ambulatory Visit (INDEPENDENT_AMBULATORY_CARE_PROVIDER_SITE_OTHER): Payer: Medicare HMO | Admitting: Family Medicine

## 2022-04-20 VITALS — BP 144/74 | HR 57 | Ht 74.0 in | Wt 317.0 lb

## 2022-04-20 DIAGNOSIS — I4891 Unspecified atrial fibrillation: Secondary | ICD-10-CM

## 2022-04-20 LAB — POCT INR: INR: 2.4 (ref 2.0–3.0)

## 2022-04-20 NOTE — Progress Notes (Signed)
Repeat INR in 1 month.

## 2022-04-20 NOTE — Progress Notes (Signed)
Pt here for INR check no missed doses, diet changes,bruising,bleeding,CP,SOB.  Pt's BP was elevated had him to sit before rechecking. He has continued  to alternate the Lasix every other day.

## 2022-04-20 NOTE — Progress Notes (Signed)
Agree with documentation as above.  Blood pressure is a little elevated but looks better in January.  We will keep an eye on this.  Consider addition of amlodipine 2.5 mg if not better at next visit.  Beatrice Lecher, MD

## 2022-05-03 ENCOUNTER — Encounter: Payer: Self-pay | Admitting: Family Medicine

## 2022-05-03 ENCOUNTER — Ambulatory Visit (INDEPENDENT_AMBULATORY_CARE_PROVIDER_SITE_OTHER): Payer: Medicare HMO

## 2022-05-03 ENCOUNTER — Ambulatory Visit (INDEPENDENT_AMBULATORY_CARE_PROVIDER_SITE_OTHER): Payer: Medicare HMO | Admitting: Family Medicine

## 2022-05-03 VITALS — BP 157/77 | HR 78 | Resp 18 | Ht 74.0 in | Wt 318.0 lb

## 2022-05-03 DIAGNOSIS — R0602 Shortness of breath: Secondary | ICD-10-CM | POA: Diagnosis not present

## 2022-05-03 DIAGNOSIS — I10 Essential (primary) hypertension: Secondary | ICD-10-CM

## 2022-05-03 DIAGNOSIS — M79661 Pain in right lower leg: Secondary | ICD-10-CM

## 2022-05-03 DIAGNOSIS — M7989 Other specified soft tissue disorders: Secondary | ICD-10-CM | POA: Diagnosis not present

## 2022-05-03 DIAGNOSIS — M25561 Pain in right knee: Secondary | ICD-10-CM

## 2022-05-03 DIAGNOSIS — I4891 Unspecified atrial fibrillation: Secondary | ICD-10-CM

## 2022-05-03 MED ORDER — SULFAMETHOXAZOLE-TRIMETHOPRIM 800-160 MG PO TABS: 1.0000 | ORAL_TABLET | Freq: Two times a day (BID) | ORAL | 0 refills | Status: DC

## 2022-05-03 MED ORDER — AMLODIPINE BESYLATE 2.5 MG PO TABS: 2.5000 mg | ORAL_TABLET | Freq: Every day | ORAL | 0 refills | Status: DC

## 2022-05-03 MED ORDER — FUROSEMIDE 10 MG/ML IJ SOLN
40.0000 mg | Freq: Once | INTRAMUSCULAR | Status: AC
  Administered 2022-05-03: 40 mg via INTRAMUSCULAR

## 2022-05-03 NOTE — Assessment & Plan Note (Signed)
He is on Coumadin so DVT is less likely but typically when he is volume overloaded he gets bilateral swelling instead of unilateral swelling some to evaluate for DVT.  We did not do an INR to see if he was subtherapeutic but we can do so if needed.

## 2022-05-03 NOTE — Progress Notes (Signed)
Acute Office Visit  Subjective:     Patient ID: Jerome Hicks, male    DOB: Apr 21, 1951, 71 y.o.   MRN: [id number]  Chief Complaint  Patient presents with   Leg Swelling    Right leg swelling, red painful and draining for 2 weeks     HPI Patient is in today for Right leg swelling, red painful and draining for 2 weeks.  He also reports that his blood pressures have been elevated for a few weeks and for the last 2 weeks he is also been more short of breath.  He says it just feels like he cannot quite get a deep breath and right around his diaphragm area.  No cough or respiratory symptoms.  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  He also has a history of chronic venous stasis that often times when he gets volume overloaded requires treatment including Unna boots.  He has not had any fevers or chills.  He did travel to [place] via car last week.  No prior history of DVT or PE.  He is also been having a lot of pain in his right knee where he is had a knee replacement.  ROS      Objective:    BP (!) 157/77 (BP Location: Left Arm)   Pulse 78   Resp 18   Ht '6\' 2"'$  (1.88 m)   Wt (!) 318 lb (144.2 kg)   SpO2 96%   BMI 40.83 kg/m    Physical Exam Constitutional:      Appearance: He is well-developed.  HENT:     Head: Normocephalic and atraumatic.  Cardiovascular:     Rate and Rhythm: Normal rate and regular rhythm.     Heart sounds: Normal heart sounds.  Pulmonary:     Effort: Pulmonary effort is normal.     Breath sounds: Normal breath sounds.  Musculoskeletal:     Comments: Face edema of his left lower leg and ankle.  He has significant edema of his right lower leg calf and ankle.  His right calf muscle is almost double the size of his left calf muscle he has significant erythema and dusky appearance of the skin as well as some drainage from some fluid blisters.  Also has significant erythema over the anterior shin on the right lower  leg.  Skin:    General: Skin is warm and dry.  Neurological:     Mental Status: He is alert and oriented to person, place, and time.  Psychiatric:        Behavior: Behavior normal.   He is also   No results found for any visits on 05/03/22.      Assessment & Plan:   Problem List Items Addressed This Visit       Cardiovascular and Mediastinum   ESSENTIAL HYPERTENSION, BENIGN   Relevant Medications   amLODipine (NORVASC) 2.5 MG tablet   Atrial fibrillation (HCC)    He is on Coumadin so DVT is less likely but typically when he is volume overloaded he gets bilateral swelling instead of unilateral swelling some to evaluate for DVT.  We did not do an INR to see if he was subtherapeutic but we can do so if needed.       Relevant Medications   amLODipine (NORVASC) 2.5 MG tablet   Other Visit Diagnoses     SOB (shortness of breath)    -  Primary   Relevant Orders  DG Chest 2 View (Completed)   Right leg swelling       Relevant Orders   D-Dimer, Quantitative   US Venous Img Lower Unilateral Right (Completed)   Right calf pain       Relevant Orders   D-Dimer, Quantitative   US Venous Img Lower Unilateral Right (Completed)   Acute pain of right knee       Relevant Orders   DG Knee 1-2 Views Right (Completed)       Right knee pain-we did go ahead and get a plain film since we are sending him down to imaging.  But we will need to schedule with Dr. Dianah Field our sports med doc for further work-up  Calf pain-check for DVT even though he is on Coumadin.  Right leg swelling-we did apply an Unna boot before leaving today.  He also has significant erythema some get a cover for cellulitis as well with Bactrim DS.  Shortness of breath-I suspect that he may be volume overloaded even though his weight is not up.  We will get chest x-ray for further work-up as well.  There is some on 40 mg given IM.    Meds ordered this encounter  Medications   amLODipine (NORVASC) 2.5 MG  tablet    Sig: Take 1 tablet (2.5 mg total) by mouth daily.    Dispense:  90 tablet    Refill:  0   sulfamethoxazole-trimethoprim (BACTRIM DS) 800-160 MG tablet    Sig: Take 1 tablet by mouth 2 (two) times daily.    Dispense:  14 tablet    Refill:  0    Return in about 4 days (around 05/07/2022) for removal of Unna boot with Nurse.  Beatrice Lecher, MD

## 2022-05-03 NOTE — Progress Notes (Signed)
Call patient: The right knee prosthesis looks good they do not see anything shifted out of place.  I would like for him to see Dr. Darene Lamer when he can since he still having pain there.

## 2022-05-03 NOTE — Progress Notes (Signed)
Chest x-ray looks okay in regards to no sign of pneumonia.  He still has that elevated right hemidiaphragm.

## 2022-05-03 NOTE — Addendum Note (Signed)
Addended by: Izora Gala on: 05/03/2022 01:27 PM   Modules accepted: Orders

## 2022-05-03 NOTE — Progress Notes (Signed)
Great news!  No sign of DVT which is awesome.  We will do the compression and also sent her for an antibiotic to his pharmacy to pick up.  I want him to increase his Lasix to 2 tabs daily through the weekend and then we will see how he is doing on Monday when he comes back and to change the Unna boot and see if his blood pressure is better.

## 2022-05-04 ENCOUNTER — Telehealth: Payer: Self-pay | Admitting: Physician Assistant

## 2022-05-04 DIAGNOSIS — R079 Chest pain, unspecified: Secondary | ICD-10-CM

## 2022-05-04 LAB — D-DIMER, QUANTITATIVE: D-Dimer, Quant: 0.6 mcg/mL FEU — ABNORMAL HIGH (ref <0.50)

## 2022-05-04 LAB — EXTRA SPECIMEN

## 2022-05-04 NOTE — Progress Notes (Signed)
Already had [place]. No DVT

## 2022-05-04 NOTE — Telephone Encounter (Signed)
Patient advised.

## 2022-05-04 NOTE — Telephone Encounter (Signed)
Task completed. Auth obtained from the insurance. Renee from [facility] will contact patient to schedule an appointment today.

## 2022-05-04 NOTE — Telephone Encounter (Signed)
Elevated D-dimer. Venous u/s negative. Will get CTA.

## 2022-05-07 ENCOUNTER — Ambulatory Visit (INDEPENDENT_AMBULATORY_CARE_PROVIDER_SITE_OTHER): Payer: Medicare HMO | Admitting: Family Medicine

## 2022-05-07 VITALS — BP 91/59 | HR 97

## 2022-05-07 DIAGNOSIS — L97811 Non-pressure chronic ulcer of other part of right lower leg limited to breakdown of skin: Secondary | ICD-10-CM

## 2022-05-07 DIAGNOSIS — I872 Venous insufficiency (chronic) (peripheral): Secondary | ICD-10-CM | POA: Diagnosis not present

## 2022-05-07 DIAGNOSIS — M7989 Other specified soft tissue disorders: Secondary | ICD-10-CM | POA: Diagnosis not present

## 2022-05-07 NOTE — Progress Notes (Signed)
Pt here for change of Unna boot of right lower leg.  Area looks good.  There is still a slight area of drainage on the posterior aspect of the leg.  Wt is down 3.2 lbs today.  Charyl Bigger, CMA

## 2022-05-07 NOTE — Progress Notes (Signed)
Leg looks better today.  Weight is down.  Blood pressure also looks much better.  I think getting the extra fluid off has been helpful.  We had also added 2.5 mg of amlodipine.  He has been monitoring blood pressures at home and they have been good so we will just continue to monitor for now.  His shortness of breath is much better as well.

## 2022-05-17 ENCOUNTER — Ambulatory Visit: Payer: Self-pay | Admitting: Family Medicine

## 2022-05-17 ENCOUNTER — Ambulatory Visit (INDEPENDENT_AMBULATORY_CARE_PROVIDER_SITE_OTHER): Payer: Medicare HMO | Admitting: Family Medicine

## 2022-05-17 VITALS — BP 132/72 | HR 59

## 2022-05-17 DIAGNOSIS — I10 Essential (primary) hypertension: Secondary | ICD-10-CM | POA: Diagnosis not present

## 2022-05-17 DIAGNOSIS — I4891 Unspecified atrial fibrillation: Secondary | ICD-10-CM

## 2022-05-17 LAB — POCT INR
INR: 2.5 (ref 2.0–3.0)
INR: 2.5 (ref 2–3)

## 2022-05-17 MED ORDER — CARVEDILOL 25 MG PO TABS: 25.0000 mg | ORAL_TABLET | Freq: Two times a day (BID) | ORAL | 3 refills | Status: DC

## 2022-05-17 NOTE — Progress Notes (Signed)
  Jerome Hicks denies any missed doses of medication, extra doses, bleeding gums, nose bleeds, antibiotic use, hospitalization, blood in urine, blood in stool, dental procedures, any bruising, abnormal bleeding, or medication changes.  INR was checked today with a reading of 2.5. Therapeutic dose. To return in 4 weeks for recheck INR.

## 2022-05-17 NOTE — Progress Notes (Signed)
Jerome Hicks's blood pressure looks much better today.  Adjustment to his medication regimen seems to be improving as well as pulling some of the fluid off.  His shortness of breath is improved as well.  INR at goal.  Repeat in 4 weeks.  See anticoagulation flowsheet.

## 2022-05-17 NOTE — Progress Notes (Signed)
See flosheet

## 2022-05-26 ENCOUNTER — Other Ambulatory Visit: Payer: Self-pay | Admitting: Family Medicine

## 2022-05-26 DIAGNOSIS — I482 Chronic atrial fibrillation, unspecified: Secondary | ICD-10-CM

## 2022-05-29 ENCOUNTER — Ambulatory Visit (INDEPENDENT_AMBULATORY_CARE_PROVIDER_SITE_OTHER): Payer: Medicare HMO | Admitting: Sports Medicine

## 2022-05-29 DIAGNOSIS — Z96651 Presence of right artificial knee joint: Secondary | ICD-10-CM

## 2022-05-29 NOTE — Assessment & Plan Note (Addendum)
 71 year old male, approximately 4 years post right knee arthroplasty with Dr. Yvone, having some numbness persistent around the incision which I told him would likely be normal and permanent, he is complaining of increasing pain over the patella. We talked about this back in 2021, we did SynovaSure testing and cultures that were negative. CBC, CMP, ESR all normal. He never touched base again with Dr. Yvone. I did advise him that follow-up for his knee would need to be with his surgeon. Recent x-rays were normal. In the meantime however we will go to three-phase bone scan to evaluate for loosening. He was complaining of some lower extremity edema, he needs to follow this up with his PCP, tells me he is on Lasix  twice daily, he may need to be switched to Demadex (renal function tolerating).

## 2022-05-29 NOTE — Progress Notes (Signed)
    Procedures performed today:    None.  Independent interpretation of notes and tests performed by another provider:   None.  Brief History, Exam, Impression, and Recommendations:    History of arthroplasty of right knee 71 year old male, approximately 4 years post right knee arthroplasty with Dr. Luiz Blare, having some numbness persistent around the incision which I told him would likely be normal and permanent, he is complaining of increasing pain over the patella. We talked about this back in 2021, we did SynovaSure testing and cultures that were negative. CBC, CMP, ESR all normal. He never touched base again with Dr. Luiz Blare. I did advise him that follow-up for his knee would need to be with his surgeon. Recent x-rays were normal. In the meantime however we will go to three-phase bone scan to evaluate for loosening. He was complaining of some lower extremity edema, he needs to follow this up with his PCP, tells me he is on Lasix twice daily, he may need to be switched to Demadex (renal function tolerating).    ____________________________________________ Ihor Austin. Benjamin Stain, M.D., ABFM., CAQSM., AME. Primary Care and Sports Medicine [facility]  Adjunct Professor of Family Medicine  [employer] [employer]  Restaurant manager, fast food

## 2022-05-31 ENCOUNTER — Other Ambulatory Visit: Payer: Self-pay | Admitting: Family Medicine

## 2022-05-31 DIAGNOSIS — I10 Essential (primary) hypertension: Secondary | ICD-10-CM

## 2022-06-06 ENCOUNTER — Other Ambulatory Visit: Payer: Self-pay | Admitting: Family Medicine

## 2022-06-06 ENCOUNTER — Ambulatory Visit (INDEPENDENT_AMBULATORY_CARE_PROVIDER_SITE_OTHER): Payer: Medicare HMO | Admitting: Family Medicine

## 2022-06-06 ENCOUNTER — Encounter: Payer: Self-pay | Admitting: Family Medicine

## 2022-06-06 VITALS — BP 139/76 | HR 64 | Resp 18 | Ht 74.0 in | Wt 323.0 lb

## 2022-06-06 DIAGNOSIS — I4891 Unspecified atrial fibrillation: Secondary | ICD-10-CM

## 2022-06-06 DIAGNOSIS — Z0000 Encounter for general adult medical examination without abnormal findings: Secondary | ICD-10-CM | POA: Diagnosis not present

## 2022-06-06 DIAGNOSIS — J61 Pneumoconiosis due to asbestos and other mineral fibers: Secondary | ICD-10-CM

## 2022-06-06 DIAGNOSIS — R0602 Shortness of breath: Secondary | ICD-10-CM | POA: Diagnosis not present

## 2022-06-06 DIAGNOSIS — R7309 Other abnormal glucose: Secondary | ICD-10-CM | POA: Diagnosis not present

## 2022-06-06 DIAGNOSIS — I10 Essential (primary) hypertension: Secondary | ICD-10-CM | POA: Diagnosis not present

## 2022-06-06 DIAGNOSIS — Z125 Encounter for screening for malignant neoplasm of prostate: Secondary | ICD-10-CM | POA: Diagnosis not present

## 2022-06-06 LAB — POCT INR: INR: 2.3 (ref 2.0–3.0)

## 2022-06-06 NOTE — Progress Notes (Signed)
Complete physical exam  Patient: Jerome Hicks   DOB: 21-Sep-1951   71 y.o. Male  MRN: [id number]  Subjective:    Chief Complaint  Patient presents with   Annual Exam    Fasting    Discuss weight    Patient would like to discuss prescription for Ozempic.      Jerome Hicks is a 71 y.o. male who presents today for a complete physical exam. He reports consuming a general diet.he says he has really cut back. The patient does not participate in regular exercise at present. He generally feels fairly well. He reports sleeping fairly well. Can only sleep on side or he gets SOB. He does have additional problems to discuss today.    Most recent fall risk assessment:    06/06/2022    8:27 AM  [place] in the past year? 0  Number falls in past yr: 0  Injury with Fall? 0  Risk for fall due to : No Fall Risks  Follow up Falls prevention discussed;Falls evaluation completed     Most recent depression screenings:    12/05/2021   10:23 AM 09/18/2021    9:05 AM  PHQ 2/9 Scores  PHQ - 2 Score 0 0      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)    get yearly PFT's   Ascending aortic aneurysm ([place])    seen on 01-31-18 ct chest ; patient denies awareness   Atrial fibrillation ([place])    Cataracts, bilateral    ED (erectile dysfunction)    has been prescribed Levitra and Viagrain in the past   Hypertension    Internal hemorrhoids    Kidney stones    Leg edema    recurrent cellulitis   Scrotal abscess 3-03   Shoulder arthritis    Sigmoid diverticulosis    Past Surgical History:  Procedure Laterality Date    MRSA infection in left groin requiring surgical debridement  12-08   CHOLECYSTECTOMY  1971   kidney stones  1990's   Ureteroscopic stone extraction   TONSILLECTOMY     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   Procedure: RIGHT TOTAL KNEE ARTHROPLASTY;  Surgeon: Dorna Leitz, MD;  Location: [facility];  Service: Orthopedics;   Laterality: Right;   Social History   Tobacco Use   Smoking status: Never   Smokeless tobacco: Former    Types: Chew    Quit date: 05/21/2021  Vaping Use   Vaping Use: Never used  Substance Use Topics   Alcohol use: No   Drug use: No      Patient Care Team: Hali Marry, MD as PCP - General Darius Bump, [facility] as Pharmacist (Pharmacist)   Outpatient Medications Prior to Visit  Medication Sig   amLODipine (NORVASC) 2.5 MG tablet Take 1 tablet (2.5 mg total) by mouth daily.   Carboxymethylcellul-Glycerin (LUBRICATING EYE DROPS OP) Place 1 drop into both eyes daily as needed (dry eyes).   carvedilol (COREG) 25 MG tablet Take 1 tablet (25 mg total) by mouth 2 (two) times daily with a meal.   clotrimazole-betamethasone (LOTRISONE) cream Apply 1 application topically daily as needed (rash). Dont use for more than 2 weeks at a time   doxazosin (CARDURA) 4 MG tablet TAKE 1 TABLET EVERY DAY   furosemide (LASIX) 40 MG tablet TAKE 1 TO 2 TABLETS DAILY AS NEEDED FOR EDEMA.   meclizine (ANTIVERT) 25 MG  tablet TAKE 1 TABLET THREE TIMES DAILY AS NEEDED  FOR  DIZZINESS  OR  NAUSEA  AS DIRECTED   Multiple Vitamin (MULTIVITAMIN WITH MINERALS) TABS tablet Take 1 tablet by mouth daily.   potassium chloride (KLOR-CON) 10 MEQ tablet TAKE 1 TABLET EVERY DAY   RESTASIS 0.05 % ophthalmic emulsion    traMADol (ULTRAM) 50 MG tablet TAKE 1 TABLET (50 MG TOTAL) BY MOUTH EVERY 8 (EIGHT) HOURS AS NEEDED.   valsartan (DIOVAN) 320 MG tablet TAKE 1 TABLET EVERY DAY   vitamin B-12 (CYANOCOBALAMIN) 1000 MCG tablet Take 1,000 mcg by mouth daily.   warfarin (COUMADIN) 5 MG tablet TAKE '5MG'$  EVERY MONDAY AND THURSDAY. TAKE 2.'5MG'$  ALL OTHER DAYS   [DISCONTINUED] sulfamethoxazole-trimethoprim (BACTRIM DS) 800-160 MG tablet Take 1 tablet by mouth 2 (two) times daily.   No facility-administered medications prior to visit.    ROS        Objective:     BP 139/76   Pulse 64   Resp 18   Ht '6\' 2"'$  (1.88  m)   Wt (!) 323 lb (146.5 kg)   SpO2 96%   BMI 41.47 kg/m     Physical Exam Constitutional:      Appearance: He is well-developed.  HENT:     Head: Normocephalic and atraumatic.     Right Ear: External ear normal.     Left Ear: External ear normal.     Nose: Nose normal.  Eyes:     Conjunctiva/sclera: Conjunctivae normal.     Pupils: Pupils are equal, round, and reactive to light.  Neck:     Thyroid: No thyromegaly.  Cardiovascular:     Rate and Rhythm: Normal rate and regular rhythm.     Heart sounds: Normal heart sounds.  Pulmonary:     Effort: Pulmonary effort is normal.     Breath sounds: Normal breath sounds.  Abdominal:     General: Bowel sounds are normal. There is no distension.     Palpations: Abdomen is soft. There is no mass.     Tenderness: There is no abdominal tenderness. There is no guarding or rebound.  Musculoskeletal:        General: Normal range of motion.     Cervical back: Normal range of motion and neck supple.  Lymphadenopathy:     Cervical: No cervical adenopathy.  Skin:    General: Skin is warm and dry.  Neurological:     Mental Status: He is alert and oriented to person, place, and time.     Deep Tendon Reflexes: Reflexes are normal and symmetric.  Psychiatric:        Behavior: Behavior normal.        Thought Content: Thought content normal.        Judgment: Judgment normal.      Results for orders placed or performed in visit on 06/06/22  POCT INR  Result Value Ref Range   INR 2.3 2.0 - 3.0        Assessment & Plan:    Routine Health Maintenance and Physical Exam  Immunization History  Administered Date(s) Administered   Fluad Quad(high Dose 65+) 08/24/2020, 09/08/2021   Influenza Split 08/21/2012   Influenza Whole 08/27/2008, 11/07/2009, 10/18/2010, 08/20/2011   Influenza, High Dose Seasonal PF 08/26/2017, 08/08/2018, 08/03/2019   Influenza,inj,Quad PF,6+ Mos 08/25/2013, 08/05/2014, 08/31/2015, 08/28/2016   PFIZER(Purple  Top)SARS-COV-2 Vaccination 02/12/2020, 03/05/2020   Pneumococcal Conjugate-13 09/29/2013   Pneumococcal Polysaccharide-23 07/14/2015   Td 12/03/1994, 06/15/2009   Zoster  Recombinat (Shingrix) 06/07/2018, 08/21/2018   Zoster, Live 06/18/2011    Health Maintenance  Topic Date Due   TETANUS/TDAP  12/03/2022 (Originally 06/16/2019)   COVID-19 Vaccine (3 - Pfizer series) 04/09/2023 (Originally 04/30/2020)   INFLUENZA VACCINE  07/03/2022   COLONOSCOPY (Pts 45-60yr Insurance coverage will need to be confirmed)  07/13/2022   Pneumonia Vaccine 71 Years old  Completed   Hepatitis C Screening  Completed   Zoster Vaccines- Shingrix  Completed   HPV VACCINES  Aged Out    Discussed health benefits of physical activity, and encouraged him to engage in regular exercise appropriate for his age and condition.  Problem List Items Addressed This Visit       Cardiovascular and Mediastinum   [place]   Relevant Orders   POCT INR (Completed)   Ambulatory referral to Cardiology     Respiratory   Asbestosis ([facility]   Relevant Orders   CT Chest Wo Contrast   Other Visit Diagnoses     Wellness examination    -  Primary   Relevant Orders   Lipid Panel w/reflex Direct LDL   COMPLETE METABOLIC PANEL WITH GFR   CBC   PSA   Hemoglobin A1c   SOB (shortness of breath)       Relevant Orders   CT Chest Wo Contrast   Ambulatory referral to Cardiology      Keep up a regular exercise program and make sure you are eating a healthy diet Try to eat 4 servings of dairy a day, or if you are lactose intolerant take a calcium with vitamin D daily.  Your vaccines are up to date.  He really wants to work on light weight loss.  He feels like the weight is part of what is making him feel short of breath.  He wanted to know if he could start Ozempic.  He does not have a history of diabetes or prediabetes.  Shortness of breath-he has had intermittent shortness of breath usually when he is volume  overloaded but this has been more persistent over the last several months.  When I last saw him I really felt like he was volume overloaded and that was causing some increased shortness of breath.  He also has a history of underlying asbestosis but its been fairly stable over the years though his yearly checkup is actually at the end of this month.  Concerned because he is short of breath when he lays in bed and he is also short of breath even at rest.  Last echocardiogram in November 2022 showed no signs of heart failure.  Some mild enlargement of the left ventricle.  We will go ahead and place cardiology referral.  We will get CT of chest for further work-up.  Increase Lasix to 3 times daily as he is up about another 6 pounds from the last time I saw him.  Return in about 3 months (around 09/06/2022).     CBeatrice Lecher MD

## 2022-06-06 NOTE — Progress Notes (Signed)
6 min. Walk test performed. Patient unable to complete full 6 min due to shortness of breath. Patients O2 levels started at 95% and maintained 97% for 4 minutes. Patient placed on 2 liters of O2 to see if shortness of breath would improve while walking. Patient did not noticed a difference on O2.

## 2022-06-06 NOTE — Patient Instructions (Signed)
Increase lasix to 3 tabs daily Only drink 50 ounces of fluid a day.

## 2022-06-07 ENCOUNTER — Other Ambulatory Visit: Payer: Medicare HMO

## 2022-06-07 LAB — CBC
HCT: 39.6 % (ref 38.5–50.0)
Hemoglobin: 13.1 g/dL — ABNORMAL LOW (ref 13.2–17.1)
MCH: 32 pg (ref 27.0–33.0)
MCHC: 33.1 g/dL (ref 32.0–36.0)
MCV: 96.8 fL (ref 80.0–100.0)
MPV: 11.2 fL (ref 7.5–12.5)
Platelets: 167 10*3/uL (ref 140–400)
RBC: 4.09 10*6/uL — ABNORMAL LOW (ref 4.20–5.80)
RDW: 11.9 % (ref 11.0–15.0)
WBC: 6.3 10*3/uL (ref 3.8–10.8)

## 2022-06-07 LAB — COMPLETE METABOLIC PANEL WITH GFR
AG Ratio: 1.5 (calc) (ref 1.0–2.5)
ALT: 18 U/L (ref 9–46)
AST: 20 U/L (ref 10–35)
Albumin: 3.8 g/dL (ref 3.6–5.1)
Alkaline phosphatase (APISO): 64 U/L (ref 35–144)
BUN: 19 mg/dL (ref 7–25)
CO2: 30 mmol/L (ref 20–32)
Calcium: 9.2 mg/dL (ref 8.6–10.3)
Chloride: 108 mmol/L (ref 98–110)
Creat: 0.99 mg/dL (ref 0.70–1.28)
Globulin: 2.5 g/dL (calc) (ref 1.9–3.7)
Glucose, Bld: 108 mg/dL — ABNORMAL HIGH (ref 65–99)
Potassium: 4.4 mmol/L (ref 3.5–5.3)
Sodium: 144 mmol/L (ref 135–146)
Total Bilirubin: 2.6 mg/dL — ABNORMAL HIGH (ref 0.2–1.2)
Total Protein: 6.3 g/dL (ref 6.1–8.1)
eGFR: 81 mL/min/{1.73_m2} (ref >=60)

## 2022-06-07 LAB — HEMOGLOBIN A1C
Hgb A1c MFr Bld: 4.9 % of total Hgb (ref <5.7)
Mean Plasma Glucose: 94 mg/dL
eAG (mmol/L): 5.2 mmol/L

## 2022-06-07 LAB — LIPID PANEL W/REFLEX DIRECT LDL
Cholesterol: 124 mg/dL (ref <200)
HDL: 42 mg/dL (ref >=40)
LDL Cholesterol (Calc): 66 mg/dL (calc)
Non-HDL Cholesterol (Calc): 82 mg/dL (calc) (ref <130)
Total CHOL/HDL Ratio: 3 (calc) (ref <5.0)
Triglycerides: 78 mg/dL (ref <150)

## 2022-06-07 LAB — PSA: PSA: 2.32 ng/mL (ref <=4.00)

## 2022-06-07 NOTE — Progress Notes (Signed)
Call patient: metabolic panel is normal. No sign of infection. Cholesterol looks good.  Prostate test is normal.  A1C looks great!

## 2022-06-08 ENCOUNTER — Encounter (HOSPITAL_COMMUNITY): Payer: Self-pay

## 2022-06-08 ENCOUNTER — Encounter (HOSPITAL_COMMUNITY): Payer: Medicare HMO

## 2022-06-08 ENCOUNTER — Encounter (HOSPITAL_COMMUNITY): Admission: RE | Admit: 2022-06-08 | Payer: Medicare HMO | Source: Ambulatory Visit

## 2022-06-09 DIAGNOSIS — I7121 Aneurysm of the ascending aorta, without rupture: Secondary | ICD-10-CM | POA: Insufficient documentation

## 2022-06-09 NOTE — Progress Notes (Deleted)
Cardiology Office Note   Date:  06/09/2022   ID:  Jerome Hicks, DOB 1951-08-26, MRN [id number]  PCP:  Hali Marry, MD  Cardiologist:   None Referring:  ***  No chief complaint on file.     History of Present Illness: Jerome Hicks is a 71 y.o. male who presents for evaluation of atrial fib.  ***       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)    get yearly PFT's   Ascending aortic aneurysm ([place])    seen on 01-31-18 ct chest ; patient denies awareness   Atrial fibrillation ([place])    Cataracts, bilateral    ED (erectile dysfunction)    has been prescribed Levitra and Viagrain in the past   Hypertension    Internal hemorrhoids    Kidney stones    Leg edema    recurrent cellulitis   Scrotal abscess 3-03   Shoulder arthritis    Sigmoid diverticulosis     Past Surgical History:  Procedure Laterality Date    MRSA infection in left groin requiring surgical debridement  12-08   CHOLECYSTECTOMY  1971   kidney stones  1990's   Ureteroscopic stone extraction   TONSILLECTOMY     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   Procedure: RIGHT TOTAL KNEE ARTHROPLASTY;  Surgeon: Dorna Leitz, MD;  Location: [facility];  Service: Orthopedics;  Laterality: Right;     Current Outpatient Medications  Medication Sig Dispense Refill   amLODipine (NORVASC) 2.5 MG tablet Take 1 tablet (2.5 mg total) by mouth daily. 90 tablet 0   Carboxymethylcellul-Glycerin (LUBRICATING EYE DROPS OP) Place 1 drop into both eyes daily as needed (dry eyes).     carvedilol (COREG) 25 MG tablet Take 1 tablet (25 mg total) by mouth 2 (two) times daily with a meal. 180 tablet 3   clotrimazole-betamethasone (LOTRISONE) cream Apply 1 application topically daily as needed (rash). Dont use for more than 2 weeks at a time 45 g 1   doxazosin (CARDURA) 4 MG tablet TAKE 1 TABLET EVERY DAY 90 tablet 3   furosemide (LASIX) 40 MG tablet TAKE 1 TO 2 TABLETS DAILY AS NEEDED FOR  EDEMA. 180 tablet 0   meclizine (ANTIVERT) 25 MG tablet TAKE 1 TABLET THREE TIMES DAILY AS NEEDED  FOR  DIZZINESS  OR  NAUSEA  AS DIRECTED 90 tablet 1   Multiple Vitamin (MULTIVITAMIN WITH MINERALS) TABS tablet Take 1 tablet by mouth daily.     potassium chloride (KLOR-CON) 10 MEQ tablet TAKE 1 TABLET EVERY DAY 90 tablet 1   RESTASIS 0.05 % ophthalmic emulsion      traMADol (ULTRAM) 50 MG tablet TAKE 1 TABLET (50 MG TOTAL) BY MOUTH EVERY 8 (EIGHT) HOURS AS NEEDED. 90 tablet 0   valsartan (DIOVAN) 320 MG tablet TAKE 1 TABLET EVERY DAY 90 tablet 1   vitamin B-12 (CYANOCOBALAMIN) 1000 MCG tablet Take 1,000 mcg by mouth daily.     warfarin (COUMADIN) 5 MG tablet TAKE '5MG'$  EVERY MONDAY AND THURSDAY. TAKE 2.'5MG'$  ALL OTHER DAYS 60 tablet 1   No current facility-administered medications for this visit.    Allergies:   Penicillins    Social History:  The patient  reports that he has never smoked. He quit smokeless tobacco use about 12 months ago.  His smokeless tobacco use included chew. He reports that he does not drink alcohol and does not use drugs.  Family History:  The patient's ***family history includes Breast cancer in his sister; Heart attack (age of onset: 33) in his sister; Heart attack (age of onset: 48) in his brother; Heart attack (age of onset: 12) in his mother; Heart attack (age of onset: 68) in his father; Multiple sclerosis in his daughter.    ROS:  Please see the history of present illness.   Otherwise, review of systems are positive for {NONE DEFAULTED:18576}.   All other systems are reviewed and negative.    PHYSICAL EXAM: VS:  There were no vitals taken for this visit. , BMI There is no height or weight on file to calculate BMI. GENERAL:  Well appearing HEENT:  Pupils equal round and reactive, fundi not visualized, oral mucosa unremarkable NECK:  No jugular venous distention, waveform within normal limits, carotid upstroke brisk and symmetric, no bruits, no  thyromegaly LYMPHATICS:  No cervical, inguinal adenopathy LUNGS:  Clear to auscultation bilaterally BACK:  No CVA tenderness CHEST:  Unremarkable HEART:  PMI not displaced or sustained,S1 and S2 within normal limits, no S3, no clicks, no rubs, *** murmurs, irregular  ABD:  Flat, positive bowel sounds normal in frequency in pitch, no bruits, no rebound, no guarding, no midline pulsatile mass, no hepatomegaly, no splenomegaly EXT:  2 plus pulses throughout, no edema, no cyanosis no clubbing SKIN:  No rashes no nodules NEURO:  Cranial nerves II through XII grossly intact, motor grossly intact throughout PSYCH:  Cognitively intact, oriented to person place and time    EKG:  EKG {ACTION; IS/IS EUM:35361443} ordered today. The ekg ordered today demonstrates ***   Recent Labs: 09/08/2021: Brain Natriuretic Peptide 236; TSH 4.16 06/06/2022: ALT 18; BUN 19; Creat 0.99; Hemoglobin 13.1; Platelets 167; Potassium 4.4; Sodium 144    Lipid Panel    Component Value Date/Time   CHOL 124 06/06/2022 0949   TRIG 78 06/06/2022 0949   HDL 42 06/06/2022 0949   CHOLHDL 3.0 06/06/2022 0949   VLDL 9 07/01/2015 1429   LDLCALC 66 06/06/2022 0949      Wt Readings from Last 3 Encounters:  06/06/22 (!) 323 lb (146.5 kg)  05/03/22 (!) 318 lb (144.2 kg)  04/20/22 (!) 317 lb (143.8 kg)      Other studies Reviewed: Additional studies/ records that were reviewed today include: ***. Review of the above records demonstrates:  Please see elsewhere in the note.  ***   ASSESSMENT AND PLAN:  Atrial fib:  ***  Ascending aortic aneurysm:  ***     Current medicines are reviewed at length with the patient today.  The patient {ACTIONS; HAS/DOES NOT HAVE:19233} concerns regarding medicines.  The following changes have been made:  {PLAN; NO CHANGE:13088:s}  Labs/ tests ordered today include: *** No orders of the defined types were placed in this encounter.    Disposition:   FU with ***     Signed, Minus Breeding, MD  06/09/2022 1:51 PM    [facility]

## 2022-06-11 ENCOUNTER — Ambulatory Visit: Payer: Medicare HMO | Admitting: Cardiology

## 2022-06-11 DIAGNOSIS — I7121 Aneurysm of the ascending aorta, without rupture: Secondary | ICD-10-CM

## 2022-06-11 DIAGNOSIS — I482 Chronic atrial fibrillation, unspecified: Secondary | ICD-10-CM

## 2022-06-12 ENCOUNTER — Ambulatory Visit (INDEPENDENT_AMBULATORY_CARE_PROVIDER_SITE_OTHER): Payer: Medicare HMO | Admitting: Pharmacist

## 2022-06-12 DIAGNOSIS — I10 Essential (primary) hypertension: Secondary | ICD-10-CM

## 2022-06-12 DIAGNOSIS — N1831 Chronic kidney disease, stage 3a: Secondary | ICD-10-CM

## 2022-06-12 DIAGNOSIS — I4891 Unspecified atrial fibrillation: Secondary | ICD-10-CM

## 2022-06-12 NOTE — Progress Notes (Unsigned)
Chronic Care Management Pharmacy Note  06/13/2022 Name:  Jerome Hicks MRN:  [id number] DOB:  [birth year]  Summary: addressed HTN, Afib, CKD. Patient is stable with all medications and doing well.  BP checks at home: 115/75, 135/71  He is seeking options for weight loss medication.  Recommendations/Changes made from today's visit: none at this time, will review options for patient assistance programs and discuss with patient at next visit.  Plan: f/u with pharmacist in 2-3 weeks  Subjective: Jerome Hicks is an 71 y.o. year old male who is a primary patient of Metheney, Rene Kocher, MD.  The CCM team was consulted for assistance with disease management and care coordination needs.    Engaged with patient by telephone for follow up visit in response to provider referral for pharmacy case management and/or care coordination services.   Consent to Services:  The patient was given information about Chronic Care Management services, agreed to services, and gave verbal consent prior to initiation of services.  Please see initial visit note for detailed documentation.   Patient Care Team: Hali Marry, MD as PCP - General Darius Bump, [facility] as Pharmacist (Pharmacist)  Objective:  Lab Results  Component Value Date   CREATININE 0.99 06/06/2022   CREATININE 1.08 12/11/2021   CREATININE 1.04 09/08/2021       Component Value Date/Time   CHOL 124 06/06/2022 0949   TRIG 78 06/06/2022 0949   HDL 42 06/06/2022 0949   CHOLHDL 3.0 06/06/2022 0949   VLDL 9 07/01/2015 1429   LDLCALC 66 06/06/2022 0949       Latest Ref Rng & Units 06/06/2022    9:49 AM 06/13/2021   12:00 AM 06/23/2020   10:29 AM  Hepatic Function  Total Protein 6.1 - 8.1 g/dL 6.3  6.4  6.4   AST 10 - 35 U/L '20  21  23   '$ ALT 9 - 46 U/L '18  24  24   '$ Total Bilirubin 0.2 - 1.2 mg/dL 2.6  2.6  3.3     Lab Results  Component Value Date/Time   TSH 4.16 09/08/2021 12:00 AM   TSH 3.18 04/30/2017 08:43 AM        Latest Ref Rng & Units 06/06/2022    9:49 AM 06/13/2021   12:00 AM 06/23/2020   10:29 AM  CBC  WBC 3.8 - 10.8 Thousand/uL 6.3  6.3  6.4   Hemoglobin 13.2 - 17.1 g/dL 13.1  14.5  14.5   Hematocrit 38.5 - 50.0 % 39.6  42.5  42.7   Platelets 140 - 400 Thousand/uL 167  209  211    Social History   Tobacco Use  Smoking Status Never  Smokeless Tobacco Former   Types: Chew   Quit date: 05/21/2021   BP Readings from Last 3 Encounters:  06/06/22 139/76  05/17/22 132/72  05/07/22 (!) 91/59   Pulse Readings from Last 3 Encounters:  06/06/22 64  05/17/22 (!) 59  05/07/22 97   Wt Readings from Last 3 Encounters:  06/06/22 (!) 323 lb (146.5 kg)  05/03/22 (!) 318 lb (144.2 kg)  04/20/22 (!) 317 lb (143.8 kg)    Assessment: Review of patient past medical history, allergies, medications, health status, including review of consultants reports, laboratory and other test data, was performed as part of comprehensive evaluation and provision of chronic care management services.   SDOH:  (Social Determinants of Health) assessments and interventions performed:    CCM Care Plan  Allergies  Allergen Reactions   Penicillins     Childhood allergy Has patient had a PCN reaction causing immediate rash, facial/tongue/throat swelling, SOB or lightheadedness with hypotension: Unknown Has patient had a PCN reaction causing severe rash involving mucus membranes or skin necrosis: Unknown Has patient had a PCN reaction that required hospitalization: Unknown Has patient had a PCN reaction occurring within the last 10 years: No If all of the above answers are "NO", then may proceed with Cephalosporin use.     Medications Reviewed Today     Reviewed by Hali Marry, MD (Physician) on 06/06/22 at 1021  Med List Status: <None>   Medication Order Taking? Sig Documenting Provider Last Dose Status Informant  amLODipine (NORVASC) 2.5 MG tablet [id number] Yes Take 1 tablet (2.5 mg total) by  mouth daily. Hali Marry, MD Taking Active   Carboxymethylcellul-Glycerin [facility] EYE DROPS OP) [id number] Yes Place 1 drop into both eyes daily as needed (dry eyes). [provider] Taking Active Self  carvedilol (COREG) 25 MG tablet [id number] Yes Take 1 tablet (25 mg total) by mouth 2 (two) times daily with a meal. Hali Marry, MD Taking Active   clotrimazole-betamethasone Donalynn Furlong) cream [id number] Yes Apply 1 application topically daily as needed (rash). Dont use for more than 2 weeks at a time Hali Marry, MD Taking Active   doxazosin (CARDURA) 4 MG tablet [id number] Yes TAKE 1 TABLET EVERY DAY Hali Marry, MD Taking Active   furosemide (LASIX) 40 MG tablet [id number] Yes TAKE 1 TO 2 TABLETS DAILY AS NEEDED FOR EDEMA. Hali Marry, MD Taking Active            Med Note Lewie Chamber Dec 25, 2021  8:00 AM) Patient takes 1 tablet alternating with 2 tablets   meclizine (ANTIVERT) 25 MG tablet [id number] Yes TAKE 1 TABLET THREE TIMES DAILY AS NEEDED  FOR  DIZZINESS  OR  NAUSEA  AS DIRECTED Hali Marry, MD Taking Active   Multiple Vitamin (MULTIVITAMIN WITH MINERALS) TABS tablet [id number] Yes Take 1 tablet by mouth daily. [provider] Taking Active Self  potassium chloride (KLOR-CON) 10 MEQ tablet [id number] Yes TAKE 1 TABLET EVERY DAY Hali Marry, MD Taking Active   RESTASIS 0.05 % ophthalmic emulsion [id number] Yes  [provider] Taking Active   traMADol (ULTRAM) 50 MG tablet [id number] Yes TAKE 1 TABLET (50 MG TOTAL) BY MOUTH EVERY 8 (EIGHT) HOURS AS NEEDED. Donella Stade, PA-C Taking Active   valsartan (DIOVAN) 320 MG tablet [id number] Yes TAKE 1 TABLET EVERY DAY Hali Marry, MD Taking Active   vitamin B-12 (CYANOCOBALAMIN) 1000 MCG tablet [id number] Yes Take 1,000 mcg by mouth daily. [provider] Taking Active Self  warfarin (COUMADIN) 5 MG tablet [id number] Yes TAKE  '5MG'$  EVERY MONDAY AND THURSDAY. TAKE 2.'5MG'$  ALL OTHER DAYS Hali Marry, MD Taking Active             Patient Active Problem List   Diagnosis Date Noted   Aneurysm of ascending aorta without rupture ([place]) 06/09/2022   Coronary artery disease due to calcified coronary lesion 03/10/2019   Total knee replacement status 08/02/2018   Thoracic aortic ectasia ([place]) 02/06/2018   History of arthroplasty of right knee 10/21/2017   BMI 37.0-37.9, adult 04/27/2016   Glenohumeral arthritis of both shoulders 04/14/2015   Gilbert's disease 08/24/2014   Traumatic avulsion of nail plate of toe 45/36/4680   Hypogonadism male 02/26/2014   Lower leg  edema 12/02/2013   Lymph node enlargement 12/02/2013   Stasis dermatitis of both legs 12/01/2013   Beta-hemolytic group B streptococcal sepsis ([place]) 12/01/2013   CKD (chronic kidney disease) stage 3, GFR 30-59 ml/min ([facility]) 05/28/2013   Atrial fibrillation ([place]) 06/25/2012   Asbestosis ([place]) 01/12/2011   Restrictive lung disease 01/12/2011   Cardiac arrhythmia 05/26/2008   ERECTILE DYSFUNCTION 01/06/2008   ESSENTIAL HYPERTENSION, BENIGN 01/05/2008   Venous (peripheral) insufficiency 01/05/2008    Immunization History  Administered Date(s) Administered   Fluad Quad(high Dose 65+) 08/24/2020, 09/08/2021   Influenza Split 08/21/2012   Influenza Whole 08/27/2008, 11/07/2009, 10/18/2010, 08/20/2011   Influenza, High Dose Seasonal PF 08/26/2017, 08/08/2018, 08/03/2019   Influenza,inj,Quad PF,6+ Mos 08/25/2013, 08/05/2014, 08/31/2015, 08/28/2016   PFIZER(Purple Top)SARS-COV-2 Vaccination 02/12/2020, 03/05/2020   Pneumococcal Conjugate-13 09/29/2013   Pneumococcal Polysaccharide-23 07/14/2015   Td 12/03/1994, 06/15/2009   Zoster Recombinat (Shingrix) 06/07/2018, 08/21/2018   Zoster, Live 06/18/2011    Conditions to be addressed/monitored: Atrial Fibrillation and HTN  Care Plan : Medication Management  Updates made by Darius Bump, [place]  since 06/13/2022 12:00 AM     Problem: Afib, HTN, CKD3      Long-Range Goal: Disease Progression Prevention   Note:   Current Barriers:  none at present  Pharmacist Clinical Goal(s):  Over the next 180 days, patient will maintain control of BP, Afib as evidenced by home BP/HR readings, taking medication as prescribed  through collaboration with PharmD and provider.   Interventions: 1:1 collaboration with Hali Marry, MD regarding development and update of comprehensive plan of care as evidenced by provider attestation and co-signature Inter-disciplinary care team collaboration (see longitudinal plan of care) Comprehensive medication review performed; medication list updated in electronic medical record  Hypertension:  Controlled; current treatment: carvedilol 6.'25mg'$  BID, doxazosin '4mg'$  daily, lisinopril '40mg'$  daily, furosemide '40mg'$  daily;   Current home readings: on a good day = 138/83, on a bad day = 148/90s   Denies hypotensive/hypertensive symptoms  Recommended continue current medications,  Atrial Fibrillation:  Controlled; current rate/rhythm control carvedilol; anticoagulant treatment: warfarin  CHADS2VASc score: 3 (3.2% annual stroke risk)  Previously ducated on stroke risk, HR control (through carvedilol), and s/sx of bleeding, though patient does very well on time in therapeutic range (TITR).  Also discussed diet & that patient is allowed to eat leafy greens/vitamin K containing foods, but that the KEY is consistency (I.e. not 8 salads one day and none the next) Recommended continue current medications, and  CKD Stage 3  Medications evaluated for renal function safety/efficacy  No concerns, continue current regimen  Patient Goals/Self-Care Activities Over the next 21 days, patient will:  take medications as prescribed and engage in dietary modifications by portion control & incorporating more exercise (patient-stated goal of weight loss)  Follow Up Plan:  Telephone appointment with care management team member scheduled for: 2-3 weeks      Medication Assistance: None required.  Patient affirms current coverage meets needs.  Patient's preferred pharmacy is:  [facility] [address] [place] [address] Phone: [phone] Fax: [phone]  [place], [place] [address] Phone: [phone] Fax: [phone]   Follow Up:  Patient agrees to Care Plan and Follow-up.  Plan: Telephone follow up appointment with care management team member scheduled for:  2-3 weeks  Larinda Buttery, PharmD Clinical Pharmacist [place]  [place] [phone]

## 2022-06-13 ENCOUNTER — Other Ambulatory Visit: Payer: Self-pay | Admitting: Family Medicine

## 2022-06-13 DIAGNOSIS — I10 Essential (primary) hypertension: Secondary | ICD-10-CM

## 2022-06-13 NOTE — Patient Instructions (Signed)
Visit Information  Thank you for taking time to visit with me today. Please don't hesitate to contact me if I can be of assistance to you before our next scheduled telephone appointment.  Following are the goals we discussed today:  Patient Goals/Self-Care Activities Over the next 21 days, patient will:  take medications as prescribed and engage in dietary modifications by portion control & incorporating more exercise (patient-stated goal of weight loss)  Follow Up Plan: Telephone appointment with care management team member scheduled for: 2-3 weeks  Please call the care guide team at [phone] if you need to cancel or reschedule your appointment.   The patient verbalized understanding of instructions, educational materials, and care plan provided today and agreed to receive a mailed copy of patient instructions, educational materials, and care plan.   Darius Bump

## 2022-06-14 ENCOUNTER — Ambulatory Visit: Payer: Medicare HMO

## 2022-06-15 ENCOUNTER — Ambulatory Visit: Payer: Medicare HMO

## 2022-06-20 ENCOUNTER — Telehealth: Payer: Self-pay | Admitting: Family Medicine

## 2022-06-20 DIAGNOSIS — I10 Essential (primary) hypertension: Secondary | ICD-10-CM

## 2022-06-20 MED ORDER — AMLODIPINE BESYLATE 2.5 MG PO TABS: 2.5000 mg | ORAL_TABLET | Freq: Every day | ORAL | 1 refills | Status: DC

## 2022-06-20 NOTE — Telephone Encounter (Signed)
Pt called.  He has two weeks left on his amlodipine.  He wants a script sent to [facility]

## 2022-07-02 DIAGNOSIS — I10 Essential (primary) hypertension: Secondary | ICD-10-CM

## 2022-07-02 DIAGNOSIS — N1831 Chronic kidney disease, stage 3a: Secondary | ICD-10-CM

## 2022-07-02 DIAGNOSIS — I4891 Unspecified atrial fibrillation: Secondary | ICD-10-CM

## 2022-07-09 ENCOUNTER — Ambulatory Visit (INDEPENDENT_AMBULATORY_CARE_PROVIDER_SITE_OTHER): Payer: Medicare HMO | Admitting: Pharmacist

## 2022-07-09 DIAGNOSIS — I4891 Unspecified atrial fibrillation: Secondary | ICD-10-CM

## 2022-07-09 DIAGNOSIS — I10 Essential (primary) hypertension: Secondary | ICD-10-CM

## 2022-07-09 DIAGNOSIS — N1831 Chronic kidney disease, stage 3a: Secondary | ICD-10-CM

## 2022-07-09 NOTE — Patient Instructions (Signed)
Visit Information  Thank you for taking time to visit with me today. Please don't hesitate to contact me if I can be of assistance to you before our next scheduled telephone appointment.  Following are the goals we discussed today:    Patient Goals/Self-Care Activities Over the next 90 days, patient will:  take medications as prescribed and engage in dietary modifications by portion control & incorporating more exercise (patient-stated goal of weight loss)  Follow Up Plan: Telephone appointment with care management team member scheduled for: 3-6 months  Please call the care guide team at [phone] if you need to cancel or reschedule your appointment.   The patient verbalized understanding of instructions, educational materials, and care plan provided today and agreed to receive a mailed copy of patient instructions, educational materials, and care plan.   Darius Bump

## 2022-07-09 NOTE — Progress Notes (Signed)
Chronic Care Management Pharmacy Note  07/09/2022 Name:  Jerome Hicks MRN:  [id number] DOB:  February 08, 1951  Summary: addressed HTN, Afib, CKD. Patient is stable with all medications and doing well. He is seeking options for weight loss medication. Provided thorough investigation of options for obtaining GLP1 agonist for weight loss.  Recommendations/Changes made from today's visit: none at this time, unfortunately medication requested is cost prohibitive. A1c yields no diagnosis of diabetes or pre-diabetes. May consider evaluation/diagnosis for metabolic syndrome, or at high risk for cardiovascular disease,  Z91.89 , and then pursuing patient assistance program for ozempic. Patient is pending establish care visit with cardiologist.  Plan: f/u with pharmacist in 2-3 weeks  Subjective: Jerome Hicks is an 71 y.o. year old male who is a primary patient of Metheney, Rene Kocher, MD.  The CCM team was consulted for assistance with disease management and care coordination needs.    Engaged with patient by telephone for follow up visit in response to provider referral for pharmacy case management and/or care coordination services.   Consent to Services:  The patient was given information about Chronic Care Management services, agreed to services, and gave verbal consent prior to initiation of services.  Please see initial visit note for detailed documentation.   Patient Care Team: Hali Marry, MD as PCP - General Darius Bump, [facility] as Pharmacist (Pharmacist)  Objective:  Lab Results  Component Value Date   CREATININE 0.99 06/06/2022   CREATININE 1.08 12/11/2021   CREATININE 1.04 09/08/2021       Component Value Date/Time   CHOL 124 06/06/2022 0949   TRIG 78 06/06/2022 0949   HDL 42 06/06/2022 0949   CHOLHDL 3.0 06/06/2022 0949   VLDL 9 07/01/2015 1429   LDLCALC 66 06/06/2022 0949       Latest Ref Rng & Units 06/06/2022    9:49 AM 06/13/2021   12:00 AM 06/23/2020    10:29 AM  Hepatic Function  Total Protein 6.1 - 8.1 g/dL 6.3  6.4  6.4   AST 10 - 35 U/L '20  21  23   '$ ALT 9 - 46 U/L '18  24  24   '$ Total Bilirubin 0.2 - 1.2 mg/dL 2.6  2.6  3.3     Lab Results  Component Value Date/Time   TSH 4.16 09/08/2021 12:00 AM   TSH 3.18 04/30/2017 08:43 AM       Latest Ref Rng & Units 06/06/2022    9:49 AM 06/13/2021   12:00 AM 06/23/2020   10:29 AM  CBC  WBC 3.8 - 10.8 Thousand/uL 6.3  6.3  6.4   Hemoglobin 13.2 - 17.1 g/dL 13.1  14.5  14.5   Hematocrit 38.5 - 50.0 % 39.6  42.5  42.7   Platelets 140 - 400 Thousand/uL 167  209  211    Social History   Tobacco Use  Smoking Status Never  Smokeless Tobacco Former   Types: Chew   Quit date: 05/21/2021   BP Readings from Last 3 Encounters:  06/06/22 139/76  05/17/22 132/72  05/07/22 (!) 91/59   Pulse Readings from Last 3 Encounters:  06/06/22 64  05/17/22 (!) 59  05/07/22 97   Wt Readings from Last 3 Encounters:  06/06/22 (!) 323 lb (146.5 kg)  05/03/22 (!) 318 lb (144.2 kg)  04/20/22 (!) 317 lb (143.8 kg)    Assessment: Review of patient past medical history, allergies, medications, health status, including review of consultants reports, laboratory and other  test data, was performed as part of comprehensive evaluation and provision of chronic care management services.   SDOH:  (Social Determinants of Health) assessments and interventions performed:    CCM Care Plan  Allergies  Allergen Reactions   Penicillins     Childhood allergy Has patient had a PCN reaction causing immediate rash, facial/tongue/throat swelling, SOB or lightheadedness with hypotension: Unknown Has patient had a PCN reaction causing severe rash involving mucus membranes or skin necrosis: Unknown Has patient had a PCN reaction that required hospitalization: Unknown Has patient had a PCN reaction occurring within the last 10 years: No If all of the above answers are "NO", then may proceed with Cephalosporin use.      Medications Reviewed Today     Reviewed by Hali Marry, MD (Physician) on 06/06/22 at 1021  Med List Status: <None>   Medication Order Taking? Sig Documenting Provider Last Dose Status Informant  amLODipine (NORVASC) 2.5 MG tablet [id number] Yes Take 1 tablet (2.5 mg total) by mouth daily. Hali Marry, MD Taking Active   Carboxymethylcellul-Glycerin [facility] EYE DROPS OP) [id number] Yes Place 1 drop into both eyes daily as needed (dry eyes). [provider] Taking Active Self  carvedilol (COREG) 25 MG tablet [id number] Yes Take 1 tablet (25 mg total) by mouth 2 (two) times daily with a meal. Hali Marry, MD Taking Active   clotrimazole-betamethasone Donalynn Furlong) cream [id number] Yes Apply 1 application topically daily as needed (rash). Dont use for more than 2 weeks at a time Hali Marry, MD Taking Active   doxazosin (CARDURA) 4 MG tablet [id number] Yes TAKE 1 TABLET EVERY DAY Hali Marry, MD Taking Active   furosemide (LASIX) 40 MG tablet [id number] Yes TAKE 1 TO 2 TABLETS DAILY AS NEEDED FOR EDEMA. Hali Marry, MD Taking Active            Med Note Lewie Chamber Dec 25, 2021  8:00 AM) Patient takes 1 tablet alternating with 2 tablets   meclizine (ANTIVERT) 25 MG tablet [id number] Yes TAKE 1 TABLET THREE TIMES DAILY AS NEEDED  FOR  DIZZINESS  OR  NAUSEA  AS DIRECTED Hali Marry, MD Taking Active   Multiple Vitamin (MULTIVITAMIN WITH MINERALS) TABS tablet [id number] Yes Take 1 tablet by mouth daily. [provider] Taking Active Self  potassium chloride (KLOR-CON) 10 MEQ tablet [id number] Yes TAKE 1 TABLET EVERY DAY Hali Marry, MD Taking Active   RESTASIS 0.05 % ophthalmic emulsion [id number] Yes  [provider] Taking Active   traMADol (ULTRAM) 50 MG tablet [id number] Yes TAKE 1 TABLET (50 MG TOTAL) BY MOUTH EVERY 8 (EIGHT) HOURS AS NEEDED. Donella Stade, PA-C Taking Active    valsartan (DIOVAN) 320 MG tablet [id number] Yes TAKE 1 TABLET EVERY DAY Hali Marry, MD Taking Active   vitamin B-12 (CYANOCOBALAMIN) 1000 MCG tablet [id number] Yes Take 1,000 mcg by mouth daily. [provider] Taking Active Self  warfarin (COUMADIN) 5 MG tablet [id number] Yes TAKE '5MG'$  EVERY MONDAY AND THURSDAY. TAKE 2.'5MG'$  ALL OTHER DAYS Hali Marry, MD Taking Active             Patient Active Problem List   Diagnosis Date Noted   Aneurysm of ascending aorta without rupture ([place]) 06/09/2022   Coronary artery disease due to calcified coronary lesion 03/10/2019   Total knee replacement status 08/02/2018   Thoracic aortic ectasia ([place]) 02/06/2018   History of arthroplasty of right knee 10/21/2017  BMI 37.0-37.9, adult 04/27/2016   Glenohumeral arthritis of both shoulders 04/14/2015   Gilbert's disease 08/24/2014   Traumatic avulsion of nail plate of toe 16/09/9603   Hypogonadism male 02/26/2014   Lower leg edema 12/02/2013   Lymph node enlargement 12/02/2013   Stasis dermatitis of both legs 12/01/2013   Beta-hemolytic group B streptococcal sepsis ([place]) 12/01/2013   CKD (chronic kidney disease) stage 3, GFR 30-59 ml/min (HCC) 05/28/2013   Atrial fibrillation ([place]) 06/25/2012   Asbestosis ([place]) 01/12/2011   Restrictive lung disease 01/12/2011   Cardiac arrhythmia 05/26/2008   ERECTILE DYSFUNCTION 01/06/2008   ESSENTIAL HYPERTENSION, BENIGN 01/05/2008   Venous (peripheral) insufficiency 01/05/2008    Immunization History  Administered Date(s) Administered   Fluad Quad(high Dose 65+) 08/24/2020, 09/08/2021   Influenza Split 08/21/2012   Influenza Whole 08/27/2008, 11/07/2009, 10/18/2010, 08/20/2011   Influenza, High Dose Seasonal PF 08/26/2017, 08/08/2018, 08/03/2019   Influenza,inj,Quad PF,6+ Mos 08/25/2013, 08/05/2014, 08/31/2015, 08/28/2016   PFIZER(Purple Top)SARS-COV-2 Vaccination 02/12/2020, 03/05/2020   Pneumococcal Conjugate-13 09/29/2013    Pneumococcal Polysaccharide-23 07/14/2015   Td 12/03/1994, 06/15/2009   Zoster Recombinat (Shingrix) 06/07/2018, 08/21/2018   Zoster, Live 06/18/2011    Conditions to be addressed/monitored: Atrial Fibrillation and HTN  Care Plan : Medication Management  Updates made by Darius Bump, [place] since 07/09/2022 12:00 AM     Problem: Afib, HTN, CKD3      Long-Range Goal: Disease Progression Prevention   Note:   Current Barriers:  none at present  Pharmacist Clinical Goal(s):  Over the next 180 days, patient will maintain control of BP, Afib as evidenced by home BP/HR readings, taking medication as prescribed  through collaboration with PharmD and provider.   Interventions: 1:1 collaboration with Hali Marry, MD regarding development and update of comprehensive plan of care as evidenced by provider attestation and co-signature Inter-disciplinary care team collaboration (see longitudinal plan of care) Comprehensive medication review performed; medication list updated in electronic medical record  Hypertension:  Controlled; current treatment: carvedilol 6.'25mg'$  BID, doxazosin '4mg'$  daily, lisinopril '40mg'$  daily, furosemide '40mg'$  daily;   Current home readings: on a good day = 138/83, on a bad day = 148/90s   Denies hypotensive/hypertensive symptoms  Recommended continue current medications,  Atrial Fibrillation:  Controlled; current rate/rhythm control carvedilol; anticoagulant treatment: warfarin  CHADS2VASc score: 3 (3.2% annual stroke risk)  Previously ducated on stroke risk, HR control (through carvedilol), and s/sx of bleeding, though patient does very well on time in therapeutic range (TITR).  Also discussed diet & that patient is allowed to eat leafy greens/vitamin K containing foods, but that the KEY is consistency (I.e. not 8 salads one day and none the next) Recommended continue current medications, and  CKD Stage 3  Medications evaluated for renal function  safety/efficacy  No concerns, continue current regimen  Patient Goals/Self-Care Activities Over the next 90 days, patient will:  take medications as prescribed and engage in dietary modifications by portion control & incorporating more exercise (patient-stated goal of weight loss)  Follow Up Plan: Telephone appointment with care management team member scheduled for: 3-6 months       Medication Assistance: None required.  Patient affirms current coverage meets needs.  Patient's preferred pharmacy is:  [facility] [address] [place] [address] Phone: [phone] Fax: [phone]  [facility] [place], [place] [address] Phone: [phone] Fax: [phone]   Follow  Up:  Patient agrees to Care Plan and Follow-up.  Plan: Telephone follow up appointment with care management team member scheduled for:  2-3 weeks  Larinda Buttery, PharmD Clinical Pharmacist [facility] [phone]

## 2022-07-16 ENCOUNTER — Ambulatory Visit (INDEPENDENT_AMBULATORY_CARE_PROVIDER_SITE_OTHER): Payer: Medicare HMO | Admitting: Family Medicine

## 2022-07-16 VITALS — BP 132/71 | HR 72

## 2022-07-16 DIAGNOSIS — I4891 Unspecified atrial fibrillation: Secondary | ICD-10-CM | POA: Diagnosis not present

## 2022-07-16 LAB — POCT INR: INR: 2.4 (ref 2.0–3.0)

## 2022-07-16 NOTE — Progress Notes (Signed)
See anticoagulation flowsheet for adjustments.

## 2022-07-22 ENCOUNTER — Other Ambulatory Visit: Payer: Self-pay | Admitting: Family Medicine

## 2022-07-22 DIAGNOSIS — I10 Essential (primary) hypertension: Secondary | ICD-10-CM

## 2022-07-23 ENCOUNTER — Ambulatory Visit: Payer: Medicare HMO | Admitting: Cardiology

## 2022-07-24 DIAGNOSIS — I7789 Other specified disorders of arteries and arterioles: Secondary | ICD-10-CM | POA: Insufficient documentation

## 2022-07-24 NOTE — Progress Notes (Unsigned)
Cardiology Office Note   Date:  07/25/2022   ID:  Dervin, Vore Mar 04, 1951, MRN [id number]  PCP:  Hali Marry, MD  Cardiologist:   None Referring:  Hali Marry, *  Chief Complaint  Patient presents with   Shortness of Breath    History of Present Illness: Jerome Hicks is a 71 y.o. male who presents for evaluation of SOB.  He is referred by Hali Marry, MD. he has atrial fibrillation although he did not really recall this.  His wife remembers being told this.  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  He tolerates anticoagulation.  He has chronic lower extremity swelling.  I do see an echo in 2022 demonstrated an EF of 55%.  He had aortic dilatation of 4.2 cm.  He has not had any other cardiac work-up.  He has not had cardiac catheterization.  He does not recall stress testing.  He has asbestosis and chronic dyspnea though he is not on oxygen.  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  He battles lower extremity swelling.  This is been worse since he has gained about 40 pounds.  He gained weight because he been eating more sugar after he stopped using tobacco his wife says about a year ago.  He does not describe PND or orthopnea though he can only sleep on his right side he can sleep flat.  He has had increased lower extremity swelling and takes Lasix most days but not always.  He tries to watch his salt.  He does drink a fair amount of fluid.  He has chronic shortness of breath walking just 50 yards on level ground.  He is not describing chest pressure, neck or arm discomfort.  He has a very strong family history for early coronary artery disease.   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)    get yearly PFT's   Ascending aortic aneurysm ([place])    seen on 01-31-18 ct chest ; patient denies awareness   Atrial fibrillation ([place])    Cataracts, bilateral    ED (erectile  dysfunction)    has been prescribed Levitra and Viagrain in the past   Hypertension    Internal hemorrhoids    Kidney stones    Leg edema    recurrent cellulitis   Scrotal abscess 3-03   Shoulder arthritis    Sigmoid diverticulosis     Past Surgical History:  Procedure Laterality Date    MRSA infection in left groin requiring surgical debridement  12-08   CHOLECYSTECTOMY  1971   kidney stones  1990's   Ureteroscopic stone extraction   TONSILLECTOMY     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   Procedure: RIGHT TOTAL KNEE ARTHROPLASTY;  Surgeon: Dorna Leitz, MD;  Location: [facility];  Service: Orthopedics;  Laterality: Right;     Current Outpatient Medications  Medication Sig Dispense Refill   amLODipine (NORVASC) 2.5 MG tablet Take 1 tablet by mouth once daily 90 tablet 1   carvedilol (COREG) 25 MG tablet Take 1 tablet (25 mg total) by mouth 2 (two) times daily with a meal. 180 tablet 3   clotrimazole-betamethasone (LOTRISONE) cream Apply 1 application topically daily as needed (rash). Dont use for more than 2 weeks at a time 45 g 1   doxazosin (CARDURA) 4 MG tablet TAKE 1 TABLET EVERY DAY 90 tablet  3   furosemide (LASIX) 40 MG tablet TAKE 1 TO 2 TABLETS DAILY AS NEEDED FOR EDEMA. 180 tablet 0   meclizine (ANTIVERT) 25 MG tablet TAKE 1 TABLET THREE TIMES DAILY AS NEEDED  FOR  DIZZINESS  OR  NAUSEA  AS DIRECTED 90 tablet 1   Multiple Vitamin (MULTIVITAMIN WITH MINERALS) TABS tablet Take 1 tablet by mouth daily.     potassium chloride (KLOR-CON M) 10 MEQ tablet TAKE 1 TABLET EVERY DAY 90 tablet 1   traMADol (ULTRAM) 50 MG tablet TAKE 1 TABLET (50 MG TOTAL) BY MOUTH EVERY 8 (EIGHT) HOURS AS NEEDED. 90 tablet 0   valsartan (DIOVAN) 320 MG tablet TAKE 1 TABLET EVERY DAY 90 tablet 1   warfarin (COUMADIN) 5 MG tablet TAKE '5MG'$  EVERY MONDAY AND THURSDAY. TAKE 2.'5MG'$  ALL OTHER DAYS 60 tablet 1   No current facility-administered medications for this visit.    Allergies:   Penicillins     Social History:  The patient  reports that he has never smoked. He quit smokeless tobacco use about 14 months ago.  His smokeless tobacco use included chew. He reports that he does not drink alcohol and does not use drugs.   Family History:  The patient's family history includes Breast cancer in his sister; Heart attack (age of onset: 24) in his sister; Heart attack (age of onset: 53) in his brother; Heart attack (age of onset: 42) in his mother; Heart attack (age of onset: 62) in his father; Multiple sclerosis in his daughter.    ROS:  Please see the history of present illness.   Otherwise, review of systems are positive for none.   All other systems are reviewed and negative.    PHYSICAL EXAM: VS:  BP 118/80   Pulse 64   Ht '6\' 2"'$  (1.88 m)   Wt (!) 315 lb 9.6 oz (143.2 kg)   SpO2 96%   BMI 40.52 kg/m  , BMI Body mass index is 40.52 kg/m. GENERAL:  Well appearing HEENT:  Pupils equal round and reactive, fundi not visualized, oral mucosa unremarkable NECK:  No jugular venous distention, waveform within normal limits, carotid upstroke brisk and symmetric, no bruits, no thyromegaly LYMPHATICS:  No cervical, inguinal adenopathy LUNGS:  Clear to auscultation bilaterally BACK:  No CVA tenderness CHEST:  Unremarkable HEART:  PMI not displaced or sustained,S1 and S2 within normal limits, no S3, no clicks, no rubs, no murmurs, irregular ABD:  Flat, positive bowel sounds normal in frequency in pitch, no bruits, no rebound, no guarding, no midline pulsatile mass, no hepatomegaly, no splenomegaly EXT:  2 plus pulses throughout, severe lower extremity edema, no cyanosis no clubbing, chronic venous stasis changes SKIN:  No rashes no nodules NEURO:  Cranial nerves II through XII grossly intact, motor grossly intact throughout PSYCH:  Cognitively intact, oriented to person place and time    EKG:  EKG is ordered today. The ekg ordered today demonstrates atrial fibrillation with slow  ventricular rate, rate 64, low voltage in the limb and chest leads,   Recent Labs: 09/08/2021: Brain Natriuretic Peptide 236; TSH 4.16 06/06/2022: ALT 18; BUN 19; Creat 0.99; Hemoglobin 13.1; Platelets 167; Potassium 4.4; Sodium 144    Lipid Panel    Component Value Date/Time   CHOL 124 06/06/2022 0949   TRIG 78 06/06/2022 0949   HDL 42 06/06/2022 0949   CHOLHDL 3.0 06/06/2022 0949   VLDL 9 07/01/2015 1429   LDLCALC 66 06/06/2022 0949      Wt Readings  from Last 3 Encounters:  07/25/22 (!) 315 lb 9.6 oz (143.2 kg)  06/06/22 (!) 323 lb (146.5 kg)  05/03/22 (!) 318 lb (144.2 kg)      Other studies Reviewed: Additional studies/ records that were reviewed today include: Labs. Review of the above records demonstrates:  Please see elsewhere in the note.     ASSESSMENT AND PLAN:  SOB:   This may be primarily pulmonary and related to asbestosis.  However, with his significant cardiovascular risk factors I do want exclude ischemia.  I like to try to schedule him for a PET  Atrial fib: It looks like this is a chronic rhythm.  The rate is controlled.  He is already on anticoagulation.  His CHA2DS2-VASc score looks to be about 2.  HTN: Blood pressures controlled.  He will continue the meds as listed.  Aortic root enlargement: I will follow this up with repeat CT scanning in the future.  Morbid obesity: We began to talk about weight loss goals and strategies.  Ultimately he might be a GLP-1 receptor agonist candidate and I will consider this at my follow-up.  Edema:   I like him to take his Lasix 80 mg for the next 3 days and then 40 mg every day.  In a week and going to check a BNP, basic metabolic profile.  TSH was okay.   Current medicines are reviewed at length with the patient today.  The patient does not have concerns regarding medicines.  The following changes have been made: As above  Labs/ tests ordered today include:   Orders Placed This Encounter  Procedures   NM PET  CT CARDIAC PERFUSION MULTI W/ABSOLUTE BLOODFLOW   Brain natriuretic peptide   Basic metabolic panel   EKG 87-OMVE     Disposition:   FU with APP after the PET scan and then me after that.   Signed, Minus Breeding, MD  07/25/2022 10:24 AM    [facility]

## 2022-07-25 ENCOUNTER — Ambulatory Visit: Payer: Medicare HMO | Admitting: Cardiology

## 2022-07-25 ENCOUNTER — Encounter: Payer: Self-pay | Admitting: Cardiology

## 2022-07-25 ENCOUNTER — Encounter: Payer: Self-pay | Admitting: *Deleted

## 2022-07-25 VITALS — BP 118/80 | HR 64 | Ht 74.0 in | Wt 315.6 lb

## 2022-07-25 DIAGNOSIS — R0602 Shortness of breath: Secondary | ICD-10-CM | POA: Diagnosis not present

## 2022-07-25 DIAGNOSIS — I10 Essential (primary) hypertension: Secondary | ICD-10-CM

## 2022-07-25 DIAGNOSIS — I7789 Other specified disorders of arteries and arterioles: Secondary | ICD-10-CM

## 2022-07-25 NOTE — Patient Instructions (Signed)
Medication Instructions:    For the next 3 days take 80 mg of Lasix ( fluid pill)  then return to 40 mg a daily   *If you need a refill on your cardiac medications before your next appointment, please call your pharmacy*   Lab Work: labs in 7 days BNP BMP If you have labs (blood work) drawn today and your tests are completely normal, you will receive your results only by: [place] (if you have MyChart) OR A paper copy in the mail If you have any lab test that is abnormal or we need to change your treatment, we will call you to review the results.   Testing/Procedures:  Will be schedule at [facility] Radiology , (PET scanning), is a noninvasive, special x-ray that produces cross-sectional images of the body using x-rays and a computer. PET scans help physicians diagnose and treat medical conditions. For some PET exams, a contrast material is used to enhance visibility in the area of the body being studied. PET scans provide greater clarity and reveal more details than regular x-ray exams.    Follow-Up: At [facility], you and your health needs are our priority.  As part of our continuing mission to provide you with exceptional heart care, we have created designated Provider Care Teams.  These Care Teams include your primary Cardiologist (physician) and Advanced Practice Providers (APPs -  Physician Assistants and Nurse Practitioners) who all work together to provide you with the care you need, when you need it.  We recommend signing up for the patient portal called "MyChart".  Sign up information is provided on this After Visit Summary.  MyChart is used to connect with patients for Virtual Visits (Telemedicine).  Patients are able to view lab/test results, encounter notes, upcoming appointments, etc.  Non-urgent messages can be sent to your provider as well.   To learn more about what you can do with MyChart, go to [email or web address].    Your next appointment:   3 month(s)  after PET  scan is completed  The format for your next appointment:   In Person  Provider:   Coletta Memos, FNP, Fabian Sharp, PA-C, Caron Presume, PA-C, Almyra Deforest, PA-C, or Diona Browner, NP     .    Other Instructions   Important Information About Sugar

## 2022-07-27 ENCOUNTER — Telehealth: Payer: Self-pay | Admitting: *Deleted

## 2022-07-27 NOTE — Telephone Encounter (Signed)
Called patient . Informed  patient will be mailing instruction for upcoming PET Scan.  The information was not given during the office visit.  Patient voiced understanding the info would be mailed. That Radiology will still call him about time and place of test.

## 2022-08-01 ENCOUNTER — Telehealth: Payer: Self-pay | Admitting: Family Medicine

## 2022-08-01 MED ORDER — TRAMADOL HCL 50 MG PO TABS: 50.0000 mg | ORAL_TABLET | Freq: Three times a day (TID) | ORAL | 0 refills | Status: DC | PRN

## 2022-08-01 NOTE — Telephone Encounter (Signed)
Meds ordered this encounter  Medications   traMADol (ULTRAM) 50 MG tablet    Sig: Take 1 tablet (50 mg total) by mouth every 8 (eight) hours as needed.    Dispense:  90 tablet    Refill:  0    

## 2022-08-01 NOTE — Telephone Encounter (Signed)
LVM informing pt of RX.  Charyl Bigger, CMA

## 2022-08-01 NOTE — Telephone Encounter (Signed)
[facility] called in this morning requesting a refill on Tramadol,'50mg'$  tablets.

## 2022-08-02 DIAGNOSIS — I4891 Unspecified atrial fibrillation: Secondary | ICD-10-CM

## 2022-08-02 DIAGNOSIS — N1831 Chronic kidney disease, stage 3a: Secondary | ICD-10-CM

## 2022-08-02 DIAGNOSIS — I129 Hypertensive chronic kidney disease with stage 1 through stage 4 chronic kidney disease, or unspecified chronic kidney disease: Secondary | ICD-10-CM | POA: Diagnosis not present

## 2022-08-03 DIAGNOSIS — R0602 Shortness of breath: Secondary | ICD-10-CM | POA: Diagnosis not present

## 2022-08-03 DIAGNOSIS — I10 Essential (primary) hypertension: Secondary | ICD-10-CM | POA: Diagnosis not present

## 2022-08-03 DIAGNOSIS — I7789 Other specified disorders of arteries and arterioles: Secondary | ICD-10-CM | POA: Diagnosis not present

## 2022-08-04 LAB — BASIC METABOLIC PANEL
BUN/Creatinine Ratio: 21 (ref 10–24)
BUN: 21 mg/dL (ref 8–27)
CO2: 25 mmol/L (ref 20–29)
Calcium: 9.3 mg/dL (ref 8.6–10.2)
Chloride: 108 mmol/L — ABNORMAL HIGH (ref 96–106)
Creatinine, Ser: 1 mg/dL (ref 0.76–1.27)
Glucose: 123 mg/dL — ABNORMAL HIGH (ref 70–99)
Potassium: 5 mmol/L (ref 3.5–5.2)
Sodium: 149 mmol/L — ABNORMAL HIGH (ref 134–144)
eGFR: 80 mL/min/{1.73_m2} (ref >59)

## 2022-08-04 LAB — BRAIN NATRIURETIC PEPTIDE: BNP: 238.8 pg/mL — ABNORMAL HIGH (ref 0.0–100.0)

## 2022-08-08 ENCOUNTER — Encounter: Payer: Self-pay | Admitting: *Deleted

## 2022-08-13 ENCOUNTER — Other Ambulatory Visit: Payer: Self-pay

## 2022-08-13 ENCOUNTER — Ambulatory Visit (INDEPENDENT_AMBULATORY_CARE_PROVIDER_SITE_OTHER): Payer: Medicare HMO | Admitting: Family Medicine

## 2022-08-13 VITALS — BP 133/85 | HR 66

## 2022-08-13 DIAGNOSIS — I4891 Unspecified atrial fibrillation: Secondary | ICD-10-CM | POA: Diagnosis not present

## 2022-08-13 DIAGNOSIS — Z23 Encounter for immunization: Secondary | ICD-10-CM

## 2022-08-13 LAB — POCT INR: INR: 2.3 (ref 2.0–3.0)

## 2022-08-13 MED ORDER — TRAMADOL HCL 50 MG PO TABS: 50.0000 mg | ORAL_TABLET | Freq: Three times a day (TID) | ORAL | 0 refills | Status: DC | PRN

## 2022-08-13 NOTE — Progress Notes (Signed)
See anticoag flowsheet

## 2022-08-13 NOTE — Telephone Encounter (Signed)
Last prescription went to [facility] instead of [facility]. He would like it sent to [facility].

## 2022-08-20 ENCOUNTER — Other Ambulatory Visit: Payer: Self-pay | Admitting: Cardiology

## 2022-08-20 ENCOUNTER — Telehealth (HOSPITAL_COMMUNITY): Payer: Self-pay | Admitting: *Deleted

## 2022-08-20 ENCOUNTER — Other Ambulatory Visit (HOSPITAL_COMMUNITY): Payer: Self-pay | Admitting: *Deleted

## 2022-08-20 DIAGNOSIS — R0602 Shortness of breath: Secondary | ICD-10-CM

## 2022-08-20 NOTE — Telephone Encounter (Signed)
Reaching out to patient to offer assistance regarding upcoming cardiac imaging study; pt verbalizes understanding of appt date/time, parking situation and where to check in, pre-test NPO status and verified current allergies; name and call back number provided for further questions should they arise  Jerome Clement RN Navigator Cardiac Imaging [facility] [phone] office [phone] cell  Patient aware to avoid caffeine 12 hours prior to his cardiac PET. He states he has issues laying flat for the test. Informed him we would try to work with him on that.

## 2022-08-21 ENCOUNTER — Encounter (HOSPITAL_COMMUNITY): Payer: Self-pay

## 2022-08-21 ENCOUNTER — Encounter (HOSPITAL_COMMUNITY)
Admission: RE | Admit: 2022-08-21 | Discharge: 2022-08-21 | Disposition: A | Discharge status: Home / Self Care | Payer: Medicare HMO | Source: Ambulatory Visit | Attending: Cardiology | Admitting: Cardiology

## 2022-08-21 ENCOUNTER — Other Ambulatory Visit (HOSPITAL_COMMUNITY): Payer: Self-pay | Admitting: *Deleted

## 2022-08-21 DIAGNOSIS — I10 Essential (primary) hypertension: Secondary | ICD-10-CM | POA: Insufficient documentation

## 2022-08-21 DIAGNOSIS — I7789 Other specified disorders of arteries and arterioles: Secondary | ICD-10-CM | POA: Insufficient documentation

## 2022-08-21 DIAGNOSIS — R0602 Shortness of breath: Secondary | ICD-10-CM | POA: Insufficient documentation

## 2022-08-21 NOTE — Progress Notes (Signed)
Patient presented for a cardiac PET scan but we had to abort the scan since patient cannot lay flat.  Dr. Margaretann Loveless in agreement in plan. Patient is aware we will reach out to Dr. Percival Spanish.

## 2022-09-06 ENCOUNTER — Ambulatory Visit: Payer: Medicare HMO | Admitting: Family Medicine

## 2022-09-10 ENCOUNTER — Other Ambulatory Visit: Payer: Self-pay | Admitting: Family Medicine

## 2022-09-10 MED ORDER — TRAMADOL HCL 50 MG PO TABS: 50.0000 mg | ORAL_TABLET | Freq: Three times a day (TID) | ORAL | 0 refills | Status: DC | PRN

## 2022-09-10 NOTE — Telephone Encounter (Signed)
Patient called and is requesting a refill on tramadol, pt has about a week's supply left. Has appointment scheduled for 10/19, please advise. Jerome Hicks

## 2022-09-10 NOTE — Telephone Encounter (Signed)
Tramadol last written 08/13/2022 #90 no refills Last appt 06/06/2022, not on pain contract?

## 2022-09-12 ENCOUNTER — Ambulatory Visit (INDEPENDENT_AMBULATORY_CARE_PROVIDER_SITE_OTHER): Payer: Medicare HMO | Admitting: Family Medicine

## 2022-09-12 VITALS — BP 126/71 | HR 62

## 2022-09-12 DIAGNOSIS — I4891 Unspecified atrial fibrillation: Secondary | ICD-10-CM | POA: Diagnosis not present

## 2022-09-12 LAB — POCT INR: INR: 2.3 (ref 2.0–3.0)

## 2022-09-12 NOTE — Telephone Encounter (Signed)
Did you want to refill this?

## 2022-09-12 NOTE — Telephone Encounter (Signed)
Good catch.  Lets see if he can come by and sign a pain contract on the medication.  If for some reason he cannot come by before the refills to I can always give him 30 days and give him an opportunity to come back in.

## 2022-09-12 NOTE — Progress Notes (Signed)
Patient advised.

## 2022-09-12 NOTE — Progress Notes (Signed)
The anticoagulation flowsheet.

## 2022-09-19 ENCOUNTER — Encounter: Payer: Medicare HMO | Admitting: Family Medicine

## 2022-09-20 ENCOUNTER — Encounter: Payer: Self-pay | Admitting: Family Medicine

## 2022-09-20 ENCOUNTER — Ambulatory Visit (INDEPENDENT_AMBULATORY_CARE_PROVIDER_SITE_OTHER): Payer: Medicare HMO | Admitting: Family Medicine

## 2022-09-20 VITALS — BP 118/66 | HR 57 | Temp 97.8°F | Ht 74.0 in | Wt 317.0 lb

## 2022-09-20 DIAGNOSIS — R600 Localized edema: Secondary | ICD-10-CM | POA: Diagnosis not present

## 2022-09-20 DIAGNOSIS — G8929 Other chronic pain: Secondary | ICD-10-CM | POA: Diagnosis not present

## 2022-09-20 DIAGNOSIS — I10 Essential (primary) hypertension: Secondary | ICD-10-CM

## 2022-09-20 DIAGNOSIS — I872 Venous insufficiency (chronic) (peripheral): Secondary | ICD-10-CM

## 2022-09-20 DIAGNOSIS — Z23 Encounter for immunization: Secondary | ICD-10-CM

## 2022-09-20 DIAGNOSIS — I4891 Unspecified atrial fibrillation: Secondary | ICD-10-CM

## 2022-09-20 DIAGNOSIS — I878 Other specified disorders of veins: Secondary | ICD-10-CM

## 2022-09-20 DIAGNOSIS — Z6838 Body mass index (BMI) 38.0-38.9, adult: Secondary | ICD-10-CM | POA: Insufficient documentation

## 2022-09-20 DIAGNOSIS — E870 Hyperosmolality and hypernatremia: Secondary | ICD-10-CM | POA: Diagnosis not present

## 2022-09-20 DIAGNOSIS — Z6841 Body Mass Index (BMI) 40.0 and over, adult: Secondary | ICD-10-CM

## 2022-09-20 DIAGNOSIS — N1831 Chronic kidney disease, stage 3a: Secondary | ICD-10-CM

## 2022-09-20 MED ORDER — FUROSEMIDE 40 MG PO TABS: 80.0000 mg | ORAL_TABLET | Freq: Every day | ORAL | 1 refills | Status: AC

## 2022-09-20 MED ORDER — FUROSEMIDE 10 MG/ML IJ SOLN
20.0000 mg | Freq: Once | INTRAMUSCULAR | Status: AC
  Administered 2022-09-20: 20 mg via INTRAMUSCULAR

## 2022-09-20 MED ORDER — OZEMPIC (0.25 OR 0.5 MG/DOSE) 2 MG/3ML ~~LOC~~ SOPN: 0.2500 mg | PEN_INJECTOR | SUBCUTANEOUS | 0 refills | Status: DC

## 2022-09-20 NOTE — Assessment & Plan Note (Signed)
BMI of 45 venous insufficiency, hypertension, atrial fibrillation, and restrictive lung disease he would benefit significantly from being on a GLP-1.  He also has CKD 3.

## 2022-09-20 NOTE — Progress Notes (Signed)
Pt c/o SOB at all times. He also has bilateral leg swelling and would like to have his legs wrapped.  He stated that when he was seen by cardiology they mentioned that he would be a candidate for Ozempic.

## 2022-09-20 NOTE — Assessment & Plan Note (Signed)
Pressure looks beautiful today though I do think he still volume overloaded.

## 2022-09-20 NOTE — Patient Instructions (Addendum)
Increase your furosemide to 2 days daily.  Decrease fluids to 50 oz total per day.  Cut back on portions at meal.  Turn in 1 week to recheck blood pressure and swelling in lower extremities.

## 2022-09-20 NOTE — Assessment & Plan Note (Signed)
Currently wearing compression stockings.  Unna boots placed bilaterally.  Lasix 60 mg given IM.  Increase daily furosemide to 80 mg daily.

## 2022-09-20 NOTE — Assessment & Plan Note (Signed)
To recheck renal function.

## 2022-09-20 NOTE — Progress Notes (Addendum)
Established Patient Office Visit  Subjective   Patient ID: Jerome Hicks, male    DOB: [birth year]  Age: 71 y.o. MRN: [id number]  Chief Complaint  Patient presents with   Hypertension   Edema    HPI  Really struggling with shortness of breath.  He has still having some significant swelling in his lower extremities he alternates his furosemide he takes one 1 day into the next day.  He says he feels short of breath even just walking in from his car he has been dealing with shortness of breath for quite some time.  No cough or other upper respiratory symptoms.  Atrial fibrillation on Coumadin.On 2.'5mg'$  6 days per week and '5mg'$  one day per week.  Doing well with no excess bleeding issues.  Hypertension- Pt denies chest pain, SOB, dizziness, or heart palpitations.  Taking meds as directed w/o problems.  Denies medication side effects.    He was also scheduled for cardiac PET scan was unable to lay flat and has yet to reschedule.   He says he talked with the cardiologist about starting Ozempic and they really felt like it would be very helpful for him he is not diabetic but he does have morbid obesity with a BMI of 45 venous insufficiency, hypertension, atrial fibrillation, and restrictive lung disease he would benefit significantly from being on a GLP-1.  He also has CKD 3.  Recently had labs with cardiology and sodium was noted to be elevated.    ROS    Objective:     BP 118/66   Pulse (!) 57   Temp 97.8 F (36.6 C)   Ht '6\' 2"'$  (1.88 m)   Wt (!) 317 lb (143.8 kg)   BMI 40.70 kg/m    Physical Exam Constitutional:      Appearance: He is well-developed.  HENT:     Head: Normocephalic and atraumatic.  Cardiovascular:     Rate and Rhythm: Normal rate and regular rhythm.     Heart sounds: Normal heart sounds.  Pulmonary:     Effort: Pulmonary effort is normal.     Breath sounds: Normal breath sounds.     Comments: SOB with talking Skin:    General: Skin is warm and dry.      Comments: 1+ pitting edema of both legs with pigmentation and scaling.   Neurological:     Mental Status: He is alert and oriented to person, place, and time.  Psychiatric:        Behavior: Behavior normal.      No results found for any visits on 09/20/22.    The ASCVD Risk score (Arnett DK, et al., 2019) failed to calculate for the following reasons:   The valid total cholesterol range is 130 to 320 mg/dL    Assessment & Plan:   Problem List Items Addressed This Visit       Cardiovascular and Mediastinum   Venous (peripheral) insufficiency    Currently wearing compression stockings.  Unna boots placed bilaterally.  Lasix 60 mg given IM.  Increase daily furosemide to 80 mg daily.      Relevant Medications   furosemide (LASIX) 40 MG tablet   ESSENTIAL HYPERTENSION, BENIGN - Primary    Pressure looks beautiful today though I do think he still volume overloaded.      Relevant Medications   furosemide (LASIX) 40 MG tablet   Semaglutide,0.25 or 0.'5MG'$ /DOS, (OZEMPIC, 0.25 OR 0.5 MG/DOSE,) 2 MG/3ML SOPN   Other Relevant Orders  BASIC METABOLIC PANEL WITH GFR   Atrial fibrillation (HCC)   Relevant Medications   furosemide (LASIX) 40 MG tablet   Semaglutide,0.25 or 0.'5MG'$ /DOS, (OZEMPIC, 0.25 OR 0.5 MG/DOSE,) 2 MG/3ML SOPN   Other Relevant Orders   BASIC METABOLIC PANEL WITH GFR     Genitourinary   CKD (chronic kidney disease) stage 3, GFR 30-59 ml/min (HCC)    To recheck renal function.      Relevant Medications   Semaglutide,0.25 or 0.'5MG'$ /DOS, (OZEMPIC, 0.25 OR 0.5 MG/DOSE,) 2 MG/3ML SOPN   Other Relevant Orders   BASIC METABOLIC PANEL WITH GFR     Other   Lower leg edema   Encounter for chronic pain management   BMI 40.0-44.9, adult (HCC)    BMI of 45 venous insufficiency, hypertension, atrial fibrillation, and restrictive lung disease he would benefit significantly from being on a GLP-1.  He also has CKD 3.       Relevant Medications   Semaglutide,0.25 or  0.'5MG'$ /DOS, (OZEMPIC, 0.25 OR 0.5 MG/DOSE,) 2 MG/3ML SOPN   Other Visit Diagnoses     Hypernatremia       Relevant Orders   BASIC METABOLIC PANEL WITH GFR   Need for COVID-19 vaccine       Relevant Orders   Pfizer Fall 2023 Covid-19 Vaccine 34yr and older (Completed)   Venous stasis          Venous stasis-Unna boots placed on both legs.  Natremia-recheck sodium level today.  Return in about 1 week (around 09/27/2022) for Nurse visit for BP and weight check .   I spent 40 minutes on the day of the encounter to include pre-visit record review, face-to-face time with the patient and post visit ordering of test.   CBeatrice Lecher MD

## 2022-09-21 LAB — BASIC METABOLIC PANEL WITH GFR
BUN: 20 mg/dL (ref 7–25)
CO2: 31 mmol/L (ref 20–32)
Calcium: 8.8 mg/dL (ref 8.6–10.3)
Chloride: 106 mmol/L (ref 98–110)
Creat: 0.99 mg/dL (ref 0.70–1.28)
Glucose, Bld: 108 mg/dL — ABNORMAL HIGH (ref 65–99)
Potassium: 4.1 mmol/L (ref 3.5–5.3)
Sodium: 142 mmol/L (ref 135–146)
eGFR: 81 mL/min/{1.73_m2} (ref >=60)

## 2022-09-21 NOTE — Progress Notes (Signed)
Your lab work is within acceptable range and there are no concerning findings.   ?

## 2022-09-24 ENCOUNTER — Telehealth: Payer: Self-pay | Admitting: Family Medicine

## 2022-09-24 DIAGNOSIS — I10 Essential (primary) hypertension: Secondary | ICD-10-CM

## 2022-09-24 NOTE — Telephone Encounter (Signed)
Too early to fill its only been 2 weeks.

## 2022-09-26 ENCOUNTER — Telehealth: Payer: Self-pay | Admitting: General Practice

## 2022-09-26 NOTE — Telephone Encounter (Signed)
Patient had an appointment for 09/26/22 at 11 am for a telephone medicare wellness visit. I called the patient three times and left three voicemail's. I was unable to get in touch with the patient.   He was scheduled for 09/24/22 at 8 am for a telephone medicare wellness visit; but he no showed for that one as well.

## 2022-09-27 ENCOUNTER — Telehealth: Payer: Self-pay

## 2022-09-27 ENCOUNTER — Ambulatory Visit (INDEPENDENT_AMBULATORY_CARE_PROVIDER_SITE_OTHER): Payer: Medicare HMO | Admitting: Family Medicine

## 2022-09-27 VITALS — BP 123/73 | HR 62 | Ht 74.0 in | Wt 311.5 lb

## 2022-09-27 DIAGNOSIS — I10 Essential (primary) hypertension: Secondary | ICD-10-CM

## 2022-09-27 DIAGNOSIS — R600 Localized edema: Secondary | ICD-10-CM | POA: Diagnosis not present

## 2022-09-27 NOTE — Patient Instructions (Signed)
Unna boot removed - BP 123/73 and weight down 6 pounds since last visit.  Per Dr. Madilyn Fireman - need to follow up early Februrary with her.

## 2022-09-27 NOTE — Progress Notes (Signed)
   Established Patient Office Visit  Subjective   Patient ID: Jerome Hicks, male    DOB: [birth year]  Age: 71 y.o. MRN: [id number]  Chief Complaint  Patient presents with   Hypertension   Weight Check    BP down 6 # since last visit    unna boot removal    Nurse visit for BP , weight check and unna boot removal.     HPI  Unna boot removal, BP and weight check - pt weight down 6 pounds since last visit.  He is feeling much better he is not had any drainage from his legs.  ROS    Objective:     BP 123/73   Pulse 62   Ht '6\' 2"'$  (1.88 m)   Wt (!) 311 lb 8 oz (141.3 kg)   SpO2 98%   BMI 39.99 kg/m    Physical Exam   No results found for any visits on 09/27/22.    The ASCVD Risk score (Arnett DK, et al., 2019) failed to calculate for the following reasons:   The valid total cholesterol range is 130 to 320 mg/dL    Assessment & Plan:  Unna boot removed, BP  and weight check- pt weight down 6 pounds since last office visit. BP 123/73. Lower legs evaluated by Dr. Madilyn Fireman after Louretta Parma boot removal- no replacement was needed. She did recommend a follow up with her in early February. Problem List Items Addressed This Visit   None   Return in about 3 months (around 01/08/2023) for follow up with Dr. Madilyn Fireman.    Rae Lips, LPN

## 2022-09-27 NOTE — Telephone Encounter (Signed)
Patient states problem with a prescription at the pharmacy.  Called [facility]  at [place] states Ozempic needing a PA .  Did you receive a request for this already?

## 2022-09-27 NOTE — Progress Notes (Signed)
Pressure looks great, swelling is significantly improved.  No drainage from the areas on either leg.  I think at this point we can just continue to monitor.  He has been doubling up on his Lasix.  Encouraged him to continue with that regimen since it does seem to be helping I think we are going to probably permanently have to stay with a higher dose.  Keep regularly scheduled follow-up in February.  Has INR follow-up in a couple of weeks.

## 2022-09-28 ENCOUNTER — Telehealth: Payer: Self-pay

## 2022-09-28 NOTE — Telephone Encounter (Signed)
Initiated Prior authorization FBX:[id number] (0.25 or 0.5 MG/DOSE) '2MG'$ /3ML pen-injectors Via: Covermymeds Case/Key:[id number] Status: approved  as of 09/28/22 Reason:Coverage Starts on: 12/03/2021 12:00:00 AM, Coverage Ends on: 12/03/2023 12:00:00 AM Notified Pt via: Pt does not have Mychart,called  pt to notify

## 2022-10-10 ENCOUNTER — Ambulatory Visit (INDEPENDENT_AMBULATORY_CARE_PROVIDER_SITE_OTHER): Payer: Medicare HMO | Admitting: Family Medicine

## 2022-10-10 VITALS — BP 140/67 | HR 58 | Ht 74.0 in | Wt 315.7 lb

## 2022-10-10 DIAGNOSIS — Z7901 Long term (current) use of anticoagulants: Secondary | ICD-10-CM

## 2022-10-10 DIAGNOSIS — I4891 Unspecified atrial fibrillation: Secondary | ICD-10-CM

## 2022-10-10 LAB — POCT INR: INR: 2 (ref 2.0–3.0)

## 2022-10-12 NOTE — Telephone Encounter (Signed)
Spoke w/pt and advised him that his medications have been sent to the mail order pharmacy and that he would need to reach out to them about where his medications are.

## 2022-10-12 NOTE — Telephone Encounter (Signed)
Patient called requesting refills on Tramadol would like it sent to [facility] on [address]

## 2022-10-24 ENCOUNTER — Other Ambulatory Visit: Payer: Self-pay

## 2022-10-24 MED ORDER — TRAMADOL HCL 50 MG PO TABS: 50.0000 mg | ORAL_TABLET | Freq: Three times a day (TID) | ORAL | 0 refills | Status: DC | PRN

## 2022-10-24 NOTE — Telephone Encounter (Signed)
Last fill 09/10/2022

## 2022-10-24 NOTE — Telephone Encounter (Signed)
Patient scheduled.

## 2022-10-24 NOTE — Telephone Encounter (Signed)
I did refill his medication but he has been going through a lot of the medicaation in a short period of time. Normalliy it lasts him longer. Is he is getting wrose may need to see Dr. Darene Lamer or will need to sign a pain contract.

## 2022-10-31 ENCOUNTER — Encounter: Payer: Self-pay | Admitting: Nurse Practitioner

## 2022-10-31 ENCOUNTER — Ambulatory Visit: Payer: Medicare HMO | Attending: Nurse Practitioner | Admitting: Nurse Practitioner

## 2022-10-31 VITALS — BP 133/81 | HR 70 | Ht 74.0 in | Wt 310.4 lb

## 2022-10-31 DIAGNOSIS — I7789 Other specified disorders of arteries and arterioles: Secondary | ICD-10-CM

## 2022-10-31 DIAGNOSIS — R600 Localized edema: Secondary | ICD-10-CM

## 2022-10-31 DIAGNOSIS — I10 Essential (primary) hypertension: Secondary | ICD-10-CM | POA: Diagnosis not present

## 2022-10-31 DIAGNOSIS — E669 Obesity, unspecified: Secondary | ICD-10-CM | POA: Diagnosis not present

## 2022-10-31 DIAGNOSIS — R0602 Shortness of breath: Secondary | ICD-10-CM

## 2022-10-31 DIAGNOSIS — I4891 Unspecified atrial fibrillation: Secondary | ICD-10-CM

## 2022-10-31 NOTE — Patient Instructions (Signed)
Medication Instructions:  Your physician recommends that you continue on your current medications as directed. Please refer to the Current Medication list given to you today.   *If you need a refill on your cardiac medications before your next appointment, please call your pharmacy*   Lab Work: Your physician recommends that you complete lab work today.  BMET & BNP   If you have labs (blood work) drawn today and your tests are completely normal, you will receive your results only by: [place] (if you have MyChart) OR A paper copy in the mail If you have any lab test that is abnormal or we need to change your treatment, we will call you to review the results.   Testing/Procedures: Your physician has requested that you have an echocardiogram. Echocardiography is a painless test that uses sound waves to create images of your heart. It provides your doctor with information about the size and shape of your heart and how well your heart's chambers and valves are working. This procedure takes approximately one hour. There are no restrictions for this procedure. Please do NOT wear cologne, perfume, aftershave, or lotions (deodorant is allowed). Please arrive 15 minutes prior to your appointment time.    Follow-Up: At [facility], you and your health needs are our priority.  As part of our continuing mission to provide you with exceptional heart care, we have created designated Provider Care Teams.  These Care Teams include your primary Cardiologist (physician) and Advanced Practice Providers (APPs -  Physician Assistants and Nurse Practitioners) who all work together to provide you with the care you need, when you need it.  We recommend signing up for the patient portal called "MyChart".  Sign up information is provided on this After Visit Summary.  MyChart is used to connect with patients for Virtual Visits (Telemedicine).  Patients are able to view lab/test results, encounter  notes, upcoming appointments, etc.  Non-urgent messages can be sent to your provider as well.   To learn more about what you can do with MyChart, go to [email or web address].    Your next appointment:   3-4 month(s)  The format for your next appointment:   In Person  Provider:   Minus Breeding, MD  or Diona Browner, NP        Other Instructions   Important Information About Sugar

## 2022-10-31 NOTE — Progress Notes (Addendum)
Office Visit    Patient Name: Jerome Hicks Date of Encounter: 10/31/2022  Primary Care Provider:  Hali Marry, MD Primary Cardiologist:  Minus Breeding, MD  Chief Complaint    71 year old male with a history of permanent atrial fibrillation, chronic lower extremity edema, shortness of breath, hypertension, aortic root enlargement, DVT, asbestosis, and obesity who presents for follow-up related to shortness of breath.   Past Medical History    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)    get yearly PFT's   Ascending aortic aneurysm ([place])    seen on 01-31-18 ct chest ; patient denies awareness   Atrial fibrillation ([place])    Cataracts, bilateral    ED (erectile dysfunction)    has been prescribed Levitra and Viagrain in the past   Hypertension    Internal hemorrhoids    Kidney stones    Leg edema    recurrent cellulitis   Scrotal abscess 3-03   Shoulder arthritis    Sigmoid diverticulosis    Past Surgical History:  Procedure Laterality Date    MRSA infection in left groin requiring surgical debridement  12-08   CHOLECYSTECTOMY  1971   kidney stones  1990's   Ureteroscopic stone extraction   TONSILLECTOMY     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   Procedure: RIGHT TOTAL KNEE ARTHROPLASTY;  Surgeon: Dorna Leitz, MD;  Location: [facility] ORS;  Service: Orthopedics;  Laterality: Right;    Allergies  Allergies  Allergen Reactions   Penicillins     Childhood allergy Has patient had a PCN reaction causing immediate rash, facial/tongue/throat swelling, SOB or lightheadedness with hypotension: Unknown Has patient had a PCN reaction causing severe rash involving mucus membranes or skin necrosis: Unknown Has patient had a PCN reaction that required hospitalization: Unknown Has patient had a PCN reaction occurring within the last 10 years: No If all of the above answers are "NO", then may proceed with Cephalosporin use.      History of Present Illness    71 year old male with the above past medical history including permanent atrial fibrillation, chronic lower extremity edema, shortness of breath, hypertension, aortic root enlargement, DVT, asbestosis, and obesity.  Echocardiogram in 10/2021 showed EF 55%, normal biventricular function, mild LVH, no significant valvular abnormalities, aortic root dilation 4.2 cm.  He is on anticoagulation with Coumadin in the setting of chronic atrial fibrillation/DVT.  He was last seen in the office on 07/25/2022 and noted ongoing bilateral lower extremity edema, chronic shortness of breath.  Cardiac PET stress test was recommended.  He was advised to increase his Lasix to 80 mg daily for 3 days followed by Lasix 40 mg daily.  He presents today for follow-up accompanied by his wife.  Since his last visit been stable from a cardiac standpoint.  He does note an improvement in his lower extremity edema, he has stable chronic dyspnea.  He denies chest pain.  He was unable to complete his cardiac PET stress test due to his inability to lie flat.  He started taking Ozempic this week and is hoping this will help him lose weight and allow him to increase his activity.   Home Medications    Current Outpatient Medications  Medication Sig Dispense Refill   amLODipine (NORVASC) 2.5 MG tablet Take 1 tablet by mouth once daily 90 tablet 1   carvedilol (COREG) 25 MG tablet Take 1 tablet (25 mg total) by mouth 2 (two) times daily  with a meal. 180 tablet 3   clotrimazole-betamethasone (LOTRISONE) cream Apply 1 application topically daily as needed (rash). Dont use for more than 2 weeks at a time 45 g 1   doxazosin (CARDURA) 4 MG tablet TAKE 1 TABLET EVERY DAY 90 tablet 10   furosemide (LASIX) 40 MG tablet Take 2 tablets (80 mg total) by mouth daily. TAKE 1 TO 2 TABLETS DAILY AS NEEDED FOR EDEMA. 180 tablet 1   meclizine (ANTIVERT) 25 MG tablet TAKE 1 TABLET THREE TIMES DAILY AS NEEDED  FOR   DIZZINESS  OR  NAUSEA  AS DIRECTED 90 tablet 1   Multiple Vitamin (MULTIVITAMIN WITH MINERALS) TABS tablet Take 1 tablet by mouth daily.     potassium chloride (KLOR-CON M) 10 MEQ tablet TAKE 1 TABLET EVERY DAY 90 tablet 1   Semaglutide,0.25 or 0.'5MG'$ /DOS, (OZEMPIC, 0.25 OR 0.5 MG/DOSE,) 2 MG/3ML SOPN Inject 0.25 mg into the skin every 7 (seven) days. 3 mL 0   traMADol (ULTRAM) 50 MG tablet Take 1 tablet (50 mg total) by mouth every 8 (eight) hours as needed. 90 tablet 0   valsartan (DIOVAN) 320 MG tablet TAKE 1 TABLET EVERY DAY 90 tablet 1   warfarin (COUMADIN) 5 MG tablet TAKE '5MG'$  EVERY MONDAY AND THURSDAY. TAKE 2.'5MG'$  ALL OTHER DAYS 60 tablet 1   No current facility-administered medications for this visit.     Review of Systems    He denies chest pain, palpitations, pnd, orthopnea, n, v, dizziness, syncope, weight gain, or early satiety. All other systems reviewed and are otherwise negative except as noted above.   Physical Exam    VS:  BP 133/81   Pulse 70   Ht '6\' 2"'$  (1.88 m)   Wt (!) 310 lb 6.4 oz (140.8 kg)   SpO2 96%   BMI 39.85 kg/m   GEN: Well nourished, well developed, in no acute distress. HEENT: normal. Neck: Supple, no JVD, carotid bruits, or masses. Cardiac: IRIR, no murmurs, rubs, or gallops. No clubbing, cyanosis, nonpitting bilateral lower extremity edema.  Radials/DP/PT 2+ and equal bilaterally.  Respiratory:  Respirations regular and unlabored, clear to auscultation bilaterally. GI: Soft, nontender, nondistended, BS + x 4. MS: no deformity or atrophy. Skin: warm and dry, no rash. Neuro:  Strength and sensation are intact. Psych: Normal affect.  Accessory Clinical Findings    ECG personally reviewed by me today -atrial fibrillation, 70 bpm- no acute changes.   Lab Results  Component Value Date   WBC 6.3 06/06/2022   HGB 13.1 (L) 06/06/2022   HCT 39.6 06/06/2022   MCV 96.8 06/06/2022   PLT 167 06/06/2022   Lab Results  Component Value Date   CREATININE  0.99 09/20/2022   BUN 20 09/20/2022   NA 142 09/20/2022   K 4.1 09/20/2022   CL 106 09/20/2022   CO2 31 09/20/2022   Lab Results  Component Value Date   ALT 18 06/06/2022   AST 20 06/06/2022   ALKPHOS 68 04/30/2017   BILITOT 2.6 (H) 06/06/2022   Lab Results  Component Value Date   CHOL 124 06/06/2022   HDL 42 06/06/2022   LDLCALC 66 06/06/2022   TRIG 78 06/06/2022   CHOLHDL 3.0 06/06/2022    Lab Results  Component Value Date   HGBA1C 4.9 06/06/2022    Assessment & Plan    1. Shortness of breath:  Echo in 10/2021 showed EF 55%, normal biventricular function, mild LVH, no significant valvular abnormalities, aortic root dilation 4.2 cm.  Likely multifactorial as he does have a history of asbestosis.  He thinks his weight could be contributing to his symptoms.  He was unable to complete cardiac PET stress test due to inability to lie flat.  Discussed with Dr. Percival Spanish, primary cardiologist, will defer further ischemic evaluation at this time as his symptoms are stable, his swelling has improved with increased Lasix dosing, and given new Ozempic as below.  Continue to monitor.  2. Chronic bilateral lower extremity edema: He has noted an improvement in his lower extremity edema with increased Lasix dosing.  He is currently alternating Lasix 40 mg daily and Lasix 80 mg daily every other day.  Stable chronic dyspnea.  Will check BMET, BNP today.  Repeat echo pending as below.  Continue carvedilol, valsartan, Lasix.  3. Permanent atrial fibrillation: Controlled, continue carvedilol, warfarin.  4. Hypertension: BP well controlled. Continue current antihypertensive regimen.   5. Aortic root enlargement: Measured 4.2 cm in 10/2021.  He is unable to lie flat for CT chest/aorta. Therefore, will repeat echocardiogram for ongoing monitoring.  6. Obesity: Recently started on Ozempic.  Encouraged activity as tolerated.  7. Disposition: Follow-up in 3 to 4 months.      Lenna Sciara,  NP 10/31/2022, 11:59 AM

## 2022-11-01 LAB — BASIC METABOLIC PANEL
BUN/Creatinine Ratio: 17 (ref 10–24)
BUN: 18 mg/dL (ref 8–27)
CO2: 31 mmol/L — ABNORMAL HIGH (ref 20–29)
Calcium: 9.2 mg/dL (ref 8.6–10.2)
Chloride: 103 mmol/L (ref 96–106)
Creatinine, Ser: 1.07 mg/dL (ref 0.76–1.27)
Glucose: 106 mg/dL — ABNORMAL HIGH (ref 70–99)
Potassium: 4.4 mmol/L (ref 3.5–5.2)
Sodium: 143 mmol/L (ref 134–144)
eGFR: 74 mL/min/{1.73_m2} (ref >59)

## 2022-11-01 LAB — BRAIN NATRIURETIC PEPTIDE: BNP: 135 pg/mL — ABNORMAL HIGH (ref 0.0–100.0)

## 2022-11-06 ENCOUNTER — Telehealth: Payer: Self-pay

## 2022-11-06 NOTE — Telephone Encounter (Signed)
Spoke with pt. Pt was notified of lab results and recommendations. Pt will continue his current medication and follow up as planned.

## 2022-11-07 ENCOUNTER — Ambulatory Visit (INDEPENDENT_AMBULATORY_CARE_PROVIDER_SITE_OTHER): Payer: Medicare HMO | Admitting: Family Medicine

## 2022-11-07 ENCOUNTER — Ambulatory Visit: Payer: Self-pay | Admitting: Family Medicine

## 2022-11-07 ENCOUNTER — Encounter: Payer: Self-pay | Admitting: Family Medicine

## 2022-11-07 VITALS — BP 138/71 | HR 68 | Ht 74.0 in | Wt 311.0 lb

## 2022-11-07 DIAGNOSIS — M25561 Pain in right knee: Secondary | ICD-10-CM | POA: Diagnosis not present

## 2022-11-07 DIAGNOSIS — G8929 Other chronic pain: Secondary | ICD-10-CM | POA: Diagnosis not present

## 2022-11-07 DIAGNOSIS — I4891 Unspecified atrial fibrillation: Secondary | ICD-10-CM

## 2022-11-07 LAB — POCT INR: POC INR: 2.7

## 2022-11-07 NOTE — Progress Notes (Signed)
Acute Office Visit  Subjective:     Patient ID: Jerome Hicks, male    DOB: 28-Nov-1951, 71 y.o.   MRN: [id number]  Chief Complaint  Patient presents with   Knee Pain   Atrial Fibrillation    Knee Pain   Atrial Fibrillation Symptoms are negative for chest pain, palpitations and shortness of breath. Past medical history includes atrial fibrillation.   Patient is a 71 year old male presenting to clinic with right knee pain status post arthroplasty 2019. He states ever since the replacement he has had no relief in knee pain. He followed up with surgeon 2-3 times and discussed concerns, but they never did anything for it. The pain is worse with weight bearing and walking. He takes Tramadol as needed for pain with minimal pain relief. He also has some swelling in knee and its warm to the touch. He has experienced some numbness and tingling sensation over right knee. In June 2023, he had similar concerns and [place] and x-ray were performed. Ultrasound showed no evidence of DVT and x-ray showed well seated prosthesis and no abnormalities. He went to get a bone scan but was able to complete due to complete due to shortness of breath with laying down.   Weight Management - Patient has been using Ozempic for 2 weeks now. He says he has had positive results. No nausea or vomiting. No injection site reactions.   Atrial Fibrillation - He is getting his INR checked for coumadin.   Review of Systems  Respiratory:  Negative for shortness of breath.   Cardiovascular:  Negative for chest pain and palpitations.  Musculoskeletal:        Right knee pain and swelling  Neurological:        Tingling over right knee  All other systems reviewed and are negative.       Objective:    BP 138/71 (BP Location: Left Arm, Patient Position: Sitting, Cuff Size: Large)   Pulse 68   Ht '6\' 2"'$  (1.88 m)   Wt (!) 141.1 kg   SpO2 99%   BMI 39.94 kg/m    Physical Exam Constitutional:      Appearance: Normal  appearance.  Cardiovascular:     Rate and Rhythm: Normal rate and regular rhythm.     Pulses: Normal pulses.  Pulmonary:     Effort: Pulmonary effort is normal.     Breath sounds: Normal breath sounds.  Musculoskeletal:        General: Normal range of motion.     Right knee: Swelling present. Tenderness present.     Comments: Right knee TTP, edema, pain with ROM.   Neurological:     Mental Status: He is alert.      Results for orders placed or performed in visit on 11/07/22  POCT INR  Result Value Ref Range   INR     POC INR 2.7         Assessment & Plan:   Problem List Items Addressed This Visit       Cardiovascular and Mediastinum   Atrial fibrillation ([place]) - Primary   Relevant Orders   POCT INR (Completed)   Other Visit Diagnoses     Chronic pain of right knee            Discussed getting RSV vaccine at pharmacy.  Weight Management Continue Ozempic injections weekly. We will follow up in 1 month.  Chronic Right Knee Pain Continue symptom management with Tramadol. Discussed potentially  seeing Dr. Darene Lamer again. Not interested in going back to see orthopedic surgeon. He is interested in repeating bone scan once he has lost more weight and easier for him to lay down for a period of time.  Atrial Fibrillation on Coumadin INR controlled 2.7 today. We will recheck in 1 month.    Return in about 1 month (around 12/08/2022) for weight management  and INR.  Dorian Heckle, Student-PA

## 2022-11-07 NOTE — Progress Notes (Signed)
See anticoagulation flowsheet.  No changes today.

## 2022-11-07 NOTE — Progress Notes (Signed)
   I interview and exam the patient and agree with the A & P below.  See anticoagulation flowsheet.  No changes today. Open 1 month for INR and for new start Ozempic.

## 2022-11-09 ENCOUNTER — Ambulatory Visit: Payer: Medicare HMO

## 2022-11-10 ENCOUNTER — Other Ambulatory Visit: Payer: Self-pay | Admitting: Family Medicine

## 2022-11-21 ENCOUNTER — Ambulatory Visit (HOSPITAL_COMMUNITY): Payer: Medicare HMO | Attending: Nurse Practitioner

## 2022-11-21 DIAGNOSIS — I7789 Other specified disorders of arteries and arterioles: Secondary | ICD-10-CM | POA: Insufficient documentation

## 2022-11-21 DIAGNOSIS — R0602 Shortness of breath: Secondary | ICD-10-CM | POA: Insufficient documentation

## 2022-11-21 LAB — ECHOCARDIOGRAM COMPLETE
Area-P 1/2: 4.6 cm2
S' Lateral: 3.1 cm

## 2022-11-21 MED ORDER — PERFLUTREN LIPID MICROSPHERE
1.0000 mL | INTRAVENOUS | Status: AC | PRN
  Administered 2022-11-21: 2 mL via INTRAVENOUS

## 2022-11-27 ENCOUNTER — Telehealth: Payer: Self-pay | Admitting: Family Medicine

## 2022-11-27 MED ORDER — TRAMADOL HCL 50 MG PO TABS: 50.0000 mg | ORAL_TABLET | Freq: Three times a day (TID) | ORAL | 0 refills | Status: DC | PRN

## 2022-11-27 NOTE — Telephone Encounter (Signed)
Last fill 10/24/22 last visit 11/07/22  Pended prescription.

## 2022-11-27 NOTE — Telephone Encounter (Signed)
Pt called requesting a refill on Tramadol 50 mg tablets. [facility] on [address] in [place] is the correct pharmacy.

## 2022-11-27 NOTE — Telephone Encounter (Signed)
Meds ordered this encounter  Medications   traMADol (ULTRAM) 50 MG tablet    Sig: Take 1 tablet (50 mg total) by mouth every 8 (eight) hours as needed.    Dispense:  90 tablet    Refill:  0

## 2022-11-29 ENCOUNTER — Telehealth: Payer: Self-pay

## 2022-11-29 NOTE — Telephone Encounter (Signed)
Spoke with pt. Pt was notified of results and recommendations. Pt will continue his current medication and f/u as planned.

## 2022-12-06 ENCOUNTER — Encounter: Payer: Self-pay | Admitting: Family Medicine

## 2022-12-06 ENCOUNTER — Ambulatory Visit (INDEPENDENT_AMBULATORY_CARE_PROVIDER_SITE_OTHER): Payer: Medicare HMO | Admitting: Family Medicine

## 2022-12-06 VITALS — BP 121/62 | HR 51 | Ht 74.0 in | Wt 303.0 lb

## 2022-12-06 DIAGNOSIS — Z6838 Body mass index (BMI) 38.0-38.9, adult: Secondary | ICD-10-CM | POA: Diagnosis not present

## 2022-12-06 DIAGNOSIS — Z7689 Persons encountering health services in other specified circumstances: Secondary | ICD-10-CM | POA: Diagnosis not present

## 2022-12-06 DIAGNOSIS — N1831 Chronic kidney disease, stage 3a: Secondary | ICD-10-CM

## 2022-12-06 DIAGNOSIS — I4891 Unspecified atrial fibrillation: Secondary | ICD-10-CM

## 2022-12-06 DIAGNOSIS — I10 Essential (primary) hypertension: Secondary | ICD-10-CM | POA: Diagnosis not present

## 2022-12-06 LAB — POCT INR: INR: 2.2 (ref 2.0–3.0)

## 2022-12-06 NOTE — Assessment & Plan Note (Signed)
Visit #: 2 Starting [id number]  Current weight: 311 Previous weight:303 lb  Change in weight: down 8 lbs  Goal weight: 200 lbs  Dietary goals: Continue to work on portion control and eating vegetables first and then protein and finally carbs. Exercise goals: Stay active and keep moving. Medication: Ozempic, increase to 0.5 mg weekly.  New prescription sent to pharmacy. Follow-up and referrals: 8 Weeks.

## 2022-12-06 NOTE — Progress Notes (Signed)
Established Patient Office Visit  Subjective   Patient ID: Jerome Hicks, male    DOB: [birth year]  Age: 72 y.o. MRN: [id number]  Chief Complaint  Patient presents with   Weight Check   Coagulation Disorder    HPI Follow-up for weight management-he is doing well with Ozempic still on 0.25 mg.  On his home scale he has lost about 18 pounds.  He says that he has not had any nausea or side effects or concerns he has been very impressed with how easily he gets full normally he will sometimes go back for seconds or thirds for meals but with this medication he is just eating what is on his plate.  He has several complicating factors including atrial fibrillation, chronic venous stasis, chronic osteoarthritis of both knees etc.     ROS    Objective:     BP 121/62 (BP Location: Right Arm, Cuff Size: Large)   Pulse (!) 51   Ht '6\' 2"'$  (1.88 m)   Wt (!) 303 lb (137.4 kg)   SpO2 94%   BMI 38.90 kg/m    Physical Exam Constitutional:      Appearance: He is well-developed.  HENT:     Head: Normocephalic and atraumatic.  Cardiovascular:     Rate and Rhythm: Normal rate and regular rhythm.     Heart sounds: Normal heart sounds.  Pulmonary:     Effort: Pulmonary effort is normal.     Breath sounds: Normal breath sounds.  Skin:    General: Skin is warm and dry.  Neurological:     Mental Status: He is alert and oriented to person, place, and time.  Psychiatric:        Behavior: Behavior normal.    Wt Readings from Last 3 Encounters:  12/06/22 (!) 303 lb (137.4 kg)  11/07/22 (!) 311 lb 0.6 oz (141.1 kg)  10/31/22 (!) 310 lb 6.4 oz (140.8 kg)     Results for orders placed or performed in visit on 12/06/22  POCT INR  Result Value Ref Range   INR 2.2 2.0 - 3.0   POC INR        The ASCVD Risk score (Arnett DK, et al., 2019) failed to calculate for the following reasons:   The valid total cholesterol range is 130 to 320 mg/dL    Assessment & Plan:   Problem List Items  Addressed This Visit       Cardiovascular and Mediastinum   Essential hypertension, benign    Pressure looks phenomenal today.  My hope is that he is he continues to lose weight will actually be able to discontinue his amlodipine.  He is already on a low-dose.      Atrial fibrillation ([place]) - Primary   Relevant Orders   POCT INR (Completed)     Genitourinary   CKD (chronic kidney disease) stage 3, GFR 30-59 ml/min (HCC)    Need to follow renal function every 6 months.        Other   Encounter for weight management    Visit #: 2 Starting [id number]  Current weight: 311 Previous weight:303 lb  Change in weight: down 8 lbs  Goal weight: 200 lbs  Dietary goals: Continue to work on portion control and eating vegetables first and then protein and finally carbs. Exercise goals: Stay active and keep moving. Medication: Ozempic, increase to 0.5 mg weekly.  New prescription sent to pharmacy. Follow-up and referrals: 8 Weeks.  BMI 38.0-38.9,adult    Return in about 2 months (around 02/04/2023) for weight management.    Beatrice Lecher, MD

## 2022-12-06 NOTE — Assessment & Plan Note (Signed)
Need to follow renal function every 6 months. 

## 2022-12-06 NOTE — Progress Notes (Signed)
Pt doing well with weight loss. He will need a refill of Ozempic he took his last dose on Monday.    Pt here for INR check no missed doses, medication changes,ABX use,bruising, abnormal bleeding, blood in urine or stool, CP,SOB, hospitalization, or dental procedures.  Pt did report that he ate Collard greens on Monday but hasn't had any since.

## 2022-12-06 NOTE — Patient Instructions (Signed)
1 month for INR check with nurse

## 2022-12-06 NOTE — Assessment & Plan Note (Signed)
Pressure looks phenomenal today.  My hope is that he is he continues to lose weight will actually be able to discontinue his amlodipine.  He is already on a low-dose.

## 2022-12-07 ENCOUNTER — Other Ambulatory Visit: Payer: Self-pay | Admitting: *Deleted

## 2022-12-07 ENCOUNTER — Other Ambulatory Visit: Payer: Self-pay | Admitting: Family Medicine

## 2022-12-07 ENCOUNTER — Telehealth: Payer: Self-pay | Admitting: Family Medicine

## 2022-12-07 DIAGNOSIS — I4891 Unspecified atrial fibrillation: Secondary | ICD-10-CM

## 2022-12-07 DIAGNOSIS — I10 Essential (primary) hypertension: Secondary | ICD-10-CM

## 2022-12-07 DIAGNOSIS — Z6841 Body Mass Index (BMI) 40.0 and over, adult: Secondary | ICD-10-CM

## 2022-12-07 DIAGNOSIS — N1831 Chronic kidney disease, stage 3a: Secondary | ICD-10-CM

## 2022-12-07 NOTE — Telephone Encounter (Signed)
Medication sent to pharmacy  

## 2022-12-07 NOTE — Telephone Encounter (Signed)
Patient called stated he is out of his ozempic and would like it called in to his pharmacy on file he was seen on 12-06-22.

## 2022-12-08 ENCOUNTER — Other Ambulatory Visit: Payer: Self-pay | Admitting: Family Medicine

## 2022-12-08 DIAGNOSIS — I10 Essential (primary) hypertension: Secondary | ICD-10-CM

## 2022-12-31 ENCOUNTER — Telehealth: Payer: Self-pay | Admitting: Family Medicine

## 2022-12-31 NOTE — Telephone Encounter (Addendum)
Pt called.  He is requesting a refill on his Tramadol(pharmacy on file verified).

## 2023-01-01 MED ORDER — TRAMADOL HCL 50 MG PO TABS: 50.0000 mg | ORAL_TABLET | Freq: Three times a day (TID) | ORAL | 0 refills | Status: DC | PRN

## 2023-01-01 NOTE — Telephone Encounter (Signed)
Meds ordered this encounter  Medications   traMADol (ULTRAM) 50 MG tablet    Sig: Take 1 tablet (50 mg total) by mouth every 8 (eight) hours as needed.    Dispense:  90 tablet    Refill:  0

## 2023-01-02 ENCOUNTER — Other Ambulatory Visit: Payer: Self-pay | Admitting: Family Medicine

## 2023-01-02 DIAGNOSIS — I482 Chronic atrial fibrillation, unspecified: Secondary | ICD-10-CM

## 2023-01-03 ENCOUNTER — Other Ambulatory Visit: Payer: Self-pay | Admitting: Family Medicine

## 2023-01-03 DIAGNOSIS — I10 Essential (primary) hypertension: Secondary | ICD-10-CM

## 2023-01-03 DIAGNOSIS — Z6841 Body Mass Index (BMI) 40.0 and over, adult: Secondary | ICD-10-CM

## 2023-01-03 DIAGNOSIS — I4891 Unspecified atrial fibrillation: Secondary | ICD-10-CM

## 2023-01-03 DIAGNOSIS — N1831 Chronic kidney disease, stage 3a: Secondary | ICD-10-CM

## 2023-01-07 ENCOUNTER — Ambulatory Visit: Payer: Medicare HMO

## 2023-01-07 NOTE — Progress Notes (Deleted)
Cardiology Office Note   Date:  01/07/2023   ID:  Seldon, Barrell 1951-09-03, MRN [id number]  PCP:  Hali Marry, MD  Cardiologist:   Minus Breeding, MD   No chief complaint on file.   History of Present Illness: JAYSEN WEY is a 72 y.o. male who presents for evaluation of SOB.  He is referred by Hali Marry, MD. he has atrial fibrillation although he did not really recall this.  His wife remembers being told this.  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  He tolerates anticoagulation.  He has chronic lower extremity swelling.  I do see an echo in 2022 demonstrated an EF of 55%.  He had aortic dilatation of 4.2 cm.  He has not had any other cardiac work-up.  He has not had cardiac catheterization.  He does not recall stress testing.  He has asbestosis and chronic dyspnea though he is not on oxygen.     At the last visit PET scan was ordered.  ***  *** He battles lower extremity swelling.  This is been worse since he has gained about 40 pounds.  He gained weight because he been eating more sugar after he stopped using tobacco his wife says about a year ago.  He does not describe PND or orthopnea though he can only sleep on his right side he can sleep flat.  He has had increased lower extremity swelling and takes Lasix most days but not always.  He tries to watch his salt.  He does drink a fair amount of fluid.  He has chronic shortness of breath walking just 50 yards on level ground.  He is not describing chest pressure, neck or arm discomfort.  He has a very strong family history for early coronary artery disease.   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)    get yearly PFT's   Ascending aortic aneurysm ([place])    seen on 01-31-18 ct chest ; patient denies awareness   Atrial fibrillation ([place])    Cataracts, bilateral    ED (erectile dysfunction)    has been prescribed Levitra and Viagrain in the  past   Hypertension    Internal hemorrhoids    Kidney stones    Leg edema    recurrent cellulitis   Scrotal abscess 3-03   Shoulder arthritis    Sigmoid diverticulosis     Past Surgical History:  Procedure Laterality Date    MRSA infection in left groin requiring surgical debridement  12-08   CHOLECYSTECTOMY  1971   kidney stones  1990's   Ureteroscopic stone extraction   TONSILLECTOMY     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   Procedure: RIGHT TOTAL KNEE ARTHROPLASTY;  Surgeon: Dorna Leitz, MD;  Location: [facility];  Service: Orthopedics;  Laterality: Right;     Current Outpatient Medications  Medication Sig Dispense Refill   amLODipine (NORVASC) 2.5 MG tablet TAKE 1 TABLET EVERY DAY 90 tablet 3   carvedilol (COREG) 25 MG tablet Take 1 tablet (25 mg total) by mouth 2 (two) times daily with a meal. 180 tablet 3   clotrimazole-betamethasone (LOTRISONE) cream Apply 1 application topically daily as needed (rash). Dont use for more than 2 weeks at a time 45 g 1   doxazosin (CARDURA) 4 MG tablet TAKE 1 TABLET EVERY DAY 90 tablet 10   furosemide (LASIX) 40 MG  tablet Take 2 tablets (80 mg total) by mouth daily. TAKE 1 TO 2 TABLETS DAILY AS NEEDED FOR EDEMA. 180 tablet 1   meclizine (ANTIVERT) 25 MG tablet TAKE 1 TABLET THREE TIMES DAILY AS NEEDED FOR DIZZINESS OR NAUSEA AS DIRECTED 90 tablet 3   Multiple Vitamin (MULTIVITAMIN WITH MINERALS) TABS tablet Take 1 tablet by mouth daily.     OZEMPIC, 0.25 OR 0.5 MG/DOSE, 2 MG/3ML SOPN INJECT 0.5 MG INTO THE SKIN  ONCE A WEEK 3 mL 0   potassium chloride (KLOR-CON M) 10 MEQ tablet TAKE 1 TABLET EVERY DAY 90 tablet 1   traMADol (ULTRAM) 50 MG tablet Take 1 tablet (50 mg total) by mouth every 8 (eight) hours as needed. 90 tablet 0   valsartan (DIOVAN) 320 MG tablet TAKE 1 TABLET EVERY DAY 90 tablet 1   warfarin (COUMADIN) 5 MG tablet TAKE '5MG'$  EVERY MONDAY AND THURSDAY. TAKE 2.'5MG'$  ALL OTHER DAYS 60 tablet 3   No current facility-administered  medications for this visit.    Allergies:   Penicillins    ROS:  Please see the history of present illness.   Otherwise, review of systems are positive for ***.   All other systems are reviewed and negative.    PHYSICAL EXAM: VS:  There were no vitals taken for this visit. , BMI There is no height or weight on file to calculate BMI. GENERAL:  Well appearing NECK:  No jugular venous distention, waveform within normal limits, carotid upstroke brisk and symmetric, no bruits, no thyromegaly LUNGS:  Clear to auscultation bilaterally CHEST:  Unremarkable HEART:  PMI not displaced or sustained,S1 and S2 within normal limits, no S3, no S4, no clicks, no rubs, *** murmurs ABD:  Flat, positive bowel sounds normal in frequency in pitch, no bruits, no rebound, no guarding, no midline pulsatile mass, no hepatomegaly, no splenomegaly EXT:  2 plus pulses throughout, no edema, no cyanosis no clubbing    ***GENERAL:  Well appearing HEENT:  Pupils equal round and reactive, fundi not visualized, oral mucosa unremarkable NECK:  No jugular venous distention, waveform within normal limits, carotid upstroke brisk and symmetric, no bruits, no thyromegaly LYMPHATICS:  No cervical, inguinal adenopathy LUNGS:  Clear to auscultation bilaterally BACK:  No CVA tenderness CHEST:  Unremarkable HEART:  PMI not displaced or sustained,S1 and S2 within normal limits, no S3, no clicks, no rubs, no murmurs, irregular ABD:  Flat, positive bowel sounds normal in frequency in pitch, no bruits, no rebound, no guarding, no midline pulsatile mass, no hepatomegaly, no splenomegaly EXT:  2 plus pulses throughout, severe lower extremity edema, no cyanosis no clubbing, chronic venous stasis changes SKIN:  No rashes no nodules NEURO:  Cranial nerves II through XII grossly intact, motor grossly intact throughout PSYCH:  Cognitively intact, oriented to person place and time    EKG:  EKG is *** ordered today. The ekg ordered  today demonstrates atrial fibrillation with slow ventricular rate, rate ***, low voltage in the limb and chest leads,   Recent Labs: 06/06/2022: ALT 18; Hemoglobin 13.1; Platelets 167 10/31/2022: BNP 135.0; BUN 18; Creatinine, Ser 1.07; Potassium 4.4; Sodium 143    Lipid Panel    Component Value Date/Time   CHOL 124 06/06/2022 0949   TRIG 78 06/06/2022 0949   HDL 42 06/06/2022 0949   CHOLHDL 3.0 06/06/2022 0949   VLDL 9 07/01/2015 1429   LDLCALC 66 06/06/2022 0949      Wt Readings from Last 3 Encounters:  12/06/22 (!) 303  lb (137.4 kg)  11/07/22 (!) 311 lb 0.6 oz (141.1 kg)  10/31/22 (!) 310 lb 6.4 oz (140.8 kg)      Other studies Reviewed: Additional studies/ records that were reviewed today include: ***. Review of the above records demonstrates:  Please see elsewhere in the note.     ASSESSMENT AND PLAN:  SOB:   ***  This may be primarily pulmonary and related to asbestosis.  However, with his significant cardiovascular risk factors I do want exclude ischemia.  I like to try to schedule him for a PET  Atrial fib: ***  It looks like this is a chronic rhythm.  The rate is controlled.  He is already on anticoagulation.  His CHA2DS2-VASc score looks to be about 2.  HTN: Blood pressures is *** controlled.  He will continue the meds as listed.  Aortic root enlargement:  ***  I  will follow this up with repeat CT scanning in the future.  Morbid obesity:   ***  We began to talk about weight loss goals and strategies.  Ultimately he might be a GLP-1 receptor agonist candidate and I will consider this at my follow-up.  Edema:   ***    I like him to take his Lasix 80 mg for the next 3 days and then 40 mg every day.  In a week and going to check a BNP, basic metabolic profile.  TSH was okay.   Current medicines are reviewed at length with the patient today.  The patient does not have concerns regarding medicines.  The following changes have been made: ***  Labs/ tests ordered  today include: ***  No orders of the defined types were placed in this encounter.   Disposition:   FU with ***   Signed, Minus Breeding, MD  01/07/2023 8:26 PM    [place]

## 2023-01-08 ENCOUNTER — Ambulatory Visit: Payer: Medicare HMO | Admitting: Family Medicine

## 2023-01-09 ENCOUNTER — Other Ambulatory Visit: Payer: Self-pay | Admitting: Family Medicine

## 2023-01-09 ENCOUNTER — Ambulatory Visit: Payer: Medicare HMO

## 2023-01-09 ENCOUNTER — Ambulatory Visit: Payer: Medicare HMO | Admitting: Cardiology

## 2023-01-09 DIAGNOSIS — I10 Essential (primary) hypertension: Secondary | ICD-10-CM

## 2023-01-09 DIAGNOSIS — R0602 Shortness of breath: Secondary | ICD-10-CM

## 2023-01-09 DIAGNOSIS — I4821 Permanent atrial fibrillation: Secondary | ICD-10-CM

## 2023-01-16 ENCOUNTER — Telehealth: Payer: Self-pay | Admitting: *Deleted

## 2023-01-16 ENCOUNTER — Ambulatory Visit (INDEPENDENT_AMBULATORY_CARE_PROVIDER_SITE_OTHER): Payer: Medicare HMO | Admitting: Family Medicine

## 2023-01-16 VITALS — BP 118/70 | HR 71 | Ht 74.0 in | Wt 301.0 lb

## 2023-01-16 DIAGNOSIS — I4891 Unspecified atrial fibrillation: Secondary | ICD-10-CM

## 2023-01-16 DIAGNOSIS — I77810 Thoracic aortic ectasia: Secondary | ICD-10-CM

## 2023-01-16 LAB — PROTIME-INR
INR: 6.2 — AB (ref 0.80–1.20)
INR: 5.3 — ABNORMAL HIGH
Prothrombin Time: 50.7 s — ABNORMAL HIGH (ref 9.0–11.5)

## 2023-01-16 NOTE — Progress Notes (Signed)
INR elevated > 6. Will check with venipuncture. No change to meds, diet, etc. Recheck and will call with results.

## 2023-01-16 NOTE — Progress Notes (Signed)
Please call pt: skip tonights coumadin. Tomorrow start 2.52m daily. I think normally takes 547monce a week but he will not do that. Will only take 2.34m96mtarting tomorrow.

## 2023-01-16 NOTE — Progress Notes (Signed)
   Subjective:    Patient ID: Jerome Hicks, male    DOB: [birth year], 72 y.o.   MRN: [id number]  HPI  Pt here for INR, pt states he has not had any missed doses or extra doses, changes to diet, abnormal bleeding or dental procedures  Review of Systems     Objective:   Physical Exam        Assessment & Plan:  Pts INR today was 6.2, pt sent to lab for STAT INR, pt advised to hold coumadin medication until we receive the lab results and contact him with further instructions.

## 2023-01-16 NOTE — Progress Notes (Deleted)
g

## 2023-01-16 NOTE — Telephone Encounter (Signed)
Please call pt. He will need a NV for INR check for Monday 2/19. Thanks.

## 2023-01-21 ENCOUNTER — Ambulatory Visit: Payer: Self-pay | Admitting: Family Medicine

## 2023-01-21 ENCOUNTER — Ambulatory Visit (INDEPENDENT_AMBULATORY_CARE_PROVIDER_SITE_OTHER): Payer: Medicare HMO | Admitting: Family Medicine

## 2023-01-21 VITALS — BP 113/61 | HR 73 | Ht 74.0 in | Wt 303.0 lb

## 2023-01-21 DIAGNOSIS — I4821 Permanent atrial fibrillation: Secondary | ICD-10-CM

## 2023-01-21 DIAGNOSIS — I77810 Thoracic aortic ectasia: Secondary | ICD-10-CM | POA: Diagnosis not present

## 2023-01-21 LAB — POCT INR: POC INR: 2.8

## 2023-01-21 NOTE — Progress Notes (Signed)
See flow sheet

## 2023-01-21 NOTE — Progress Notes (Signed)
   Subjective:    Patient ID: Jerome Hicks, male    DOB: 1951-02-25, 72 y.o.   MRN: [id number]  HPI Pt here for INR, pt denies any abnormal bleeding, missed doses of coumadin, or dental procedures. Pt states he has been experiencing some dizziness for about one week, he believes its coming from his blood pressure medication.     Review of Systems     Objective:   Physical Exam        Assessment & Plan:  Pts INR today was 2.8, pt advised to continue current coumadin plan, no changes and return in 2 weeks for an INR check. Pt also advised to reduce bp medication doxazosin to 53m and we will check bp at next visit as well.

## 2023-01-21 NOTE — Progress Notes (Signed)
See anticoagulation for sleep or adjustments.  Continue current regimen of 2.5 mg daily. Repeat  2 weeks.

## 2023-01-29 ENCOUNTER — Other Ambulatory Visit: Payer: Self-pay | Admitting: Family Medicine

## 2023-01-29 DIAGNOSIS — N1831 Chronic kidney disease, stage 3a: Secondary | ICD-10-CM

## 2023-01-29 DIAGNOSIS — I4891 Unspecified atrial fibrillation: Secondary | ICD-10-CM

## 2023-01-29 DIAGNOSIS — I10 Essential (primary) hypertension: Secondary | ICD-10-CM

## 2023-01-29 DIAGNOSIS — Z6841 Body Mass Index (BMI) 40.0 and over, adult: Secondary | ICD-10-CM

## 2023-02-04 ENCOUNTER — Other Ambulatory Visit: Payer: Self-pay | Admitting: Family Medicine

## 2023-02-04 ENCOUNTER — Ambulatory Visit: Payer: Medicare HMO

## 2023-02-04 MED ORDER — TRAMADOL HCL 50 MG PO TABS: 50.0000 mg | ORAL_TABLET | Freq: Three times a day (TID) | ORAL | 1 refills | Status: DC | PRN

## 2023-02-04 NOTE — Telephone Encounter (Signed)
Pt states he is almost out of his Tramadol and needs this called in to [facility] on [address] in [place] . Please let pt know

## 2023-02-05 NOTE — Telephone Encounter (Signed)
Task completed. Tramadol rx sent to [facility] on 02/04/23.

## 2023-02-05 NOTE — Telephone Encounter (Signed)
Left pt a VM to inform him.

## 2023-02-06 ENCOUNTER — Encounter: Payer: Self-pay | Admitting: Family Medicine

## 2023-02-06 ENCOUNTER — Ambulatory Visit (INDEPENDENT_AMBULATORY_CARE_PROVIDER_SITE_OTHER): Payer: Medicare HMO | Admitting: Family Medicine

## 2023-02-06 VITALS — BP 114/56 | HR 68 | Ht 74.0 in | Wt 297.0 lb

## 2023-02-06 DIAGNOSIS — I4821 Permanent atrial fibrillation: Secondary | ICD-10-CM

## 2023-02-06 DIAGNOSIS — R1013 Epigastric pain: Secondary | ICD-10-CM | POA: Diagnosis not present

## 2023-02-06 DIAGNOSIS — Z7689 Persons encountering health services in other specified circumstances: Secondary | ICD-10-CM | POA: Diagnosis not present

## 2023-02-06 DIAGNOSIS — K5903 Drug induced constipation: Secondary | ICD-10-CM

## 2023-02-06 LAB — POCT INR: INR: 2.3 (ref 2.0–3.0)

## 2023-02-06 MED ORDER — OZEMPIC (1 MG/DOSE) 4 MG/3ML ~~LOC~~ SOPN: 1.0000 mg | PEN_INJECTOR | SUBCUTANEOUS | 1 refills | Status: DC

## 2023-02-06 NOTE — Assessment & Plan Note (Addendum)
Since he is having some side effects including constipation and epigastric fullness.  We discussed spacing out the amount of food and eating a little bit less that with the meals also recommend using a softener or even a laxative for the short-term to try to get things moved out and see if he feels better I would like to check liver enzymes and lipase today just to make sure that it is not causing any side effects.  Will call with results once available.  Visit #: 3 Starting Weight:311   Current weight: 297 Previous weight:303 lb  Change in weight: down 6 lbs  Goal weight: 200 lbs  Dietary goals: Continue to work on portion control and eating vegetables first and then protein and finally carbs. Exercise goals: Stay active and keep moving. Medication: Ozempic, increase to 1 mg weekly.  New prescription sent to pharmacy for end of the month. . Follow-up and referrals: 8 Weeks.

## 2023-02-06 NOTE — Progress Notes (Signed)
Established Patient Office Visit  Subjective   Patient ID: Jerome Hicks, male    DOB: 1951-04-24  Age: 72 y.o. MRN: [id number]  Chief Complaint  Patient presents with   Follow-up    HPI  Atrial fibrillation-here for Coumadin check.  He really is not sure what caused a significant bump up in his INR recently.  He denies any new medication changes he did not started any supplements etc.  He had not been sick or on antibiotics.  But it seems to have regulated again.  He is here today to follow-up for weight management-he is doing well with the Ozempic but has noticed a couple of things he is having a lot of epigastric pressure and fullness.  He says even when he does not eat he notices it.  He says it does feel little bit better after a bowel movement.  He is also going less frequently to the bathroom he says it is now 2 to 3 days between bowel movements.  No nausea vomiting.  No GERD or [place] belching.  He says he is definitely cut back on the amount of food that he is eating.  Says he did notice that his dizziness has been much better since we cut back on his blood pressure pill.    ROS    Objective:     BP (!) 114/56 (BP Location: Left Arm, Patient Position: Sitting, Cuff Size: Large)   Pulse 68   Ht '6\' 2"'$  (1.88 m)   Wt 297 lb (134.7 kg)   SpO2 97%   BMI 38.13 kg/m    Physical Exam Constitutional:      Appearance: He is well-developed.  HENT:     Head: Normocephalic and atraumatic.  Cardiovascular:     Rate and Rhythm: Normal rate and regular rhythm.     Heart sounds: Normal heart sounds.  Pulmonary:     Effort: Pulmonary effort is normal.     Breath sounds: Normal breath sounds.  Skin:    General: Skin is warm and dry.  Neurological:     Mental Status: He is alert and oriented to person, place, and time.  Psychiatric:        Behavior: Behavior normal.      Results for orders placed or performed in visit on 02/06/23  POCT INR  Result Value Ref Range    INR 2.3 2.0 - 3.0   POC INR        The ASCVD Risk score (Arnett DK, et al., 2019) failed to calculate for the following reasons:   The valid total cholesterol range is 130 to 320 mg/dL    Assessment & Plan:   Problem List Items Addressed This Visit       Cardiovascular and Mediastinum   Atrial fibrillation ([place]) - Primary   Relevant Orders   POCT INR (Completed)   CBC with Differential/Platelet   COMPLETE METABOLIC PANEL WITH GFR   Lipase     Other   Encounter for weight management    Since he is having some side effects including constipation and epigastric fullness.  We discussed spacing out the amount of food and eating a little bit less that with the meals also recommend using a softener or even a laxative for the short-term to try to get things moved out and see if he feels better I would like to check liver enzymes and lipase today just to make sure that it is not causing any side effects.  Will  call with results once available.  Visit #: 3 Starting Weight:311   Current weight: 297 Previous weight:303 lb  Change in weight: down 6 lbs  Goal weight: 200 lbs  Dietary goals: Continue to work on portion control and eating vegetables first and then protein and finally carbs. Exercise goals: Stay active and keep moving. Medication: Ozempic, increase to 1 mg weekly.  New prescription sent to pharmacy for end of the month. . Follow-up and referrals: 8 Weeks.      Other Visit Diagnoses     Epigastric pain       Relevant Orders   CBC with Differential/Platelet   COMPLETE METABOLIC PANEL WITH GFR   Lipase   Drug-induced constipation           Constipation - Try to take a laxative for few days to try to get your bowels cleaned out and see if you feel better.  Return in about 6 weeks (around 03/20/2023) for weight check .    Beatrice Lecher, MD

## 2023-02-06 NOTE — Patient Instructions (Signed)
Try to take a laxative for few days to try to get your bowels cleaned out and see if you feel better.

## 2023-02-07 LAB — COMPLETE METABOLIC PANEL WITH GFR
AG Ratio: 1.4 (calc) (ref 1.0–2.5)
ALT: 16 U/L (ref 9–46)
AST: 16 U/L (ref 10–35)
Albumin: 3.6 g/dL (ref 3.6–5.1)
Alkaline phosphatase (APISO): 69 U/L (ref 35–144)
BUN: 14 mg/dL (ref 7–25)
CO2: 30 mmol/L (ref 20–32)
Calcium: 9.1 mg/dL (ref 8.6–10.3)
Chloride: 109 mmol/L (ref 98–110)
Creat: 0.94 mg/dL (ref 0.70–1.28)
Globulin: 2.6 g/dL (calc) (ref 1.9–3.7)
Glucose, Bld: 107 mg/dL — ABNORMAL HIGH (ref 65–99)
Potassium: 4.9 mmol/L (ref 3.5–5.3)
Sodium: 144 mmol/L (ref 135–146)
Total Bilirubin: 2.2 mg/dL — ABNORMAL HIGH (ref 0.2–1.2)
Total Protein: 6.2 g/dL (ref 6.1–8.1)
eGFR: 86 mL/min/{1.73_m2} (ref >=60)

## 2023-02-07 LAB — CBC WITH DIFFERENTIAL/PLATELET
Absolute Monocytes: 656 cells/uL (ref 200–950)
Basophils Absolute: 47 cells/uL (ref 0–200)
Basophils Relative: 0.6 %
Eosinophils Absolute: 190 cells/uL (ref 15–500)
Eosinophils Relative: 2.4 %
HCT: 38.4 % — ABNORMAL LOW (ref 38.5–50.0)
Hemoglobin: 12.8 g/dL — ABNORMAL LOW (ref 13.2–17.1)
Lymphs Abs: 1959 cells/uL (ref 850–3900)
MCH: 31.8 pg (ref 27.0–33.0)
MCHC: 33.3 g/dL (ref 32.0–36.0)
MCV: 95.5 fL (ref 80.0–100.0)
MPV: 10.6 fL (ref 7.5–12.5)
Monocytes Relative: 8.3 %
Neutro Abs: 5048 cells/uL (ref 1500–7800)
Neutrophils Relative %: 63.9 %
Platelets: 222 10*3/uL (ref 140–400)
RBC: 4.02 10*6/uL — ABNORMAL LOW (ref 4.20–5.80)
RDW: 12.3 % (ref 11.0–15.0)
Total Lymphocyte: 24.8 %
WBC: 7.9 10*3/uL (ref 3.8–10.8)

## 2023-02-07 LAB — LIPASE: Lipase: 22 U/L (ref 7–60)

## 2023-02-07 NOTE — Progress Notes (Signed)
Cal pt: labs are ok except he is mildly anemia.  Has he noticed any blood in the urine or stool?  I still want him to try the laxative for couple of days to try to get cleaned out and see if his pain is better.  Also want him to come by and pick up some stool cards to do once he gets cleaned out just to check for blood in the stool that might not be visible.  Also please encourage him to schedule Medicare wellness exam.

## 2023-02-10 ENCOUNTER — Other Ambulatory Visit: Payer: Self-pay | Admitting: Family Medicine

## 2023-02-11 ENCOUNTER — Other Ambulatory Visit: Payer: Self-pay | Admitting: *Deleted

## 2023-02-11 DIAGNOSIS — Z1211 Encounter for screening for malignant neoplasm of colon: Secondary | ICD-10-CM

## 2023-02-14 LAB — POC HEMOCCULT BLD/STL (HOME/3-CARD/SCREEN)
Card #2 Fecal Occult Blod, POC: NEGATIVE
Card #3 Date: 3132024
Card #3 Fecal Occult Blood, POC: NEGATIVE
Fecal Occult Blood, POC: NEGATIVE

## 2023-02-22 ENCOUNTER — Ambulatory Visit (INDEPENDENT_AMBULATORY_CARE_PROVIDER_SITE_OTHER): Payer: Medicare HMO | Admitting: Family Medicine

## 2023-02-22 DIAGNOSIS — Z0000 Encounter for general adult medical examination without abnormal findings: Secondary | ICD-10-CM

## 2023-02-22 NOTE — Progress Notes (Signed)
MEDICARE ANNUAL WELLNESS VISIT  02/22/2023  Telephone Visit Disclaimer This Medicare AWV was conducted by telephone due to national recommendations for restrictions regarding the COVID-19 Pandemic (e.g. social distancing).  I verified, using two identifiers, that I am speaking with Jerome Hicks or their authorized healthcare agent. I discussed the limitations, risks, security, and privacy concerns of performing an evaluation and management service by telephone and the potential availability of an in-person appointment in the future. The patient expressed understanding and agreed to proceed.  Location of Patient: Home Location of Provider (nurse):  In the office   Subjective:    Jerome Hicks is a 72 y.o. male patient of Metheney, Jerome Kocher, MD who had a Medicare Annual Wellness Visit today via telephone. [name] is Retired and lives with their spouse. he has 2 children. he reports that he is socially active and does interact with friends/family regularly. he is minimally physically active and enjoys being outside and working with wood.  Patient Care Team: Jerome Marry, MD as PCP - General Jerome Breeding, MD as PCP - Cardiology (Cardiology) Jerome Hicks, [facility] as Pharmacist (Pharmacist)     02/22/2023    8:04 AM 09/18/2021    9:03 AM 07/15/2019    8:10 AM 08/20/2018   11:18 AM 08/01/2018    3:00 PM 08/01/2018    2:50 PM 07/25/2018   10:32 AM  Advanced Directives  Does Patient Have a Medical Advance Directive? Yes Yes Yes Yes  Yes Yes  Type of Advance Directive Living will Living will [place];Living will [place];Living will Living will  Living will  Does patient want to make changes to medical advance directive? No - Patient declined No - Patient declined No - Patient declined  No - Patient declined  No - Patient declined  Copy of [place] in Chart?   No - copy requested        Hospital Utilization Over the  Past 12 Months: # of hospitalizations or ER visits: 0 # of surgeries: 0  Review of Systems    Patient reports that his overall health is unchanged compared to last year.  History obtained from chart review and the patient  Patient Reported Readings (BP, Pulse, CBG, Weight, etc) none  Pain Assessment Pain : No/denies pain     Current Medications & Allergies (verified) Allergies as of 02/22/2023       Reactions   Penicillins    Childhood allergy Has patient had a PCN reaction causing immediate rash, facial/tongue/throat swelling, SOB or lightheadedness with hypotension: Unknown Has patient had a PCN reaction causing severe rash involving mucus membranes or skin necrosis: Unknown Has patient had a PCN reaction that required hospitalization: Unknown Has patient had a PCN reaction occurring within the last 10 years: No If all of the above answers are "NO", then may proceed with Cephalosporin use.        Medication List        Accurate as of February 22, 2023  8:17 AM. If you have any questions, ask your nurse or doctor.          amLODipine 2.5 MG tablet Commonly known as: NORVASC TAKE 1 TABLET EVERY DAY   carvedilol 25 MG tablet Commonly known as: COREG Take 1 tablet (25 mg total) by mouth 2 (two) times daily with a meal.   clotrimazole-betamethasone cream Commonly known as: LOTRISONE Apply 1 application topically daily as needed (rash). Dont use for  more than 2 weeks at a time   doxazosin 4 MG tablet Commonly known as: CARDURA TAKE 1 TABLET EVERY DAY What changed:  how much to take how to take this when to take this   furosemide 40 MG tablet Commonly known as: LASIX Take 2 tablets (80 mg total) by mouth daily. TAKE 1 TO 2 TABLETS DAILY AS NEEDED FOR EDEMA.   meclizine 25 MG tablet Commonly known as: ANTIVERT TAKE 1 TABLET THREE TIMES DAILY AS NEEDED FOR DIZZINESS OR NAUSEA AS DIRECTED   multivitamin with minerals Tabs tablet Take 1 tablet by mouth  daily.   Ozempic (1 MG/DOSE) 4 MG/3ML Sopn Generic drug: Semaglutide (1 MG/DOSE) Inject 1 mg into the skin every 7 (seven) days.   potassium chloride 10 MEQ tablet Commonly known as: KLOR-CON M TAKE 1 TABLET EVERY DAY   traMADol 50 MG tablet Commonly known as: ULTRAM Take 1 tablet (50 mg total) by mouth every 8 (eight) hours as needed.   valsartan 320 MG tablet Commonly known as: DIOVAN TAKE 1 TABLET EVERY DAY   warfarin 5 MG tablet Commonly known as: COUMADIN Take as directed by the anticoagulation clinic. If you are unsure how to take this medication, talk to your nurse or doctor. Original instructions: TAKE 5MG  EVERY MONDAY AND THURSDAY. 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)    get yearly PFT's   Ascending aortic aneurysm ([place])    seen on 01-31-18 ct chest ; patient denies awareness   Atrial fibrillation ([place])    Cataracts, bilateral    ED (erectile dysfunction)    has been prescribed Levitra and Viagrain in the past   Hypertension    Internal hemorrhoids    Kidney stones    Leg edema    recurrent cellulitis   Scrotal abscess 3-03   Shoulder arthritis    Sigmoid diverticulosis    Past Surgical History:  Procedure Laterality Date    MRSA infection in left groin requiring surgical debridement  12-08   CHOLECYSTECTOMY  1971   kidney stones  1990's   Ureteroscopic stone extraction   TONSILLECTOMY     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   Procedure: RIGHT TOTAL KNEE ARTHROPLASTY;  Surgeon: Dorna Leitz, MD;  Location: [facility];  Service: Orthopedics;  Laterality: Right;   Family History  Problem Relation Age of Onset   Heart attack Mother 26   Heart attack Father 13   Heart attack Sister 33   Breast cancer Sister    Heart attack Brother 72   Multiple sclerosis Daughter    Social History   Socioeconomic History   Marital status: Married    Spouse name: Jerome Hicks    Number of children: 2   Years of education: 14   Highest education level: Associate degree: occupational, Hotel manager, or vocational program  Occupational History   Occupation: On disability     Comment: retired  Tobacco Use   Smoking status: Never   Smokeless tobacco: Former    Types: Chew    Quit date: 05/21/2021  Vaping Use   Vaping Use: Never used  Substance and Sexual Activity   Alcohol use: No   Drug use: No   Sexual activity: Not Currently  Other Topics Concern   Not on file  Social History Narrative   Lives with his wife. He has two children. He enjoys being outside  and working with wood.    Social Determinants of Health   Financial Resource Strain: Low Risk  (02/22/2023)   Overall Financial Resource Strain (CARDIA)    Difficulty of Paying Living Expenses: Not hard at all  Food Insecurity: No Food Insecurity (02/22/2023)   Hunger Vital Sign    Worried About Running Out of Food in the Last Year: Never true    Ran Out of Food in the Last Year: Never true  Transportation Needs: No Transportation Needs (02/22/2023)   PRAPARE - Hydrologist (Medical): No    Lack of Transportation (Non-Medical): No  Physical Activity: Inactive (02/22/2023)   Exercise Vital Sign    Days of Exercise per Week: 0 days    Minutes of Exercise per Session: 0 min  Stress: No Stress Concern Present (02/22/2023)   [place]    Feeling of Stress : Not at all  Social Connections: Moderately Integrated (02/22/2023)   Social Connection and Isolation Panel [NHANES]    Frequency of Communication with Friends and Family: More than three times a week    Frequency of Social Gatherings with Friends and Family: Three times a week    Attends Religious Services: More than 4 times per year    Active Member of Clubs or Organizations: No    Attends Archivist Meetings: Never    Marital Status: Married     Activities of Daily Living    02/22/2023    8:07 AM  In your present state of health, do you have any difficulty performing the following activities:  Hearing? 1  Comment bilateral hearing loss  Vision? 0  Difficulty concentrating or making decisions? 0  Walking or climbing stairs? 0  Dressing or bathing? 0  Doing errands, shopping? 0  Preparing Food and eating ? N  Using the Toilet? N  In the past six months, have you accidently leaked urine? N  Do you have problems with loss of bowel control? N  Managing your Medications? N  Managing your Finances? N  Housekeeping or managing your Housekeeping? N    Patient Education/ Literacy How often do you need to have someone help you when you read instructions, pamphlets, or other written materials from your doctor or pharmacy?: 1 - Never What is the last grade level you completed in school?: 3 years of college  Exercise Current Exercise Habits: The patient does not participate in regular exercise at present, Exercise limited by: None identified  Diet Patient reports consuming 3 meals a day and 1 snack(s) a day Patient reports that his primary diet is: Regular Patient reports that she does have regular access to food.   Depression Screen    02/22/2023    8:04 AM 02/06/2023    8:47 AM 12/06/2022    8:11 AM 12/05/2021   10:23 AM 09/18/2021    9:05 AM 06/13/2021    9:00 AM 06/23/2020    9:45 AM  PHQ 2/9 Scores  PHQ - 2 Score 0 0 0 0 0 0 0     Fall Risk    02/22/2023    8:04 AM 02/06/2023    8:47 AM 12/06/2022    8:11 AM 06/06/2022    8:27 AM 12/25/2021    8:00 AM  [place] in the past year? 0 0 0 0 0  Number falls in past yr: 0 0 0 0 0  Injury with Fall? 0 0  0 0 0  Risk for fall due to : No Fall Risks No Fall Risks No Fall Risks No Fall Risks No Fall Risks  Follow up Falls evaluation completed Falls evaluation completed Falls evaluation completed Falls prevention discussed;Falls evaluation completed Falls prevention  discussed;Falls evaluation completed     Objective:  Jerome Hicks seemed alert and oriented and he participated appropriately during our telephone visit.  Blood Pressure Weight BMI  BP Readings from Last 3 Encounters:  02/06/23 (!) 114/56  01/21/23 113/61  01/16/23 118/70   Wt Readings from Last 3 Encounters:  02/06/23 297 lb (134.7 kg)  01/21/23 (!) 303 lb (137.4 kg)  01/16/23 (!) 301 lb (136.5 kg)   BMI Readings from Last 1 Encounters:  02/06/23 38.13 kg/m    *Unable to obtain current vital signs, weight, and BMI due to telephone visit type  Hearing/Vision  [place] did not seem to have difficulty with hearing/understanding during the telephone conversation Reports that he has not had a formal eye exam by an eye care professional within the past year Reports that he has had a formal hearing evaluation within the past year *Unable to fully assess hearing and vision during telephone visit type  Cognitive Function:    02/22/2023    8:09 AM 09/18/2021    9:10 AM 06/23/2020    9:44 AM 07/15/2019    8:16 AM 06/08/2019    8:30 AM  6CIT Screen  What Year? 0 points 0 points 0 points 0 points 0 points  What month? 0 points 0 points 0 points 0 points 0 points  What time? 0 points 0 points 0 points 0 points 0 points  Count back from 20 0 points 0 points 0 points 0 points 0 points  Months in reverse 0 points 0 points 0 points 0 points 0 points  Repeat phrase 0 points 0 points 0 points 0 points 4 points  Total Score 0 points 0 points 0 points 0 points 4 points   (Normal:0-7, Significant for Dysfunction: >8)  Normal Cognitive Function Screening: Yes   Immunization & Health Maintenance Record Immunization History  Administered Date(s) Administered   COVID-19, mRNA, vaccine(Comirnaty)12 years and older 09/20/2022   Fluad Quad(high Dose 65+) 08/24/2020, 09/08/2021, 08/13/2022   Influenza Split 08/21/2012   Influenza Whole 08/27/2008, 11/07/2009, 10/18/2010, 08/20/2011   Influenza,  High Dose Seasonal PF 08/26/2017, 08/08/2018, 08/03/2019   Influenza,inj,Quad PF,6+ Mos 08/25/2013, 08/05/2014, 08/31/2015, 08/28/2016   PFIZER(Purple Top)SARS-COV-2 Vaccination 02/12/2020, 03/05/2020   Pneumococcal Conjugate-13 09/29/2013   Pneumococcal Polysaccharide-23 07/14/2015   Rsv, Bivalent, Protein Subunit Rsvpref,pf (Abrysvo) 12/10/2022   Td 12/03/1994, 06/15/2009   Zoster Recombinat (Shingrix) 06/07/2018, 08/21/2018   Zoster, Live 06/18/2011    Health Maintenance  Topic Date Due   COLONOSCOPY (Pts 45-79yrs Insurance coverage will need to be confirmed)  05/09/2023 (Originally 07/13/2022)   COLON CANCER SCREENING ANNUAL FOBT  02/14/2024   Medicare Annual Wellness (AWV)  02/22/2024   Pneumonia Vaccine 20+ Years old  Completed   INFLUENZA VACCINE  Completed   COVID-19 Vaccine  Completed   Hepatitis C Screening  Completed   Zoster Vaccines- Shingrix  Completed   HPV VACCINES  Aged Out   DTaP/Tdap/Td  Discontinued       Assessment  This is a routine wellness examination for [employer].  Health Maintenance: Due or Overdue There are no preventive care reminders to display for this patient.   Jerome Hicks does not need a referral for Community Assistance: Care Management:  no Social Work:    no Prescription Assistance:  no Nutrition/Diabetes Education:  no   Plan:  Personalized Goals  Goals Addressed               This Visit's Progress     Patient Stated (pt-stated)        Patient stated that he would like to loose 100 lbs.       Personalized Health Maintenance & Screening Recommendations  There are no preventive care reminders to display for this patient.  Lung Cancer Screening Recommended: no (Low Dose CT Chest recommended if Age 1-80 years, 30 pack-year currently smoking OR have quit w/in past 15 years) Hepatitis C Screening recommended: no HIV Screening recommended: no  Advanced Directives: Written information was not prepared per  patient's request.  Referrals & Orders No orders of the defined types were placed in this encounter.   Follow-up Plan Follow-up with Jerome Marry, MD as planned Medicare wellness visit in one year.  AVS printed and mailed to the patient.   I have personally reviewed and noted the following in the patient's chart:   Medical and social history Use of alcohol, tobacco or illicit drugs  Current medications and supplements Functional ability and status Nutritional status Physical activity Advanced directives List of other physicians Hospitalizations, surgeries, and ER visits in previous 12 months Vitals Screenings to include cognitive, depression, and falls Referrals and appointments  In addition, I have reviewed and discussed with Jerome Hicks certain preventive protocols, quality metrics, and best practice recommendations. A written personalized care plan for preventive services as well as general preventive health recommendations is available and can be mailed to the patient at his request.      Tinnie Gens, RN BSN  02/22/2023

## 2023-02-22 NOTE — Patient Instructions (Addendum)
[place] Maintenance Summary and Written Plan of Care  Mr. Jerome Hicks ,  Thank you for allowing me to perform your Medicare Annual Wellness Visit and for your ongoing commitment to your health.   Health Maintenance & Immunization History Health Maintenance  Topic Date Due   COLONOSCOPY (Pts 45-83yrs Insurance coverage will need to be confirmed)  05/09/2023 (Originally 07/13/2022)   [place] FOBT  02/14/2024   Medicare Annual Wellness (AWV)  02/22/2024   Pneumonia Vaccine 65+ Years old  Completed   INFLUENZA VACCINE  Completed   COVID-19 Vaccine  Completed   Hepatitis C Screening  Completed   Zoster Vaccines- Shingrix  Completed   HPV VACCINES  Aged Out   DTaP/Tdap/Td  Discontinued   Immunization History  Administered Date(s) Administered   COVID-19, mRNA, vaccine(Comirnaty)12 years and older 09/20/2022   Fluad Quad(high Dose 65+) 08/24/2020, 09/08/2021, 08/13/2022   Influenza Split 08/21/2012   Influenza Whole 08/27/2008, 11/07/2009, 10/18/2010, 08/20/2011   Influenza, High Dose Seasonal PF 08/26/2017, 08/08/2018, 08/03/2019   Influenza,inj,Quad PF,6+ Mos 08/25/2013, 08/05/2014, 08/31/2015, 08/28/2016   PFIZER(Purple Top)SARS-COV-2 Vaccination 02/12/2020, 03/05/2020   Pneumococcal Conjugate-13 09/29/2013   Pneumococcal Polysaccharide-23 07/14/2015   Rsv, Bivalent, Protein Subunit Rsvpref,pf [place]) 12/10/2022   Td 12/03/1994, 06/15/2009   Zoster Recombinat (Shingrix) 06/07/2018, 08/21/2018   Zoster, Live 06/18/2011    These are the patient goals that we discussed:  Goals Addressed               This Visit's Progress     Patient Stated (pt-stated)        Patient stated that he would like to loose 100 lbs.         This is a list of Health Maintenance Items that are overdue or due now: There are no preventive care reminders to display for this patient.   Orders/Referrals Placed Today: No orders of the defined  types were placed in this encounter.  (Contact our referral department at [phone] if you have not spoken with someone about your referral appointment within the next 5 days)    Follow-up Plan Follow-up with Hali Marry, MD as planned Medicare wellness visit in one year.  AVS printed and mailed to the patient.     Health Maintenance, Male Adopting a healthy lifestyle and getting preventive care are important in promoting health and wellness. Ask your health care provider about: The right schedule for you to have regular tests and exams. Things you can do on your own to prevent diseases and keep yourself healthy. What should I know about diet, weight, and exercise? Eat a healthy diet  Eat a diet that includes plenty of vegetables, fruits, low-fat dairy products, and lean protein. Do not eat a lot of foods that are high in solid fats, added sugars, or sodium. Maintain a healthy weight Body mass index (BMI) is a measurement that can be used to identify possible weight problems. It estimates body fat based on height and weight. Your health care provider can help determine your BMI and help you achieve or maintain a healthy weight. Get regular exercise Get regular exercise. This is one of the most important things you can do for your health. Most adults should: Exercise for at least 150 minutes each week. The exercise should increase your heart rate and make you sweat (moderate-intensity exercise). Do strengthening exercises at least twice a week. This is in addition to the moderate-intensity exercise. Spend less time sitting. Even light physical activity  can be beneficial. Watch cholesterol and blood lipids Have your blood tested for lipids and cholesterol at 73 years of age, then have this test every 5 years. You may need to have your cholesterol levels checked more often if: Your lipid or cholesterol levels are high. You are older than 73 years of age. You are at high  risk for heart disease. What should I know about cancer screening? Many types of cancers can be detected early and may often be prevented. Depending on your health history and family history, you may need to have cancer screening at various ages. This may include screening for: Colorectal cancer. Prostate cancer. Skin cancer. Lung cancer. What should I know about heart disease, diabetes, and high blood pressure? Blood pressure and heart disease High blood pressure causes heart disease and increases the risk of stroke. This is more likely to develop in people who have high blood pressure readings or are overweight. Talk with your health care provider about your target blood pressure readings. Have your blood pressure checked: Every 3-5 years if you are 55-80 years of age. Every year if you are 69 years old or older. If you are between the ages of 40 and 97 and are a current or former smoker, ask your health care provider if you should have a one-time screening for abdominal aortic aneurysm (AAA). Diabetes Have regular diabetes screenings. This checks your fasting blood sugar level. Have the screening done: Once every three years after age 49 if you are at a normal weight and have a low risk for diabetes. More often and at a younger age if you are overweight or have a high risk for diabetes. What should I know about preventing infection? Hepatitis B If you have a higher risk for hepatitis B, you should be screened for this virus. Talk with your health care provider to find out if you are at risk for hepatitis B infection. Hepatitis C Blood testing is recommended for: Everyone born from [birth year] through 1966. Anyone with known risk factors for hepatitis C. Sexually transmitted infections (STIs) You should be screened each year for STIs, including gonorrhea and chlamydia, if: You are sexually active and are younger than 73 years of age. You are older than 73 years of age and your health care  provider tells you that you are at risk for this type of infection. Your sexual activity has changed since you were last screened, and you are at increased risk for chlamydia or gonorrhea. Ask your health care provider if you are at risk. Ask your health care provider about whether you are at high risk for HIV. Your health care provider may recommend a prescription medicine to help prevent HIV infection. If you choose to take medicine to prevent HIV, you should first get tested for HIV. You should then be tested every 3 months for as long as you are taking the medicine. Follow these instructions at home: Alcohol use Do not drink alcohol if your health care provider tells you not to drink. If you drink alcohol: Limit how much you have to 0-2 drinks a day. Know how much alcohol is in your drink. In the [place] one drink equals one 12 oz bottle of beer (355 mL), one 5 oz glass of wine (148 mL), or one 1 oz glass of hard liquor (44 mL). Lifestyle Do not use any products that contain nicotine or tobacco. These products include cigarettes, chewing tobacco, and vaping devices, such as e-cigarettes. If you need help quitting,  ask your health care provider. Do not use street drugs. Do not share needles. Ask your health care provider for help if you need support or information about quitting drugs. General instructions Schedule regular health, dental, and eye exams. Stay current with your vaccines. Tell your health care provider if: You often feel depressed. You have ever been abused or do not feel safe at home. Summary Adopting a healthy lifestyle and getting preventive care are important in promoting health and wellness. Follow your health care provider's instructions about healthy diet, exercising, and getting tested or screened for diseases. Follow your health care provider's instructions on monitoring your cholesterol and blood pressure. This information is not intended to replace advice given to  you by your health care provider. Make sure you discuss any questions you have with your health care provider. Document Revised: 04/10/2021 Document Reviewed: 04/10/2021 Elsevier Patient Education  [place].

## 2023-02-26 ENCOUNTER — Other Ambulatory Visit: Payer: Self-pay | Admitting: Family Medicine

## 2023-02-26 DIAGNOSIS — Z6841 Body Mass Index (BMI) 40.0 and over, adult: Secondary | ICD-10-CM

## 2023-02-26 DIAGNOSIS — I10 Essential (primary) hypertension: Secondary | ICD-10-CM

## 2023-02-26 DIAGNOSIS — N1831 Chronic kidney disease, stage 3a: Secondary | ICD-10-CM

## 2023-02-26 DIAGNOSIS — I4891 Unspecified atrial fibrillation: Secondary | ICD-10-CM

## 2023-03-04 ENCOUNTER — Other Ambulatory Visit: Payer: Self-pay | Admitting: Family Medicine

## 2023-03-04 DIAGNOSIS — I10 Essential (primary) hypertension: Secondary | ICD-10-CM

## 2023-03-06 ENCOUNTER — Ambulatory Visit (INDEPENDENT_AMBULATORY_CARE_PROVIDER_SITE_OTHER): Payer: Medicare HMO | Admitting: Family Medicine

## 2023-03-06 VITALS — BP 121/60 | HR 77 | Ht 74.0 in | Wt 298.0 lb

## 2023-03-06 DIAGNOSIS — I4821 Permanent atrial fibrillation: Secondary | ICD-10-CM

## 2023-03-06 LAB — POCT INR: INR: 3.3 — AB (ref 2.0–3.0)

## 2023-03-06 NOTE — Progress Notes (Signed)
See changes

## 2023-03-06 NOTE — Patient Instructions (Signed)
Start taking  coumadin as 2.5mg   (0.5mg ) tablet daily and return in 2 weeks for nurse visit for INR check.

## 2023-03-06 NOTE — Addendum Note (Signed)
Addended by: [name] on: 03/06/2023 01:04 PM   Modules accepted: Orders

## 2023-03-13 MED ORDER — TRAMADOL HCL 50 MG PO TABS: 50.0000 mg | ORAL_TABLET | Freq: Three times a day (TID) | ORAL | 1 refills | Status: DC | PRN

## 2023-03-13 NOTE — Addendum Note (Signed)
Addended by: Nani Gasser D on: 03/13/2023 08:00 AM   Modules accepted: Orders

## 2023-03-20 ENCOUNTER — Ambulatory Visit: Payer: Medicare HMO

## 2023-03-20 ENCOUNTER — Ambulatory Visit (INDEPENDENT_AMBULATORY_CARE_PROVIDER_SITE_OTHER): Payer: Medicare HMO | Admitting: Family Medicine

## 2023-03-20 ENCOUNTER — Encounter: Payer: Self-pay | Admitting: Family Medicine

## 2023-03-20 ENCOUNTER — Ambulatory Visit: Payer: Medicare HMO | Admitting: Cardiology

## 2023-03-20 VITALS — BP 127/73 | HR 70 | Ht 74.0 in | Wt 295.8 lb

## 2023-03-20 DIAGNOSIS — R17 Unspecified jaundice: Secondary | ICD-10-CM

## 2023-03-20 DIAGNOSIS — R1013 Epigastric pain: Secondary | ICD-10-CM

## 2023-03-20 DIAGNOSIS — I4821 Permanent atrial fibrillation: Secondary | ICD-10-CM

## 2023-03-20 DIAGNOSIS — E786 Lipoprotein deficiency: Secondary | ICD-10-CM | POA: Diagnosis not present

## 2023-03-20 LAB — POCT INR: INR: 2.3 (ref 2.0–3.0)

## 2023-03-20 NOTE — Progress Notes (Signed)
Established Patient Office Visit  Subjective   Patient ID: Jerome Hicks, male    DOB: [birth year]  Age: 72 y.o. MRN: [id number]  Chief Complaint  Patient presents with   skin coloring seems off    Patient in office today for INR check and noticed skin color seems to be "off"- he states he is feeling well.    anticoagulation f/u     INR today was 2.3- patient reports taking 2.5mg  ( 1/2 tablet  of  daily)=kph    HPI  He is here today with abdominal pain and jaundice.  He came in for his INR today and the staff noted that he looked more yellow.  He says for several weeks has been having more epigastric abdominal pain.  He is also been more constipated he has been try to take a softener.  But he notices if he tries to take a deep breath he will have more epigastric discomfort.  No nausea or vomiting.  No blood in the stool.  No abnormal color of the stools.  He feels like his appetites been really good.  No fevers or chills.  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  HENT:     Head: Normocephalic and atraumatic.  Cardiovascular:     Rate and Rhythm: Normal rate and regular rhythm.     Heart sounds: Normal heart sounds.  Pulmonary:     Effort: Pulmonary effort is normal.     Breath sounds: Normal breath sounds.  Abdominal:     General: Bowel sounds are normal.     Palpations: Abdomen is soft. There is no mass.     Tenderness: There is abdominal tenderness. There is no rebound.  Skin:    General: Skin is warm and dry.     Comments: + jaundice  Neurological:     Mental Status: He is alert and oriented to person, place, and time.  Psychiatric:        Behavior: Behavior normal.     Results for orders placed  or performed in visit on 03/20/23  POCT INR  Result Value Ref Range   INR 2.3 2.0 - 3.0      The ASCVD Risk score (Arnett DK, et al., 2019) failed to calculate for the following reasons:   The valid total cholesterol range is 130 to 320 mg/dL    Assessment & Plan:   Problem List Items Addressed This Visit       Cardiovascular and Mediastinum   Atrial fibrillation - Primary   Relevant Orders   CBC with Differential/Platelet   COMPLETE METABOLIC PANEL WITH GFR   Lipid Panel w/reflex Direct LDL   Lipase   Bilirubin, fractionated (tot/dir/indir)     Other   Gilbert's disease   Relevant Orders   CBC with Differential/Platelet   COMPLETE METABOLIC PANEL WITH GFR   Lipid Panel w/reflex Direct LDL   Lipase   Bilirubin, fractionated (tot/dir/indir)   Other Visit Diagnoses     Jaundice       Relevant Orders   CBC with Differential/Platelet   COMPLETE METABOLIC  PANEL WITH GFR   Lipid Panel w/reflex Direct LDL   Lipase   Bilirubin, fractionated (tot/dir/indir)   Epigastric pain       Relevant Orders   CBC with Differential/Platelet   COMPLETE METABOLIC PANEL WITH GFR   Lipid Panel w/reflex Direct LDL   Lipase   Bilirubin, fractionated (tot/dir/indir)       Jaundice with a history of USAA and recent onset of epigastric abdominal pain-he is on Ozempic so we will check a lipase though this does not typically cause jaundice.  He has had his gallbladder removed back in 1971.  Will check to see if his bilirubin levels are elevated.  There are no recent illnesses that would have caused a spike in his bilirubin.  Will check a CBC with differential.   Constipation-recommend starting MiraLAX since the stool softeners are not helping.  The Ozempic could be contributing to the constipation.  So we may need to get him on a regular regimen.  Return if symptoms worsen or fail to improve.    Nani Gasser, MD

## 2023-03-20 NOTE — Patient Instructions (Signed)
Please pick up a bottle of MiraLAX powder.  Put 1 capful in about 6 ounces of water and drink 1 cup each night until bowel movements are soft.

## 2023-03-21 ENCOUNTER — Other Ambulatory Visit: Payer: Self-pay

## 2023-03-21 DIAGNOSIS — R17 Unspecified jaundice: Secondary | ICD-10-CM

## 2023-03-21 DIAGNOSIS — R710 Precipitous drop in hematocrit: Secondary | ICD-10-CM

## 2023-03-21 LAB — CBC WITH DIFFERENTIAL/PLATELET
Absolute Monocytes: 676 cells/uL (ref 200–950)
Basophils Absolute: 68 cells/uL (ref 0–200)
Basophils Relative: 0.9 %
Eosinophils Absolute: 190 cells/uL (ref 15–500)
Eosinophils Relative: 2.5 %
HCT: 37.3 % — ABNORMAL LOW (ref 38.5–50.0)
Hemoglobin: 12.4 g/dL — ABNORMAL LOW (ref 13.2–17.1)
Lymphs Abs: 2432 cells/uL (ref 850–3900)
MCH: 31.5 pg (ref 27.0–33.0)
MCHC: 33.2 g/dL (ref 32.0–36.0)
MCV: 94.7 fL (ref 80.0–100.0)
MPV: 10.6 fL (ref 7.5–12.5)
Monocytes Relative: 8.9 %
Neutro Abs: 4233 cells/uL (ref 1500–7800)
Neutrophils Relative %: 55.7 %
Platelets: 208 10*3/uL (ref 140–400)
RBC: 3.94 10*6/uL — ABNORMAL LOW (ref 4.20–5.80)
RDW: 11.8 % (ref 11.0–15.0)
Total Lymphocyte: 32 %
WBC: 7.6 10*3/uL (ref 3.8–10.8)

## 2023-03-21 LAB — LIPID PANEL W/REFLEX DIRECT LDL
Cholesterol: 121 mg/dL (ref <200)
HDL: 37 mg/dL — ABNORMAL LOW (ref >=40)
LDL Cholesterol (Calc): 65 mg/dL (calc)
Non-HDL Cholesterol (Calc): 84 mg/dL (calc) (ref <130)
Total CHOL/HDL Ratio: 3.3 (calc) (ref <5.0)
Triglycerides: 100 mg/dL (ref <150)

## 2023-03-21 LAB — COMPLETE METABOLIC PANEL WITH GFR
AG Ratio: 1.6 (calc) (ref 1.0–2.5)
ALT: 14 U/L (ref 9–46)
AST: 19 U/L (ref 10–35)
Albumin: 3.8 g/dL (ref 3.6–5.1)
Alkaline phosphatase (APISO): 63 U/L (ref 35–144)
BUN: 17 mg/dL (ref 7–25)
CO2: 30 mmol/L (ref 20–32)
Calcium: 9.2 mg/dL (ref 8.6–10.3)
Chloride: 103 mmol/L (ref 98–110)
Creat: 0.94 mg/dL (ref 0.70–1.28)
Globulin: 2.4 g/dL (calc) (ref 1.9–3.7)
Glucose, Bld: 112 mg/dL — ABNORMAL HIGH (ref 65–99)
Potassium: 4 mmol/L (ref 3.5–5.3)
Sodium: 142 mmol/L (ref 135–146)
Total Bilirubin: 2.8 mg/dL — ABNORMAL HIGH (ref 0.2–1.2)
Total Protein: 6.2 g/dL (ref 6.1–8.1)
eGFR: 86 mL/min/{1.73_m2} (ref >=60)

## 2023-03-21 LAB — LIPASE: Lipase: 21 U/L (ref 7–60)

## 2023-03-21 LAB — BILIRUBIN, FRACTIONATED(TOT/DIR/INDIR)
Bilirubin, Direct: 0.5 mg/dL — ABNORMAL HIGH (ref 0.0–0.2)
Indirect Bilirubin: 2.3 mg/dL (calc) — ABNORMAL HIGH (ref 0.2–1.2)
Total Bilirubin: 2.8 mg/dL — ABNORMAL HIGH (ref 0.2–1.2)

## 2023-03-21 NOTE — Progress Notes (Signed)
Patient: Hemoglobin stable he has been between 12 and 13 over the last 9 months.  It does seem to be drifting down just slightly.  So I really like to get you in with GI make sure that you are not losing any blood through your gut.  Your liver function is normal.  Your bilirubin is up just slightly.  It was 2.2 last time this time it is 2.8.  So would like to recheck it in about 2 weeks.  If at any point you start feeling like you are having any abdominal pain or nausea then please let me know or if your bowels or is not improving.  LDL cholesterol looks good.  No sign of pancreatitis.  Patient is okay with GI referral then please place.  Not sure where he went before.

## 2023-03-21 NOTE — Progress Notes (Signed)
K for 3 weeks.  And okay to place GI referral for low hemoglobin.

## 2023-03-24 ENCOUNTER — Other Ambulatory Visit: Payer: Self-pay | Admitting: Family Medicine

## 2023-03-24 DIAGNOSIS — I10 Essential (primary) hypertension: Secondary | ICD-10-CM

## 2023-04-05 ENCOUNTER — Telehealth: Payer: Self-pay | Admitting: Family Medicine

## 2023-04-05 NOTE — Telephone Encounter (Signed)
Patient called requesting a refill of traMADol (ULTRAM) 50 MG tablet [[id number]]   Pharmacy [facility] [address] [place] - [address]

## 2023-04-05 NOTE — Telephone Encounter (Signed)
Pt will need to contact the pharmacy this was sent on 03/13/2023 and has 1 refill. Please see below:   traMADol (ULTRAM) 50 MG tablet Take 1 tablet (50 mg total) by mouth every 8 (eight) hours as needed. Dispense: 90 tablet, Refills: 1 ordered  03/13/2023 -- Agapito Games, MD          Summary: Take 1 tablet (50 mg total) by mouth every 8 (eight) hours as needed., Starting Wed 03/13/2023, Normal Dose, Route, Frequency: 50 mg, Oral, Every 8 hours PRNDx Associated: End Date Warning: Start Date & Time: 04/10/2024Ord/Sold: 03/13/2023 (O)Ordered On: 04/10/2024ReportPharmacy: [id number] - [place], [place] - [address]       PRN: PRN

## 2023-04-08 ENCOUNTER — Ambulatory Visit (INDEPENDENT_AMBULATORY_CARE_PROVIDER_SITE_OTHER): Payer: Medicare HMO | Admitting: Family Medicine

## 2023-04-08 VITALS — BP 126/69 | HR 77

## 2023-04-08 DIAGNOSIS — I4891 Unspecified atrial fibrillation: Secondary | ICD-10-CM | POA: Diagnosis not present

## 2023-04-08 DIAGNOSIS — R17 Unspecified jaundice: Secondary | ICD-10-CM | POA: Diagnosis not present

## 2023-04-08 LAB — POCT INR: INR: 1.9 — AB (ref 2.0–3.0)

## 2023-04-08 NOTE — Progress Notes (Signed)
INR order line low today.  Recheck in 1 week.

## 2023-04-09 LAB — BILIRUBIN, FRACTIONATED(TOT/DIR/INDIR)
Bilirubin, Direct: 0.5 mg/dL — ABNORMAL HIGH (ref 0.0–0.2)
Indirect Bilirubin: 1.8 mg/dL (calc) — ABNORMAL HIGH (ref 0.2–1.2)
Total Bilirubin: 2.3 mg/dL — ABNORMAL HIGH (ref 0.2–1.2)

## 2023-04-09 NOTE — Progress Notes (Signed)
Patient scheduled.

## 2023-04-09 NOTE — Progress Notes (Signed)
Hi Flay, bilirubin is still elevated but looks a little bit better and it is down to 2.3.  I saw where the GI tried to contact him to get him scheduled.  Did he call them back?

## 2023-04-10 ENCOUNTER — Telehealth: Payer: Self-pay | Admitting: Gastroenterology

## 2023-04-10 NOTE — Telephone Encounter (Signed)
He is due for surveillance colonoscopy, history of multiple adenomatous colon polyps.  Please schedule next available appointment for direct colonoscopy if he meets criteria for LEC and does not have any significant active GI issues..  Thank you

## 2023-04-10 NOTE — Telephone Encounter (Signed)
Good morning Dr. Lavon Paganini,   Supervising Provider AM 04/10/2023  We received a referral for decreased hemoglobin. Patient has history with [facility] and his last colonoscopy was in 2020. He states that he is requesting to transfer his care over to [place] per his primary care provider recommendations and never hearing anything from [facility] after his colonoscopy in 2020. His records are in [facility] for you to review and advise on scheduling.   Thank you.

## 2023-04-11 ENCOUNTER — Encounter: Payer: Self-pay | Admitting: Gastroenterology

## 2023-04-12 ENCOUNTER — Telehealth: Payer: Self-pay | Admitting: Family Medicine

## 2023-04-15 ENCOUNTER — Ambulatory Visit (INDEPENDENT_AMBULATORY_CARE_PROVIDER_SITE_OTHER): Payer: Medicare HMO | Admitting: Family Medicine

## 2023-04-15 VITALS — BP 126/64 | HR 80

## 2023-04-15 DIAGNOSIS — S80811A Abrasion, right lower leg, initial encounter: Secondary | ICD-10-CM | POA: Diagnosis not present

## 2023-04-15 DIAGNOSIS — I4891 Unspecified atrial fibrillation: Secondary | ICD-10-CM

## 2023-04-15 LAB — POCT INR: INR: 2.4 (ref 2.0–3.0)

## 2023-04-15 NOTE — Progress Notes (Signed)
   Acute Office Visit  Subjective:     Patient ID: Jerome Hicks, male    DOB: 1951/08/09, 72 y.o.   MRN: [id number]  No chief complaint on file.   HPI Patient is in today for INR check.  He is doing well and has no concerns in that regard and his INR is at goal.  But he did want me to look at his leg today.  He unfortunately was scratching at it and then it started bleeding he has a history of chronic venous stasis and we have had in the past use Unna boots on him when he starts to get too much edema.  He wanted me to take a look at it today.  ROS      Objective:    BP 126/64   Pulse 80   SpO2 97%    Physical Exam  Results for orders placed or performed in visit on 04/15/23  POCT INR  Result Value Ref Range   INR 2.4 2.0 - 3.0        Assessment & Plan:   Problem List Items Addressed This Visit       Cardiovascular and Mediastinum   Atrial fibrillation (HCC) - Primary   Other Visit Diagnoses     Abrasion of anterior right lower leg, initial encounter           Abrasion of leg from scratching-for now I do not see any signs of infection or cellulitis.  Apply Vaseline and moisturize twice a day.  Avoid scratching and irritating the area also encouraged him to put an Ace wrap on for compression he is not able to wear compression stockings.  If at any point it is not healing or the wound starts to drain encouraged him to reach back out as soon as possible.   A-fib-see anticoagulation flowsheet for adjustments.  Check INR in 2 weeks.  No orders of the defined types were placed in this encounter.   No follow-ups on file.  Nani Gasser, MD

## 2023-04-15 NOTE — Telephone Encounter (Signed)
error 

## 2023-05-02 ENCOUNTER — Other Ambulatory Visit: Payer: Self-pay | Admitting: Physician Assistant

## 2023-05-02 DIAGNOSIS — R079 Chest pain, unspecified: Secondary | ICD-10-CM

## 2023-05-09 ENCOUNTER — Encounter: Payer: Self-pay | Admitting: Family Medicine

## 2023-05-09 ENCOUNTER — Ambulatory Visit (INDEPENDENT_AMBULATORY_CARE_PROVIDER_SITE_OTHER): Payer: Medicare HMO | Admitting: Family Medicine

## 2023-05-09 ENCOUNTER — Telehealth: Payer: Self-pay | Admitting: Family Medicine

## 2023-05-09 VITALS — BP 129/71 | HR 74 | Ht 74.0 in | Wt 291.0 lb

## 2023-05-09 DIAGNOSIS — N1831 Chronic kidney disease, stage 3a: Secondary | ICD-10-CM

## 2023-05-09 DIAGNOSIS — I83029 Varicose veins of left lower extremity with ulcer of unspecified site: Secondary | ICD-10-CM

## 2023-05-09 DIAGNOSIS — I7121 Aneurysm of the ascending aorta, without rupture: Secondary | ICD-10-CM | POA: Diagnosis not present

## 2023-05-09 DIAGNOSIS — L97919 Non-pressure chronic ulcer of unspecified part of right lower leg with unspecified severity: Secondary | ICD-10-CM

## 2023-05-09 DIAGNOSIS — J61 Pneumoconiosis due to asbestos and other mineral fibers: Secondary | ICD-10-CM

## 2023-05-09 DIAGNOSIS — L97811 Non-pressure chronic ulcer of other part of right lower leg limited to breakdown of skin: Secondary | ICD-10-CM

## 2023-05-09 DIAGNOSIS — I10 Essential (primary) hypertension: Secondary | ICD-10-CM

## 2023-05-09 DIAGNOSIS — I83019 Varicose veins of right lower extremity with ulcer of unspecified site: Secondary | ICD-10-CM

## 2023-05-09 DIAGNOSIS — I872 Venous insufficiency (chronic) (peripheral): Secondary | ICD-10-CM

## 2023-05-09 DIAGNOSIS — L97929 Non-pressure chronic ulcer of unspecified part of left lower leg with unspecified severity: Secondary | ICD-10-CM

## 2023-05-09 DIAGNOSIS — Z96651 Presence of right artificial knee joint: Secondary | ICD-10-CM

## 2023-05-09 DIAGNOSIS — I4891 Unspecified atrial fibrillation: Secondary | ICD-10-CM | POA: Diagnosis not present

## 2023-05-09 LAB — POCT UA - MICROALBUMIN
Creatinine, POC: 200 mg/dL
Microalbumin Ur, POC: 80 mg/L

## 2023-05-09 LAB — POCT INR: INR: 2 (ref 2.0–3.0)

## 2023-05-09 NOTE — Progress Notes (Signed)
Established Patient Office Visit  Subjective   Patient ID: Jerome Hicks, male    DOB: Nov 11, 1951  Age: 72 y.o. MRN: [id number]  Chief Complaint  Patient presents with   Coagulation Disorder   Weight Check    HPI  He can longer afford the ozempic.  He it sounds like he may have bit his donut hole with his insurance.  His current BMI is $300 out-of-pocket.  He is down 5 lbs. he has lost about 30 pounds total and has done really well.  On 2.68m coumadin daily and his INR 2.    CKD - due for urine micro.   He still feels short of breath with activities and with walking.  We had previously referred him to cardiology.  They did an echocardiogram and it looks like they had ordered an additional test that was never performed.  He also does have a history of asbestosis and is due for his repeat spirometry evaluation this year.  Has noticed his shortness of breath with laying down has almost completely resolved since he is lost 30 pounds and is made a big difference in how he feels.    ROS    Objective:     BP 129/71   Pulse 74   Ht 6\' 2"  (1.88 m)   Wt 291 lb (132 kg)   SpO2 97%   BMI 37.36 kg/m    Physical Exam Constitutional:      Appearance: He is well-developed.  HENT:     Head: Normocephalic and atraumatic.  Cardiovascular:     Rate and Rhythm: Normal rate and regular rhythm.     Heart sounds: Normal heart sounds.  Pulmonary:     Effort: Pulmonary effort is normal.     Breath sounds: Normal breath sounds.  Skin:    General: Skin is warm and dry.  Neurological:     Mental Status: He is alert and oriented to person, place, and time.  Psychiatric:        Behavior: Behavior normal.      Results for orders placed or performed in visit on 05/09/23  POCT INR  Result Value Ref Range   INR 2.0 2.0 - 3.0   POC INR    POCT UA - Microalbumin  Result Value Ref Range   Microalbumin Ur, POC 80 mg/L   Creatinine, POC 200 mg/dL   Albumin/Creatinine Ratio, Urine, POC  30-300       The ASCVD Risk score (Arnett DK, et al., 2019) failed to calculate for the following reasons:   The valid total cholesterol range is 130 to 320 mg/dL    Assessment & Plan:   Problem List Items Addressed This Visit       Cardiovascular and Mediastinum   Essential hypertension, benign    At goal.       Relevant Orders   CT CARDIAC SCORING (SELF PAY ONLY)   Atrial fibrillation (HCC) - Primary   Relevant Orders   POCT INR (Completed)   Aneurysm of ascending aorta without rupture (HCC)    Due for repeat eval in 12/24      Relevant Orders   CT CARDIAC SCORING (SELF PAY ONLY)     Respiratory   Asbestosis (HCC)    Plans to have spirometry this summer.         Musculoskeletal and Integument   Venous stasis ulcer of other part of right lower leg limited to breakdown of skin without varicose veins (HCC)  A few small ulcerations on that right lower leg but overall feels like it looks a lot better.  Still some pitting edema as well as some chronic lymphedema.        Genitourinary   CKD (chronic kidney disease) stage 3, GFR 30-59 ml/min (HCC)   Relevant Orders   CT CARDIAC SCORING (SELF PAY ONLY)   POCT UA - Microalbumin (Completed)   Other Visit Diagnoses     Venous stasis ulcers of both lower extremities (HCC)   (Chronic)        His LDL is less than 70 but he does have several risk factors for heart disease so I would like to get a CT cardiac score for further evaluation we will go ahead and place order today.  Echocardiogram in December was reassuring.  And I will have a follow-up this December.  CKD 3-due for urine microalbumin evaluation.  Also reports he is still having a lot of pain in his right knee.  He had met with our sports med doc and they had discussed doing some imaging but at the time he was not able to lay flat because of his shortness of breath.  Now that he is down 30 pounds he feels like he could proceed with the imaging scan.  So I will  send a note to Dr. Benjamin Stain in that regard.  Return in about 2 weeks (around 05/23/2023) for nurse visit for INR.    Nani Gasser, MD

## 2023-05-09 NOTE — Telephone Encounter (Signed)
Pt called: Would like to move forward with further imaging with contrast of his right knee.  He had previously canceled it because he was having shortness of breath with laying flat.  But he is now able to lay flat without difficulty.

## 2023-05-09 NOTE — Assessment & Plan Note (Signed)
A few small ulcerations on that right lower leg but overall feels like it looks a lot better.  Still some pitting edema as well as some chronic lymphedema.

## 2023-05-09 NOTE — Assessment & Plan Note (Signed)
Plans to have spirometry this summer.

## 2023-05-09 NOTE — Assessment & Plan Note (Signed)
Due for repeat eval in 12/24

## 2023-05-09 NOTE — Progress Notes (Signed)
Pt here for INR check no missed doses, diet or medication changes,ABX use,bruising, abnormal bleeding, blood in urine or stool, CP,SOB, hospitalization, or dental procedures.  Pt is taking 2.5 mg daily.   Pt has been cleaning the abrasion to his R lower leg daily and using vaseline and applying band aid

## 2023-05-09 NOTE — Telephone Encounter (Signed)
Roger that, I believe that was the three-phase bone scan to evaluate for arthroplasty aseptic loosening. I will order that again now.

## 2023-05-09 NOTE — Assessment & Plan Note (Signed)
At goal.  

## 2023-05-09 NOTE — Progress Notes (Addendum)
Continue valsartan for renal protection 

## 2023-05-21 ENCOUNTER — Other Ambulatory Visit: Payer: Self-pay | Admitting: Family Medicine

## 2023-05-21 DIAGNOSIS — I10 Essential (primary) hypertension: Secondary | ICD-10-CM

## 2023-05-23 ENCOUNTER — Ambulatory Visit (INDEPENDENT_AMBULATORY_CARE_PROVIDER_SITE_OTHER): Payer: Medicare HMO | Admitting: Sports Medicine

## 2023-05-23 VITALS — BP 130/74 | HR 73 | Ht 74.0 in | Wt 298.0 lb

## 2023-05-23 DIAGNOSIS — I4891 Unspecified atrial fibrillation: Secondary | ICD-10-CM

## 2023-05-23 LAB — POCT INR: POC INR: 1.9

## 2023-05-23 NOTE — Assessment & Plan Note (Signed)
Subtherapeutic at 1.9.  Increase dose, 5 mg on Fridays, 2.5 mg other days. Recheck in 2 weeks.

## 2023-05-23 NOTE — Progress Notes (Signed)
    Procedures performed today:    None.  Independent interpretation of notes and tests performed by another provider:   None.  Brief History, Exam, Impression, and Recommendations:    Atrial fibrillation Subtherapeutic at 1.9.  Increase dose, 5 mg on Fridays, 2.5 mg other days. Recheck in 2 weeks.     ____________________________________________ Ihor Austin. Benjamin Stain, M.D., ABFM., CAQSM., AME. Primary Care and Sports Medicine [facility]  Adjunct Professor of Family Medicine  [employer]  Restaurant manager, fast food

## 2023-05-23 NOTE — Progress Notes (Signed)
Pt here today for INR check no missed doses no dietary changes no bleeding or bruising and no antibiotics  INR is at 1.9 today pt advised to take 5MG  on Fridays and 2.5 on all other days.

## 2023-06-03 ENCOUNTER — Encounter (HOSPITAL_COMMUNITY): Payer: Medicare HMO

## 2023-06-03 ENCOUNTER — Other Ambulatory Visit (HOSPITAL_COMMUNITY): Payer: Medicare HMO

## 2023-06-05 ENCOUNTER — Ambulatory Visit (INDEPENDENT_AMBULATORY_CARE_PROVIDER_SITE_OTHER): Payer: Medicare HMO | Admitting: Family Medicine

## 2023-06-05 ENCOUNTER — Ambulatory Visit (INDEPENDENT_AMBULATORY_CARE_PROVIDER_SITE_OTHER): Payer: Self-pay

## 2023-06-05 ENCOUNTER — Other Ambulatory Visit: Payer: Self-pay

## 2023-06-05 VITALS — BP 120/71 | HR 60 | Ht 74.0 in

## 2023-06-05 DIAGNOSIS — I4821 Permanent atrial fibrillation: Secondary | ICD-10-CM | POA: Diagnosis not present

## 2023-06-05 DIAGNOSIS — I129 Hypertensive chronic kidney disease with stage 1 through stage 4 chronic kidney disease, or unspecified chronic kidney disease: Secondary | ICD-10-CM

## 2023-06-05 DIAGNOSIS — R931 Abnormal findings on diagnostic imaging of heart and coronary circulation: Secondary | ICD-10-CM

## 2023-06-05 DIAGNOSIS — N1831 Chronic kidney disease, stage 3a: Secondary | ICD-10-CM

## 2023-06-05 DIAGNOSIS — I10 Essential (primary) hypertension: Secondary | ICD-10-CM

## 2023-06-05 DIAGNOSIS — I7121 Aneurysm of the ascending aorta, without rupture: Secondary | ICD-10-CM

## 2023-06-05 LAB — POCT INR: INR: 2.6 (ref 2.0–3.0)

## 2023-06-05 MED ORDER — ROSUVASTATIN CALCIUM 10 MG PO TABS: 10.0000 mg | ORAL_TABLET | Freq: Every day | ORAL | 3 refills | Status: DC

## 2023-06-05 MED ORDER — TRAMADOL HCL 50 MG PO TABS: 50.0000 mg | ORAL_TABLET | Freq: Three times a day (TID) | ORAL | 1 refills | Status: DC | PRN

## 2023-06-05 NOTE — Telephone Encounter (Signed)
Requesting rx rf of Tramadol 50mg   Last written 03/13/23 #90 - ref 1 Last visit 05/09/23. Next appt 06/11/23.  Spoke with pharmacist  last filled on 05/09/23 no refills remaining.  Patient also states had CT done this morning.

## 2023-06-05 NOTE — Progress Notes (Signed)
In addition to the note below you are actually due for repeat screening for of the aorta in December of this year.  So we do not have to order it right now but we will get that done later this year.

## 2023-06-05 NOTE — Progress Notes (Addendum)
Hi Jerome Hicks, your calcium scan back high at 727 this puts you in the 75th percentile for age.  It is highly recommended that we start a cholesterol-lowering drug to reduce your risk for heart attack and stroke.  As you now have evidence of plaque in those blood vessels around the heart.  Also noticed a little bit of widening of your aorta.  It looks mild which is reassuring but we need to evaluate it a little further with an echocardiogram or ultrasound.  If you are okay with [place] moving forward with that please let me know.  If it is a little dilated then we do need to keep an eye on that over time.  It can develop into an aneurysm.  I can go ahead and send over the medication to lower your cholesterol and reduce your risk for stroke and heart attack.  In due for repeat evaluation of the aorta in December 2024.

## 2023-06-05 NOTE — Progress Notes (Signed)
   Established Patient Office Visit  Subjective   Patient ID: NISHIL LAZO, male    DOB: Apr 26, 1951  Age: 72 y.o. MRN: [id number]  No chief complaint on file.   HPI    ROS    Objective:     BP 120/71   Pulse 60   Ht 6\' 2"  (1.88 m)   SpO2 98%   BMI 38.26 kg/m    Physical Exam   Results for orders placed or performed in visit on 06/05/23  POCT INR  Result Value Ref Range   INR 2.6 2.0 - 3.0      The ASCVD Risk score (Arnett DK, et al., 2019) failed to calculate for the following reasons:   The valid total cholesterol range is 130 to 320 mg/dL    Assessment & Plan:   Problem List Items Addressed This Visit       Cardiovascular and Mediastinum   Atrial fibrillation (HCC) - Primary    No follow-ups on file.    Elizabeth Palau, LPN

## 2023-06-05 NOTE — Telephone Encounter (Signed)
Patient informed. 

## 2023-06-05 NOTE — Progress Notes (Signed)
Patient therapeutic again.  Continue current regimen.  Recommend repeat 3 weeks.

## 2023-06-11 ENCOUNTER — Telehealth: Payer: Self-pay | Admitting: Family Medicine

## 2023-06-11 ENCOUNTER — Encounter: Payer: Self-pay | Admitting: Family Medicine

## 2023-06-11 ENCOUNTER — Ambulatory Visit (INDEPENDENT_AMBULATORY_CARE_PROVIDER_SITE_OTHER): Payer: Medicare HMO | Admitting: Family Medicine

## 2023-06-11 VITALS — BP 136/80 | HR 69 | Wt 303.0 lb

## 2023-06-11 DIAGNOSIS — R1013 Epigastric pain: Secondary | ICD-10-CM | POA: Diagnosis not present

## 2023-06-11 DIAGNOSIS — L97929 Non-pressure chronic ulcer of unspecified part of left lower leg with unspecified severity: Secondary | ICD-10-CM | POA: Diagnosis not present

## 2023-06-11 DIAGNOSIS — J61 Pneumoconiosis due to asbestos and other mineral fibers: Secondary | ICD-10-CM | POA: Diagnosis not present

## 2023-06-11 DIAGNOSIS — J984 Other disorders of lung: Secondary | ICD-10-CM | POA: Diagnosis not present

## 2023-06-11 DIAGNOSIS — I83019 Varicose veins of right lower extremity with ulcer of unspecified site: Secondary | ICD-10-CM | POA: Diagnosis not present

## 2023-06-11 DIAGNOSIS — Z0000 Encounter for general adult medical examination without abnormal findings: Secondary | ICD-10-CM

## 2023-06-11 DIAGNOSIS — I83029 Varicose veins of left lower extremity with ulcer of unspecified site: Secondary | ICD-10-CM | POA: Diagnosis not present

## 2023-06-11 DIAGNOSIS — L97919 Non-pressure chronic ulcer of unspecified part of right lower leg with unspecified severity: Secondary | ICD-10-CM | POA: Diagnosis not present

## 2023-06-11 DIAGNOSIS — R0602 Shortness of breath: Secondary | ICD-10-CM | POA: Diagnosis not present

## 2023-06-11 LAB — CBC WITH DIFFERENTIAL/PLATELET
Absolute Monocytes: 614 cells/uL (ref 200–950)
Basophils Relative: 0.7 %
HCT: 37.2 % — ABNORMAL LOW (ref 38.5–50.0)
Hemoglobin: 12.3 g/dL — ABNORMAL LOW (ref 13.2–17.1)
MCHC: 33.1 g/dL (ref 32.0–36.0)
MPV: 10.9 fL (ref 7.5–12.5)
Neutro Abs: 4003 cells/uL (ref 1500–7800)
Neutrophils Relative %: 54.1 %
Platelets: 196 10*3/uL (ref 140–400)
Total Lymphocyte: 34.5 %
WBC: 7.4 10*3/uL (ref 3.8–10.8)

## 2023-06-11 MED ORDER — MUPIROCIN 2 % EX OINT: TOPICAL_OINTMENT | Freq: Two times a day (BID) | CUTANEOUS | 1 refills | Status: DC | PRN

## 2023-06-11 NOTE — Telephone Encounter (Signed)
Pls call [facility] and see if he is dur for recall for his colonoscopy. I think he is.

## 2023-06-11 NOTE — Progress Notes (Signed)
Complete physical exam  Patient: Jerome Hicks   DOB: [birth year]   72 y.o. Male  MRN: [id number]  Subjective:    Chief Complaint  Patient presents with   Annual Exam    Jerome Hicks is a 72 y.o. male who presents today for a complete physical exam. He reports consuming a general diet. The patient does not participate in regular exercise at present. He generally feels poorly. He reports sleeping well.   He did want me to look at his legs today he does not feel like they are particularly swollen but he has been scratching at the back of his calves and now he has some open sores and wounds.  He thinks he might need Unna boots again.  Sent of there is an antibacterial ointment that he could use sometimes.  He also feels like there is a tightness in his epigastric area that is keeping him from taking a full breath.  He has a history of restrictive lung disease and asbestosis he is due for his yearly lung check in August.  But has not established with a local pulmonologist.  Is noticed that he is getting more short of breath and winded with activities it has been particularly consistent over the last year.  Recently started a statin after getting his calcium CT score back.  He says he is tolerating it well so far without any side effects.  No myalgias.  Most recent fall risk assessment:    05/09/2023    8:51 AM  Fall Risk   Falls in the past year? 0  Number falls in past yr: 0  Injury with Fall? 0  Risk for fall due to : No Fall Risks  Follow up Falls evaluation completed     Most recent depression screenings:    05/09/2023    8:51 AM 03/20/2023   10:07 AM  PHQ 2/9 Scores  PHQ - 2 Score 0 0        Patient Care Team: Agapito Games, MD as PCP - General (Family Medicine) Rollene Rotunda, MD as PCP - Cardiology (Cardiology) Gabriel Carina, [facility] as Pharmacist (Pharmacist)   Outpatient Medications Prior to Visit  Medication Sig   amLODipine (NORVASC) 2.5 MG tablet  TAKE 1 TABLET EVERY DAY   carvedilol (COREG) 25 MG tablet TAKE 1 TABLET TWICE DAILY WITH MEALS   clotrimazole-betamethasone (LOTRISONE) cream Apply 1 application topically daily as needed (rash). Dont use for more than 2 weeks at a time   doxazosin (CARDURA) 4 MG tablet TAKE 1 TABLET EVERY DAY (Patient taking differently: 2 mg.)   furosemide (LASIX) 40 MG tablet Take 2 tablets (80 mg total) by mouth daily. TAKE 1 TO 2 TABLETS DAILY AS NEEDED FOR EDEMA.   meclizine (ANTIVERT) 25 MG tablet TAKE 1 TABLET THREE TIMES DAILY AS NEEDED FOR DIZZINESS OR NAUSEA AS DIRECTED   Multiple Vitamin (MULTIVITAMIN WITH MINERALS) TABS tablet Take 1 tablet by mouth daily.   potassium chloride (KLOR-CON M) 10 MEQ tablet TAKE 1 TABLET EVERY DAY   rosuvastatin (CRESTOR) 10 MG tablet Take 1 tablet (10 mg total) by mouth at bedtime.   traMADol (ULTRAM) 50 MG tablet Take 1 tablet (50 mg total) by mouth every 8 (eight) hours as needed.   valsartan (DIOVAN) 320 MG tablet TAKE 1 TABLET EVERY DAY   warfarin (COUMADIN) 5 MG tablet TAKE 5MG  EVERY MONDAY AND THURSDAY. TAKE 2.5MG  ALL OTHER DAYS (Patient taking differently: Take 2.5 mg by mouth daily.)  No facility-administered medications prior to visit.    ROS        Objective:     BP 136/80   Pulse 69   Wt (!) 303 lb (137.4 kg)   SpO2 100%   BMI 38.90 kg/m    Physical Exam Constitutional:      Appearance: He is well-developed.  HENT:     Head: Normocephalic and atraumatic.     Right Ear: Tympanic membrane, ear canal and external ear normal.     Left Ear: Tympanic membrane, ear canal and external ear normal.     Nose: Nose normal.     Mouth/Throat:     Pharynx: Oropharynx is clear.  Eyes:     Conjunctiva/sclera: Conjunctivae normal.     Pupils: Pupils are equal, round, and reactive to light.  Neck:     Thyroid: No thyromegaly.  Cardiovascular:     Rate and Rhythm: Normal rate and regular rhythm.     Heart sounds: Normal heart sounds.  Pulmonary:      Effort: Pulmonary effort is normal.     Breath sounds: Normal breath sounds.  Abdominal:     General: Bowel sounds are normal. There is no distension.     Palpations: Abdomen is soft.     Tenderness: There is abdominal tenderness. There is no guarding.     Comments: Epigastric tenderness   Musculoskeletal:     Cervical back: Neck supple.  Lymphadenopathy:     Cervical: No cervical adenopathy.  Skin:    General: Skin is warm and dry.  Neurological:     Mental Status: He is alert and oriented to person, place, and time.  Psychiatric:        Behavior: Behavior normal.      No results found for any visits on 06/11/23.     Assessment & Plan:    Routine Health Maintenance and Physical Exam  Immunization History  Administered Date(s) Administered   COVID-19, mRNA, vaccine(Comirnaty)12 years and older 09/20/2022, 03/06/2023   Fluad Quad(high Dose 65+) 08/24/2020, 09/08/2021, 08/13/2022   Influenza Split 08/21/2012   Influenza Whole 08/27/2008, 11/07/2009, 10/18/2010, 08/20/2011   Influenza, High Dose Seasonal PF 08/26/2017, 08/08/2018, 08/03/2019   Influenza,inj,Quad PF,6+ Mos 08/25/2013, 08/05/2014, 08/31/2015, 08/28/2016   PFIZER(Purple Top)SARS-COV-2 Vaccination 02/12/2020, 03/05/2020   PNEUMOCOCCAL CONJUGATE-20 03/06/2023   Pneumococcal Conjugate-13 09/29/2013   Pneumococcal Polysaccharide-23 07/14/2015   Rsv, Bivalent, Protein Subunit Rsvpref,pf (Abrysvo) 12/10/2022   Td 12/03/1994, 06/15/2009   Zoster Recombinant(Shingrix) 06/07/2018, 08/21/2018   Zoster, Live 06/18/2011    Health Maintenance  Topic Date Due   Colonoscopy  07/13/2022   INFLUENZA VACCINE  07/04/2023   COLON CANCER SCREENING ANNUAL FOBT  02/14/2024   Medicare Annual Wellness (AWV)  02/22/2024   Pneumonia Vaccine 53+ Years old  Completed   COVID-19 Vaccine  Completed   Hepatitis C Screening  Completed   Zoster Vaccines- Shingrix  Completed   HPV VACCINES  Aged Out   DTaP/Tdap/Td  Discontinued     Discussed health benefits of physical activity, and encouraged him to engage in regular exercise appropriate for his age and condition.  Problem List Items Addressed This Visit       Respiratory   Restrictive lung disease   Relevant Orders   Ambulatory referral to Pulmonology   Asbestosis [facility])   Relevant Orders   Ambulatory referral to Pulmonology   Other Visit Diagnoses     Wellness examination    -  Primary   SOB (shortness of breath)  Relevant Orders   Ambulatory referral to Pulmonology   Epigastric pain       Relevant Orders   Lipase   CBC with Differential/Platelet   Venous stasis ulcers of both lower extremities (HCC)           Keep up a regular exercise program and make sure you are eating a healthy diet Try to eat 4 servings of dairy a day, or if you are lactose intolerant take a calcium with vitamin D daily.  Your vaccines are up to date.  Minded him that he is due for his 3-year recall colonoscopy.  Encouraged him to schedule.  Venous stasis ulcers and excoriations on posterior calves-Unna boot wrap today he will follow-up on Friday to recheck for wound management I did send her some mupirocin ointment to use once were done with Unna boots as needed.  He is up about 5 pounds which I do think is fluids so just make sure he takes his furosemide today when he gets home.   Epigastric pain-he is not having any additional GI symptoms so I do think it could be related to his restrictive lung disease would like to get him scheduled with the pulmonologist here locally.   Return in about 3 days (around 06/14/2023) for Nurse visit  - Unna boots.     Nani Gasser, MD

## 2023-06-11 NOTE — Telephone Encounter (Signed)
Left a message advising the need for a follow up colonoscopy.

## 2023-06-11 NOTE — Patient Instructions (Signed)
Male sure to take your lasix today.

## 2023-06-12 ENCOUNTER — Encounter (HOSPITAL_COMMUNITY): Payer: Medicare HMO

## 2023-06-12 ENCOUNTER — Telehealth: Payer: Self-pay | Admitting: Family Medicine

## 2023-06-12 ENCOUNTER — Encounter (HOSPITAL_COMMUNITY): Admission: RE | Admit: 2023-06-12 | Payer: Medicare HMO | Source: Ambulatory Visit

## 2023-06-12 LAB — CBC WITH DIFFERENTIAL/PLATELET
Basophils Absolute: 52 cells/uL (ref 0–200)
Eosinophils Absolute: 178 cells/uL (ref 15–500)
Eosinophils Relative: 2.4 %
Lymphs Abs: 2553 cells/uL (ref 850–3900)
MCH: 31.3 pg (ref 27.0–33.0)
MCV: 94.7 fL (ref 80.0–100.0)
Monocytes Relative: 8.3 %
RBC: 3.93 10*6/uL — ABNORMAL LOW (ref 4.20–5.80)
RDW: 11.6 % (ref 11.0–15.0)

## 2023-06-12 LAB — LIPASE: Lipase: 15 U/L (ref 7–60)

## 2023-06-12 NOTE — Progress Notes (Signed)
No sign of pancreatitis.  No sign of acute infection.

## 2023-06-12 NOTE — Telephone Encounter (Signed)
Thank you for the heads up, pain must not been that bad.  Fwd to PCP

## 2023-06-12 NOTE — Telephone Encounter (Signed)
[facility] called stated patient did not show for III Phase Bone Scan today at 9:00am They wanted to let his provider know

## 2023-06-14 ENCOUNTER — Ambulatory Visit (INDEPENDENT_AMBULATORY_CARE_PROVIDER_SITE_OTHER): Payer: Medicare HMO | Admitting: Family Medicine

## 2023-06-14 VITALS — BP 134/69 | HR 62

## 2023-06-14 DIAGNOSIS — L97929 Non-pressure chronic ulcer of unspecified part of left lower leg with unspecified severity: Secondary | ICD-10-CM

## 2023-06-14 DIAGNOSIS — I83019 Varicose veins of right lower extremity with ulcer of unspecified site: Secondary | ICD-10-CM

## 2023-06-14 DIAGNOSIS — L97919 Non-pressure chronic ulcer of unspecified part of right lower leg with unspecified severity: Secondary | ICD-10-CM | POA: Diagnosis not present

## 2023-06-14 DIAGNOSIS — I83029 Varicose veins of left lower extremity with ulcer of unspecified site: Secondary | ICD-10-CM

## 2023-06-14 NOTE — Progress Notes (Signed)
   Established Patient Office Visit  Subjective   Patient ID: Jerome Hicks, male    DOB: 30-Sep-1951  Age: 72 y.o. MRN: [id number]  Chief Complaint  Patient presents with   Wound Check    HPI  Jerome Hicks is here for removal of unna boot and wound check.   ROS    Objective:     BP 134/69   Pulse 62   SpO2 99%    Physical Exam   No results found for any visits on 06/14/23.    The ASCVD Risk score (Arnett DK, et al., 2019) failed to calculate for the following reasons:   The valid total cholesterol range is 130 to 320 mg/dL    Assessment & Plan:  Wound check - Wound is healing well.    Problem List Items Addressed This Visit   None   Return if symptoms worsen or fail to improve.    Esmond Harps, CMA

## 2023-06-14 NOTE — Progress Notes (Signed)
Right lower leg looks much better left lower leg is also healing really well no active drainage.  Edema seems to be fairly well-controlled today.  Will be able to discontinue the Unna boots.  Keep elevated avoid increase fluid intake.  Okay to use the mupirocin ointment twice a day for a week.  Call if any new or recurring symptoms.

## 2023-06-19 ENCOUNTER — Telehealth: Payer: Self-pay | Admitting: Family Medicine

## 2023-06-19 NOTE — Telephone Encounter (Signed)
Marisue Ivan with [facility] called. Did we get STOP med request on Warfarin for patient? Procedure is scheduled for August 14.

## 2023-06-19 NOTE — Telephone Encounter (Signed)
Ok to hold Warfarin for 4 days prior to scope. Letter faxed back to [facility]

## 2023-06-20 NOTE — Progress Notes (Signed)
Hi Severo, on the cardiac CT score that they did they also did an over read on the other areas in your chest.  So they did note that dilatation of the ascending aorta that we already knew about so they recommend continuing with yearly follow-up.  There is also still some plaque formation around the aorta.  In that diaphragm on the right that sitting a little high and not working very well is still there no major changes there is a little bit of scarring at the base of the lung there.  That can definitely affect some of your breathing.  And there is also a small bit of fluid around that right lung.  Thus, I would really like to get you in with a pulmonologist or lung specialist.  If you are okay with that then please let me know I think we need some additional help to workup the breathing issues and this could be contributing.  Also if you have a preference for provider or location then let me know that as well.

## 2023-06-20 NOTE — Telephone Encounter (Signed)
Medication form faxed and confirmation received.

## 2023-06-26 ENCOUNTER — Telehealth: Payer: Self-pay | Admitting: Family Medicine

## 2023-06-26 ENCOUNTER — Ambulatory Visit (INDEPENDENT_AMBULATORY_CARE_PROVIDER_SITE_OTHER): Payer: Medicare HMO | Admitting: Family Medicine

## 2023-06-26 ENCOUNTER — Encounter: Payer: Medicare HMO | Admitting: Gastroenterology

## 2023-06-26 VITALS — BP 130/68 | HR 60

## 2023-06-26 DIAGNOSIS — I4891 Unspecified atrial fibrillation: Secondary | ICD-10-CM

## 2023-06-26 LAB — POCT INR: INR: 3.1 — AB (ref 2.0–3.0)

## 2023-06-26 MED ORDER — SEMAGLUTIDE (2 MG/DOSE) 8 MG/3ML ~~LOC~~ SOPN: 2.0000 mg | PEN_INJECTOR | SUBCUTANEOUS | Status: DC

## 2023-06-26 NOTE — Telephone Encounter (Signed)
Meds ordered this encounter  Medications   Semaglutide, 2 MG/DOSE, 8 MG/3ML SOPN    Sig: Inject 2 mg as directed once a week.

## 2023-06-26 NOTE — Progress Notes (Signed)
See anticoagulation flowsheet for adjustments. 

## 2023-06-27 LAB — HM DIABETES EYE EXAM

## 2023-06-27 NOTE — Telephone Encounter (Signed)
Form completed,faxed,confirmation received and scanned into patient's chart.

## 2023-07-05 ENCOUNTER — Telehealth: Payer: Self-pay | Admitting: Family Medicine

## 2023-07-05 NOTE — Telephone Encounter (Signed)
, °

## 2023-07-05 NOTE — Telephone Encounter (Signed)
Patient advised.

## 2023-07-05 NOTE — Telephone Encounter (Signed)
Call pt" eye exam normal. Repeat in one year

## 2023-07-08 ENCOUNTER — Telehealth: Payer: Self-pay | Admitting: Family Medicine

## 2023-07-08 NOTE — Telephone Encounter (Signed)
Pt called and states that Tonya sent in his [employer] Forms for him as requested, but its missing a date and he would like for you to put the date and resend. Any questions, call him at [phone]

## 2023-07-10 NOTE — Telephone Encounter (Signed)
Pt's forms have been dated, sent and scanned into his chart

## 2023-07-11 ENCOUNTER — Ambulatory Visit: Payer: Medicare HMO

## 2023-07-17 DIAGNOSIS — Z1211 Encounter for screening for malignant neoplasm of colon: Secondary | ICD-10-CM | POA: Diagnosis not present

## 2023-07-17 DIAGNOSIS — D124 Benign neoplasm of descending colon: Secondary | ICD-10-CM | POA: Diagnosis not present

## 2023-07-17 DIAGNOSIS — D123 Benign neoplasm of transverse colon: Secondary | ICD-10-CM | POA: Diagnosis not present

## 2023-07-17 DIAGNOSIS — Z86010 Personal history of colonic polyps: Secondary | ICD-10-CM | POA: Diagnosis not present

## 2023-07-17 DIAGNOSIS — I4891 Unspecified atrial fibrillation: Secondary | ICD-10-CM | POA: Diagnosis not present

## 2023-07-17 DIAGNOSIS — Z09 Encounter for follow-up examination after completed treatment for conditions other than malignant neoplasm: Secondary | ICD-10-CM | POA: Diagnosis not present

## 2023-07-18 ENCOUNTER — Encounter: Payer: Self-pay | Admitting: Family Medicine

## 2023-07-19 ENCOUNTER — Encounter: Payer: Self-pay | Admitting: Family Medicine

## 2023-07-24 ENCOUNTER — Ambulatory Visit (INDEPENDENT_AMBULATORY_CARE_PROVIDER_SITE_OTHER): Payer: Medicare HMO | Admitting: Family Medicine

## 2023-07-24 VITALS — BP 134/84 | HR 62

## 2023-07-24 DIAGNOSIS — Z23 Encounter for immunization: Secondary | ICD-10-CM

## 2023-07-24 DIAGNOSIS — I4891 Unspecified atrial fibrillation: Secondary | ICD-10-CM

## 2023-07-24 LAB — POCT INR: INR: 2.8 (ref 2.0–3.0)

## 2023-07-24 NOTE — Progress Notes (Signed)
See Coumadin flowsheet.  INR at goal today.

## 2023-07-24 NOTE — Progress Notes (Signed)
Fluad/flu - Patient tolerated injection well without complications.

## 2023-07-26 ENCOUNTER — Telehealth: Payer: Self-pay

## 2023-07-26 NOTE — Telephone Encounter (Signed)
Patient advised. Samples ready for pick up.

## 2023-07-26 NOTE — Telephone Encounter (Signed)
Do we have a sampe of the 0.5 we could give to to restart for 2 weeks before going to the 2mg ?

## 2023-07-26 NOTE — Telephone Encounter (Addendum)
We received 4- 2 mg/3 ml of Ozempic. I called Jerome Hicks and he has been off the medication for 2 months. Can he restart at this dose?  Ozempic 2 mg/3 ml NDC - 4098-1191-47 Lot [id number]

## 2023-07-30 DIAGNOSIS — J449 Chronic obstructive pulmonary disease, unspecified: Secondary | ICD-10-CM | POA: Diagnosis not present

## 2023-07-30 DIAGNOSIS — R0602 Shortness of breath: Secondary | ICD-10-CM | POA: Diagnosis not present

## 2023-07-30 DIAGNOSIS — J61 Pneumoconiosis due to asbestos and other mineral fibers: Secondary | ICD-10-CM | POA: Diagnosis not present

## 2023-08-06 ENCOUNTER — Other Ambulatory Visit: Payer: Self-pay | Admitting: Family Medicine

## 2023-08-06 NOTE — Telephone Encounter (Signed)
Prescription Request  08/06/2023  LOV: 06/14/2023  What is the name of the medication or equipment? traMADol (ULTRAM) 50 MG tablet   Have you contacted your pharmacy to request a refill? Yes   Which pharmacy would you like this sent to?   [facility] [address] [place] - [address] [place] [address] Phone: [phone] Fax: [phone]     Patient notified that their request is being sent to the clinical staff for review and that they should receive a response within 2 business days.   Please advise at [facility] [phone]

## 2023-08-08 MED ORDER — TRAMADOL HCL 50 MG PO TABS: 50.0000 mg | ORAL_TABLET | Freq: Three times a day (TID) | ORAL | 1 refills | Status: DC | PRN

## 2023-08-27 ENCOUNTER — Ambulatory Visit (INDEPENDENT_AMBULATORY_CARE_PROVIDER_SITE_OTHER): Payer: Medicare HMO | Admitting: Family Medicine

## 2023-08-27 VITALS — BP 128/77 | HR 81 | Ht 74.0 in | Wt 284.0 lb

## 2023-08-27 DIAGNOSIS — I4821 Permanent atrial fibrillation: Secondary | ICD-10-CM | POA: Diagnosis not present

## 2023-08-27 LAB — POCT INR: INR: 3.3 — AB (ref 2.0–3.0)

## 2023-08-27 NOTE — Progress Notes (Signed)
For INR check.  He has lost about 20 pounds. Change to 2.5mg  daily.

## 2023-08-28 DIAGNOSIS — J449 Chronic obstructive pulmonary disease, unspecified: Secondary | ICD-10-CM | POA: Diagnosis not present

## 2023-08-28 DIAGNOSIS — J986 Disorders of diaphragm: Secondary | ICD-10-CM | POA: Diagnosis not present

## 2023-08-28 DIAGNOSIS — J9811 Atelectasis: Secondary | ICD-10-CM | POA: Diagnosis not present

## 2023-08-28 DIAGNOSIS — R0600 Dyspnea, unspecified: Secondary | ICD-10-CM | POA: Diagnosis not present

## 2023-08-29 NOTE — Progress Notes (Signed)
Attempted call to patient. Left a voice mail message to return our call.

## 2023-09-05 NOTE — Progress Notes (Signed)
Left message for a return call

## 2023-09-10 ENCOUNTER — Encounter: Payer: Self-pay | Admitting: Family Medicine

## 2023-09-10 ENCOUNTER — Ambulatory Visit (INDEPENDENT_AMBULATORY_CARE_PROVIDER_SITE_OTHER): Payer: Medicare HMO | Admitting: Family Medicine

## 2023-09-10 VITALS — BP 120/65 | HR 70 | Ht 74.0 in | Wt 269.1 lb

## 2023-09-10 DIAGNOSIS — I482 Chronic atrial fibrillation, unspecified: Secondary | ICD-10-CM

## 2023-09-10 LAB — POCT INR: INR: 2.9 (ref 2.0–3.0)

## 2023-09-10 NOTE — Progress Notes (Signed)
Patient reports taking warfarin 2.5 mg, (Mon/Tues/Wed/Thur/Fri/Sat) and 5 mg (Sun).   INR results were 2.9.

## 2023-09-10 NOTE — Progress Notes (Signed)
No change to current regimen since he is 2.9 today.  Recheck in 2 weeks.

## 2023-09-24 ENCOUNTER — Ambulatory Visit (INDEPENDENT_AMBULATORY_CARE_PROVIDER_SITE_OTHER): Payer: Medicare HMO | Admitting: Family Medicine

## 2023-09-24 VITALS — BP 137/87 | HR 84 | Ht 74.0 in | Wt 286.2 lb

## 2023-09-24 DIAGNOSIS — I482 Chronic atrial fibrillation, unspecified: Secondary | ICD-10-CM

## 2023-09-24 LAB — POCT INR
INR: 2.4 (ref 2.0–3.0)
POC INR: 2.4

## 2023-09-24 NOTE — Progress Notes (Signed)
Recheck 3 weeks

## 2023-09-24 NOTE — Progress Notes (Signed)
Nurse visit

## 2023-09-29 ENCOUNTER — Other Ambulatory Visit: Payer: Self-pay | Admitting: Family Medicine

## 2023-09-29 DIAGNOSIS — I10 Essential (primary) hypertension: Secondary | ICD-10-CM

## 2023-10-07 ENCOUNTER — Other Ambulatory Visit: Payer: Self-pay | Admitting: Family Medicine

## 2023-10-07 NOTE — Telephone Encounter (Signed)
Patient called he is requesting a refill on Tramadol 50mg  please submit to [facility] [address] [place] [address] phone [phone]

## 2023-10-08 NOTE — Telephone Encounter (Signed)
Routing to pcp for signature

## 2023-10-09 ENCOUNTER — Ambulatory Visit (INDEPENDENT_AMBULATORY_CARE_PROVIDER_SITE_OTHER): Payer: Medicare HMO | Admitting: Family Medicine

## 2023-10-09 VITALS — BP 137/68 | HR 80

## 2023-10-09 DIAGNOSIS — I4891 Unspecified atrial fibrillation: Secondary | ICD-10-CM | POA: Diagnosis not present

## 2023-10-09 LAB — POCT INR: INR: 2.2 (ref 2.0–3.0)

## 2023-10-09 MED ORDER — TRAMADOL HCL 50 MG PO TABS: 50.0000 mg | ORAL_TABLET | Freq: Three times a day (TID) | ORAL | 1 refills | Status: DC | PRN

## 2023-10-09 NOTE — Progress Notes (Signed)
Recheck in 4 weeks 

## 2023-10-10 NOTE — Progress Notes (Signed)
Left message advising of recommendations.  

## 2023-10-18 ENCOUNTER — Telehealth: Payer: Self-pay

## 2023-10-18 NOTE — Telephone Encounter (Signed)
Left message for patient to come by to pick up Ozempic. From Patient Assistance Program.

## 2023-10-23 ENCOUNTER — Other Ambulatory Visit: Payer: Self-pay | Admitting: Family Medicine

## 2023-10-23 DIAGNOSIS — I482 Chronic atrial fibrillation, unspecified: Secondary | ICD-10-CM

## 2023-10-30 ENCOUNTER — Other Ambulatory Visit: Payer: Self-pay | Admitting: Family Medicine

## 2023-10-30 DIAGNOSIS — I10 Essential (primary) hypertension: Secondary | ICD-10-CM

## 2023-11-08 ENCOUNTER — Ambulatory Visit (INDEPENDENT_AMBULATORY_CARE_PROVIDER_SITE_OTHER): Payer: Medicare HMO | Admitting: Family Medicine

## 2023-11-08 VITALS — BP 128/77 | HR 90 | Ht 74.0 in | Wt 291.0 lb

## 2023-11-08 DIAGNOSIS — I4891 Unspecified atrial fibrillation: Secondary | ICD-10-CM | POA: Diagnosis not present

## 2023-11-08 LAB — POCT INR: INR: 2.1 (ref 2.0–3.0)

## 2023-11-08 NOTE — Patient Instructions (Signed)
RTC on Monday to have legs checked for possible unna boot

## 2023-11-08 NOTE — Progress Notes (Signed)
Pt here for INR check no missed doses, diet or medication changes,ABX use,bruising, abnormal bleeding, blood in urine or stool, CP,SOB, hospitalization, or dental procedures.  He is taking 2.5 mg every day except  5 mg on Sunday.  Pt has had bilateral swelling in lower legs. Plan is to have him to take two (2) Lasix 40 mg today,Saturday and Sunday. He will check his weight daily Dr. Linford Arnold is looking for him to be down by 10 lbs. He will  RTC on Monday to possibly have bilateral unna boots done this hasn't resolved. Pt voiced understanding and agreed.   Pt to RTC in 4wks for INR Check

## 2023-11-11 ENCOUNTER — Ambulatory Visit (INDEPENDENT_AMBULATORY_CARE_PROVIDER_SITE_OTHER): Payer: Medicare HMO

## 2023-11-11 VITALS — BP 119/71 | HR 77 | Resp 20 | Ht 74.0 in | Wt 288.1 lb

## 2023-11-11 DIAGNOSIS — L03115 Cellulitis of right lower limb: Secondary | ICD-10-CM | POA: Diagnosis not present

## 2023-11-11 MED ORDER — DOXYCYCLINE HYCLATE 100 MG PO TABS: 100.0000 mg | ORAL_TABLET | Freq: Two times a day (BID) | ORAL | 0 refills | Status: DC

## 2023-11-11 NOTE — Progress Notes (Signed)
Patient here today to have his right leg rechecked after doubling his diuretic over the weekend.  The PCP suggested to place [facility] on the patients right leg.  Patient advised to return Thursday or Friday to remove [facility] and recheck right leg. Patient preferred to come back on Friday.

## 2023-11-11 NOTE — Progress Notes (Signed)
Swelling in the right leg does look better but there is still 1+ pitting edema some crusting along the sores that are there as well as some significant erythema from just below the knee down to his ankle.  I am going to go ahead and treat for cellulitis with doxycycline.  And wrap with Unna boot for compression and healing.  Follow-up in 4 to 5 days to remove wrap and repeat wrapping if needed.  He is only down about 4 pounds on his home scale and 5 pounds on our scale I still would like for him to get another 4 pounds also continue to add the extra diuretic daily until he is down another 4 pounds and then can go back to 1 tab daily on the furosemide.

## 2023-11-15 ENCOUNTER — Ambulatory Visit (INDEPENDENT_AMBULATORY_CARE_PROVIDER_SITE_OTHER): Payer: Medicare HMO | Admitting: Physician Assistant

## 2023-11-15 ENCOUNTER — Encounter: Payer: Self-pay | Admitting: Physician Assistant

## 2023-11-15 VITALS — BP 101/61 | HR 90 | Ht 73.0 in | Wt 279.0 lb

## 2023-11-15 DIAGNOSIS — L03115 Cellulitis of right lower limb: Secondary | ICD-10-CM | POA: Diagnosis not present

## 2023-11-15 NOTE — Progress Notes (Signed)
Finish doxycycline.  Continue lasix 80mg  for 4 more days then drop down to 40mg .  Unna boot placed today Ok to take off Monday or Tuesday Follow up in 1 week.

## 2023-11-15 NOTE — Progress Notes (Signed)
Pt here to have unna boot removed from R leg.   He has been taking the Furosemide 40 mg 2 tablets daily and the Doxycycline 100 mg bid as directed. He reports that he has 8 tabs of the Doxycycline left.    The swelling in his R leg has gone down since his last OV, and he has lost 9lbs.   Pt was advised to continue furosemide 40 mg 2 tablets daily for the next 4 days and down to 1 tablet daily.   Pt to f/u in 1 week for removal and recheck

## 2023-11-22 ENCOUNTER — Ambulatory Visit (INDEPENDENT_AMBULATORY_CARE_PROVIDER_SITE_OTHER): Payer: Medicare HMO | Admitting: Family Medicine

## 2023-11-22 VITALS — BP 127/82 | HR 78 | Ht 73.0 in | Wt 293.0 lb

## 2023-11-22 DIAGNOSIS — I872 Venous insufficiency (chronic) (peripheral): Secondary | ICD-10-CM

## 2023-11-22 DIAGNOSIS — E8770 Fluid overload, unspecified: Secondary | ICD-10-CM

## 2023-11-22 DIAGNOSIS — I4891 Unspecified atrial fibrillation: Secondary | ICD-10-CM | POA: Diagnosis not present

## 2023-11-22 LAB — POCT INR
INR: 2.9 (ref 2.0–3.0)
POC INR: 2.9

## 2023-11-22 NOTE — Progress Notes (Signed)
INR at goal. Recheck in 3-4 weeks.

## 2023-11-22 NOTE — Progress Notes (Signed)
OK to go back up to 80mg  lasix. Make sure limiting fluid intake to no more than 50 oz a day totol. Make sure limiting slat to nomore than 2000mg  per day

## 2023-11-22 NOTE — Progress Notes (Signed)
Pt here to have his R leg checked after having unna boot on for 1 week. He was taking the lasix 80 mg until Monday and he switched over to 40 mg. He has been taking 40 mg since Tuesday.   The areas on his R leg have healed and the swelling has gone down however his weight has increased by 14 lbs.   Pt wanted to have his INR checked while he was here today he is 1 week early. He denies any missed doses, diet or medication changes,ABX use,bruising, abnormal bleeding, blood in urine or stool, CP,SOB, hospitalization, or dental procedures.  He taking 2.5 mg daily except on Sundays he takes 5 mg.   INR today was 2.9

## 2023-11-22 NOTE — Progress Notes (Signed)
Patient advised.

## 2023-11-29 ENCOUNTER — Ambulatory Visit: Payer: Medicare HMO

## 2023-12-07 ENCOUNTER — Other Ambulatory Visit: Payer: Self-pay | Admitting: Family Medicine

## 2023-12-10 ENCOUNTER — Other Ambulatory Visit: Payer: Self-pay

## 2023-12-10 MED ORDER — TRAMADOL HCL 50 MG PO TABS: 50.0000 mg | ORAL_TABLET | Freq: Three times a day (TID) | ORAL | 1 refills | Status: DC | PRN

## 2023-12-10 NOTE — Telephone Encounter (Signed)
 The refill request was denied. It is time for him to have another refill.   Last fill 10/09/23 with 1 refill.   Pended prescription.

## 2023-12-10 NOTE — Telephone Encounter (Signed)
 Copied from CRM [id number]. Topic: Clinical - Medication Refill >> Dec 09, 2023 10:45 AM Joesph PARAS wrote: Most Recent Primary Care Visit:  Provider: METHENEY, CATHERINE D  Department: [facility]  Visit Type: NURSE VISIT  Date: 11/22/2023  Medication:  traMADol  (ULTRAM ) 50 MG tablet    Has the patient contacted their pharmacy? Yes (Agent: If no, request that the patient contact the pharmacy for the refill. If patient does not wish to contact the pharmacy document the reason why and proceed with request.) (Agent: If yes, when and what did the pharmacy advise?)  Is this the correct pharmacy for this prescription? Yes If no, delete pharmacy and type the correct one.  This is the patient's preferred pharmacy:  [facility] [address] [place] - [address] [place] [address] Phone: [phone] Fax: [phone]    Has the prescription been filled recently? Yes  Is the patient out of the medication? Yes  Has the patient been seen for an appointment in the last year OR does the patient have an upcoming appointment? Yes  Can we respond through MyChart? No - Cell Phone  Agent: Please be advised that Rx refills may take up to 3 business days. We ask that you follow-up with your pharmacy.

## 2023-12-25 ENCOUNTER — Ambulatory Visit (INDEPENDENT_AMBULATORY_CARE_PROVIDER_SITE_OTHER): Payer: Medicare HMO | Admitting: Family Medicine

## 2023-12-25 ENCOUNTER — Telehealth: Payer: Self-pay | Admitting: Family Medicine

## 2023-12-25 ENCOUNTER — Encounter: Payer: Self-pay | Admitting: Family Medicine

## 2023-12-25 VITALS — BP 118/60 | HR 77 | Ht 74.0 in | Wt 304.8 lb

## 2023-12-25 DIAGNOSIS — I7789 Other specified disorders of arteries and arterioles: Secondary | ICD-10-CM

## 2023-12-25 DIAGNOSIS — J449 Chronic obstructive pulmonary disease, unspecified: Secondary | ICD-10-CM | POA: Insufficient documentation

## 2023-12-25 DIAGNOSIS — Z7689 Persons encountering health services in other specified circumstances: Secondary | ICD-10-CM

## 2023-12-25 DIAGNOSIS — I10 Essential (primary) hypertension: Secondary | ICD-10-CM | POA: Diagnosis not present

## 2023-12-25 DIAGNOSIS — I7121 Aneurysm of the ascending aorta, without rupture: Secondary | ICD-10-CM | POA: Diagnosis not present

## 2023-12-25 DIAGNOSIS — I4821 Permanent atrial fibrillation: Secondary | ICD-10-CM | POA: Diagnosis not present

## 2023-12-25 DIAGNOSIS — N1831 Chronic kidney disease, stage 3a: Secondary | ICD-10-CM

## 2023-12-25 DIAGNOSIS — J61 Pneumoconiosis due to asbestos and other mineral fibers: Secondary | ICD-10-CM | POA: Diagnosis not present

## 2023-12-25 DIAGNOSIS — R7309 Other abnormal glucose: Secondary | ICD-10-CM | POA: Diagnosis not present

## 2023-12-25 LAB — POCT GLYCOSYLATED HEMOGLOBIN (HGB A1C): Hemoglobin A1C: 4.7 % (ref 4.0–5.6)

## 2023-12-25 LAB — POCT INR: POC INR: 2

## 2023-12-25 MED ORDER — TIRZEPATIDE-WEIGHT MANAGEMENT 2.5 MG/0.5ML ~~LOC~~ SOLN: 2.5000 mg | SUBCUTANEOUS | 0 refills | Status: DC

## 2023-12-25 MED ORDER — MUPIROCIN 2 % EX OINT: TOPICAL_OINTMENT | Freq: Two times a day (BID) | CUTANEOUS | 1 refills | Status: DC | PRN

## 2023-12-25 NOTE — Assessment & Plan Note (Signed)
INR 2.0. recheck in 2 weeks.

## 2023-12-25 NOTE — Assessment & Plan Note (Addendum)
He just got to the point where he felt like the Ozempic was no longer helping.  He says he eats about half of what he used to eat.  Just encouraged him to continue to really put his focus on food choices and diet and staying active.  He would like to see if we could get the Zepbound approved.  Will send over prescription today and we will see if we can initiate a prior authorization.   Visit #: 4 Starting Weight:311   Current weight: 304 lbs/BMI 39  Previous weight:294 lb  Change in weight: Up 10 lbs  Goal weight: 200 lbs  Dietary goals: Continue to work on portion control and eating vegetables first and then protein and finally carbs. Exercise goals: Stay active and keep moving. Medication: Ozempic  2 mg weekly.. . Follow-up and referrals: 8 Weeks.

## 2023-12-25 NOTE — Telephone Encounter (Signed)
 Needs PA for Zepbound.

## 2023-12-25 NOTE — Assessment & Plan Note (Signed)
See note under asbestosis.

## 2023-12-25 NOTE — Assessment & Plan Note (Signed)
He has been getting spirometry for years in [facility] with Dr. Esau Grew but has established locally with Dr. Jerre Simon for pulmonology care.

## 2023-12-25 NOTE — Assessment & Plan Note (Signed)
7/24: Aneurysmal dilatation of the ascending aorta, 4.3 cm.  Recommend annual imaging followup by CTA or MRA. Repeat 7/25

## 2023-12-25 NOTE — Progress Notes (Signed)
Established Patient Office Visit  Subjective  Patient ID: Jerome Hicks, male    DOB: [birth year]  Age: 73 y.o. MRN: [id number]  Chief Complaint  Patient presents with   Hypertension   Atrial Fibrillation    INR today = 2.0   abnormal glucose    A1C today    HPI  Off of Ozempic for about a month now.  Weight has gone up and was 10 pounds in the last month.  Due for INR check today No diet changes  Says not swelling or LE sores or lesions today.     ROS    Objective:     BP 118/60   Pulse 77   Ht 6\' 2"  (1.88 m)   Wt (!) 304 lb 12 oz (138.2 kg)   SpO2 99%   BMI 39.13 kg/m    Physical Exam Vitals and nursing note reviewed.  Constitutional:      Appearance: Normal appearance.  HENT:     Head: Normocephalic and atraumatic.  Eyes:     Conjunctiva/sclera: Conjunctivae normal.  Cardiovascular:     Rate and Rhythm: Normal rate. Rhythm irregular.  Pulmonary:     Effort: Pulmonary effort is normal.     Breath sounds: Normal breath sounds.  Skin:    General: Skin is warm and dry.  Neurological:     Mental Status: He is alert.  Psychiatric:        Mood and Affect: Mood normal.      Results for orders placed or performed in visit on 12/25/23  POCT HgB A1C  Result Value Ref Range   Hemoglobin A1C 4.7 4.0 - 5.6 %   HbA1c POC (<> result, manual entry)     HbA1c, POC (prediabetic range)     HbA1c, POC (controlled diabetic range)    POCT INR  Result Value Ref Range   INR     POC INR 2.0       The ASCVD Risk score (Arnett DK, et al., 2019) failed to calculate for the following reasons:   The valid total cholesterol range is 130 to 320 mg/dL    Assessment & Plan:   Problem List Items Addressed This Visit       Cardiovascular and Mediastinum   Essential hypertension, benign - Primary   Well controlled. Continue current regimen. Follow up in  6 mo       Atrial fibrillation (HCC)   INR 2.0. recheck in 2 weeks.       Relevant Orders   POCT INR  (Completed)   Aneurysm of ascending aorta without rupture (HCC)   7/24: Aneurysmal dilatation of the ascending aorta, 4.3 cm.  Recommend annual imaging followup by CTA or MRA. Repeat 7/25        Respiratory   COPD (chronic obstructive pulmonary disease) (HCC)   See note under asbestosis.      Asbestosis [facility])   He has been getting spirometry for years in [facility] with Dr. Esau Hicks but has established locally with Dr. Jerre Hicks for pulmonology care.        Genitourinary   CKD (chronic kidney disease) stage 3, GFR 30-59 ml/min (HCC)     Other   Encounter for weight management   He just got to the point where he felt like the Ozempic was no longer helping.  He says he eats about half of what he used to eat.  Just encouraged him to continue to really put his focus on  food choices and diet and staying active.  He would like to see if we could get the Zepbound approved.  Will send over prescription today and we will see if we can initiate a prior authorization.   Visit #: 4 Starting Weight:311   Current weight: 304 lbs/BMI 39  Previous weight:294 lb  Change in weight: Up 10 lbs  Goal weight: 200 lbs  Dietary goals: Continue to work on portion control and eating vegetables first and then protein and finally carbs. Exercise goals: Stay active and keep moving. Medication: Ozempic  2 mg weekly.. . Follow-up and referrals: 8 Weeks.      Relevant Medications   tirzepatide (ZEPBOUND) 2.5 MG/0.5ML injection vial   Aortic root enlargement (HCC)   Due for repeat imaging 7/25      Other Visit Diagnoses       Abnormal glucose       Relevant Orders   POCT HgB A1C (Completed)       Return in about 2 weeks (around 01/08/2024) for Nurse visit INR.    Jerome Gasser, MD

## 2023-12-25 NOTE — Assessment & Plan Note (Signed)
Well controlled. Continue current regimen. Follow up in  6 mo  

## 2023-12-25 NOTE — Assessment & Plan Note (Signed)
Due for repeat imaging 7/25

## 2024-01-08 ENCOUNTER — Ambulatory Visit (INDEPENDENT_AMBULATORY_CARE_PROVIDER_SITE_OTHER): Payer: Medicare HMO | Admitting: Family Medicine

## 2024-01-08 ENCOUNTER — Encounter: Payer: Self-pay | Admitting: Family Medicine

## 2024-01-08 VITALS — BP 134/72 | HR 61 | Ht 74.0 in

## 2024-01-08 DIAGNOSIS — I4821 Permanent atrial fibrillation: Secondary | ICD-10-CM | POA: Diagnosis not present

## 2024-01-08 LAB — POCT INR
INR: 2.2 (ref 2.0–3.0)
INR: 2.5 (ref 2.0–3.0)
INR: 2.5 (ref 2.0–3.0)
INR: 2.5 (ref 2.0–3.0)

## 2024-01-08 NOTE — Progress Notes (Signed)
 Recheck in 3 weeks

## 2024-01-11 ENCOUNTER — Other Ambulatory Visit: Payer: Self-pay | Admitting: Family Medicine

## 2024-01-11 DIAGNOSIS — I10 Essential (primary) hypertension: Secondary | ICD-10-CM

## 2024-01-15 NOTE — Telephone Encounter (Signed)
Do we know where this is at?

## 2024-01-15 NOTE — Telephone Encounter (Signed)
Prior auth for: ZEPBOUND Determination: Pending as of 01/15/24 Auth #: [id number] Valid from: n/a Patient notified via MyChart

## 2024-01-22 ENCOUNTER — Ambulatory Visit (INDEPENDENT_AMBULATORY_CARE_PROVIDER_SITE_OTHER): Payer: Medicare HMO | Admitting: Family Medicine

## 2024-01-22 ENCOUNTER — Telehealth: Payer: Self-pay

## 2024-01-22 VITALS — BP 137/88 | HR 66 | Temp 97.7°F | Ht 74.0 in | Wt 316.0 lb

## 2024-01-22 DIAGNOSIS — I480 Paroxysmal atrial fibrillation: Secondary | ICD-10-CM

## 2024-01-22 LAB — POCT INR: INR: 1.9 — AB (ref 2.0–3.0)

## 2024-01-22 NOTE — Telephone Encounter (Signed)
 This 1 seems pretty cut and dry.  I do not think an appeal would help since he does not have sleep apnea.  But let me know what you think.  If you think it is worthwhile we can try to do it.  Just let me know.

## 2024-01-22 NOTE — Progress Notes (Signed)
 He also says that his medication from [facility] was denied for his GLP-1 and wants to know what we could do to try to get it approved.  Let him know that we did fill out the forms with his insurance and that they denied it.  We can certainly take another look at it to see what we can do.

## 2024-01-22 NOTE — Telephone Encounter (Signed)
 Patient was advised during the nurse visit of the insurance determination and given a copy of the denial. He did not seem surprised about the insurance denial for Zepbound. Patient mentioned he will discuss other options with provider at his next visit.

## 2024-01-22 NOTE — Telephone Encounter (Signed)
 Prior auth for: ZEPBOUND Determination: DENIED Auth #: [id number]  Reason: Plan exclusion. Anti-obesity medications remain excluded from Medicare Part D coverage. Zepbound is approved to treat moderate to severe obstructive sleep apnea (OSA) in adults with obesity.  Patient notified via MyChart

## 2024-01-31 NOTE — Telephone Encounter (Signed)
 Unfortunately the insurance did deny the Zepbound.  You are welcome to call your insurance to see if they would cover [facility] for weight loss which is the alternative.  If they will we can submitted authorization.  But it may not be covered at all with her current insurance plan.

## 2024-02-06 ENCOUNTER — Ambulatory Visit: Payer: Medicare HMO | Admitting: Family Medicine

## 2024-02-06 ENCOUNTER — Other Ambulatory Visit: Payer: Self-pay | Admitting: *Deleted

## 2024-02-06 VITALS — BP 127/83 | HR 71 | Ht 74.0 in | Wt 316.0 lb

## 2024-02-06 DIAGNOSIS — I4821 Permanent atrial fibrillation: Secondary | ICD-10-CM | POA: Diagnosis not present

## 2024-02-06 LAB — POCT INR: INR: 2.9 (ref 2.0–3.0)

## 2024-02-06 MED ORDER — TRAMADOL HCL 50 MG PO TABS: 50.0000 mg | ORAL_TABLET | Freq: Three times a day (TID) | ORAL | 1 refills | Status: DC | PRN

## 2024-02-06 NOTE — Progress Notes (Signed)
 Meds ordered this encounter  Medications   traMADol (ULTRAM) 50 MG tablet    Sig: Take 1 tablet (50 mg total) by mouth every 8 (eight) hours as needed.    Dispense:  90 tablet    Refill:  1

## 2024-02-07 NOTE — Telephone Encounter (Signed)
 Patient informed and will check with his insurance company for coverage of wegovy. He will reach out to [place] once he has this information.

## 2024-02-20 ENCOUNTER — Ambulatory Visit (INDEPENDENT_AMBULATORY_CARE_PROVIDER_SITE_OTHER): Admitting: Family Medicine

## 2024-02-20 VITALS — BP 141/70 | HR 68 | Ht 74.0 in | Wt 304.0 lb

## 2024-02-20 DIAGNOSIS — I4821 Permanent atrial fibrillation: Secondary | ICD-10-CM | POA: Diagnosis not present

## 2024-02-20 LAB — POCT INR: INR: 2.3 (ref 2.0–3.0)

## 2024-02-24 DIAGNOSIS — J449 Chronic obstructive pulmonary disease, unspecified: Secondary | ICD-10-CM | POA: Diagnosis not present

## 2024-02-24 DIAGNOSIS — Z1389 Encounter for screening for other disorder: Secondary | ICD-10-CM | POA: Diagnosis not present

## 2024-03-10 ENCOUNTER — Ambulatory Visit (INDEPENDENT_AMBULATORY_CARE_PROVIDER_SITE_OTHER)

## 2024-03-10 ENCOUNTER — Ambulatory Visit (INDEPENDENT_AMBULATORY_CARE_PROVIDER_SITE_OTHER): Admitting: Family Medicine

## 2024-03-10 ENCOUNTER — Other Ambulatory Visit: Payer: Self-pay

## 2024-03-10 VITALS — BP 138/72 | HR 61 | Ht 74.0 in | Wt 309.0 lb

## 2024-03-10 DIAGNOSIS — I4891 Unspecified atrial fibrillation: Secondary | ICD-10-CM | POA: Diagnosis not present

## 2024-03-10 DIAGNOSIS — Z0000 Encounter for general adult medical examination without abnormal findings: Secondary | ICD-10-CM

## 2024-03-10 LAB — POCT INR: INR: 2.3 (ref 2.0–3.0)

## 2024-03-10 MED ORDER — TRAMADOL HCL 50 MG PO TABS: 50.0000 mg | ORAL_TABLET | Freq: Three times a day (TID) | ORAL | 1 refills | Status: DC | PRN

## 2024-03-10 NOTE — Telephone Encounter (Signed)
 Last fill 02/06/24 last visit 12/25/23

## 2024-03-10 NOTE — Patient Instructions (Signed)
 Jerome Hicks , Thank you for taking time to come for your Medicare Wellness Visit. I appreciate your ongoing commitment to your health goals. Please review the following plan we discussed and let me know if I can assist you in the future.   These are the goals we discussed:  Goals       Medication Management      Patient Goals/Self-Care Activities Over the next 90 days, patient will:  take medications as prescribed and engage in dietary modifications by portion control & incorporating more exercise (patient-stated goal of weight loss)  Follow Up Plan: Telephone appointment with care management team member scheduled for: 3-6 months      Patient Stated (pt-stated)      09/18/2021 AWV Goal: Exercise for General Health  Patient will verbalize understanding of the benefits of increased physical activity: Exercising regularly is important. It will improve your overall fitness, flexibility, and endurance. Regular exercise also will improve your overall health. It can help you control your weight, reduce stress, and improve your bone density. Over the next year, patient will increase physical activity as tolerated with a goal of at least 150 minutes of moderate physical activity per week.  You can tell that you are exercising at a moderate intensity if your heart starts beating faster and you start breathing faster but can still hold a conversation. Moderate-intensity exercise ideas include: Walking 1 mile (1.6 km) in about 15 minutes Biking Hiking Golfing Dancing Water aerobics Patient will verbalize understanding of everyday activities that increase physical activity by providing examples like the following: Yard work, such as: Insurance underwriter Gardening Washing windows or floors Patient will be able to explain general safety guidelines for exercising:  Before you start a new exercise program, talk with  your health care provider. Do not exercise so much that you hurt yourself, feel dizzy, or get very short of breath. Wear comfortable clothes and wear shoes with good support. Drink plenty of water while you exercise to prevent dehydration or heat stroke. Work out until your breathing and your heartbeat get faster.       Patient Stated (pt-stated)      Patient stated that he would like to loose 100 lbs.      Weight (lb) < 200 lb (90.7 kg)      Wants to loose weight. States wants to loose 100lbs.      Weight (lb) < 200 lb (90.7 kg)      Recommend work on losing about 10 lbs over the next 3-4 months.  This will help with your swelling.  Encourage you to walk daily for about 20 minutes.        Weight (lb) < 270 lb (122.5 kg)      He would like to loss weight.         This is a list of the screening recommended for you and due dates:  Health Maintenance  Topic Date Due   Complete foot exam   Never done   Yearly kidney function blood test for diabetes  03/19/2024   COVID-19 Vaccine (6 - 2024-25 season) 04/07/2024   Yearly kidney health urinalysis for diabetes  05/08/2024   Hemoglobin A1C  06/23/2024   Eye exam for diabetics  06/26/2024   Flu Shot  07/03/2024   Medicare Annual Wellness Visit  03/10/2025   Colon Cancer Screening  07/16/2026   Pneumonia Vaccine  Completed  Hepatitis C Screening  Completed   Zoster (Shingles) Vaccine  Completed   HPV Vaccine  Aged Out   DTaP/Tdap/Td vaccine  Discontinued

## 2024-03-10 NOTE — Progress Notes (Signed)
4 wks

## 2024-03-10 NOTE — Progress Notes (Addendum)
 Subjective:   Jerome Hicks is a 73 y.o. male who presents for Medicare Annual/Subsequent preventive examination.  Visit Complete: In person  Patient Medicare AWV questionnaire was completed by the patient on n/a; I have confirmed that all information answered by patient is correct and no changes since this date.  Cardiac Risk Factors include: advanced age (>66men, >20 women);obesity (BMI >30kg/m2);smoking/ tobacco exposure;male gender;hypertension;family history of premature cardiovascular disease     Objective:    Today's Vitals   03/10/24 0809  BP: 138/72  Pulse: 61  SpO2: 97%  Weight: (!) 309 lb (140.2 kg)  Height: 6\' 2"  (1.88 m)   Body mass index is 39.67 kg/m.     03/10/2024    8:19 AM 02/22/2023    8:04 AM 09/18/2021    9:03 AM 07/15/2019    8:10 AM 08/20/2018   11:18 AM 08/01/2018    3:00 PM 08/01/2018    2:50 PM  Advanced Directives  Does Patient Have a Medical Advance Directive? No Yes Yes Yes Yes  Yes  Type of Advance Directive  Living will Living will Healthcare Power of [place];Living will Healthcare Power of [place];Living will Living will   Does patient want to make changes to medical advance directive?  No - Patient declined No - Patient declined No - Patient declined  No - Patient declined   Copy of Healthcare Power of Attorney in Chart?    No - copy requested     Would patient like information on creating a medical advance directive? No - Patient declined          Current Medications (verified) Outpatient Encounter Medications as of 03/10/2024  Medication Sig   albuterol (VENTOLIN HFA) 108 (90 Base) MCG/ACT inhaler 2 puffs every 6 (six) hours as needed.   amLODipine (NORVASC) 2.5 MG tablet TAKE 1 TABLET EVERY DAY   carvedilol (COREG) 25 MG tablet TAKE 1 TABLET TWICE DAILY WITH MEALS   clotrimazole-betamethasone (LOTRISONE) cream Apply 1 application topically daily as needed (rash). Dont use for more than 2 weeks at a time   doxazosin (CARDURA) 2 MG  tablet Take 1 tablet (2 mg total) by mouth daily.   furosemide (LASIX) 40 MG tablet Take 2 tablets (80 mg total) by mouth daily. TAKE 1 TO 2 TABLETS DAILY AS NEEDED FOR EDEMA.   meclizine (ANTIVERT) 25 MG tablet TAKE 1 TABLET THREE TIMES DAILY AS NEEDED FOR DIZZINESS OR NAUSEA AS DIRECTED   Multiple Vitamin (MULTIVITAMIN WITH MINERALS) TABS tablet Take 1 tablet by mouth daily.   mupirocin ointment (BACTROBAN) 2 % Apply topically 2 (two) times daily as needed. Don't use for more than 2 weeks at a time.   potassium chloride (KLOR-CON M) 10 MEQ tablet TAKE 1 TABLET EVERY DAY   rosuvastatin (CRESTOR) 10 MG tablet Take 1 tablet (10 mg total) by mouth at bedtime.   traMADol (ULTRAM) 50 MG tablet Take 1 tablet (50 mg total) by mouth every 8 (eight) hours as needed.   valsartan (DIOVAN) 320 MG tablet TAKE 1 TABLET EVERY DAY   warfarin (COUMADIN) 5 MG tablet TAKE 5MG  EVERY MONDAY AND THURSDAY. TAKE 2.5MG  ALL OTHER DAYS AS DIRECTED   [DISCONTINUED] tirzepatide (ZEPBOUND) 2.5 MG/0.5ML injection vial Inject 2.5 mg into the skin once a week.   No facility-administered encounter medications on file as of 03/10/2024.    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  Hospitalized from 11-14-2013 through 11-27-2013   Cataracts, bilateral    ED (erectile dysfunction)    has been prescribed Levitra and Viagrain in the past   Hypertension    Internal hemorrhoids    Kidney stones    Leg edema    recurrent cellulitis   Scrotal abscess 01/2002   Shoulder arthritis    Sigmoid diverticulosis    Past Surgical History:  Procedure Laterality Date    MRSA infection in left  groin requiring surgical debridement  12-08   CHOLECYSTECTOMY  1971   kidney stones  1990's   Ureteroscopic stone extraction   TONSILLECTOMY     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   Procedure: RIGHT TOTAL KNEE ARTHROPLASTY;  Surgeon: Jodi Geralds, MD;  Location: [facility];  Service: Orthopedics;  Laterality: Right;   Family History  Problem Relation Age of Onset   Heart attack Mother 62   Heart attack Father 25   Heart attack Sister 32   Breast cancer Sister    Heart attack Brother 55   Multiple sclerosis Daughter    Social History   Socioeconomic History   Marital status: Married    Spouse name: Becky   Number of children: 2   Years of education: 14   Highest education level: Associate degree: occupational, Scientist, product/process development, or vocational program  Occupational History   Occupation: On disability     Comment: retired  Tobacco Use   Smoking status: Never   Smokeless tobacco: Former    Types: Chew    Quit date: 05/21/2021  Vaping Use   Vaping status: Never Used  Substance and Sexual Activity   Alcohol use: No   Drug use: No   Sexual activity: Not Currently  Other Topics Concern   Not on file  Social History Narrative   Lives with his wife. He has two children. He enjoys being outside and working with wood.    Social Drivers of Corporate investment banker Strain: Low Risk  (03/10/2024)   Overall Financial Resource Strain (CARDIA)    Difficulty of Paying Living Expenses: Not hard at all  Food Insecurity: No Food Insecurity (03/10/2024)   Hunger Vital Sign    Worried About Running Out of Food in the Last Year: Never true    Ran Out of Food in the Last Year: Never true  Transportation Needs: No Transportation Needs (03/10/2024)   PRAPARE - Administrator, Civil Service (Medical): No    Lack of Transportation (Non-Medical): No  Physical Activity: Sufficiently Active (03/10/2024)   Exercise Vital Sign    Days of Exercise per Week: 7 days    Minutes of Exercise per  Session: 30 min  Stress: No Stress Concern Present (03/10/2024)   [employer] of Occupational Health - Occupational Stress Questionnaire    Feeling of Stress : Not at all  Social Connections: Socially Integrated (03/10/2024)   Social Connection and Isolation Panel [NHANES]    Frequency of Communication with Friends and Family: More than three times a week    Frequency of Social Gatherings with Friends and Family: Three times a week    Attends Religious Services: More than 4 times per year    Active Member  of Clubs or Organizations: Yes    Attends Engineer, structural: More than 4 times per year    Marital Status: Married    Tobacco Counseling Counseling given: Not Answered   Clinical Intake:  Pre-visit preparation completed: Yes  Pain : No/denies pain     BMI - recorded: 39.67 Nutritional Status: BMI > 30  Obese Nutritional Risks: None Diabetes: No  How often do you need to have someone help you when you read instructions, pamphlets, or other written materials from your doctor or pharmacy?: 1 - Never What is the last grade level you completed in school?: 14  Interpreter Needed?: No      Activities of Daily Living    03/10/2024    8:12 AM  In your present state of health, do you have any difficulty performing the following activities:  Hearing? 0  Vision? 0  Difficulty concentrating or making decisions? 0  Walking or climbing stairs? 0  Dressing or bathing? 0  Doing errands, shopping? 0  Preparing Food and eating ? N  Using the Toilet? N  In the past six months, have you accidently leaked urine? N  Do you have problems with loss of bowel control? N  Managing your Medications? N  Managing your Finances? N  Housekeeping or managing your Housekeeping? N    Patient Care Team: Agapito Games, MD as PCP - General (Family Medicine) Rollene Rotunda, MD as PCP - Cardiology (Cardiology) Gabriel Carina, [facility] (Inactive) as Pharmacist  (Pharmacist)  Indicate any recent Medical Services you may have received from other than [facility] providers in the past year (date may be approximate).     Assessment:   This is a routine wellness examination for Daeshawn.  Hearing/Vision screen No results found.   Goals Addressed             This Visit's Progress    Weight (lb) < 270 lb (122.5 kg)   309 lb (140.2 kg)    He would like to loss weight.       Depression Screen    03/10/2024    8:18 AM 05/09/2023    8:51 AM 03/20/2023   10:07 AM 02/22/2023    8:04 AM 02/06/2023    8:47 AM 12/06/2022    8:11 AM 12/05/2021   10:23 AM  PHQ 2/9 Scores  PHQ - 2 Score 0 0 0 0 0 0 0    Fall Risk    03/10/2024    8:20 AM 05/09/2023    8:51 AM 03/20/2023   10:07 AM 02/22/2023    8:04 AM 02/06/2023    8:47 AM  Fall Risk   Falls in the past year? 0 0 0 0 0  Number falls in past yr: 0 0 0 0 0  Injury with Fall? 0 0 0 0 0  Risk for fall due to : No Fall Risks No Fall Risks No Fall Risks No Fall Risks No Fall Risks  Follow up Falls evaluation completed Falls evaluation completed  Falls evaluation completed Falls evaluation completed    MEDICARE RISK AT HOME: Medicare Risk at Home Any stairs in or around the home?: No Home free of loose throw rugs in walkways, pet beds, electrical cords, etc?: Yes Adequate lighting in your home to reduce risk of falls?: Yes Life alert?: No Use of a cane, walker or w/c?: No Grab bars in the bathroom?: Yes Shower chair or bench in shower?: Yes Elevated toilet seat or a handicapped toilet?: No  TIMED UP AND GO:  Was the test performed?  Yes  Length of time to ambulate 10 feet: 10 sec Gait slow and steady with assistive device    Cognitive Function:        03/10/2024    8:20 AM 02/22/2023    8:09 AM 09/18/2021    9:10 AM 06/23/2020    9:44 AM 07/15/2019    8:16 AM  6CIT Screen  What Year? 0 points 0 points 0 points 0 points 0 points  What month? 0 points 0 points 0 points 0 points 0 points  What time? 0  points 0 points 0 points 0 points 0 points  Count back from 20 0 points 0 points 0 points 0 points 0 points  Months in reverse 0 points 0 points 0 points 0 points 0 points  Repeat phrase 0 points 0 points 0 points 0 points 0 points  Total Score 0 points 0 points 0 points 0 points 0 points    Immunizations Immunization History  Administered Date(s) Administered   Fluad Quad(high Dose 65+) 08/24/2020, 09/08/2021, 08/13/2022   Fluad Trivalent(High Dose 65+) 07/24/2023   Influenza Split 08/21/2012   Influenza Whole 08/27/2008, 11/07/2009, 10/18/2010, 08/20/2011   Influenza, High Dose Seasonal PF 08/26/2017, 08/08/2018, 08/03/2019   Influenza,inj,Quad PF,6+ Mos 08/25/2013, 08/05/2014, 08/31/2015, 08/28/2016   PFIZER(Purple Top)SARS-COV-2 Vaccination 02/12/2020, 03/05/2020   PNEUMOCOCCAL CONJUGATE-20 03/06/2023   Pfizer(Comirnaty)Fall Seasonal Vaccine 12 years and older 09/20/2022, 03/06/2023   Pneumococcal Conjugate-13 09/29/2013   Pneumococcal Polysaccharide-23 07/14/2015   Rsv, Bivalent, Protein Subunit Rsvpref,pf Verdis Frederickson) 12/10/2022   Td 12/03/1994, 06/15/2009   Zoster Recombinant(Shingrix) 06/07/2018, 08/21/2018   Zoster, Live 06/18/2011    TDAP status: Up to date  Flu Vaccine status: Up to date  Pneumococcal vaccine status: Up to date  Covid-19 vaccine status: Information provided on how to obtain vaccines.   Qualifies for Shingles Vaccine? Yes   Zostavax completed Yes   Shingrix Completed?: Yes  Screening Tests Health Maintenance  Topic Date Due   FOOT EXAM  Never done   COVID-19 Vaccine (5 - 2024-25 season) 08/04/2023   Diabetic kidney evaluation - eGFR measurement  03/19/2024   Diabetic kidney evaluation - Urine ACR  05/08/2024   HEMOGLOBIN A1C  06/23/2024   OPHTHALMOLOGY EXAM  06/26/2024   INFLUENZA VACCINE  07/03/2024   Medicare Annual Wellness (AWV)  03/10/2025   Colonoscopy  07/16/2026   Pneumonia Vaccine 28+ Years old  Completed   Hepatitis C Screening   Completed   Zoster Vaccines- Shingrix  Completed   HPV VACCINES  Aged Out   DTaP/Tdap/Td  Discontinued    Health Maintenance  Health Maintenance Due  Topic Date Due   FOOT EXAM  Never done   COVID-19 Vaccine (5 - 2024-25 season) 08/04/2023   Diabetic kidney evaluation - eGFR measurement  03/19/2024    Colorectal cancer screening: Type of screening: Colonoscopy. Completed 07/17/2023. Repeat every 3 years  Lung Cancer Screening: (Low Dose CT Chest recommended if Age 63-80 years, 20 pack-year currently smoking OR have quit w/in 15years.) does not qualify.   Lung Cancer Screening Referral: n/a  Additional Screening:  Hepatitis C Screening: does qualify; Completed 07/01/2015  Vision Screening: Recommended annual ophthalmology exams for early detection of glaucoma and other disorders of the eye. Is the patient up to date with their annual eye exam?  No  Who is the provider or what is the name of the office in which the patient attends annual eye exams? Provider left practice If  pt is not established with a provider, would they like to be referred to a provider to establish care? No .   Dental Screening: Recommended annual dental exams for proper oral hygiene   Community Resource Referral / Chronic Care Management: CRR required this visit?  No   CCM required this visit?  No     Plan:     I have personally reviewed and noted the following in the patient's chart:   Medical and social history Use of alcohol, tobacco or illicit drugs  Current medications and supplements including opioid prescriptions. Patient is currently taking opioid prescriptions. Information provided to patient regarding non-opioid alternatives. Patient advised to discuss non-opioid treatment plan with their provider. Functional ability and status Nutritional status Physical activity Advanced directives List of other physicians Hospitalizations, surgeries, and ER visits in previous 12 months.  None Vitals Screenings to include cognitive, depression, and falls Referrals and appointments  In addition, I have reviewed and discussed with patient certain preventive protocols, quality metrics, and best practice recommendations. A written personalized care plan for preventive services as well as general preventive health recommendations were provided to patient.     Esmond Harps, CMA   03/10/2024   After Visit Summary: (In Person-Declined) Patient declined AVS at this time.  Nurse Notes:   THAI BURGUENO is a 73 y.o. male patient of Metheney, Barbarann Ehlers, MD who had a Medicare Annual Wellness Visit today. Harm is Retired and lives with their spouse. He has 2 children. he reports that he is socially active and does interact with friends/family regularly. He is minimally physically active and enjoys being outside.

## 2024-03-12 ENCOUNTER — Ambulatory Visit

## 2024-03-16 ENCOUNTER — Other Ambulatory Visit: Payer: Self-pay | Admitting: Family Medicine

## 2024-03-27 NOTE — Telephone Encounter (Signed)
 Patient was notified on 01/22/24 that insurance denied the coverage for the Zepbound rx.

## 2024-03-31 ENCOUNTER — Ambulatory Visit (INDEPENDENT_AMBULATORY_CARE_PROVIDER_SITE_OTHER): Admitting: Family Medicine

## 2024-03-31 VITALS — BP 135/60 | HR 53 | Temp 97.9°F | Resp 97 | Ht 74.0 in | Wt 311.5 lb

## 2024-03-31 DIAGNOSIS — I482 Chronic atrial fibrillation, unspecified: Secondary | ICD-10-CM | POA: Diagnosis not present

## 2024-03-31 LAB — POCT INR: INR: 3.2 — AB (ref 2.0–3.0)

## 2024-03-31 NOTE — Progress Notes (Signed)
 Agree with documentation as above.   Nani Gasser, MD

## 2024-03-31 NOTE — Progress Notes (Signed)
 Patient reports taking warfarin 5 mg, Mon/Tues/Wed/Thur/Fri and 2.5 mg, Sat/Sun. Denies any changes with diet.

## 2024-04-21 ENCOUNTER — Ambulatory Visit: Payer: Self-pay | Admitting: *Deleted

## 2024-04-21 NOTE — Telephone Encounter (Signed)
 Copied from CRM [id number]. 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 LOCATION: "Where is the swelling located?"  (e.g., left, right, both knees)     I get infected.    They usually wrap it.    I asked what gets infected.  He said both of his legs.   He is coughing a lot during the call.   When I cough the top of my head feels like it's coming off.  2013 I had MRSA in my legs real bad.   I think I have it again.  2. ONSET: "When did the swelling start?" "Does it come and go, or is it there all the time?"     Started a month ago.  From my ankle up to my calf on both legs is red and scabbed over.   They are draining too.    3. SWELLING: "How bad is the swelling?" Or, "How large is it?" (e.g., mild, moderate, severe; size of localized swelling)    - NONE: No joint swelling.   - LOCALIZED: Localized; small area of puffy or swollen skin (e.g., insect bite, skin irritation).   - MILD: Joint looks or feels mildly swollen or puffy.   - MODERATE: Swollen; interferes with normal activities (e.g., work or school); can't move joint normally (bend and straighten completely); may be limping.   - SEVERE: Very swollen; can't move swollen joint at all; limping a lot or unable to walk.     Yes both legs 4. PAIN: "Is there any pain?" If Yes, ask: "How bad is it?" (Scale 1-10; or mild, moderate, severe)   - NONE (0): no pain.   - MILD (1-3): doesn't interfere with normal activities.    - MODERATE (4-7): interferes with normal activities (e.g., work or school) or awakens from sleep, limping.    - SEVERE (8-10): excruciating pain, unable to do any normal activities, unable to walk.      No pain at all. 5.  SETTING: "Has there been any recent work, exercise or other activity that involved that part of the body?"      No   6. AGGRAVATING FACTORS: "What makes the knee swelling worse?" (e.g., walking, climbing stairs, running)     I had MRSA 7. ASSOCIATED SYMPTOMS: "Is there any pain or redness?"     No pain but they are swollen 8. OTHER SYMPTOMS: "Do you have any other symptoms?" (e.g., chest pain, difficulty breathing, fever, calf pain)     Coughing a lot during the call.   Non productive cough.   I have COPD real bad.   I can't cough anything up.    I feel like it's stuck in my chest. 9. PREGNANCY: "Is there any chance you are pregnant?" "When was your last menstrual period?"     N/A  Protocols used: Knee Swelling-A-AH  Chief Complaint: Bilateral leg redness and swelling that started a month ago.  Symptoms: Redness and swelling from ankles to calf on both legs.   Had MRSA in 2013 and this looks like it is coming back.  They are red and scabbed over with drainage.   He has bad COPD and has a  non productive cough he wants to have evaluated too. Frequency: For the last month legs are swollen and red Pertinent Negatives: Patient denies pain in his legs. Disposition: [] ED /[] Urgent Care (no appt availability in office) / [x] Appointment(In office/virtual)/ []  [facility]/ [] Home Care/ [] Refused Recommended Disposition /[] [facility]/ []  Follow-up with PCP Additional Notes: Appt made with Dr. Greer Leak at pt's request for 04/22/2024 at 10:10.   He could not make it in today even though there were appts available.

## 2024-04-22 ENCOUNTER — Encounter: Payer: Self-pay | Admitting: Family Medicine

## 2024-04-22 ENCOUNTER — Ambulatory Visit (INDEPENDENT_AMBULATORY_CARE_PROVIDER_SITE_OTHER): Admitting: Family Medicine

## 2024-04-22 VITALS — BP 102/56 | HR 59 | Temp 97.9°F | Ht 74.0 in | Wt 312.0 lb

## 2024-04-22 DIAGNOSIS — I482 Chronic atrial fibrillation, unspecified: Secondary | ICD-10-CM | POA: Diagnosis not present

## 2024-04-22 DIAGNOSIS — L97811 Non-pressure chronic ulcer of other part of right lower leg limited to breakdown of skin: Secondary | ICD-10-CM | POA: Diagnosis not present

## 2024-04-22 DIAGNOSIS — I872 Venous insufficiency (chronic) (peripheral): Secondary | ICD-10-CM | POA: Diagnosis not present

## 2024-04-22 DIAGNOSIS — J449 Chronic obstructive pulmonary disease, unspecified: Secondary | ICD-10-CM | POA: Diagnosis not present

## 2024-04-22 LAB — POCT INR: POC INR: 3.5

## 2024-04-22 MED ORDER — PREDNISONE 20 MG PO TABS: 40.0000 mg | ORAL_TABLET | Freq: Every day | ORAL | 0 refills | Status: DC

## 2024-04-22 MED ORDER — DOXYCYCLINE HYCLATE 100 MG PO TABS: 100.0000 mg | ORAL_TABLET | Freq: Two times a day (BID) | ORAL | 0 refills | Status: DC

## 2024-04-22 MED ORDER — IPRATROPIUM-ALBUTEROL 0.5-2.5 (3) MG/3ML IN SOLN
3.0000 mL | Freq: Once | RESPIRATORY_TRACT | Status: AC
  Administered 2024-04-22: 3 mL via RESPIRATORY_TRACT

## 2024-04-22 MED ORDER — FUROSEMIDE 10 MG/ML IJ SOLN
80.0000 mg | Freq: Once | INTRAMUSCULAR | Status: AC
  Administered 2024-04-22: 80 mg via INTRAMUSCULAR

## 2024-04-22 NOTE — Assessment & Plan Note (Signed)
 Increased shortness of breath and cough.  Possible COPD exacerbation he has not been able to access his inhalers to see if it is actually helpful or not so we did give him a DuoNeb treatment here today.  We did have enough sample of Trelegy for him to get back on for at least 1 month until we can figure out what the cost issue is with his inhalers.  Also consider that he is volume overloaded and so that could actually be contributing to some of the shortness of breath as well.  But I am going to go ahead and treat with doxycycline  and prednisone .

## 2024-04-22 NOTE — Progress Notes (Signed)
 Established Patient Office Visit  Subjective  Patient ID: Jerome Hicks, male    DOB: 26-Aug-1951  Age: 73 y.o. MRN: [id number]  Chief Complaint  Patient presents with   Edema   Cough   Coagulation Disorder    HPI  Here today for an acute visit for increased lower extremity swelling and redness of his legs.  He has had a history of volume overload that tends to trigger very similar symptoms in the past.  He also has a history of MRSA.  He says he started doubling up on his furosemide  about 2 weeks ago he does not weigh himself regularly.  He says he said more swelling over the last month.  His weight is up from baseline.  He also says he has had a cough and sinus congestion for the last week but has not been using his inhaler because he could not afford it.  It is nonproductive.  He says that when he coughs he feels like his head is going to explode he gets a lot of pressure particularly in the back of his head from his neck to the crown.  He says that been going on for about a week as well.    ROS    Objective:     BP (!) 102/56   Pulse (!) 59   Temp 97.9 F (36.6 C) (Oral)   Ht 6\' 2"  (1.88 m)   Wt (!) 312 lb 0.6 oz (141.5 kg)   SpO2 98%   BMI 40.06 kg/m    Physical Exam Constitutional:      Appearance: Normal appearance.  HENT:     Head: Normocephalic and atraumatic.     Right Ear: Tympanic membrane, ear canal and external ear normal. There is no impacted cerumen.     Left Ear: Tympanic membrane, ear canal and external ear normal. There is no impacted cerumen.     Nose: Nose normal.     Mouth/Throat:     Pharynx: Oropharynx is clear.  Eyes:     Conjunctiva/sclera: Conjunctivae normal.  Cardiovascular:     Rate and Rhythm: Normal rate and regular rhythm.  Pulmonary:     Effort: Pulmonary effort is normal.     Breath sounds: Normal breath sounds.  Musculoskeletal:     Cervical back: Neck supple. No tenderness.  Lymphadenopathy:     Cervical: No cervical  adenopathy.  Skin:    General: Skin is warm and dry.  Neurological:     Mental Status: He is alert and oriented to person, place, and time.  Psychiatric:        Mood and Affect: Mood normal.      Results for orders placed or performed in visit on 04/22/24  POCT INR  Result Value Ref Range   INR     POC INR 3.5       The ASCVD Risk score (Arnett DK, et al., 2019) failed to calculate for the following reasons:   The valid total cholesterol range is 130 to 320 mg/dL    Assessment & Plan:   Problem List Items Addressed This Visit       Cardiovascular and Mediastinum   Atrial fibrillation (HCC)   Inr too high target today. Inc to 2.5mg  3 days per week and 5 mg the other 4 days.  Recommend recheck in 2 weeks.       Relevant Orders   POCT INR (Completed)     Respiratory   COPD (chronic obstructive pulmonary  disease) (HCC) - Primary   Increased shortness of breath and cough.  Possible COPD exacerbation he has not been able to access his inhalers to see if it is actually helpful or not so we did give him a DuoNeb treatment here today.  We did have enough sample of Trelegy for him to get back on for at least 1 month until we can figure out what the cost issue is with his inhalers.  Also consider that he is volume overloaded and so that could actually be contributing to some of the shortness of breath as well.  But I am going to go ahead and treat with doxycycline  and prednisone .      Relevant Medications   predniSONE  (DELTASONE ) 20 MG tablet     Musculoskeletal and Integument   Venous stasis ulcer of other part of right lower leg limited to breakdown of skin without varicose veins (HCC)   Does have some leaking through the skin and some erythematous areas on his lower legs in addition to some pitting edema.  Will go ahead and treat with IM Lasix  80 mg here in the office and have him continue to double up on his Lasix  I want him to weigh himself daily.       Follow Up on  Monday for Unna boot's.- change change them sooner if needed.   Return in about 2 weeks (around 05/06/2024) for INR check.    Duaine German, MD

## 2024-04-22 NOTE — Patient Instructions (Addendum)
 Please weigh yourself daily on your home scale starting with today.  Come back on Monday for Nurse visit to have legs checked.

## 2024-04-22 NOTE — Assessment & Plan Note (Addendum)
 Inr too high target today. Inc to 2.5mg  3 days per week and 5 mg the other 4 days.  Recommend recheck in 2 weeks.

## 2024-04-22 NOTE — Assessment & Plan Note (Signed)
 Does have some leaking through the skin and some erythematous areas on his lower legs in addition to some pitting edema.  Will go ahead and treat with IM Lasix  80 mg here in the office and have him continue to double up on his Lasix  I want him to weigh himself daily.

## 2024-04-28 ENCOUNTER — Ambulatory Visit (INDEPENDENT_AMBULATORY_CARE_PROVIDER_SITE_OTHER)

## 2024-04-28 ENCOUNTER — Ambulatory Visit

## 2024-04-28 VITALS — BP 107/71 | HR 68 | Temp 98.6°F | Ht 74.0 in | Wt 317.0 lb

## 2024-04-28 DIAGNOSIS — L03115 Cellulitis of right lower limb: Secondary | ICD-10-CM

## 2024-04-28 DIAGNOSIS — I872 Venous insufficiency (chronic) (peripheral): Secondary | ICD-10-CM

## 2024-04-28 DIAGNOSIS — L97811 Non-pressure chronic ulcer of other part of right lower leg limited to breakdown of skin: Secondary | ICD-10-CM

## 2024-04-28 NOTE — Progress Notes (Signed)
 Patient present here in office to get bilateral unna boots and foot lavage and skin integrity check, pt states he was seen in 2 weeks ago for increased lower extremity swelling and redness of his legs. he started doubling up on his furosemide  about 2 weeks ago, he also states he was  placed on antibiotic and has since finished his course of antibiotics and wanted know if he needed another round of antibiotics or should he remain off of  them, he says his legs are still red and swollen but not as much before.pt tolerated  foot lavage with Hibiclens  and saline solution  and unna boot wrap well. Pcp was present during skin integrity check.

## 2024-05-06 ENCOUNTER — Ambulatory Visit (INDEPENDENT_AMBULATORY_CARE_PROVIDER_SITE_OTHER)

## 2024-05-06 VITALS — BP 121/70 | HR 61 | Ht 74.0 in | Wt 317.0 lb

## 2024-05-06 DIAGNOSIS — I4891 Unspecified atrial fibrillation: Secondary | ICD-10-CM | POA: Diagnosis not present

## 2024-05-06 DIAGNOSIS — I872 Venous insufficiency (chronic) (peripheral): Secondary | ICD-10-CM

## 2024-05-06 DIAGNOSIS — E8770 Fluid overload, unspecified: Secondary | ICD-10-CM | POA: Diagnosis not present

## 2024-05-06 NOTE — Progress Notes (Unsigned)
 Unable to complete POCT INR due to testing strips expiration. Ordered lab draw for INR.  Advised patient he will receive a call concerning results and any changes to treatment. Pt expressed understanding, PT would like any pertinent information to be left on his voicemail in the event he's unable to answer.

## 2024-05-07 LAB — PROTIME-INR
INR: 4.6 — ABNORMAL HIGH (ref 0.9–1.2)
Prothrombin Time: 46.2 s — ABNORMAL HIGH (ref 9.1–12.0)

## 2024-05-08 ENCOUNTER — Ambulatory Visit: Payer: Self-pay | Admitting: Family Medicine

## 2024-05-08 NOTE — Progress Notes (Signed)
 INR to high.  Skip dose today and go back to 2.5mg  4 days and 5mg  3 days per week.

## 2024-05-11 ENCOUNTER — Other Ambulatory Visit: Payer: Self-pay | Admitting: Family Medicine

## 2024-05-11 MED ORDER — TRAMADOL HCL 50 MG PO TABS: 50.0000 mg | ORAL_TABLET | Freq: Three times a day (TID) | ORAL | 1 refills | Status: DC | PRN

## 2024-05-11 NOTE — Telephone Encounter (Signed)
 Copied from CRM [id number]. Topic: Clinical - Medication Refill >> May 11, 2024  9:57 AM Bridgette M wrote: Medication:  traMADol  (ULTRAM ) 50 MG tablet   Has only 2 days left of the medication  Has the patient contacted their pharmacy? Yes (Agent: If no, request that the patient contact the pharmacy for the refill. If patient does not wish to contact the pharmacy document the reason why and proceed with request.) (Agent: If yes, when and what did the pharmacy advise?)  This is the patient's preferred pharmacy:  [facility] [address], [place] - [address] [place] [address] Phone: [phone] Fax: [phone]  Is this the correct pharmacy for this prescription? Yes If no, delete pharmacy and type the correct one.   Has the prescription been filled recently? No  Is the patient out of the medication? No  Has the patient been seen for an appointment in the last year OR does the patient have an upcoming appointment? Yes  Can we respond through MyChart? Yes  Agent: Please be advised that Rx refills may take up to 3 business days. We ask that you follow-up with your pharmacy.

## 2024-05-11 NOTE — Telephone Encounter (Signed)
 Requesting rx rf of tramadol  50mg   Last written 03/10/2024 Last OV 04/22/2024 Upcoming appt = 06/11/2024

## 2024-05-20 ENCOUNTER — Ambulatory Visit (INDEPENDENT_AMBULATORY_CARE_PROVIDER_SITE_OTHER): Admitting: Family Medicine

## 2024-05-20 VITALS — BP 157/80 | HR 65 | Ht 74.0 in

## 2024-05-20 DIAGNOSIS — I4821 Permanent atrial fibrillation: Secondary | ICD-10-CM | POA: Diagnosis not present

## 2024-05-20 LAB — POCT INR: INR: 4.5 — AB (ref 2.0–3.0)

## 2024-05-20 NOTE — Progress Notes (Signed)
   Established Patient Office Visit  Subjective   Patient ID: KINNETH FUJIWARA, male    DOB: 06/29/1951  Age: 73 y.o. MRN: [id number]  No chief complaint on file.   HPI  INR check - nurse visit. Patietn denies any unusual bleeding ,bruising or bleeding of gums with brushing of teeth. He denies any alcohol use. He denies any extra doses of medication taken or missed.   ROS    Objective:     BP (!) 157/80   Pulse 65   Ht 6' 2 (1.88 m)   SpO2 98%   BMI 40.70 kg/m    Physical Exam   Results for orders placed or performed in visit on 05/20/24  POCT INR  Result Value Ref Range   INR 4.5 (A) 2.0 - 3.0      The ASCVD Risk score (Arnett DK, et al., 2019) failed to calculate for the following reasons:   The valid total cholesterol range is 130 to 320 mg/dL    Assessment & Plan:  Per Dr. Greer Leak  due to elevated INR. Medication changes of coumadin  -patient should take 2.5mg   5 days a week and 5mg  2 days per week. Return in 10 days for next INR check as nurse visit. (Patient scheduled for June 01, 2024)  Problem List Items Addressed This Visit       Cardiovascular and Mediastinum   Atrial fibrillation (HCC) - Primary    No follow-ups on file.    Duaine German, MD

## 2024-05-21 ENCOUNTER — Ambulatory Visit (INDEPENDENT_AMBULATORY_CARE_PROVIDER_SITE_OTHER)

## 2024-05-21 VITALS — BP 128/75 | HR 60 | Ht 74.0 in | Wt 312.0 lb

## 2024-05-21 DIAGNOSIS — L03115 Cellulitis of right lower limb: Secondary | ICD-10-CM

## 2024-05-21 NOTE — Patient Instructions (Signed)
 Return in 1 week for skin check

## 2024-05-21 NOTE — Progress Notes (Unsigned)
 Patient present here in office to get bilateral unna boots and foot lavage and skin integrity check, pt states he was seen in 2 weeks ago for increased lower extremity swelling and redness of his legs.pt tolerated  foot lavage with Hibiclens  and saline solution  and unna boot wrap well.

## 2024-06-01 ENCOUNTER — Ambulatory Visit (INDEPENDENT_AMBULATORY_CARE_PROVIDER_SITE_OTHER): Admitting: Family Medicine

## 2024-06-01 ENCOUNTER — Ambulatory Visit: Payer: Self-pay | Admitting: Family Medicine

## 2024-06-01 VITALS — BP 128/64 | HR 69 | Wt 311.0 lb

## 2024-06-01 DIAGNOSIS — I4891 Unspecified atrial fibrillation: Secondary | ICD-10-CM | POA: Diagnosis not present

## 2024-06-01 DIAGNOSIS — I4821 Permanent atrial fibrillation: Secondary | ICD-10-CM

## 2024-06-01 LAB — POCT INR: INR: 3.3 — AB (ref 2.0–3.0)

## 2024-06-01 NOTE — Progress Notes (Signed)
 Jerome Hicks

## 2024-06-01 NOTE — Progress Notes (Signed)
 Take 2.5 mg 6 days per week, and 5mg  1 days. Recheck in 10 days

## 2024-06-01 NOTE — Progress Notes (Signed)
 Patient advised.

## 2024-06-03 ENCOUNTER — Ambulatory Visit (INDEPENDENT_AMBULATORY_CARE_PROVIDER_SITE_OTHER)

## 2024-06-03 VITALS — BP 140/82 | HR 59 | Ht 74.0 in | Wt 308.1 lb

## 2024-06-03 DIAGNOSIS — R600 Localized edema: Secondary | ICD-10-CM | POA: Diagnosis not present

## 2024-06-03 DIAGNOSIS — I872 Venous insufficiency (chronic) (peripheral): Secondary | ICD-10-CM | POA: Diagnosis not present

## 2024-06-03 NOTE — Addendum Note (Signed)
 Addended by: Tenille Morrill D on: 06/03/2024 01:16 PM   Modules accepted: Level of Service

## 2024-06-03 NOTE — Progress Notes (Signed)
   Established Patient Office Visit  Subjective   Patient ID: Jerome Hicks, male    DOB: 1951-10-29  Age: 73 y.o. MRN: [id number]  Chief Complaint  Patient presents with   bilateral lower leg edema with weeping     Unna boot placement bilaterally     HPI  Bilateral lower leg edema with weeping posterior calf. Patient states he has lost 4 pounds since his last visit with our office.   ROS    Objective:     BP (!) 140/82   Pulse (!) 59   Ht 6' 2 (1.88 m)   Wt (!) 308 lb 1 oz (139.7 kg)   SpO2 96%   BMI 39.55 kg/m    Physical Exam   No results found for any visits on 06/03/24.    The ASCVD Risk score (Arnett DK, et al., 2019) failed to calculate for the following reasons:   The valid total cholesterol range is 130 to 320 mg/dL    Assessment & Plan:  Unna boot placement bilaterally.  Problem List Items Addressed This Visit       Cardiovascular and Mediastinum   Venous (peripheral) insufficiency - Primary   Relevant Orders   Apply unna boot     Other   Lower leg edema   Relevant Orders   Apply unna boot    Return if symptoms worsen or fail to improve.    Suzen SHAUNNA Plenty, LPN

## 2024-06-08 ENCOUNTER — Other Ambulatory Visit: Payer: Self-pay | Admitting: Family Medicine

## 2024-06-08 DIAGNOSIS — I10 Essential (primary) hypertension: Secondary | ICD-10-CM

## 2024-06-09 ENCOUNTER — Other Ambulatory Visit: Payer: Self-pay | Admitting: Family Medicine

## 2024-06-09 NOTE — Telephone Encounter (Signed)
 Copied from CRM [id number]. Topic: Clinical - Medication Refill >> Jun 09, 2024  4:44 PM Adrianna P wrote: Medication: clotrimazole -betamethasone  (LOTRISONE ) cream  Has the patient contacted their pharmacy? Yes (Agent: If no, request that the patient contact the pharmacy for the refill. If patient does not wish to contact the pharmacy document the reason why and proceed with request.) (Agent: If yes, when and what did the pharmacy advise?)  This is the patient's preferred pharmacy:  [facility] [address] [place] - [address] [place] [address] Phone: [phone] Fax: [phone]  [facility] - [place], [place] - [address] [place] [address] Phone: [phone] Fax: [phone]  Is this the correct pharmacy for this prescription? Yes If no, delete pharmacy and type the correct one.   Has the prescription been filled recently? No  Is the patient out of the medication? Yes  Has the patient been seen for an appointment in the last year OR does the patient have an upcoming appointment? Yes  Can we respond through MyChart? Yes  Agent: Please be advised that Rx refills may take up to 3 business days. We ask that you follow-up with your pharmacy.

## 2024-06-10 ENCOUNTER — Other Ambulatory Visit: Payer: Self-pay | Admitting: Family Medicine

## 2024-06-10 NOTE — Telephone Encounter (Signed)
 Copied from CRM [id number]. Topic: Clinical - Medication Refill >> Jun 10, 2024  2:31 PM Adrianna P wrote: Medication: traMADol  (ULTRAM ) 50 MG tablet mupirocin  ointment (BACTROBAN ) 2 %  Has the patient contacted their pharmacy? Yes (Agent: If no, request that the patient contact the pharmacy for the refill. If patient does not wish to contact the pharmacy document the reason why and proceed with request.) (Agent: If yes, when and what did the pharmacy advise?)  This is the patient's preferred pharmacy:  [facility] [address], [place] - [address] [place] [address] Phone: [phone] Fax: [phone]  [facility] - [place], [place] - [address] [place] [address] Phone: [phone] Fax: [phone]  Is this the correct pharmacy for this prescription? Yes If no, delete pharmacy and type the correct one.   Has the prescription been filled recently? No  Is the patient out of the medication? Yes  Has the patient been seen for an appointment in the last year OR does the patient have an upcoming appointment? Yes  Can we respond through MyChart? Yes  Agent: Please be advised that Rx refills may take up to 3 business days. We ask that you follow-up with your pharmacy.

## 2024-06-10 NOTE — Progress Notes (Unsigned)
 Complete physical exam  Patient: Jerome Hicks   DOB: February 21, 1951   73 y.o. Male  MRN: [id number]  Subjective:    No chief complaint on file.   Jerome Hicks is a 73 y.o. male who presents today for a complete physical exam. He reports consuming a general diet. {types:19826} He generally feels {DESC; WELL/FAIRLY WELL/POORLY:18703}. He reports sleeping {DESC; WELL/FAIRLY WELL/POORLY:18703}. He {does/does not:200015} have additional problems to discuss today.    Most recent fall risk assessment:    03/10/2024    8:20 AM  Fall Risk   Falls in the past year? 0  Number falls in past yr: 0  Injury with Fall? 0  Risk for fall due to : No Fall Risks  Follow up Falls evaluation completed     Most recent depression screenings:    03/10/2024    8:18 AM 05/09/2023    8:51 AM  PHQ 2/9 Scores  PHQ - 2 Score 0 0    {VISON DENTAL STD PSA (Optional):27386}  {History (Optional):23778}  Patient Care Team: Alvan Dorothyann BIRCH, MD as PCP - General (Family Medicine) Lavona Agent, MD as PCP - Cardiology (Cardiology) Beverli Cara PARAS, [facility] (Inactive) as Pharmacist (Pharmacist)   Outpatient Medications Prior to Visit  Medication Sig   albuterol  (VENTOLIN  HFA) 108 (90 Base) MCG/ACT inhaler 2 puffs every 6 (six) hours as needed.   amLODipine  (NORVASC ) 2.5 MG tablet TAKE 1 TABLET EVERY DAY   carvedilol  (COREG ) 25 MG tablet TAKE 1 TABLET TWICE DAILY WITH MEALS   clotrimazole -betamethasone  (LOTRISONE ) cream Apply 1 application topically daily as needed (rash). Dont use for more than 2 weeks at a time   doxazosin  (CARDURA ) 2 MG tablet Take 1 tablet (2 mg total) by mouth daily.   doxycycline  (VIBRA -TABS) 100 MG tablet Take 1 tablet (100 mg total) by mouth 2 (two) times daily.   furosemide  (LASIX ) 40 MG tablet Take 2 tablets (80 mg total) by mouth daily. TAKE 1 TO 2 TABLETS DAILY AS NEEDED FOR EDEMA.   meclizine  (ANTIVERT ) 25 MG tablet TAKE 1 TABLET THREE TIMES DAILY AS NEEDED FOR DIZZINESS OR  NAUSEA AS DIRECTED   Multiple Vitamin (MULTIVITAMIN WITH MINERALS) TABS tablet Take 1 tablet by mouth daily.   mupirocin  ointment (BACTROBAN ) 2 % Apply topically 2 (two) times daily as needed. Don't use for more than 2 weeks at a time.   potassium chloride  (KLOR-CON  M) 10 MEQ tablet TAKE 1 TABLET EVERY DAY   rosuvastatin  (CRESTOR ) 10 MG tablet TAKE 1 TABLET BY MOUTH AT BEDTIME   traMADol  (ULTRAM ) 50 MG tablet Take 1 tablet (50 mg total) by mouth every 8 (eight) hours as needed.   valsartan  (DIOVAN ) 320 MG tablet TAKE 1 TABLET EVERY DAY   warfarin (COUMADIN ) 5 MG tablet TAKE 5MG  EVERY MONDAY AND THURSDAY. TAKE 2.5MG  ALL OTHER DAYS AS DIRECTED   No facility-administered medications prior to visit.    ROS        Objective:     There were no vitals taken for this visit. {Vitals History (Optional):23777}  Physical Exam   No results found for any visits on 06/11/24. {Show previous labs (optional):23779}    Assessment & Plan:    Routine Health Maintenance and Physical Exam  Immunization History  Administered Date(s) Administered   Fluad Quad(high Dose 65+) 08/24/2020, 09/08/2021, 08/13/2022   Fluad Trivalent(High Dose 65+) 07/24/2023   Influenza Split 08/21/2012   Influenza Whole 08/27/2008, 11/07/2009, 10/18/2010, 08/20/2011   Influenza, High Dose Seasonal PF 08/26/2017,  08/08/2018, 08/03/2019   Influenza,inj,Quad PF,6+ Mos 08/25/2013, 08/05/2014, 08/31/2015, 08/28/2016   PFIZER(Purple Top)SARS-COV-2 Vaccination 02/12/2020, 03/05/2020   PNEUMOCOCCAL CONJUGATE-20 03/06/2023   Pfizer(Comirnaty)Fall Seasonal Vaccine 12 years and older 09/20/2022, 03/06/2023   Pneumococcal Conjugate-13 09/29/2013   Pneumococcal Polysaccharide-23 07/14/2015   Rsv, Bivalent, Protein Subunit Rsvpref,pf (Abrysvo) 12/10/2022   Td 12/03/1994, 06/15/2009   Tdap 12/25/2023   Unspecified SARS-COV-2 Vaccination 10/09/2023   Zoster Recombinant(Shingrix) 06/07/2018, 08/21/2018   Zoster, Live 06/18/2011     Health Maintenance  Topic Date Due   COVID-19 Vaccine (6 - 2024-25 season) 04/07/2024   INFLUENZA VACCINE  07/03/2024   Medicare Annual Wellness (AWV)  03/10/2025   Colonoscopy  07/16/2026   Pneumococcal Vaccine: 50+ Years  Completed   Hepatitis C Screening  Completed   Zoster Vaccines- Shingrix  Completed   Hepatitis B Vaccines  Aged Out   HPV VACCINES  Aged Out   Meningococcal B Vaccine  Aged Out   DTaP/Tdap/Td  Discontinued    Discussed health benefits of physical activity, and encouraged him to engage in regular exercise appropriate for his age and condition.  Problem List Items Addressed This Visit   None Visit Diagnoses       Wellness examination    -  Primary      No follow-ups on file.     Dorothyann Byars, MD

## 2024-06-11 ENCOUNTER — Ambulatory Visit (INDEPENDENT_AMBULATORY_CARE_PROVIDER_SITE_OTHER): Admitting: Family Medicine

## 2024-06-11 ENCOUNTER — Encounter: Payer: Self-pay | Admitting: Family Medicine

## 2024-06-11 VITALS — BP 132/76 | HR 62 | Ht 74.0 in | Wt 300.0 lb

## 2024-06-11 DIAGNOSIS — Z0000 Encounter for general adult medical examination without abnormal findings: Secondary | ICD-10-CM | POA: Diagnosis not present

## 2024-06-11 DIAGNOSIS — I4821 Permanent atrial fibrillation: Secondary | ICD-10-CM

## 2024-06-11 DIAGNOSIS — I10 Essential (primary) hypertension: Secondary | ICD-10-CM

## 2024-06-11 LAB — POCT INR: POC INR: 2.3

## 2024-06-11 MED ORDER — CLOTRIMAZOLE-BETAMETHASONE 1-0.05 % EX CREA: 1.0000 | TOPICAL_CREAM | Freq: Every day | CUTANEOUS | 1 refills | Status: AC | PRN

## 2024-06-11 NOTE — Patient Instructions (Addendum)
 I would really like for you to get some compression socks that come just below your knee.  You really need a pressure around 25-30 if at all possible.  If you can wear them in the mornings and then take them off after dinner can make a big difference.  Continue to work on cutting back on portions, eating more vegetables and less carbohydrates.  Work on cutting back on breads rice Posta some potatoes.

## 2024-06-12 ENCOUNTER — Ambulatory Visit: Payer: Self-pay | Admitting: Family Medicine

## 2024-06-12 LAB — CMP14+EGFR
ALT: 24 IU/L (ref 0–44)
AST: 25 IU/L (ref 0–40)
Albumin: 4.1 g/dL (ref 3.8–4.8)
Alkaline Phosphatase: 88 IU/L (ref 44–121)
BUN/Creatinine Ratio: 20 (ref 10–24)
BUN: 21 mg/dL (ref 8–27)
Bilirubin Total: 3.1 mg/dL — ABNORMAL HIGH (ref 0.0–1.2)
CO2: 24 mmol/L (ref 20–29)
Calcium: 9.3 mg/dL (ref 8.6–10.2)
Chloride: 102 mmol/L (ref 96–106)
Creatinine, Ser: 1.06 mg/dL (ref 0.76–1.27)
Globulin, Total: 2.2 g/dL (ref 1.5–4.5)
Glucose: 97 mg/dL (ref 70–99)
Potassium: 4.4 mmol/L (ref 3.5–5.2)
Sodium: 141 mmol/L (ref 134–144)
Total Protein: 6.3 g/dL (ref 6.0–8.5)
eGFR: 74 mL/min/1.73 (ref >59)

## 2024-06-12 LAB — LIPID PANEL
Chol/HDL Ratio: 1.8 ratio (ref 0.0–5.0)
Cholesterol, Total: 90 mg/dL — ABNORMAL LOW (ref 100–199)
HDL: 49 mg/dL (ref >39)
LDL Chol Calc (NIH): 26 mg/dL (ref 0–99)
Triglycerides: 65 mg/dL (ref 0–149)
VLDL Cholesterol Cal: 15 mg/dL (ref 5–40)

## 2024-06-12 MED ORDER — MUPIROCIN 2 % EX OINT: TOPICAL_OINTMENT | Freq: Two times a day (BID) | CUTANEOUS | 1 refills | Status: AC | PRN

## 2024-06-12 NOTE — Telephone Encounter (Signed)
 The pharmacy did fill the Tramadol  3 days ago for patient.

## 2024-06-12 NOTE — Telephone Encounter (Signed)
 If I am looking at it correctly he should have had a refill for July on that last prescription.  Can we verify that?   So going to forward this note to [place].  The E2 C2 agent put into pharmacies which would make it very confusing for where to actually send his medicine.  Needs to be filed as an error

## 2024-06-12 NOTE — Progress Notes (Signed)
 Pj, metabolic panel overall looks good bilirubin is elevated but stable.  LDL cholesterol looks fantastic.  You to work on healthy diet and regular exercise and weight loss.

## 2024-06-13 ENCOUNTER — Other Ambulatory Visit: Payer: Self-pay | Admitting: Family Medicine

## 2024-06-15 NOTE — Telephone Encounter (Signed)
 Error reporting CRM : [id number] created and routed to supervisor pool to review.

## 2024-07-01 ENCOUNTER — Telehealth: Payer: Self-pay | Admitting: Family Medicine

## 2024-07-01 NOTE — Telephone Encounter (Signed)
 Patient dropped off App for Renewal of Disability Placard and report of asbestos-related diseases from [place] office of [employer] both were put in Dr. Vernida box 07/01/24

## 2024-07-01 NOTE — Telephone Encounter (Signed)
 Complete and placed in [place] bakset

## 2024-07-09 ENCOUNTER — Ambulatory Visit: Admitting: Family Medicine

## 2024-07-09 VITALS — BP 139/73 | HR 56 | Ht 74.0 in

## 2024-07-09 DIAGNOSIS — I4821 Permanent atrial fibrillation: Secondary | ICD-10-CM | POA: Diagnosis not present

## 2024-07-09 LAB — POCT INR
INR: 2.5 (ref 2.0–3.0)
POC INR: 2.5

## 2024-07-09 MED ORDER — TRELEGY ELLIPTA 200-62.5-25 MCG/ACT IN AEPB: 1.0000 | INHALATION_SPRAY | Freq: Every day | RESPIRATORY_TRACT | 3 refills | Status: AC

## 2024-07-09 NOTE — Progress Notes (Unsigned)
 Meds ordered this encounter  Medications   Fluticasone-Umeclidin-Vilant (TRELEGY ELLIPTA ) 200-62.5-25 MCG/ACT AEPB    Sig: Inhale 1 puff into the lungs daily.    Dispense:  60 each    Refill:  3

## 2024-07-22 ENCOUNTER — Other Ambulatory Visit: Payer: Self-pay | Admitting: Family Medicine

## 2024-07-22 DIAGNOSIS — I10 Essential (primary) hypertension: Secondary | ICD-10-CM

## 2024-08-04 ENCOUNTER — Encounter: Payer: Self-pay | Admitting: Sports Medicine

## 2024-08-06 ENCOUNTER — Ambulatory Visit (INDEPENDENT_AMBULATORY_CARE_PROVIDER_SITE_OTHER): Admitting: Family Medicine

## 2024-08-06 VITALS — BP 118/64 | HR 62 | Ht 74.0 in

## 2024-08-06 DIAGNOSIS — R600 Localized edema: Secondary | ICD-10-CM | POA: Diagnosis not present

## 2024-08-06 DIAGNOSIS — I4821 Permanent atrial fibrillation: Secondary | ICD-10-CM

## 2024-08-06 DIAGNOSIS — I872 Venous insufficiency (chronic) (peripheral): Secondary | ICD-10-CM | POA: Diagnosis not present

## 2024-08-06 DIAGNOSIS — L97811 Non-pressure chronic ulcer of other part of right lower leg limited to breakdown of skin: Secondary | ICD-10-CM | POA: Diagnosis not present

## 2024-08-06 LAB — POCT INR: INR: 3 (ref 2.0–3.0)

## 2024-08-06 NOTE — Progress Notes (Signed)
.  cmgree  

## 2024-08-06 NOTE — Progress Notes (Signed)
   Established Patient Office Visit  Subjective   Patient ID: Jerome Hicks, male    DOB: [birth year]  Age: 73 y.o. MRN: [id number]  Chief Complaint  Patient presents with   venious stasis ulcer right leg    Venious stasis ulcer right leg and bilateral lower leg edema- unna boot placement bilaterally  - nurse visit    permanent atrial fribrillation    Coumadin  -INR check  nurse visit.     HPI  Patient in office for INR check and has requested Unna Boots be placed bilaterally. Bilateral lower extremity edema, redness and weeping fluid,- right leg also has open wound -with slight bleeding .  Approved for [employer] by Dr. Alvan. Patient informed that in the future an appointment with provider will be needed before these can be placed in office.   ROS    Objective:     BP 118/64   Pulse 62   Ht 6' 2 (1.88 m)   SpO2 97%   BMI 38.52 kg/m    Physical Exam   Results for orders placed or performed in visit on 08/06/24  POCT INR  Result Value Ref Range   INR 3.0 2.0 - 3.0      The ASCVD Risk score (Arnett DK, et al., 2019) failed to calculate for the following reasons:   The valid total cholesterol range is 130 to 320 mg/dL    Assessment & Plan:  INR - check = 3.0. patient will continue current medication regimen and return on one month for INR recheck.  Unna boots placed bilaterally. With xeroform placed at area of open wound on Right leg before unna boot placed. Patient will remove these in 3 days and will schedule an appt with Dr. Alvan if leg is not improved.  Problem List Items Addressed This Visit       Cardiovascular and Mediastinum   Venous (peripheral) insufficiency - Primary   Cardiac arrhythmia     Musculoskeletal and Integument   Venous stasis ulcer of other part of right lower leg limited to breakdown of skin without varicose veins (HCC)     Other   Lower leg edema    Return in about 1 month (around 09/05/2024) for INR check nurse visit .     Suzen SHAUNNA Plenty, LPN

## 2024-08-06 NOTE — Patient Instructions (Addendum)
 Return for INR check in one month as nurse visit  And will call and schedule with provider if Unna boot is needed in future

## 2024-08-10 ENCOUNTER — Ambulatory Visit

## 2024-08-10 ENCOUNTER — Ambulatory Visit (INDEPENDENT_AMBULATORY_CARE_PROVIDER_SITE_OTHER): Admitting: Urgent Care

## 2024-08-10 ENCOUNTER — Encounter: Payer: Self-pay | Admitting: Urgent Care

## 2024-08-10 VITALS — BP 130/79 | HR 105 | Ht 74.0 in | Wt 313.0 lb

## 2024-08-10 DIAGNOSIS — L97811 Non-pressure chronic ulcer of other part of right lower leg limited to breakdown of skin: Secondary | ICD-10-CM

## 2024-08-10 DIAGNOSIS — L03115 Cellulitis of right lower limb: Secondary | ICD-10-CM | POA: Diagnosis not present

## 2024-08-10 DIAGNOSIS — I872 Venous insufficiency (chronic) (peripheral): Secondary | ICD-10-CM

## 2024-08-10 DIAGNOSIS — Z96651 Presence of right artificial knee joint: Secondary | ICD-10-CM | POA: Diagnosis not present

## 2024-08-10 MED ORDER — TRAMADOL HCL 50 MG PO TABS: 50.0000 mg | ORAL_TABLET | Freq: Three times a day (TID) | ORAL | 0 refills | Status: DC | PRN

## 2024-08-10 MED ORDER — CEPHALEXIN 500 MG PO CAPS: 500.0000 mg | ORAL_CAPSULE | Freq: Four times a day (QID) | ORAL | 0 refills | Status: AC

## 2024-08-10 NOTE — Progress Notes (Signed)
 Jerome Hicks

## 2024-08-10 NOTE — Progress Notes (Signed)
 Established Patient Office Visit  Subjective:  Patient ID: Jerome Hicks, male    DOB: 1951-03-11  Age: 73 y.o. MRN: [id number]  Chief Complaint  Patient presents with   Follow-up    Recheck legs    HPI  Pleasant 73yo male presents today for recheck of his legs. Was seen one week ago where [employer] was placed. He wore it for 4 days and then removed it. States the ulceration has closed well, still scabbed in the front shin. He notes weeping to the back of his calf. He does feel like his edema is worsening. He has never been to PT for this. He is also concerned with the warm and redness to this shin - he has had cellulitis in the past and feels it is similar.    Patient Active Problem List   Diagnosis Date Noted   COPD (chronic obstructive pulmonary disease) (HCC) 12/25/2023   Elevated coronary artery calcium  score 06/05/2023   Encounter for weight management 12/06/2022   Encounter for chronic pain management 09/20/2022   BMI 38.0-38.9,adult 09/20/2022   Aortic root enlargement (HCC) 07/24/2022   Aneurysm of ascending aorta without rupture (HCC) 06/09/2022   Coronary artery disease due to calcified coronary lesion 03/10/2019   Total knee replacement status 08/02/2018   Thoracic aortic ectasia (HCC) 02/06/2018   History of arthroplasty of right knee 10/21/2017   Glenohumeral arthritis of both shoulders 04/14/2015   Gilbert's disease 08/24/2014   Traumatic avulsion of nail plate of toe 91/93/7984   Hypogonadism male 02/26/2014   Lower leg edema 12/02/2013   Lymph node enlargement 12/02/2013   Venous stasis ulcer of other part of right lower leg limited to breakdown of skin without varicose veins (HCC) 12/01/2013   CKD (chronic kidney disease) stage 3, GFR 30-59 ml/min (HCC) 05/28/2013   Asbestosis (HCC) 01/12/2011   Restrictive lung disease 01/12/2011   Cardiac arrhythmia 05/26/2008   ERECTILE DYSFUNCTION 01/06/2008   Essential hypertension, benign 01/05/2008   Venous  (peripheral) insufficiency 01/05/2008   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)    get yearly PFT's   Ascending aortic aneurysm (HCC)    seen on 01-31-18 ct chest ; patient denies awareness   Atrial fibrillation (HCC)    Beta-hemolytic group B streptococcal sepsis (HCC) 12/01/2013   Formatting of this note might be different from the original.  Hospitalized from 11-14-2013 through 11-27-2013   Cataracts, bilateral    ED (erectile dysfunction)    has been prescribed Levitra  and Viagrain in the past   Hypertension    Internal hemorrhoids    Kidney stones    Leg edema    recurrent cellulitis   Scrotal abscess 01/2002   Shoulder arthritis    Sigmoid diverticulosis    Past Surgical History:  Procedure Laterality Date    MRSA infection in left groin requiring surgical debridement  12-08   CHOLECYSTECTOMY  1971   kidney stones  1990's   Ureteroscopic stone extraction   TONSILLECTOMY     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   Procedure: RIGHT TOTAL KNEE ARTHROPLASTY;  Surgeon: Yvone Rush, MD;  Location: [facility];  Service: Orthopedics;  Laterality: Right;   Social History   Tobacco Use   Smoking status: Never   Smokeless tobacco: Former    Types: Chew    Quit date: 05/21/2021  Vaping Use   Vaping status: Never Used  Substance Use Topics   Alcohol use: No  Drug use: No      ROS: as noted in HPI  Objective:     BP 130/79   Pulse (!) 105   Ht 6' 2 (1.88 m)   Wt (!) 313 lb (142 kg)   SpO2 93%   BMI 40.19 kg/m  BP Readings from Last 3 Encounters:  08/10/24 130/79  08/06/24 118/64  07/09/24 139/73   Wt Readings from Last 3 Encounters:  08/10/24 (!) 313 lb (142 kg)  06/11/24 (!) 300 lb 0.6 oz (136.1 kg)  06/03/24 (!) 308 lb 1 oz (139.7 kg)      Physical Exam Vitals reviewed.  Constitutional:      General: He is not in acute distress.    Appearance: He is obese.  Cardiovascular:     Rate and Rhythm: Tachycardia  present.  Pulmonary:     Effort: Pulmonary effort is normal. No respiratory distress.  Musculoskeletal:     Right lower leg: Edema present.     Left lower leg: Edema present.  Skin:    General: Skin is warm.     Coloration: Skin is pale.     Findings: Erythema (RLE, see photos) and rash (stasis dermatitis, closed/ scabbed ulceration noted to central shin) present.  Neurological:     Mental Status: He is alert and oriented to person, place, and time.  Psychiatric:        Mood and Affect: Mood normal.        Behavior: Behavior normal.          No results found for any visits on 08/10/24.  Last CBC Lab Results  Component Value Date   WBC 7.4 06/11/2023   HGB 12.3 (L) 06/11/2023   HCT 37.2 (L) 06/11/2023   MCV 94.7 06/11/2023   MCH 31.3 06/11/2023   RDW 11.6 06/11/2023   PLT 196 06/11/2023   Last metabolic panel Lab Results  Component Value Date   GLUCOSE 97 06/11/2024   NA 141 06/11/2024   K 4.4 06/11/2024   CL 102 06/11/2024   CO2 24 06/11/2024   BUN 21 06/11/2024   CREATININE 1.06 06/11/2024   EGFR 74 06/11/2024   CALCIUM  9.3 06/11/2024   PROT 6.3 06/11/2024   ALBUMIN  4.1 06/11/2024   LABGLOB 2.2 06/11/2024   BILITOT 3.1 (H) 06/11/2024   ALKPHOS 88 06/11/2024   AST 25 06/11/2024   ALT 24 06/11/2024   ANIONGAP 6 08/02/2018      The ASCVD Risk score (Arnett DK, et al., 2019) failed to calculate for the following reasons:   The valid total cholesterol range is 130 to 320 mg/dL  Assessment & Plan:  Venous stasis ulcer of other part of right lower leg limited to breakdown of skin without varicose veins (HCC) -     Ambulatory referral to Physical Therapy -     Unna boot  Stasis dermatitis of both legs -     Ambulatory referral to Physical Therapy -     Unna boot  Cellulitis of right lower leg -     Cephalexin ; Take 1 capsule (500 mg total) by mouth 4 (four) times daily for 7 days.  Dispense: 28 capsule; Refill: 0  History of arthroplasty of right  knee -     traMADol  HCl; Take 1 tablet (50 mg total) by mouth every 8 (eight) hours as needed.  Dispense: 90 tablet; Refill: 0  Replaced unna boot today. Instructed pt to wear for 5-7 days. Start cephalexin . Pt with PCN allergy, however has  taken keflex  in the past and tolerated. Will refer to lymphedema clinic for further recs  Pt requesting refill of Tramadol . Last fill >30 days ago. PDMP reviewed.  No follow-ups on file.   Benton LITTIE Gave, PA

## 2024-08-10 NOTE — Patient Instructions (Addendum)
 Unna boot placed on R lower leg. Wear this for the next 5-7 days. Please follow up with lymphedema clinic. Take keflex  four times daily for the next 7 days.  Keep your follow up in office in October.

## 2024-08-26 ENCOUNTER — Other Ambulatory Visit: Payer: Self-pay | Admitting: Family Medicine

## 2024-08-26 DIAGNOSIS — J4540 Moderate persistent asthma, uncomplicated: Secondary | ICD-10-CM | POA: Diagnosis not present

## 2024-08-26 DIAGNOSIS — I482 Chronic atrial fibrillation, unspecified: Secondary | ICD-10-CM

## 2024-08-26 DIAGNOSIS — J986 Disorders of diaphragm: Secondary | ICD-10-CM | POA: Diagnosis not present

## 2024-08-26 DIAGNOSIS — J984 Other disorders of lung: Secondary | ICD-10-CM | POA: Diagnosis not present

## 2024-08-26 DIAGNOSIS — Z6839 Body mass index (BMI) 39.0-39.9, adult: Secondary | ICD-10-CM | POA: Diagnosis not present

## 2024-08-26 DIAGNOSIS — E66812 Obesity, class 2: Secondary | ICD-10-CM | POA: Diagnosis not present

## 2024-08-26 DIAGNOSIS — I10 Essential (primary) hypertension: Secondary | ICD-10-CM

## 2024-09-07 ENCOUNTER — Ambulatory Visit

## 2024-09-08 ENCOUNTER — Ambulatory Visit

## 2024-09-08 VITALS — BP 157/73 | HR 65 | Temp 97.9°F | Resp 18 | Ht 74.0 in

## 2024-09-08 DIAGNOSIS — I482 Chronic atrial fibrillation, unspecified: Secondary | ICD-10-CM | POA: Diagnosis not present

## 2024-09-08 DIAGNOSIS — Z23 Encounter for immunization: Secondary | ICD-10-CM

## 2024-09-08 DIAGNOSIS — I10 Essential (primary) hypertension: Secondary | ICD-10-CM

## 2024-09-08 DIAGNOSIS — Z96651 Presence of right artificial knee joint: Secondary | ICD-10-CM

## 2024-09-08 LAB — POCT INR: INR: 2.7 (ref 2.0–3.0)

## 2024-09-08 MED ORDER — TRAMADOL HCL 50 MG PO TABS: 50.0000 mg | ORAL_TABLET | Freq: Three times a day (TID) | ORAL | 1 refills | Status: DC | PRN

## 2024-09-08 NOTE — Progress Notes (Signed)
 INR low today.  Increase Coumadin  dose to 5 mg 2 days a week and a half a tab the other 5 days.  Repeat in 2 weeks.  Flu and COVID vaccine given today.  Questing refills on tramadol .  Meds ordered this encounter  Medications   traMADol  (ULTRAM ) 50 MG tablet    Sig: Take 1 tablet (50 mg total) by mouth every 8 (eight) hours as needed.    Dispense:  90 tablet    Refill:  1

## 2024-09-08 NOTE — Patient Instructions (Signed)
 Patient advised to change to taking 1 full tablet 5 mg tablet 2 times a wk on Saturday and Sundays. And 1/2 tab 2.5 mg the rest of the week RTC in 2 wks to recheck pt would also like his tramadol  refilled. Received his covid and his flu shot today

## 2024-09-18 ENCOUNTER — Telehealth: Payer: Self-pay | Admitting: Family Medicine

## 2024-09-18 NOTE — Telephone Encounter (Signed)
 Copied from CRM #[id number]. Topic: Appointments - Scheduling Inquiry for Clinic >> Sep 18, 2024 11:07 AM Leonette SQUIBB wrote: Reason for CRM: pt is coming in the office Tuesday for labs and he needs his legs wrapped.  He said he comes i n the office to get that done.  He is wanting to know if they can do it when he comes in.

## 2024-09-21 ENCOUNTER — Ambulatory Visit

## 2024-09-21 ENCOUNTER — Ambulatory Visit (INDEPENDENT_AMBULATORY_CARE_PROVIDER_SITE_OTHER): Admitting: Urgent Care

## 2024-09-21 ENCOUNTER — Encounter: Payer: Self-pay | Admitting: Urgent Care

## 2024-09-21 VITALS — BP 124/68 | HR 74 | Ht 74.0 in | Wt 319.0 lb

## 2024-09-21 DIAGNOSIS — I872 Venous insufficiency (chronic) (peripheral): Secondary | ICD-10-CM | POA: Diagnosis not present

## 2024-09-21 DIAGNOSIS — R600 Localized edema: Secondary | ICD-10-CM

## 2024-09-21 DIAGNOSIS — B353 Tinea pedis: Secondary | ICD-10-CM

## 2024-09-21 DIAGNOSIS — L97811 Non-pressure chronic ulcer of other part of right lower leg limited to breakdown of skin: Secondary | ICD-10-CM | POA: Diagnosis not present

## 2024-09-21 DIAGNOSIS — L03115 Cellulitis of right lower limb: Secondary | ICD-10-CM

## 2024-09-21 MED ORDER — LOTRIMIN AF 2 % EX AERP: 1.0000 | INHALATION_SPRAY | Freq: Every day | CUTANEOUS | 0 refills | Status: AC

## 2024-09-21 MED ORDER — CEPHALEXIN 500 MG PO CAPS: 500.0000 mg | ORAL_CAPSULE | Freq: Four times a day (QID) | ORAL | 0 refills | Status: AC

## 2024-09-21 NOTE — Patient Instructions (Addendum)
 Decrease your current coumadin  by 5mg  (meaning, skip one day a week). Please come back to recheck your INR in 2 weeks.  Start cephalexin  four times daily. Continue your lasix .  You can purchase an unna boot over the counter and change the bandage every 5 days.  Please follow up with the lymphedema clinic in [place].  [facility].  [address], [place], [place] [address].  Cassandra Dowtin, PT, CLT [email or web address].  [phone]  Please use the lotrimin  spray daily to your feet to help prevent recurrent cellulitis.

## 2024-09-21 NOTE — Progress Notes (Signed)
 Established Patient Office Visit  Subjective:  Patient ID: Jerome Hicks, male    DOB: 1951/05/24  Age: 73 y.o. MRN: [id number]  Chief Complaint  Patient presents with   Follow-up    Legs draining and coumadin  check    HPI  Discussed the use of AI scribe software for clinical note transcription with the patient, who gave verbal consent to proceed.  History of Present Illness   Jerome Hicks is a 73 year old male with chronic leg swelling and recurrent infections who presents for follow-up of leg wounds and lymphedema management.  He was last seen a month and a half ago for an open wound on his leg, which has since closed. However, he now has a new wound at the bottom of the same leg. He has been applying medicated Vaseline to the affected area.  He has a history of lymphedema and has not been following up with a lymphedema clinic. He was previously given a wrap for his leg, which he left on for four days before removing it and did not replace it. He is currently taking Lasix  (furosemide ) 40 mg daily, except on Saturdays and Sundays, to manage fluid retention. He avoids Lasix  on weekends due to increased activity and church attendance.  He is on warfarin for anticoagulation, taking a varying dose throughout the week, and reports an INR of 4.0, which is high despite not consuming greens. His warfarin regimen includes taking two tablets on Monday, Wednesday, and Friday, one tablet on Tuesday and Thursday, and none over the weekend.  He has previously been treated with Cefalexin for infections, which was effective in clearing up past infections. He has difficulty reaching his toes.      Patient Active Problem List   Diagnosis Date Noted   COPD (chronic obstructive pulmonary disease) (HCC) 12/25/2023   Elevated coronary artery calcium  score 06/05/2023   Encounter for weight management 12/06/2022   Encounter for chronic pain management 09/20/2022   BMI 38.0-38.9,adult 09/20/2022    Aortic root enlargement 07/24/2022   Aneurysm of ascending aorta without rupture 06/09/2022   Coronary artery disease due to calcified coronary lesion 03/10/2019   Total knee replacement status 08/02/2018   Thoracic aortic ectasia 02/06/2018   History of arthroplasty of right knee 10/21/2017   Glenohumeral arthritis of both shoulders 04/14/2015   Gilbert's disease 08/24/2014   Traumatic avulsion of nail plate of toe 91/93/7984   Hypogonadism male 02/26/2014   Lower leg edema 12/02/2013   Lymph node enlargement 12/02/2013   Venous stasis ulcer of other part of right lower leg limited to breakdown of skin without varicose veins (HCC) 12/01/2013   CKD (chronic kidney disease) stage 3, GFR 30-59 ml/min (HCC) 05/28/2013   Asbestosis (HCC) 01/12/2011   Restrictive lung disease 01/12/2011   Cardiac arrhythmia 05/26/2008   ERECTILE DYSFUNCTION 01/06/2008   Essential hypertension, benign 01/05/2008   Venous (peripheral) insufficiency 01/05/2008   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)    get yearly PFT's   Ascending aortic aneurysm    seen on 01-31-18 ct chest ; patient denies awareness   Atrial fibrillation (HCC)    Beta-hemolytic group B streptococcal sepsis (HCC) 12/01/2013   Formatting of this note might be different from the original.  Hospitalized from 11-14-2013 through 11-27-2013   Cataracts, bilateral    ED (erectile dysfunction)    has been prescribed Levitra  and Viagrain in the past   Hypertension  Internal hemorrhoids    Kidney stones    Leg edema    recurrent cellulitis   Scrotal abscess 01/2002   Shoulder arthritis    Sigmoid diverticulosis    Past Surgical History:  Procedure Laterality Date    MRSA infection in left groin requiring surgical debridement  12-08   CHOLECYSTECTOMY  1971   kidney stones  1990's   Ureteroscopic stone extraction   TONSILLECTOMY     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   Procedure: RIGHT TOTAL  KNEE ARTHROPLASTY;  Surgeon: Yvone Rush, MD;  Location: [facility];  Service: Orthopedics;  Laterality: Right;   Social History   Tobacco Use   Smoking status: Never   Smokeless tobacco: Former    Types: Chew    Quit date: 05/21/2021  Vaping Use   Vaping status: Never Used  Substance Use Topics   Alcohol use: No   Drug use: No      ROS: as noted in HPI  Objective:     BP 124/68   Pulse 74   Ht 6' 2 (1.88 m)   Wt (!) 319 lb (144.7 kg)   SpO2 95%   BMI 40.96 kg/m  BP Readings from Last 3 Encounters:  09/21/24 124/68  09/08/24 (!) 157/73  08/10/24 130/79   Wt Readings from Last 3 Encounters:  09/21/24 (!) 319 lb (144.7 kg)  08/10/24 (!) 313 lb (142 kg)  06/11/24 (!) 300 lb 0.6 oz (136.1 kg)      Physical Exam Vitals reviewed.  Constitutional:      General: He is not in acute distress.    Appearance: He is obese.  Cardiovascular:     Rate and Rhythm: Normal rate and regular rhythm.  Pulmonary:     Effort: Pulmonary effort is normal. No respiratory distress.  Musculoskeletal:     Right lower leg: Edema present.     Left lower leg: Edema present.  Skin:    General: Skin is warm.     Coloration: Skin is pale.     Findings: Erythema and rash (stasis dermatitis to bilateral shins; R leg draining) present.     Comments: RLE distally with cellulitis. R foot with significant onychomycosis and tinea pedis  Neurological:     Mental Status: He is alert and oriented to person, place, and time.  Psychiatric:        Mood and Affect: Mood normal.        Behavior: Behavior normal.      No results found for any visits on 09/21/24.   The ASCVD Risk score (Arnett DK, et al., 2019) failed to calculate for the following reasons:   The valid total cholesterol range is 130 to 320 mg/dL  Assessment & Plan:  Venous stasis ulcer of other part of right lower leg limited to breakdown of skin without varicose veins (HCC) -     Ambulatory referral to Physical Therapy  Lower  leg edema -     Ambulatory referral to Physical Therapy  Cellulitis of right lower leg -     Cephalexin ; Take 1 capsule (500 mg total) by mouth 4 (four) times daily for 7 days.  Dispense: 28 capsule; Refill: 0  Tinea pedis of right foot -     Lotrimin  AF; Apply 1 Application topically daily.  Dispense: 113 g; Refill: 0   Assessment and Plan    Chronic lymphedema of lower extremity with recurrent venous stasis ulcer and cellulitis Chronic lymphedema with recurrent venous stasis ulcer and cellulitis.  Previous Cefalexin effective, but infection recurs due to fluid retention. Medicated Vaseline and wrap used, but wrap only for four days. - Refer to lymphedema clinic at [facility] [address] [place]. - Prescribe additional medications for leg infection. - Wrap leg if supplies available.   Cellulitis Cephalexin  QID previously effective. Restart.  Tinea pedis Cracked and fissured skin may contribute to bacterial infections. Self-care complicated by inability to reach toes. - Prescribe spray for feet to apply without touching toes.  Chronic anticoagulation therapy (warfarin) management Chronic anticoagulation with warfarin. INR elevated at 4.  Take 5mg  daily x 6 days (in essence, skip one pill weekly for the next two weeks, then recheck)      Return in about 2 weeks (around 10/05/2024).   Benton LITTIE Gave, PA

## 2024-09-22 ENCOUNTER — Ambulatory Visit

## 2024-10-05 ENCOUNTER — Ambulatory Visit: Admitting: Family Medicine

## 2024-10-05 VITALS — BP 135/62 | HR 63 | Ht 74.0 in

## 2024-10-05 DIAGNOSIS — I482 Chronic atrial fibrillation, unspecified: Secondary | ICD-10-CM | POA: Diagnosis not present

## 2024-10-05 LAB — POCT INR: INR: 3.4 — AB (ref 2.0–3.0)

## 2024-10-05 NOTE — Progress Notes (Signed)
 See changes. Recheck in 2 weeks.

## 2024-10-20 ENCOUNTER — Ambulatory Visit (INDEPENDENT_AMBULATORY_CARE_PROVIDER_SITE_OTHER): Admitting: Family Medicine

## 2024-10-20 DIAGNOSIS — I482 Chronic atrial fibrillation, unspecified: Secondary | ICD-10-CM

## 2024-10-20 LAB — POCT INR: INR: 3.4 — AB (ref 2.0–3.0)

## 2024-10-20 MED ORDER — WARFARIN SODIUM 2.5 MG PO TABS: 2.5000 mg | ORAL_TABLET | Freq: Every evening | ORAL | 0 refills | Status: AC

## 2024-10-20 NOTE — Progress Notes (Signed)
 Will change tabs to the 2.5mg  dose everyday and then have him skip Sundays. Since the patient had alreadytaken 2.5 this morning. ( This lower dose by 2.5mg  for the week) he denies any intake of greens, etc or major diet chagnes.  Recheck in 14 days. Pt advised of this change. Also gave him a print out of his dosing schedule, also advised per Dr. Alvan to try to take medication in the evenings.

## 2024-11-03 ENCOUNTER — Ambulatory Visit: Admitting: Family Medicine

## 2024-11-03 VITALS — BP 137/78 | HR 80 | Resp 18 | Ht 74.0 in

## 2024-11-03 DIAGNOSIS — I482 Chronic atrial fibrillation, unspecified: Secondary | ICD-10-CM

## 2024-11-03 LAB — POCT INR: INR: 2.1 (ref 2.0–3.0)

## 2024-11-03 NOTE — Progress Notes (Signed)
 Agree with documentation as above.   Nani Gasser, MD

## 2024-11-04 ENCOUNTER — Encounter: Payer: Self-pay | Admitting: Family Medicine

## 2024-11-07 ENCOUNTER — Other Ambulatory Visit: Payer: Self-pay | Admitting: Family Medicine

## 2024-11-07 DIAGNOSIS — I10 Essential (primary) hypertension: Secondary | ICD-10-CM

## 2024-11-16 ENCOUNTER — Other Ambulatory Visit: Payer: Self-pay | Admitting: Family Medicine

## 2024-11-16 DIAGNOSIS — Z96651 Presence of right artificial knee joint: Secondary | ICD-10-CM

## 2024-11-16 NOTE — Telephone Encounter (Unsigned)
 Copied from CRM #[id number]. Topic: Clinical - Medication Refill >> Nov 16, 2024  1:01 PM Dedra B wrote: Medication:  traMADol  (ULTRAM ) 50 MG tablet   Has the patient contacted their pharmacy? Yes, no refills  This is the patient's preferred pharmacy:  [facility] [address], [place] - [address] [place] [address] Phone: [phone] Fax: [phone]  Is this the correct pharmacy for this prescription? Yes  Has the prescription been filled recently? No  Is the patient out of the medication? Yes  Has the patient been seen for an appointment in the last year OR does the patient have an upcoming appointment? Yes  Can we respond through MyChart? Yes  Agent: Please be advised that Rx refills may take up to 3 business days. We ask that you follow-up with your pharmacy.

## 2024-11-17 ENCOUNTER — Ambulatory Visit: Admitting: Family Medicine

## 2024-11-17 DIAGNOSIS — Z96651 Presence of right artificial knee joint: Secondary | ICD-10-CM

## 2024-11-17 LAB — POCT INR: INR: 1.6 — AB (ref 2.0–3.0)

## 2024-11-17 MED ORDER — TRAMADOL HCL 50 MG PO TABS: 50.0000 mg | ORAL_TABLET | Freq: Three times a day (TID) | ORAL | 1 refills | Status: AC | PRN

## 2024-11-17 NOTE — Telephone Encounter (Signed)
 TRAMADOL  Last OV: 09/21/24 Next OV: No appt scheduled Last RF: 09/08/24

## 2024-12-02 ENCOUNTER — Ambulatory Visit (INDEPENDENT_AMBULATORY_CARE_PROVIDER_SITE_OTHER): Admitting: Family Medicine

## 2024-12-02 VITALS — BP 138/78 | HR 70 | Resp 18 | Ht 74.0 in | Wt 319.0 lb

## 2024-12-02 DIAGNOSIS — I482 Chronic atrial fibrillation, unspecified: Secondary | ICD-10-CM | POA: Diagnosis not present

## 2024-12-02 LAB — POCT INR: INR: 2.7 (ref 2.0–3.0)

## 2024-12-02 NOTE — Progress Notes (Signed)
" ° °  Subjective:    Patient ID: Jerome Hicks, male    DOB: Jan 24, 1951, 73 y.o.   MRN: [id number]  HPI    Review of Systems     Objective:   Physical Exam        Assessment & Plan:    "

## 2024-12-23 ENCOUNTER — Ambulatory Visit: Admitting: Family Medicine

## 2024-12-23 VITALS — BP 131/72 | HR 66 | Resp 18 | Ht 74.0 in | Wt 319.0 lb

## 2024-12-23 DIAGNOSIS — Z741 Need for assistance with personal care: Secondary | ICD-10-CM

## 2024-12-23 DIAGNOSIS — I482 Chronic atrial fibrillation, unspecified: Secondary | ICD-10-CM

## 2024-12-23 LAB — POCT INR: INR: 2.3 (ref 2.0–3.0)

## 2024-12-24 ENCOUNTER — Telehealth: Payer: Self-pay | Admitting: Family Medicine

## 2024-12-24 NOTE — Progress Notes (Signed)
 Care Guide Pharmacy Note  12/24/2024 Name: Jerome Hicks MRN: [id number] DOB: September 12, 1951  Referred By: Alvan Dorothyann BIRCH, MD Reason for referral: Complex Care Management and Call Attempt #1 (Outreach to sch ref w/ pharm/)   Jerome Hicks is a 74 y.o. year old male who is a primary care patient of Alvan Dorothyann BIRCH, MD.  Jerome Hicks was referred to the pharmacist for assistance related to: managing meds  Successful contact was made with the patient to discuss pharmacy services including being ready for the pharmacist to call at least 5 minutes before the scheduled appointment time and to have medication bottles and any blood pressure readings ready for review. The patient agreed to meet with the pharmacist via telephone visit on (date/time).  Doyce Razor [facility], [facility] Guide Direct Dial: [phone]  Fax: [phone]

## 2025-01-12 ENCOUNTER — Other Ambulatory Visit

## 2025-01-25 ENCOUNTER — Ambulatory Visit

## 2025-03-11 ENCOUNTER — Ambulatory Visit
